# Patient Record
Sex: Female | Born: 1995 | Race: Black or African American | Hispanic: No | Marital: Single | Smoking: Never smoker
Health system: Southern US, Community
[De-identification: ages and names within clinical notes are randomized; demographics above are authoritative.]

## PROBLEM LIST (undated history)

## (undated) ENCOUNTER — Inpatient Hospital Stay (HOSPITAL_COMMUNITY): Payer: Self-pay

## (undated) DIAGNOSIS — L83 Acanthosis nigricans: Secondary | ICD-10-CM

## (undated) DIAGNOSIS — K219 Gastro-esophageal reflux disease without esophagitis: Secondary | ICD-10-CM

## (undated) DIAGNOSIS — O139 Gestational [pregnancy-induced] hypertension without significant proteinuria, unspecified trimester: Secondary | ICD-10-CM

## (undated) DIAGNOSIS — O24419 Gestational diabetes mellitus in pregnancy, unspecified control: Secondary | ICD-10-CM

## (undated) DIAGNOSIS — E669 Obesity, unspecified: Secondary | ICD-10-CM

## (undated) DIAGNOSIS — G43909 Migraine, unspecified, not intractable, without status migrainosus: Secondary | ICD-10-CM

## (undated) DIAGNOSIS — M419 Scoliosis, unspecified: Secondary | ICD-10-CM

## (undated) DIAGNOSIS — R7303 Prediabetes: Secondary | ICD-10-CM

## (undated) DIAGNOSIS — G971 Other reaction to spinal and lumbar puncture: Secondary | ICD-10-CM

## (undated) DIAGNOSIS — Z9109 Other allergy status, other than to drugs and biological substances: Secondary | ICD-10-CM

## (undated) DIAGNOSIS — E119 Type 2 diabetes mellitus without complications: Secondary | ICD-10-CM

## (undated) DIAGNOSIS — I10 Essential (primary) hypertension: Secondary | ICD-10-CM

## (undated) DIAGNOSIS — D1620 Benign neoplasm of long bones of unspecified lower limb: Secondary | ICD-10-CM

## (undated) DIAGNOSIS — E049 Nontoxic goiter, unspecified: Secondary | ICD-10-CM

## (undated) HISTORY — DX: Gastro-esophageal reflux disease without esophagitis: K21.9

## (undated) HISTORY — DX: Gestational (pregnancy-induced) hypertension without significant proteinuria, unspecified trimester: O13.9

## (undated) HISTORY — DX: Prediabetes: R73.03

## (undated) HISTORY — DX: Gestational diabetes mellitus in pregnancy, unspecified control: O24.419

## (undated) HISTORY — DX: Acanthosis nigricans: L83

## (undated) HISTORY — DX: Nontoxic goiter, unspecified: E04.9

## (undated) HISTORY — DX: Type 2 diabetes mellitus without complications: E11.9

## (undated) HISTORY — DX: Essential (primary) hypertension: I10

## (undated) HISTORY — DX: Migraine, unspecified, not intractable, without status migrainosus: G43.909

## (undated) HISTORY — DX: Obesity, unspecified: E66.9

## (undated) HISTORY — DX: Scoliosis, unspecified: M41.9

## (undated) HISTORY — PX: OSTEOCHONDROMA EXCISION: SHX2137

## (undated) HISTORY — DX: Other reaction to spinal and lumbar puncture: G97.1

---
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
## 2013-01-06 ENCOUNTER — Other Ambulatory Visit: Payer: Self-pay | Admitting: Pediatric Endocrinology

## 2013-01-06 ENCOUNTER — Ambulatory Visit: Payer: BC Managed Care – PPO | Admitting: Pediatric Endocrinology

## 2013-01-15 ENCOUNTER — Ambulatory Visit (INDEPENDENT_AMBULATORY_CARE_PROVIDER_SITE_OTHER): Payer: Medicaid Other | Admitting: Family Medicine

## 2013-01-15 ENCOUNTER — Encounter: Payer: Self-pay | Admitting: Family Medicine

## 2013-01-15 VITALS — BP 120/78 | HR 92 | Temp 98.7°F | Wt 209.0 lb

## 2013-01-15 DIAGNOSIS — J029 Acute pharyngitis, unspecified: Secondary | ICD-10-CM

## 2013-01-15 DIAGNOSIS — N898 Other specified noninflammatory disorders of vagina: Secondary | ICD-10-CM

## 2013-01-15 LAB — POCT WET PREP (WET MOUNT): Clue Cells Wet Prep Whiff POC: NEGATIVE

## 2013-01-15 LAB — POCT RAPID STREP A (OFFICE): Rapid Strep A Screen: NEGATIVE

## 2013-01-15 MED ORDER — LORATADINE 10 MG PO TABS: 10.0000 mg | ORAL_TABLET | Freq: Every day | ORAL | Status: DC

## 2013-01-15 MED ORDER — MICONAZOLE NITRATE 2 % VA CREA: 1.0000 | TOPICAL_CREAM | Freq: Every day | VAGINAL | Status: DC

## 2013-01-15 MED ORDER — MICONAZOLE NITRATE 2 % VA CREA: TOPICAL_CREAM | VAGINAL | Status: DC

## 2013-01-15 NOTE — Patient Instructions (Addendum)
It has been a pleasure to see you today.  Please take the medications as prescribed. I will call you with the labs results if they come back abnormal otherwise you will receive a letter. Make a follow appointment as needed.

## 2013-01-15 NOTE — Progress Notes (Signed)
Family Medicine Office Visit Note   Subjective:   Patient ID: Leslie Skinner, female  DOB: Nov 28, 1995, 17 y.o.. MRN: [id number]   Pt that comes today for skin the appointment complaining of vaginal discharge or sore throat.  She's not sexually active per patient's statement but has been on Depo Provera.   Strep throat: Started a couple of days ago, she reports rhinorrhea and congestion, denies fever, chills, malaise, nausea or vomiting. Also denies cough shortness of breath or other lower respiratory symptoms.  Vaginal discharge: Patient has been noticing brown vaginal discharge present on her underwear, ulcer reports burning sensation itchiness and bad odor. Denies dysuria, abdominal/pelvic pain, frequency or flank pain.  Review of Systems:  Per HPI  Objective:   Physical Exam: Gen:  NAD HEENT: Moist mucous membranes Oropharynx: no exudates, mildly erythematous. Neck: supple no adenopathies. Ears with normal TM and ear canal.  CV: Regular rate and rhythm, no murmurs  PULM: Clear to auscultation bilaterally.  ABD: Soft, non tender, non distended, normal bowel sounds EXT: No edema Skin: no rashes.  Vulva and perianal area: Normal, vaginal discharge seen.  Speculum and bimanual exam differed.   Assessment & Plan:

## 2013-01-20 DIAGNOSIS — J029 Acute pharyngitis, unspecified: Secondary | ICD-10-CM | POA: Insufficient documentation

## 2013-01-20 NOTE — Assessment & Plan Note (Signed)
Wet prep obtained form outside vaginal introitus. Negative results No further treatment. Recommended good hygiene.  F/u as needed.

## 2013-01-20 NOTE — Assessment & Plan Note (Signed)
Most likely from viral illness. No exudates, adenopathies or fever. Rapid Strep negative. Symptomatic treatment.  Discussed signs of worsening condition that should prompt re-evaluation. F/u as needed.

## 2013-01-26 ENCOUNTER — Encounter (HOSPITAL_COMMUNITY): Payer: Self-pay | Admitting: Emergency Medicine

## 2013-01-26 ENCOUNTER — Emergency Department (HOSPITAL_COMMUNITY): Payer: Medicaid Other

## 2013-01-26 ENCOUNTER — Emergency Department (HOSPITAL_COMMUNITY)
Admission: EM | Admit: 2013-01-26 | Discharge: 2013-01-27 | Disposition: A | Discharge status: Home / Self Care | Payer: Medicaid Other | Attending: Emergency Medicine | Admitting: Emergency Medicine

## 2013-01-26 DIAGNOSIS — M4120 Other idiopathic scoliosis, site unspecified: Secondary | ICD-10-CM | POA: Insufficient documentation

## 2013-01-26 DIAGNOSIS — W540XXA Bitten by dog, initial encounter: Secondary | ICD-10-CM | POA: Insufficient documentation

## 2013-01-26 DIAGNOSIS — E669 Obesity, unspecified: Secondary | ICD-10-CM | POA: Insufficient documentation

## 2013-01-26 DIAGNOSIS — Z8739 Personal history of other diseases of the musculoskeletal system and connective tissue: Secondary | ICD-10-CM | POA: Insufficient documentation

## 2013-01-26 DIAGNOSIS — L83 Acanthosis nigricans: Secondary | ICD-10-CM | POA: Insufficient documentation

## 2013-01-26 DIAGNOSIS — Z8719 Personal history of other diseases of the digestive system: Secondary | ICD-10-CM | POA: Insufficient documentation

## 2013-01-26 DIAGNOSIS — E049 Nontoxic goiter, unspecified: Secondary | ICD-10-CM | POA: Insufficient documentation

## 2013-01-26 DIAGNOSIS — S61451A Open bite of right hand, initial encounter: Secondary | ICD-10-CM

## 2013-01-26 DIAGNOSIS — Z8669 Personal history of other diseases of the nervous system and sense organs: Secondary | ICD-10-CM | POA: Insufficient documentation

## 2013-01-26 DIAGNOSIS — E1169 Type 2 diabetes mellitus with other specified complication: Secondary | ICD-10-CM | POA: Insufficient documentation

## 2013-01-26 DIAGNOSIS — Z885 Allergy status to narcotic agent status: Secondary | ICD-10-CM | POA: Insufficient documentation

## 2013-01-26 DIAGNOSIS — Y929 Unspecified place or not applicable: Secondary | ICD-10-CM | POA: Insufficient documentation

## 2013-01-26 DIAGNOSIS — Z79899 Other long term (current) drug therapy: Secondary | ICD-10-CM | POA: Insufficient documentation

## 2013-01-26 DIAGNOSIS — K219 Gastro-esophageal reflux disease without esophagitis: Secondary | ICD-10-CM | POA: Insufficient documentation

## 2013-01-26 DIAGNOSIS — I10 Essential (primary) hypertension: Secondary | ICD-10-CM | POA: Insufficient documentation

## 2013-01-26 DIAGNOSIS — IMO0002 Reserved for concepts with insufficient information to code with codable children: Secondary | ICD-10-CM | POA: Insufficient documentation

## 2013-01-26 DIAGNOSIS — Y939 Activity, unspecified: Secondary | ICD-10-CM | POA: Insufficient documentation

## 2013-01-26 MED ORDER — IBUPROFEN 400 MG PO TABS
400.0000 mg | ORAL_TABLET | Freq: Once | ORAL | Status: DC
  Filled 2013-01-26: qty 1

## 2013-01-26 MED ORDER — ACETAMINOPHEN 325 MG PO TABS
650.0000 mg | ORAL_TABLET | Freq: Once | ORAL | Status: AC
  Administered 2013-01-26: 650 mg via ORAL
  Filled 2013-01-26: qty 2

## 2013-01-26 NOTE — ED Notes (Signed)
Pt. reported that she was bitten by a stray dog this morning , presents with swelling/bite mark at right hand .
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
## 2013-01-27 NOTE — ED Provider Notes (Signed)
Medical screening examination/treatment/procedure(s) were performed by non-physician practitioner and as supervising physician I was immediately available for consultation/collaboration.  EKG Interpretation   None        Martha K Linker, MD 01/27/13 1703 

## 2013-01-29 ENCOUNTER — Encounter: Payer: Self-pay | Admitting: Emergency Medicine

## 2013-02-20 ENCOUNTER — Emergency Department (HOSPITAL_COMMUNITY)
Admission: EM | Admit: 2013-02-20 | Discharge: 2013-02-21 | Disposition: A | Discharge status: Home / Self Care | Payer: Medicaid Other | Attending: Emergency Medicine | Admitting: Emergency Medicine

## 2013-02-20 ENCOUNTER — Encounter (HOSPITAL_COMMUNITY): Payer: Self-pay | Admitting: Emergency Medicine

## 2013-02-20 DIAGNOSIS — Z8739 Personal history of other diseases of the musculoskeletal system and connective tissue: Secondary | ICD-10-CM | POA: Insufficient documentation

## 2013-02-20 DIAGNOSIS — Z862 Personal history of diseases of the blood and blood-forming organs and certain disorders involving the immune mechanism: Secondary | ICD-10-CM | POA: Insufficient documentation

## 2013-02-20 DIAGNOSIS — Z3202 Encounter for pregnancy test, result negative: Secondary | ICD-10-CM | POA: Insufficient documentation

## 2013-02-20 DIAGNOSIS — I10 Essential (primary) hypertension: Secondary | ICD-10-CM | POA: Insufficient documentation

## 2013-02-20 DIAGNOSIS — G43909 Migraine, unspecified, not intractable, without status migrainosus: Secondary | ICD-10-CM | POA: Insufficient documentation

## 2013-02-20 DIAGNOSIS — Z87828 Personal history of other (healed) physical injury and trauma: Secondary | ICD-10-CM | POA: Insufficient documentation

## 2013-02-20 DIAGNOSIS — Z872 Personal history of diseases of the skin and subcutaneous tissue: Secondary | ICD-10-CM | POA: Insufficient documentation

## 2013-02-20 DIAGNOSIS — E119 Type 2 diabetes mellitus without complications: Secondary | ICD-10-CM | POA: Insufficient documentation

## 2013-02-20 DIAGNOSIS — E860 Dehydration: Secondary | ICD-10-CM | POA: Insufficient documentation

## 2013-02-20 DIAGNOSIS — E669 Obesity, unspecified: Secondary | ICD-10-CM | POA: Insufficient documentation

## 2013-02-20 DIAGNOSIS — Z79899 Other long term (current) drug therapy: Secondary | ICD-10-CM | POA: Insufficient documentation

## 2013-02-20 DIAGNOSIS — R42 Dizziness and giddiness: Secondary | ICD-10-CM

## 2013-02-20 DIAGNOSIS — K219 Gastro-esophageal reflux disease without esophagitis: Secondary | ICD-10-CM | POA: Insufficient documentation

## 2013-02-20 DIAGNOSIS — Y849 Medical procedure, unspecified as the cause of abnormal reaction of the patient, or of later complication, without mention of misadventure at the time of the procedure: Secondary | ICD-10-CM | POA: Insufficient documentation

## 2013-02-20 DIAGNOSIS — Z8639 Personal history of other endocrine, nutritional and metabolic disease: Secondary | ICD-10-CM | POA: Insufficient documentation

## 2013-02-20 MED ORDER — ACETAMINOPHEN 325 MG PO TABS
650.0000 mg | ORAL_TABLET | Freq: Once | ORAL | Status: AC
  Administered 2013-02-20: 650 mg via ORAL
  Filled 2013-02-20: qty 2

## 2013-02-20 MED ORDER — SODIUM CHLORIDE 0.9 % IV BOLUS (SEPSIS)
1000.0000 mL | Freq: Once | INTRAVENOUS | Status: AC
  Administered 2013-02-20: 1000 mL via INTRAVENOUS

## 2013-02-20 NOTE — ED Notes (Signed)
Pt informed that we need urine, pt sts she can't go right now

## 2013-02-20 NOTE — ED Notes (Signed)
Bed: WA01 Expected date:  Expected time:  Means of arrival:  Comments: EMS/17 yo with weakness-donated plasma earlier today
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
## 2013-02-20 NOTE — ED Notes (Signed)
Donated plasma at 1300 today, felt weak after the donation, went home didn't tell anybody, didn't really eat or drink much. Now feeling dizzy, weakness, and nauseous. Hx of DMT1 and HTN. EMS BP was 120/66 and EMS CBG was 95. Pt is stable

## 2013-02-21 LAB — URINALYSIS, ROUTINE W REFLEX MICROSCOPIC
Bilirubin Urine: NEGATIVE
Hgb urine dipstick: NEGATIVE
Specific Gravity, Urine: 1.011 (ref 1.005–1.030)
pH: 6 (ref 5.0–8.0)

## 2013-02-21 NOTE — ED Provider Notes (Signed)
Medical screening examination/treatment/procedure(s) were performed by non-physician practitioner and as supervising physician I was immediately available for consultation/collaboration.  EKG Interpretation   None         Christopher J. Pollina, MD 02/21/13 1513 

## 2013-02-28 ENCOUNTER — Other Ambulatory Visit: Payer: Self-pay | Admitting: "Endocrinology

## 2013-03-09 ENCOUNTER — Other Ambulatory Visit: Payer: Self-pay | Admitting: *Deleted

## 2013-03-09 DIAGNOSIS — I10 Essential (primary) hypertension: Secondary | ICD-10-CM

## 2013-03-09 MED ORDER — LISINOPRIL 10 MG PO TABS: 10.0000 mg | ORAL_TABLET | Freq: Every day | ORAL | Status: DC

## 2013-03-19 ENCOUNTER — Ambulatory Visit (INDEPENDENT_AMBULATORY_CARE_PROVIDER_SITE_OTHER): Payer: Medicaid Other | Admitting: Family Medicine

## 2013-03-19 ENCOUNTER — Ambulatory Visit (INDEPENDENT_AMBULATORY_CARE_PROVIDER_SITE_OTHER): Payer: Medicaid Other | Admitting: *Deleted

## 2013-03-19 VITALS — BP 126/73 | HR 86 | Temp 99.2°F | Ht 62.0 in | Wt 209.0 lb

## 2013-03-19 DIAGNOSIS — IMO0001 Reserved for inherently not codable concepts without codable children: Secondary | ICD-10-CM

## 2013-03-19 DIAGNOSIS — J029 Acute pharyngitis, unspecified: Secondary | ICD-10-CM

## 2013-03-19 DIAGNOSIS — Z309 Encounter for contraceptive management, unspecified: Secondary | ICD-10-CM

## 2013-03-19 DIAGNOSIS — M542 Cervicalgia: Secondary | ICD-10-CM

## 2013-03-19 DIAGNOSIS — E669 Obesity, unspecified: Secondary | ICD-10-CM

## 2013-03-19 LAB — POCT URINE PREGNANCY: Preg Test, Ur: NEGATIVE

## 2013-03-19 MED ORDER — MEDROXYPROGESTERONE ACETATE 150 MG/ML IM SUSP
150.0000 mg | Freq: Once | INTRAMUSCULAR | Status: AC
  Administered 2013-03-19: 150 mg via INTRAMUSCULAR

## 2013-03-19 MED ORDER — IPRATROPIUM BROMIDE 0.06 % NA SOLN: 2.0000 | Freq: Four times a day (QID) | NASAL | Status: DC

## 2013-03-19 NOTE — Assessment & Plan Note (Signed)
Subacute (2 weeks), left sided; Pt's vitals are stable, she is afebrile, and no stridor or upper respiratory obstruction was noted on exam. She had full neck ROM with no stiffness. Symptoms most severe at left trapezius with tenderness and difficulty shrugging shoulder, with no trauma but reported increased lifting load recently at work. Likely MSK strain, throat pain likely URI. Mild occasional numbness limited to shoulder. - Recommended ibuprofen 4-600mg  3-4 times daily with food. - Rest, ice/heat therapy, provided note to minimize lifting at work x1 week. - Return precautions reviewed. - F/u in 2 week if no better

## 2013-03-19 NOTE — Assessment & Plan Note (Signed)
Likely contributing to difficulty breathing at night that seems mild, not waking pt from sleep. - F/u with PCP. - Suggested sleep study but deferring to PCP. - Recommended weight loss for DM and HTN. - Sleep with 1 extra pillow to minimize pressure from redundant o/p tissue.

## 2013-03-19 NOTE — Progress Notes (Signed)
Patient in today for depo, patient a few weeks late so urine pregnancy test obtained with negative results. Injection given in the right ventrogluteal, patient without complaints, site unremarkable. Next depo due March 26 - April 9, patient aware.

## 2013-03-19 NOTE — Assessment & Plan Note (Signed)
With mild symptoms of cough and sneeze, increased rhinorrhea, and throat pain, likely URI vs allergic symptoms. No hot potato voice, no LAD, no drooling. - Chrloraseptic or atrovent nasal spray for postnasal drip PRN. - Return precautions reviewed especially difficulty breathing. - F/u 1 week if no better.

## 2013-03-19 NOTE — Patient Instructions (Addendum)
It was good to meet you today.  Your symptoms may be due to both straining at work and having a cold.  - You can try atrovent nasal spray for postnasal drip symtoms. - You can try chloraseptic throat spray as well for sore throat. - If you start having difficulty breathing, high fevers, or worsened symptoms, seek immediate care. - Follow up if you are not feeling better in 1 week. - Try 1 extra pillow to help with breathing symptoms at night. - Weight loss will also help as there may be more tissue around the neck. - I recommend a sleep study to evaluate for sleep apnea. Discuss this with your PCP. - F/u with Dr Bridgett Larsson to follow up on your high BP and diabetes. - Try to take it easy at work. - Take ibuprofen 4-600mg  3-4 times daily with food for your shoulder pain and rest. Follow up in 2-3 weeks.  Muscle Strain Muscle strain occurs when a muscle is stretched beyond its normal length. A small number of muscle fibers generally are torn. This is especially common in athletes. This happens when a sudden, violent force placed on a muscle stretches it too far. Usually, recovery from muscle strain takes 1 to 2 weeks. Complete healing will take 5 to 6 weeks.  HOME CARE INSTRUCTIONS   While awake, apply ice to the sore muscle for the first 2 days after the injury.  Put ice in a plastic bag.  Place a towel between your skin and the bag.  Leave the ice on for 15-20 minutes each hour.  Do not use the strained muscle for several days, until you no longer have pain.  You may wrap the injured area with an elastic bandage for comfort. Be careful not to wrap it too tightly. This may interfere with blood circulation or increase swelling.  Only take over-the-counter or prescription medicines for pain, discomfort, or fever as directed by your caregiver. SEEK MEDICAL CARE IF:  You have increasing pain or swelling in the injured area. MAKE SURE YOU:   Understand these instructions.  Will watch your  condition.  Will get help right away if you are not doing well or get worse. Document Released: 02/26/2005 Document Revised: 05/21/2011 Document Reviewed: 03/10/2011 [facility] Patient Information 2014 [place], [place].

## 2013-03-19 NOTE — Progress Notes (Signed)
Patient ID: Leslie Skinner, female   DOB: 1995/03/27, 18 y.o.   MRN: [id number] Subjective:   CC: Swollen glands  HPI:   Patient presents today to same-day clinic with complaint of swollen glands, throat pain, and trouble swallowing for 2 weeks. She is able to tolerate PO and is not drooling. She also reports that her right eyebrow occasionally swells and this has been going on a few weeks too. 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 She has also had increased rhinorrhea lately and complains of allergies acting up. She has some coughing/sneezing and at night has a hard time breathing. She has not seen anyone for this complaint.  She denies fevers, chills, stiff neck, purlence/pus, change over the past 2 weeks in symptoms, shortness of breath, waking up unable to breathe, rash, nausea, vomiting, diarrhea, chest pain, or sick contacts. She has also not recently tried new foods or had new medications.  Review of Systems - Per HPI.   PMH: - HTN - DM - Acid reflux - Allergies  SH: - works at [employer] doing heavy lifting  Meds: - Loratadine - Lisinopril - Metformin - "Acid reflux medications"    Objective:  Physical Exam BP 126/73  Pulse 86  Temp(Src) 99.2 F (37.3 C) (Oral)  Ht 5\' 2"  (1.575 m)  Wt 209 lb (94.802 kg)  BMI 38.22 kg/m2 GEN: NAD HEENT: Atraumatic, normocephalic, neck supple, EOMI, sclera clear, propharynx clear, no swelling or tonsillar hypertrophy, no exudate, no stridor, no drooling, voice mildly husky-sounding Lymph: No anterior/posterior cervical, submandibular or submental LAD, mild tenderness anterior throat, no fullness PULM: CTAB, normal effort ABD: Obese SKIN: No rash or cyanosis; warm and well-perfused EXTR: No lower extremity edema or calf tenderness; Left upper trapezius tender to palpation and attempted shrugging of  shoulders; no erythema, swelling, deformity. Strength upper extremities 5/5 with full shoulder abduction. PSYCH: Mood and affect euthymic, normal rate and volume of speech NEURO: Awake, alert, no focal deficits grossly, normal speech, CN 2-12 intact grossly  Assessment:     Leslie Skinner is a 18 y.o. female here for "swollen glands."    Plan:     # See problem list and after visit summary for problem-specific plans.   # Health Maintenance: Urged patient to return for follow-up of DM and HTN regularly with Leslie Skinner.   Follow-up: Follow up in 1 week if symptoms are not improving.   Leslie Sinclair, MD [place]

## 2013-03-30 ENCOUNTER — Other Ambulatory Visit: Payer: Self-pay | Admitting: Pediatric Endocrinology

## 2013-04-04 ENCOUNTER — Other Ambulatory Visit: Payer: Self-pay | Admitting: Emergency Medicine

## 2013-05-03 ENCOUNTER — Other Ambulatory Visit: Payer: Self-pay | Admitting: Pediatric Endocrinology

## 2013-05-17 ENCOUNTER — Encounter (HOSPITAL_COMMUNITY): Payer: Self-pay | Admitting: Emergency Medicine

## 2013-05-17 ENCOUNTER — Emergency Department (INDEPENDENT_AMBULATORY_CARE_PROVIDER_SITE_OTHER): Payer: Medicaid Other

## 2013-05-17 ENCOUNTER — Emergency Department (INDEPENDENT_AMBULATORY_CARE_PROVIDER_SITE_OTHER)
Admission: EM | Admit: 2013-05-17 | Discharge: 2013-05-17 | Disposition: A | Discharge status: Home / Self Care | Payer: Medicaid Other | Source: Home / Self Care | Attending: Emergency Medicine | Admitting: Emergency Medicine

## 2013-05-17 DIAGNOSIS — S6390XA Sprain of unspecified part of unspecified wrist and hand, initial encounter: Secondary | ICD-10-CM

## 2013-05-17 DIAGNOSIS — S63601A Unspecified sprain of right thumb, initial encounter: Secondary | ICD-10-CM

## 2013-05-17 DIAGNOSIS — T23019A Burn of unspecified degree of unspecified thumb (nail), initial encounter: Secondary | ICD-10-CM

## 2013-05-17 DIAGNOSIS — T23011A Burn of unspecified degree of right thumb (nail), initial encounter: Secondary | ICD-10-CM

## 2013-05-17 MED ORDER — NAPROXEN 500 MG PO TABS: 500.0000 mg | ORAL_TABLET | Freq: Two times a day (BID) | ORAL | Status: DC

## 2013-05-17 MED ORDER — CAPSAICIN 0.025 % EX CREA: TOPICAL_CREAM | Freq: Two times a day (BID) | CUTANEOUS | Status: DC

## 2013-05-17 NOTE — ED Provider Notes (Signed)
Chief Complaint    Chief Complaint  Patient presents with  . Hand Problem    History of Present Illness     Leslie Skinner is a 18 year old female who 2 weeks ago while at work. The tip of her right thumb on a hot glue gun. She jerked the thumb away from the glue gun and noted a popping sensation at the MCP joint. She's been treating the burn herself at home when it is completely healed, but the tip of the finger still swollen, painful, tender, numb, and tingly, and burns. She also has aching over the inter phalangeal and MCP joints. There is no swelling of these joints. She has a limited range of motion with pain.  Review of Systems     Other than as noted above, the patient denies any of the following symptoms: Systemic:  No fevers, chills, sweats, or muscle aches.  No weight loss.  Musculoskeletal:  No joint pain, arthritis, bursitis, swelling, back pain, or neck pain. Neurological:  No muscular weakness, paresthesias, headache, or trouble with speech or coordination.  No dizziness.  [place]    Past medical history, family history, social history, meds, and allergies were reviewed.  She is allergic to morphine. She has diabetes, hypertension, GERD, headaches, and lower back pain. She takes metformin, Topamax, lisinopril, naproxen, Urecholine, and Prevacid.  Physical Exam    Vital signs:  BP 140/86  Pulse 95  Temp(Src) 98.7 F (37.1 C) (Oral)  Resp 18  SpO2 100% Gen:  Alert and oriented times 3.  In no distress. Musculoskeletal: Exam of the hand reveals swelling of the tip of the thumb. The burn is completely healed up. It's still tender to touch. The [facility] PIP joint have a decreased range of motion with pain. There is pain to palpation of both of these joints. She doesn't have much muscle strength in that hand.  Otherwise, all joints had a full a ROM with no swelling, bruising or deformity.  No edema, pulses full. Extremities were warm and pink.  Capillary refill was brisk.  Skin:   Clear, warm and dry.  No rash. Neuro:  Alert and oriented times 3.  Muscle strength was normal.  Sensation was intact to light touch.    Radiology     Dg Finger Thumb Right  05/17/2013   CLINICAL DATA:  Right thumb burn.  EXAM: RIGHT THUMB 2+V  COMPARISON:  None.  FINDINGS: There is no evidence of fracture or dislocation. There is no evidence of arthropathy or other focal bone abnormality. Soft tissues are unremarkable  IMPRESSION: Negative.   Electronically Signed   By: Rolm Baptise M.D.   On: 05/17/2013 12:53   I reviewed the images independently and personally and concur with the radiologist's findings.  Course in [facility]gent [place] in a thumb spica splint.  Assessment    The primary encounter diagnosis was Burn of right thumb. A diagnosis of Sprain of right thumb was also pertinent to this visit.  The burn is completely healed up, but there seems to be some residual inflammation. I have recommended that she use capsaicin cream, since some of this may be neuropathic pain. I've advised that she followup with a hand specialist. There's a possibility that she has a radial or ulnar collateral ligament sprain or tear. Examination was difficult today because she was in so much pain.  Plan   1.  Meds:  The following meds were prescribed:   Discharge Medication List as of  05/17/2013  1:17 PM    START taking these medications   Details  capsaicin (ZOSTRIX) 0.025 % cream Apply topically 2 (two) times daily., Starting 05/17/2013, Until Discontinued, Normal    naproxen (NAPROSYN) 500 MG tablet Take 1 tablet (500 mg total) by mouth 2 (two) times daily., Starting 05/17/2013, Until Discontinued, Normal        2.  Patient Education/Counseling:  The patient was given appropriate handouts, self care instructions, and instructed in symptomatic relief, including rest and activity, elevation, application of ice and compression.    3.  Follow up:  The patient was told to follow up here if no  better in 3 to 4 days, or sooner if becoming worse in any way, and given some red flag symptoms such as worsening pain or new neurological symptoms which would prompt immediate return.  Follow up here as needed. Followup with Dr. Lenon Curt in 2 weeks.     Harden Mo, MD 05/17/13 [phone]

## 2013-05-17 NOTE — Discharge Instructions (Signed)
Thumb Sprain Your exam shows you have a sprained thumb. This means the ligaments around the joint have been torn. Thumb sprains usually take 3-6 weeks to heal. However, severe, unstable sprains may need to be fixed surgically. Sometimes a small piece of bone is pulled off by the ligament. If this is not treated properly, a sprained thumb can lead to a painful, weak joint. Treatment helps reduce pain and shortens the period of disability. The thumb, and often the wrist, must remain splinted for the first 2-4 weeks to protect the joint. Keep your hand elevated and apply ice packs frequently to the injured area (20-30 minutes every 2-3 hours) for the next 2-4 days. This helps reduce swelling and control pain. Pain medicine may also be used for several days. Motion and strengthening exercises may later be prescribed for the joint to return to normal function. Be sure to see your doctor for follow-up because your thumb joint may require further support with splints, bandages or tape. Please see your doctor or go to the emergency room right away if you have increased pain despite proper treatment, or a numb, cold, or pale thumb. Document Released: 04/05/2004 Document Revised: 05/21/2011 Document Reviewed: 02/28/2008 [facility] Patient Information 2014 [place].
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
## 2013-06-13 ENCOUNTER — Other Ambulatory Visit: Payer: Self-pay | Admitting: Family

## 2013-06-19 ENCOUNTER — Emergency Department (INDEPENDENT_AMBULATORY_CARE_PROVIDER_SITE_OTHER): Payer: Medicaid Other

## 2013-06-19 ENCOUNTER — Emergency Department (INDEPENDENT_AMBULATORY_CARE_PROVIDER_SITE_OTHER)
Admission: EM | Admit: 2013-06-19 | Discharge: 2013-06-19 | Disposition: A | Discharge status: Home / Self Care | Payer: Medicaid Other | Source: Home / Self Care | Attending: Emergency Medicine | Admitting: Emergency Medicine

## 2013-06-19 ENCOUNTER — Encounter (HOSPITAL_COMMUNITY): Payer: Self-pay | Admitting: Emergency Medicine

## 2013-06-19 DIAGNOSIS — J209 Acute bronchitis, unspecified: Secondary | ICD-10-CM

## 2013-06-19 DIAGNOSIS — J069 Acute upper respiratory infection, unspecified: Secondary | ICD-10-CM

## 2013-06-19 DIAGNOSIS — J019 Acute sinusitis, unspecified: Secondary | ICD-10-CM

## 2013-06-19 DIAGNOSIS — J309 Allergic rhinitis, unspecified: Secondary | ICD-10-CM

## 2013-06-19 LAB — POCT I-STAT, CHEM 8
BUN: 13 mg/dL (ref 6–23)
CHLORIDE: 106 meq/L (ref 96–112)
CREATININE: 1 mg/dL (ref 0.50–1.10)
Calcium, Ion: 1.23 mmol/L (ref 1.12–1.23)
Glucose, Bld: 89 mg/dL (ref 70–99)
HEMATOCRIT: 44 % (ref 36.0–46.0)
Hemoglobin: 15 g/dL (ref 12.0–15.0)
POTASSIUM: 3.5 meq/L — AB (ref 3.7–5.3)
Sodium: 144 mEq/L (ref 137–147)
TCO2: 22 mmol/L (ref 0–100)

## 2013-06-19 MED ORDER — AMOXICILLIN-POT CLAVULANATE 875-125 MG PO TABS: 1.0000 | ORAL_TABLET | Freq: Two times a day (BID) | ORAL | Status: DC

## 2013-06-19 MED ORDER — IPRATROPIUM-ALBUTEROL 0.5-2.5 (3) MG/3ML IN SOLN
RESPIRATORY_TRACT | Status: AC
  Filled 2013-06-19: qty 3

## 2013-06-19 MED ORDER — ALBUTEROL SULFATE HFA 108 (90 BASE) MCG/ACT IN AERS: 2.0000 | INHALATION_SPRAY | Freq: Four times a day (QID) | RESPIRATORY_TRACT | Status: DC

## 2013-06-19 MED ORDER — TRAMADOL HCL 50 MG PO TABS: 100.0000 mg | ORAL_TABLET | Freq: Three times a day (TID) | ORAL | Status: DC | PRN

## 2013-06-19 MED ORDER — ALBUTEROL SULFATE (2.5 MG/3ML) 0.083% IN NEBU
INHALATION_SOLUTION | RESPIRATORY_TRACT | Status: AC
  Filled 2013-06-19: qty 3

## 2013-06-19 MED ORDER — IPRATROPIUM-ALBUTEROL 0.5-2.5 (3) MG/3ML IN SOLN
3.0000 mL | Freq: Once | RESPIRATORY_TRACT | Status: AC
  Administered 2013-06-19: 3 mL via RESPIRATORY_TRACT

## 2013-06-19 MED ORDER — IPRATROPIUM BROMIDE 0.06 % NA SOLN: 2.0000 | Freq: Four times a day (QID) | NASAL | Status: DC

## 2013-06-19 MED ORDER — ALBUTEROL SULFATE (2.5 MG/3ML) 0.083% IN NEBU
5.0000 mg | INHALATION_SOLUTION | Freq: Once | RESPIRATORY_TRACT | Status: AC
  Administered 2013-06-19: 5 mg via RESPIRATORY_TRACT

## 2013-06-19 NOTE — ED Provider Notes (Signed)
Chief Complaint   Chief Complaint  Patient presents with  . URI    History of Present Illness   Leslie Skinner is a 18 year old female with diabetes and hypertension who has had a one-week history of nasal congestion with clear rhinorrhea, sinus pressure, sinus pain, headache, and ear congestion. She's had dry cough, her chest feels tight, and she said aching in her chest. She's also had subjective fever, sore throat, and diarrhea. She has a history of year-round allergies. She has allergy symptoms including sneezing, itchy nose, itchy, watery eyes.  Review of Systems   Other than as noted above, the patient denies any of the following symptoms: Systemic:  No fevers, chills, sweats, or myalgias. Eye:  No redness or discharge. ENT:  No ear pain, headache, nasal congestion, drainage, sinus pressure, or sore throat. Neck:  No neck pain, stiffness, or swollen glands. Lungs:  No cough, sputum production, hemoptysis, wheezing, chest tightness, shortness of breath or chest pain. GI:  No abdominal pain, nausea, vomiting or diarrhea.  [place]   Past medical history, family history, social history, meds, and allergies were reviewed. She is allergic to morphine. She has diabetes and hypertension. She takes loratadine and a nasal spray for her sinus symptoms and metformin and lisinopril for her diabetes and blood pressure respectively.  Physical exam   Vital signs:  BP 136/84  Pulse 113  Temp(Src) 98.6 F (37 C) (Oral)  Resp 16  SpO2 100% General:  Alert and oriented.  In no distress.  Skin warm and dry. Her nose is very congested and she is sniffing constantly. Eye:  No conjunctival injection or drainage. Lids were normal. ENT:  TMs and canals were normal, without erythema or inflammation.  Nasal mucosa was clear and uncongested, without drainage.  Mucous membranes were moist.  Pharynx was clear with no exudate or drainage.  There were no oral ulcerations or lesions. Neck:  Supple, no  adenopathy, tenderness or mass. Lungs:  No respiratory distress.  Lungs were clear to auscultation, without wheezes, rales or rhonchi.  Breath sounds were clear and equal bilaterally.  Heart:  Regular rhythm, without gallops, murmers or rubs. Skin:  Clear, warm, and dry, without rash or lesions.  Labs   Results for orders placed during the hospital encounter of 06/19/13  POCT I-STAT, CHEM 8      Result Value Ref Range   Sodium 144  137 - 147 mEq/L   Potassium 3.5 (*) 3.7 - 5.3 mEq/L   Chloride 106  96 - 112 mEq/L   BUN 13  6 - 23 mg/dL   Creatinine, Ser 1.00  0.50 - 1.10 mg/dL   Glucose, Bld 89  70 - 99 mg/dL   Calcium, Ion 1.23  1.12 - 1.23 mmol/L   TCO2 22  0 - 100 mmol/L   Hemoglobin 15.0  12.0 - 15.0 g/dL   HCT 44.0  36.0 - 46.0 %     Radiology   Dg Chest 2 View  06/19/2013   CLINICAL DATA:  Cough and chest tightness  EXAM: CHEST  2 VIEW  COMPARISON:  DG CHEST 2 VIEW dated 08/20/2012  FINDINGS: Cardiomediastinal silhouette is unremarkable. The lungs are clear without pleural effusions or focal consolidations. Trachea projects midline and there is no pneumothorax. Soft tissue planes and included osseous structures are non-suspicious.  IMPRESSION: No acute cardiopulmonary process ; normal chest radiograph.   Electronically Signed   By: Elon Alas   On: 06/19/2013 20:00   [facility]   Given a DuoNeb breathing treatment and felt better after that. Her lung sounds are clear both before and after the treatment.  Assessment     The primary encounter diagnosis was Allergic rhinitis. Diagnoses of Viral upper respiratory infection, Acute bronchitis, and Acute sinusitis were also pertinent to this visit.  Plan    1.  Meds:  The following meds were prescribed:   Discharge Medication List as of 06/19/2013  8:28 PM    START taking these medications   Details  albuterol (PROVENTIL HFA;VENTOLIN HFA) 108 (90 BASE) MCG/ACT inhaler Inhale 2 puffs into the lungs 4  (four) times daily., Starting 06/19/2013, Until Discontinued, Normal    amoxicillin-clavulanate (AUGMENTIN) 875-125 MG per tablet Take 1 tablet by mouth 2 (two) times daily., Starting 06/19/2013, Until Discontinued, Normal    !! ipratropium (ATROVENT) 0.06 % nasal spray Place 2 sprays into both nostrils 4 (four) times daily., Starting 06/19/2013, Until Discontinued, Normal    traMADol (ULTRAM) 50 MG tablet Take 2 tablets (100 mg total) by mouth every 8 (eight) hours as needed., Starting 06/19/2013, Until Discontinued, Normal     !! - Potential duplicate medications found. Please discuss with provider.      2.  Patient Education/Counseling:  The patient was given appropriate handouts, self care instructions, and instructed in symptomatic relief.  Instructed to get extra fluids, rest, and use a cool mist vaporizer.    3.  Follow up:  The patient was told to follow up here if no better in 3 to 4 days, or sooner if becoming worse in any way, and given some red flag symptoms such as increasing fever, difficulty breathing, chest pain, or persistent vomiting which would prompt immediate return.  Follow up here as needed.      Harden Mo, MD 06/19/13 2135

## 2013-06-19 NOTE — ED Notes (Signed)
Pt c/o cold sxs onset 1 week sxs include: productive cough, HA, facial pressure, runny nose Denies f/v/n/d Taking tyle, loratadine and using her nasal spray w/no relief Alert w/no signs of acute distress.

## 2013-06-19 NOTE — Discharge Instructions (Signed)
Most upper respiratory infections are caused by viruses and do not require antibiotics.  We try to save the antibiotics for when we really need them to prevent bacteria from developing resistance to them.  Here are a few hints about things that can be done at home to help get over an upper respiratory infection quicker: ° °Get extra sleep and extra fluids.  Get 7 to 9 hours of sleep per night and 6 to 8 glasses of water a day.  Getting extra sleep keeps the immune system from getting run down.  Most people with an upper respiratory infection are a little dehydrated.  The extra fluids also keep the secretions liquified and easier to deal with.  Also, get extra vitamin C.  4000 mg per day is the recommended dose. °For the aches, headache, and fever, acetaminophen or ibuprofen are helpful.  These can be alternated every 4 hours.  People with liver disease should avoid large amounts of acetaminophen, and people with ulcer disease, gastroesophageal reflux, gastritis, congestive heart failure, chronic kidney disease, coronary artery disease and the elderly should avoid ibuprofen. °For nasal congestion try Mucinex-D, or if you're having lots of sneezing or clear nasal drainage use Zyrtec-D. People with high blood pressure can take these if their blood pressure is controlled, if not, it's best to avoid the forms with a "D" (decongestants).  You can use the plain Mucinex, Allegra, Claritin, or Zyrtec even if your blood pressure is not controlled.   °A Saline nasal spray such as Ocean Spray can also help.  You can add a decongestant sprays such as Afrin, but you should not use the decongestant sprays for more than 3 or 4 days since they can be habituating.  Breathe Rite nasal strips can also offer a non-drug alternative treatment to nasal congestion, especially at night. °For people with symptoms of sinusitis, sleeping with your head elevated can be helpful.  For sinus pain, moist, hot compresses to the face may provide some  relief.  Many people find that inhaling steam as in a shower or from a pot of steaming water can help. °For any viral infection, zinc containing lozenges such as Cold-Eze or Zicam are helpful.  Zinc helps to fight viral infection.  Hot salt water gargles (8 oz of hot water, 1/2 tsp of table salt, and a pinch of baking soda) can give relief as well as hot beverages such as hot tea.  Sucrets extra strength lozenges will help the sore throat.  °For the cough, take Delsym 2 tsp every 12 hours.  It has also been found recently that Aleve can help control a cough.  The dose is 1 to 2 tablets twice daily with food.  This can be combined with Delsym. (Note, if you are taking ibuprofen, you should not take Aleve as well--take one or the other.) °A cool mist vaporizer will help keep your mucous membranes from drying out.  ° °It's important when you have an upper respiratory infection not to pass the infection to others.  This involves being very careful about the following: ° °Frequent hand washing or use of hand sanitizer, especially after coughing, sneezing, blowing your nose or touching your face, nose or eyes. °Do not shake hands or touch anyone and try to avoid touching surfaces that other people use such as doorknobs, shopping carts, telephones and computer keyboards. °Use tissues and dispose of them properly in a garbage can or ziplock bag. °Cough into your sleeve. °Do not let others eat or   drink after you. ° °It's also important to recognize the signs of serious illness and get evaluated if they occur: °Any respiratory infection that lasts more than 7 to 10 days.  Yellow nasal drainage and sputum are not reliable indicators of a bacterial infection, but if they last for more than 1 week, see your doctor. °Fever and sore throat can indicate strep. °Fever and cough can indicate influenza or pneumonia. °Any kind of severe symptom such as difficulty breathing, intractable vomiting, or severe pain should prompt you to see  a doctor as soon as possible. ° ° °Your body's immune system is really the thing that will get rid of this infection.  Your immune system is comprised of 2 types of specialized cells called T cells and B cells.  T cells coordinate the array of cells in your body that engulf invading bacteria or viruses while B cells orchestrate the production of antibodies that neutralize infection.  Anything we do or any medications we give you, will just strengthen your immune system or help it clear up the infection quicker.  Here are a few helpful hints to improve your immune system to help overcome this illness or to prevent future infections: °· A few vitamins can improve the health of your immune system.  That's why your diet should include plenty of fruits, vegetables, fish, nuts, and whole grains. °· Vitamin A and bet-carotene can increase the cells that fight infections (T cells and B cells).  Vitamin A is abundant in dark greens and orange vegetables such as spinach, greens, sweet potatoes, and carrots. °· Vitamin B6 contributes to the maturation of white blood cells, the cells that fight disease.  Foods with vitamin B6 include cold cereal and bananas. °· Vitamin C is credited with preventing colds because it increases white blood cells and also prevents cellular damage.  Citrus fruits, peaches and green and red bell peppers are all hight in vitamin C. °· Vitamin E is an anti-oxidant that encourages the production of natural killer cells which reject foreign invaders and B cells that produce antibodies.  Foods high in vitamin E include wheat germ, nuts and seeds. °· Foods high in omega-3 fatty acids found in foods like salmon, tuna and mackerel boost your immune system and help cells to engulf and absorb germs. °· Probiotics are good bacteria that increase your T cells.  These can be found in yogurt and are available in supplements such as Culturelle or Align. °· Moderate exercise increases the strength of your immune  system and your ability to recover from illness.  I suggest 3 to 5 moderate intensity 30 minute workouts per week.   °· Sleep is another component of maintaining a strong immune system.  It enables your body to recuperate from the day's activities, stress and work.  My recommendation is to get between 7 and 9 hours of sleep per night. °· If you smoke, try to quit completely or at least cut down.  Drink alcohol only in moderation if at all.  No more than 2 drinks daily for men or 1 for women. °· Get a flu vaccine early in the fall or if you have not gotten one yet, once this illness has run its course.  If you are over 65, a smoker, or an asthmatic, get a pneumococcal vaccine. °· My final recommendation is to maintain a healthy weight.  Excess weight can impair the immune system by interfering with the way the immune system deals with invading viruses or   bacteria.   How to Use an Inhaler Proper inhaler technique is very important. Good technique ensures that the medicine reaches the lungs. Poor technique results in depositing the medicine on the tongue and back of the throat rather than in the airways. If you do not use the inhaler with good technique, the medicine will not help you. STEPS TO FOLLOW IF USING AN INHALER WITHOUT AN EXTENSION TUBE 1. Remove the cap from the inhaler. 2. If you are using the inhaler for the first time, you will need to prime it. Shake the inhaler for 5 seconds and release four puffs into the air, away from your face. Ask your health care provider or pharmacist if you have questions about priming your inhaler. 3. Shake the inhaler for 5 seconds before each breath in (inhalation). 4. Position the inhaler so that the top of the canister faces up. 5. Put your index finger on the top of the medicine canister. Your thumb supports the bottom of the inhaler. 6. Open your mouth. 7. Either place the inhaler between your teeth and place your lips tightly around the mouthpiece, or hold  the inhaler 1 2 inches away from your open mouth. If you are unsure of which technique to use, ask your health care provider. 8. Breathe out (exhale) normally and as completely as possible. 9. Press the canister down with your index finger to release the medicine. 10. At the same time as the canister is pressed, inhale deeply and slowly until your lungs are completely filled. This should take 4 6 seconds. Keep your tongue down. 11. Hold the medicine in your lungs for 5 10 seconds (10 seconds is best). This helps the medicine get into the small airways of your lungs. 12. Breathe out slowly, through pursed lips. Whistling is an example of pursed lips. 13. Wait at least 15 30 seconds between puffs. Continue with the above steps until you have taken the number of puffs your health care provider has ordered. Do not use the inhaler more than your health care provider tells you. 14. Replace the cap on the inhaler. 15. Follow the directions from your health care provider or the inhaler insert for cleaning the inhaler. STEPS TO FOLLOW IF USING AN INHALER WITH AN EXTENSION (SPACER) 1. Remove the cap from the inhaler. 2. If you are using the inhaler for the first time, you will need to prime it. Shake the inhaler for 5 seconds and release four puffs into the air, away from your face. Ask your health care provider or pharmacist if you have questions about priming your inhaler. 3. Shake the inhaler for 5 seconds before each breath in (inhalation). 4. Place the open end of the spacer onto the mouthpiece of the inhaler. 5. Position the inhaler so that the top of the canister faces up and the spacer mouthpiece faces you. 6. Put your index finger on the top of the medicine canister. Your thumb supports the bottom of the inhaler and the spacer. 7. Breathe out (exhale) normally and as completely as possible. 8. Immediately after exhaling, place the spacer between your teeth and into your mouth. Close your lips tightly  around the spacer. 9. Press the canister down with your index finger to release the medicine. 10. At the same time as the canister is pressed, inhale deeply and slowly until your lungs are completely filled. This should take 4 6 seconds. Keep your tongue down and out of the way. 11. Hold the medicine in your lungs for  5 10 seconds (10 seconds is best). This helps the medicine get into the small airways of your lungs. Exhale. 12. Repeat inhaling deeply through the spacer mouthpiece. Again hold that breath for up to 10 seconds (10 seconds is best). Exhale slowly. If it is difficult to take this second deep breath through the spacer, breathe normally several times through the spacer. Remove the spacer from your mouth. 13. Wait at least 15 30 seconds between puffs. Continue with the above steps until you have taken the number of puffs your health care provider has ordered. Do not use the inhaler more than your health care provider tells you. 14. Remove the spacer from the inhaler, and place the cap on the inhaler. 15. Follow the directions from your health care provider or the inhaler insert for cleaning the inhaler and spacer. If you are using different kinds of inhalers, use your quick relief medicine to open the airways 10 15 minutes before using a steroid if instructed to do so by your health care provider. If you are unsure which inhalers to use and the order of using them, ask your health care provider, nurse, or respiratory therapist. If you are using a steroid inhaler, always rinse your mouth with water after your last puff, then gargle and spit out the water. Do not swallow the water. AVOID:  Inhaling before or after starting the spray of medicine. It takes practice to coordinate your breathing with triggering the spray.  Inhaling through the nose (rather than the mouth) when triggering the spray. HOW TO DETERMINE IF YOUR INHALER IS FULL OR NEARLY EMPTY You cannot know when an inhaler is empty  by shaking it. A few inhalers are now being made with dose counters. Ask your health care provider for a prescription that has a dose counter if you feel you need that extra help. If your inhaler does not have a counter, ask your health care provider to help you determine the date you need to refill your inhaler. Write the refill date on a calendar or your inhaler canister. Refill your inhaler 7 10 days before it runs out. Be sure to keep an adequate supply of medicine. This includes making sure it is not expired, and that you have a spare inhaler.  SEEK MEDICAL CARE IF:   Your symptoms are only partially relieved with your inhaler.  You are having trouble using your inhaler.  You have some increase in phlegm. SEEK IMMEDIATE MEDICAL CARE IF:   You feel little or no relief with your inhalers. You are still wheezing and are feeling shortness of breath or tightness in your chest or both.  You have dizziness, headaches, or a fast heart rate.  You have chills, fever, or night sweats.  You have a noticeable increase in phlegm production, or there is blood in the phlegm. MAKE SURE YOU:   Understand these instructions.  Will watch your condition.  Will get help right away if you are not doing well or get worse. Document Released: 02/24/2000 Document Revised: 12/17/2012 Document Reviewed: 09/25/2012 [facility] Patient Information 2014 [place], [place].

## 2013-06-26 ENCOUNTER — Emergency Department (INDEPENDENT_AMBULATORY_CARE_PROVIDER_SITE_OTHER)
Admission: EM | Admit: 2013-06-26 | Discharge: 2013-06-26 | Disposition: A | Discharge status: Home / Self Care | Payer: Medicaid Other | Source: Home / Self Care

## 2013-06-26 ENCOUNTER — Encounter (HOSPITAL_COMMUNITY): Payer: Self-pay | Admitting: Emergency Medicine

## 2013-06-26 DIAGNOSIS — S90129A Contusion of unspecified lesser toe(s) without damage to nail, initial encounter: Secondary | ICD-10-CM

## 2013-06-26 DIAGNOSIS — S90212A Contusion of left great toe with damage to nail, initial encounter: Secondary | ICD-10-CM

## 2013-06-26 HISTORY — DX: Other allergy status, other than to drugs and biological substances: Z91.09

## 2013-06-26 MED ORDER — IBUPROFEN 600 MG PO TABS: 600.0000 mg | ORAL_TABLET | Freq: Four times a day (QID) | ORAL | Status: DC | PRN

## 2013-06-26 NOTE — ED Notes (Signed)
Pt. refused to let Dr. Marily Memos put a hole in her nail for release the pressure.
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
## 2013-06-26 NOTE — ED Notes (Signed)
R toenail fell off yesterday.  States its the 6 th time  6 yrs.  L toenail is loose. It also has fallen off 6 times in 6 years.  Denies any injury to either nail.

## 2013-06-26 NOTE — Discharge Instructions (Signed)
You have a blood clot under your toenail. This happened because of trauma to the toenail from your tight shoes. Please wear shoes that are open toed, sandels, or have a wide toe box.  Please follow up with your regular doctor.  The pain may be relieved by draining the blood by a doctor.

## 2013-06-27 NOTE — ED Provider Notes (Signed)
Medical screening examination/treatment/procedure(s) were performed by a resident physician or non-physician practitioner and as the supervising physician I was immediately available for consultation/collaboration.  Lynne Leader, MD   Gregor Hams, MD 06/27/13 [phone]

## 2013-07-02 ENCOUNTER — Other Ambulatory Visit: Payer: Self-pay | Admitting: Family

## 2013-07-03 ENCOUNTER — Ambulatory Visit: Payer: Medicaid Other

## 2013-07-09 ENCOUNTER — Ambulatory Visit (INDEPENDENT_AMBULATORY_CARE_PROVIDER_SITE_OTHER): Payer: Medicaid Other | Admitting: *Deleted

## 2013-07-09 DIAGNOSIS — IMO0001 Reserved for inherently not codable concepts without codable children: Secondary | ICD-10-CM

## 2013-07-09 DIAGNOSIS — Z309 Encounter for contraceptive management, unspecified: Secondary | ICD-10-CM

## 2013-07-09 LAB — POCT URINE PREGNANCY: Preg Test, Ur: NEGATIVE

## 2013-07-09 MED ORDER — MEDROXYPROGESTERONE ACETATE 150 MG/ML IM SUSP
150.0000 mg | Freq: Once | INTRAMUSCULAR | Status: AC
  Administered 2013-07-09: 150 mg via INTRAMUSCULAR

## 2013-07-09 NOTE — Progress Notes (Signed)
   Pt late for Depo Provera injection.  Pregnancy test order; results negative.  Pt stated last sex was 1 week ago.  Pt advised to used another reliabe form of birthcontrol for the next 7 day and to take another pregnancy in 1 week.  Pt tolerated Depo injection. Depo given Right upper outer quadrant.  Next injection due July 16 - October 08, 2013.  Reminder card given. Derl Barrow, RN

## 2013-07-30 ENCOUNTER — Ambulatory Visit: Payer: Self-pay | Admitting: Family

## 2013-08-04 ENCOUNTER — Encounter: Payer: Self-pay | Admitting: Emergency Medicine

## 2013-08-04 ENCOUNTER — Ambulatory Visit (INDEPENDENT_AMBULATORY_CARE_PROVIDER_SITE_OTHER): Payer: Medicaid Other | Admitting: Emergency Medicine

## 2013-08-04 VITALS — BP 125/78 | HR 112 | Temp 98.3°F | Ht 62.0 in | Wt 204.0 lb

## 2013-08-04 DIAGNOSIS — H9209 Otalgia, unspecified ear: Secondary | ICD-10-CM

## 2013-08-04 DIAGNOSIS — H9203 Otalgia, bilateral: Secondary | ICD-10-CM | POA: Insufficient documentation

## 2013-08-04 MED ORDER — ALBUTEROL SULFATE HFA 108 (90 BASE) MCG/ACT IN AERS: 2.0000 | INHALATION_SPRAY | Freq: Four times a day (QID) | RESPIRATORY_TRACT | Status: DC

## 2013-08-04 MED ORDER — FLUTICASONE PROPIONATE 50 MCG/ACT NA SUSP: 2.0000 | Freq: Every day | NASAL | Status: DC

## 2013-08-04 MED ORDER — CETIRIZINE HCL 10 MG PO TABS: 10.0000 mg | ORAL_TABLET | Freq: Every day | ORAL | Status: DC

## 2013-08-04 MED ORDER — ANTIPYRINE-BENZOCAINE 5.4-1.4 % OT SOLN: 3.0000 [drp] | OTIC | Status: DC | PRN

## 2013-08-04 NOTE — Patient Instructions (Signed)
It was nice to see you!  I think your problems are coming from your allergies. We changed the medicines you are taking. STOP loratadine and atrovent nasal spray (the 4 times a day one) START Zyrtec 1 pill daily. START flonase 2 sprays each nostril once a day  You can try the Auralgen ear drops, but I'm not sure they will help.  Follow up in 2 weeks.

## 2013-08-04 NOTE — Progress Notes (Signed)
   Subjective:    Patient ID: Leslie Skinner, female    DOB: Aug 02, 1995, 18 y.o.   MRN: [id number]  HPI Leslie Skinner is here for ear pain.  She states for the last 2 weeks she has had ear aches off and on. These ear aches are associated with muffled hearing, headache, sinus pressure, sneezing. Denies any fevers or ear drainage. She was started on Atrovent nasal spray by urgent care which she states does help some. She has a hard time taking it 4 times a day. She states the loratadine does not seem to be working for her allergies anymore.  Current Outpatient Prescriptions on File Prior to Visit  Medication Sig Dispense Refill  . ACCU-CHEK FASTCLIX LANCETS MISC CHECK SUGAR 6 X DAILY  204 each  3  . ACCU-CHEK SMARTVIEW test strip CHECK SUGAR 6 X DAILY  200 each  3  . bethanechol (URECHOLINE) 5 MG tablet Take 1 tablet (5 mg total) by mouth 3 (three) times daily.  100 tablet  11  . capsaicin (ZOSTRIX) 0.025 % cream Apply topically 2 (two) times daily.  60 g  2  . ibuprofen (ADVIL,MOTRIN) 600 MG tablet Take 1 tablet (600 mg total) by mouth every 6 (six) hours as needed.  30 tablet  0  . lansoprazole (PREVACID) 30 MG capsule Take 1 capsule (30 mg total) by mouth daily.  30 capsule  11  . lisinopril (PRINIVIL,ZESTRIL) 10 MG tablet Take 1 tablet (10 mg total) by mouth daily.  30 tablet  6  . medroxyPROGESTERone (DEPO-PROVERA) 150 MG/ML injection Inject 150 mg into the muscle every 3 (three) months.      . metFORMIN (GLUCOPHAGE) 500 MG tablet Take 1 tablet (500 mg total) by mouth 2 (two) times daily with a meal.  60 tablet  7  . naproxen (NAPROSYN) 500 MG tablet Take 1 tablet (500 mg total) by mouth 2 (two) times daily.  30 tablet  0  . topiramate (TOPAMAX) 25 MG tablet TAKE 2 TABLETS EVERY MORNING  60 tablet  0  . traMADol (ULTRAM) 50 MG tablet Take 2 tablets (100 mg total) by mouth every 8 (eight) hours as needed.  30 tablet  0  . [DISCONTINUED] bethanechol (URECHOLINE) 5 MG tablet Take 5 mg  by mouth 3 (three) times daily.        . [DISCONTINUED] diphenhydrAMINE (BENADRYL) 25 MG tablet Take 12.5 mg by mouth every 6 (six) hours as needed. For itching       No current facility-administered medications on file prior to visit.    I have reviewed and updated the following as appropriate: allergies and current medications SHx: non smoker  Review of Systems See HPI    Objective:   Physical Exam BP 125/78  Pulse 112  Temp(Src) 98.3 F (36.8 C) (Oral)  Ht 5\' 2"  (1.575 m)  Wt 204 lb (92.534 kg)  BMI 37.30 kg/m2 Gen: alert, cooperative, NAD HEENT: AT/[place], sclera white, MMM, no pharyngeal erythema or exudate, nasal mucosa mildly erythematous; bilateral TMs with middle ear effusion and distortion of light reflex      Assessment & Plan:

## 2013-08-04 NOTE — Assessment & Plan Note (Signed)
Effusion seen bilaterally. This is likely related to her allergy symptoms which are poorly controlled. Change loratadine to Zyrtec. Change Atrovent nasal spray to Flonase. I provided a prescription for Auralgan to use as needed for ear pain. Followup in 2 weeks for recheck and annual exam.

## 2013-08-15 ENCOUNTER — Other Ambulatory Visit: Payer: Self-pay | Admitting: Pediatrics

## 2013-08-15 DIAGNOSIS — R1310 Dysphagia, unspecified: Secondary | ICD-10-CM

## 2013-08-15 DIAGNOSIS — K219 Gastro-esophageal reflux disease without esophagitis: Secondary | ICD-10-CM

## 2013-08-17 ENCOUNTER — Other Ambulatory Visit: Payer: Self-pay | Admitting: Pediatrics

## 2013-08-17 DIAGNOSIS — K219 Gastro-esophageal reflux disease without esophagitis: Secondary | ICD-10-CM

## 2013-08-17 MED ORDER — OMEPRAZOLE 20 MG PO CPDR: 20.0000 mg | DELAYED_RELEASE_CAPSULE | Freq: Every day | ORAL | Status: DC

## 2013-08-17 NOTE — Telephone Encounter (Signed)
Here's one... kinda old?

## 2013-08-18 ENCOUNTER — Encounter: Payer: Self-pay | Admitting: Family

## 2013-08-18 ENCOUNTER — Ambulatory Visit (INDEPENDENT_AMBULATORY_CARE_PROVIDER_SITE_OTHER): Payer: Medicaid Other | Admitting: Family

## 2013-08-18 VITALS — BP 134/80 | HR 94 | Ht 62.0 in | Wt 207.6 lb

## 2013-08-18 DIAGNOSIS — R209 Unspecified disturbances of skin sensation: Secondary | ICD-10-CM

## 2013-08-18 DIAGNOSIS — G43009 Migraine without aura, not intractable, without status migrainosus: Secondary | ICD-10-CM

## 2013-08-18 DIAGNOSIS — G44229 Chronic tension-type headache, not intractable: Secondary | ICD-10-CM

## 2013-08-18 DIAGNOSIS — R200 Anesthesia of skin: Secondary | ICD-10-CM
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
## 2013-08-18 NOTE — Patient Instructions (Signed)
Continue the Topiramate 25mg  - 2 tablets in the morning for headaches. We have tried to increase this dose in the past and you had side effects, so we will need to leave it at the same dose for now. When you have a headache, you may continue to treat it with Ibuprofen and rest.   Things that you can do to help with headaches - drink at least 40 oz of water per day, try not to sleep during the day, and exercise every day.   For the numbness on the soles of your feet - do not go barefoot to avoid accidentally injuring your feet. Work on keeping your blood sugars in normal range. Follow up with your [facility] doctor.   I will see you back in follow up in 1 year or sooner if needed.

## 2013-08-19 ENCOUNTER — Encounter: Payer: Self-pay | Admitting: Family

## 2013-08-19 DIAGNOSIS — G44229 Chronic tension-type headache, not intractable: Secondary | ICD-10-CM | POA: Insufficient documentation

## 2013-09-07 ENCOUNTER — Other Ambulatory Visit: Payer: Self-pay | Admitting: Pediatric Endocrinology

## 2013-10-29 ENCOUNTER — Ambulatory Visit: Payer: Medicaid Other

## 2013-10-30 ENCOUNTER — Ambulatory Visit: Payer: Medicaid Other

## 2013-11-06 ENCOUNTER — Ambulatory Visit: Payer: Medicaid Other | Admitting: Family Medicine

## 2013-11-18 ENCOUNTER — Ambulatory Visit: Payer: Medicaid Other | Admitting: Family Medicine

## 2013-11-30 ENCOUNTER — Encounter (HOSPITAL_COMMUNITY): Payer: Self-pay | Admitting: Emergency Medicine

## 2013-11-30 ENCOUNTER — Emergency Department (HOSPITAL_COMMUNITY)
Admission: EM | Admit: 2013-11-30 | Discharge: 2013-11-30 | Disposition: A | Discharge status: Home / Self Care | Payer: Medicaid Other | Attending: Emergency Medicine | Admitting: Emergency Medicine

## 2013-11-30 DIAGNOSIS — N949 Unspecified condition associated with female genital organs and menstrual cycle: Secondary | ICD-10-CM | POA: Insufficient documentation

## 2013-11-30 DIAGNOSIS — E669 Obesity, unspecified: Secondary | ICD-10-CM | POA: Diagnosis not present

## 2013-11-30 DIAGNOSIS — N390 Urinary tract infection, site not specified: Secondary | ICD-10-CM

## 2013-11-30 DIAGNOSIS — I10 Essential (primary) hypertension: Secondary | ICD-10-CM | POA: Diagnosis not present

## 2013-11-30 DIAGNOSIS — K219 Gastro-esophageal reflux disease without esophagitis: Secondary | ICD-10-CM | POA: Insufficient documentation

## 2013-11-30 DIAGNOSIS — Z8739 Personal history of other diseases of the musculoskeletal system and connective tissue: Secondary | ICD-10-CM | POA: Diagnosis not present

## 2013-11-30 DIAGNOSIS — G43909 Migraine, unspecified, not intractable, without status migrainosus: Secondary | ICD-10-CM | POA: Insufficient documentation

## 2013-11-30 DIAGNOSIS — Z3202 Encounter for pregnancy test, result negative: Secondary | ICD-10-CM | POA: Diagnosis not present

## 2013-11-30 DIAGNOSIS — Z791 Long term (current) use of non-steroidal anti-inflammatories (NSAID): Secondary | ICD-10-CM | POA: Diagnosis not present

## 2013-11-30 DIAGNOSIS — Z872 Personal history of diseases of the skin and subcutaneous tissue: Secondary | ICD-10-CM | POA: Insufficient documentation

## 2013-11-30 DIAGNOSIS — Z79899 Other long term (current) drug therapy: Secondary | ICD-10-CM | POA: Insufficient documentation

## 2013-11-30 DIAGNOSIS — E119 Type 2 diabetes mellitus without complications: Secondary | ICD-10-CM | POA: Diagnosis not present

## 2013-11-30 DIAGNOSIS — IMO0002 Reserved for concepts with insufficient information to code with codable children: Secondary | ICD-10-CM | POA: Insufficient documentation

## 2013-11-30 LAB — URINALYSIS, ROUTINE W REFLEX MICROSCOPIC
Bilirubin Urine: NEGATIVE
Glucose, UA: NEGATIVE mg/dL
Hgb urine dipstick: NEGATIVE
Ketones, ur: NEGATIVE mg/dL
Nitrite: POSITIVE — AB
Protein, ur: NEGATIVE mg/dL
SPECIFIC GRAVITY, URINE: 1.027 (ref 1.005–1.030)
UROBILINOGEN UA: 0.2 mg/dL (ref 0.0–1.0)
pH: 6 (ref 5.0–8.0)

## 2013-11-30 LAB — URINE MICROSCOPIC-ADD ON

## 2013-11-30 LAB — PREGNANCY, URINE: PREG TEST UR: NEGATIVE

## 2013-11-30 MED ORDER — PHENAZOPYRIDINE HCL 200 MG PO TABS: 200.0000 mg | ORAL_TABLET | Freq: Three times a day (TID) | ORAL | Status: DC

## 2013-11-30 MED ORDER — CEPHALEXIN 250 MG PO CAPS
500.0000 mg | ORAL_CAPSULE | Freq: Once | ORAL | Status: AC
  Administered 2013-11-30: 500 mg via ORAL
  Filled 2013-11-30: qty 2

## 2013-11-30 MED ORDER — CEPHALEXIN 500 MG PO CAPS: 500.0000 mg | ORAL_CAPSULE | Freq: Four times a day (QID) | ORAL | Status: DC

## 2013-11-30 MED ORDER — HYDROCODONE-ACETAMINOPHEN 5-325 MG PO TABS: 2.0000 | ORAL_TABLET | ORAL | Status: DC | PRN

## 2013-11-30 MED ORDER — HYDROCODONE-ACETAMINOPHEN 5-325 MG PO TABS
2.0000 | ORAL_TABLET | Freq: Once | ORAL | Status: AC
  Administered 2013-11-30: 2 via ORAL
  Filled 2013-11-30: qty 2
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
## 2013-11-30 NOTE — ED Notes (Signed)
Pt presents with pelvic pain for the past 3 days, admits to pain with urination- admits to foul odor to urine.  Denies vaginal discharge or bleeding.  Denies N/V.

## 2013-11-30 NOTE — Discharge Instructions (Signed)
Urinary Tract Infection °Urinary tract infections (UTIs) can develop anywhere along your urinary tract. Your urinary tract is your body's drainage system for removing wastes and extra water. Your urinary tract includes two kidneys, two ureters, a bladder, and a urethra. Your kidneys are a pair of bean-shaped organs. Each kidney is about the size of your fist. They are located below your ribs, one on each side of your spine. °CAUSES °Infections are caused by microbes, which are microscopic organisms, including fungi, viruses, and bacteria. These organisms are so small that they can only be seen through a microscope. Bacteria are the microbes that most commonly cause UTIs. °SYMPTOMS  °Symptoms of UTIs may vary by age and gender of the patient and by the location of the infection. Symptoms in young women typically include a frequent and intense urge to urinate and a painful, burning feeling in the bladder or urethra during urination. Older women and men are more likely to be tired, shaky, and weak and have muscle aches and abdominal pain. A fever may mean the infection is in your kidneys. Other symptoms of a kidney infection include pain in your back or sides below the ribs, nausea, and vomiting. °DIAGNOSIS °To diagnose a UTI, your caregiver will ask you about your symptoms. Your caregiver also will ask to provide a urine sample. The urine sample will be tested for bacteria and white blood cells. White blood cells are made by your body to help fight infection. °TREATMENT  °Typically, UTIs can be treated with medication. Because most UTIs are caused by a bacterial infection, they usually can be treated with the use of antibiotics. The choice of antibiotic and length of treatment depend on your symptoms and the type of bacteria causing your infection. °HOME CARE INSTRUCTIONS °· If you were prescribed antibiotics, take them exactly as your caregiver instructs you. Finish the medication even if you feel better after you  have only taken some of the medication. °· Drink enough water and fluids to keep your urine clear or pale yellow. °· Avoid caffeine, tea, and carbonated beverages. They tend to irritate your bladder. °· Empty your bladder often. Avoid holding urine for long periods of time. °· Empty your bladder before and after sexual intercourse. °· After a bowel movement, women should cleanse from front to back. Use each tissue only once. °SEEK MEDICAL CARE IF:  °· You have back pain. °· You develop a fever. °· Your symptoms do not begin to resolve within 3 days. °SEEK IMMEDIATE MEDICAL CARE IF:  °· You have severe back pain or lower abdominal pain. °· You develop chills. °· You have nausea or vomiting. °· You have continued burning or discomfort with urination. °MAKE SURE YOU:  °· Understand these instructions. °· Will watch your condition. °· Will get help right away if you are not doing well or get worse. °Document Released: 12/06/2004 Document Revised: 08/28/2011 Document Reviewed: 04/06/2011 °ExitCare® Patient Information ©2015 [employer]. This information is not intended to replace advice given to you by your health care provider. Make sure you discuss any questions you have with your health care provider. °Urinary Tract Infection °Urinary tract infections (UTIs) can develop anywhere along your urinary tract. Your urinary tract is your body's drainage system for removing wastes and extra water. Your urinary tract includes two kidneys, two ureters, a bladder, and a urethra. Your kidneys are a pair of bean-shaped organs. Each kidney is about the size of your fist. They are located below your ribs, one on each   side of your spine. °CAUSES °Infections are caused by microbes, which are microscopic organisms, including fungi, viruses, and bacteria. These organisms are so small that they can only be seen through a microscope. Bacteria are the microbes that most commonly cause UTIs. °SYMPTOMS  °Symptoms of UTIs may vary by age  and gender of the patient and by the location of the infection. Symptoms in young women typically include a frequent and intense urge to urinate and a painful, burning feeling in the bladder or urethra during urination. Older women and men are more likely to be tired, shaky, and weak and have muscle aches and abdominal pain. A fever may mean the infection is in your kidneys. Other symptoms of a kidney infection include pain in your back or sides below the ribs, nausea, and vomiting. °DIAGNOSIS °To diagnose a UTI, your caregiver will ask you about your symptoms. Your caregiver also will ask to provide a urine sample. The urine sample will be tested for bacteria and white blood cells. White blood cells are made by your body to help fight infection. °TREATMENT  °Typically, UTIs can be treated with medication. Because most UTIs are caused by a bacterial infection, they usually can be treated with the use of antibiotics. The choice of antibiotic and length of treatment depend on your symptoms and the type of bacteria causing your infection. °HOME CARE INSTRUCTIONS °· If you were prescribed antibiotics, take them exactly as your caregiver instructs you. Finish the medication even if you feel better after you have only taken some of the medication. °· Drink enough water and fluids to keep your urine clear or pale yellow. °· Avoid caffeine, tea, and carbonated beverages. They tend to irritate your bladder. °· Empty your bladder often. Avoid holding urine for long periods of time. °· Empty your bladder before and after sexual intercourse. °· After a bowel movement, women should cleanse from front to back. Use each tissue only once. °SEEK MEDICAL CARE IF:  °· You have back pain. °· You develop a fever. °· Your symptoms do not begin to resolve within 3 days. °SEEK IMMEDIATE MEDICAL CARE IF:  °· You have severe back pain or lower abdominal pain. °· You develop chills. °· You have nausea or vomiting. °· You have continued  burning or discomfort with urination. °MAKE SURE YOU:  °· Understand these instructions. °· Will watch your condition. °· Will get help right away if you are not doing well or get worse. °Document Released: 12/06/2004 Document Revised: 08/28/2011 Document Reviewed: 04/06/2011 °ExitCare® Patient Information ©2015 [employer]. This information is not intended to replace advice given to you by your health care provider. Make sure you discuss any questions you have with your health care provider. ° °

## 2013-11-30 NOTE — ED Notes (Signed)
Paged pt at triage, no reply at this time.

## 2013-12-01 ENCOUNTER — Ambulatory Visit: Payer: Medicaid Other

## 2013-12-08 ENCOUNTER — Ambulatory Visit: Payer: Medicaid Other | Admitting: Family Medicine

## 2013-12-18 ENCOUNTER — Ambulatory Visit: Payer: Medicaid Other | Admitting: Family Medicine

## 2014-02-01 ENCOUNTER — Other Ambulatory Visit: Payer: Self-pay | Admitting: Pediatric Endocrinology

## 2014-02-02 ENCOUNTER — Other Ambulatory Visit: Payer: Self-pay | Admitting: Pediatric Endocrinology

## 2014-02-10 ENCOUNTER — Ambulatory Visit (INDEPENDENT_AMBULATORY_CARE_PROVIDER_SITE_OTHER): Payer: Medicaid Other | Admitting: Family Medicine

## 2014-02-10 ENCOUNTER — Encounter: Payer: Self-pay | Admitting: Family Medicine

## 2014-02-10 VITALS — BP 118/81 | HR 78 | Temp 98.2°F | Ht 62.0 in | Wt 195.2 lb

## 2014-02-10 DIAGNOSIS — I10 Essential (primary) hypertension: Secondary | ICD-10-CM

## 2014-02-10 DIAGNOSIS — E1142 Type 2 diabetes mellitus with diabetic polyneuropathy: Secondary | ICD-10-CM

## 2014-02-10 DIAGNOSIS — L700 Acne vulgaris: Secondary | ICD-10-CM

## 2014-02-10 DIAGNOSIS — R002 Palpitations: Secondary | ICD-10-CM

## 2014-02-10 MED ORDER — ACCU-CHEK FASTCLIX LANCETS MISC: Status: DC

## 2014-02-10 MED ORDER — FLUTICASONE PROPIONATE 50 MCG/ACT NA SUSP: 2.0000 | Freq: Every day | NASAL | Status: DC

## 2014-02-10 MED ORDER — GLUCOSE BLOOD VI STRP: ORAL_STRIP | Status: DC

## 2014-02-10 MED ORDER — METFORMIN HCL 500 MG PO TABS: 500.0000 mg | ORAL_TABLET | Freq: Two times a day (BID) | ORAL | Status: DC

## 2014-02-10 MED ORDER — CLINDAMYCIN PHOS-BENZOYL PEROX 1-5 % EX GEL: Freq: Two times a day (BID) | CUTANEOUS | Status: DC

## 2014-02-10 MED ORDER — ACCU-CHEK NANO SMARTVIEW W/DEVICE KIT: 1.0000 | PACK | Status: DC | PRN

## 2014-02-10 NOTE — Assessment & Plan Note (Signed)
Reports palpitations described as racing heart with sweating , dizziness, and lightheadedness.  refer to cardiology for workup  Checking labs , TSH

## 2014-02-10 NOTE — Assessment & Plan Note (Addendum)
Not hypertensive today Refill lisinopril  After she has a year negative urine pregnancy test and depo is placed  Check labs

## 2014-02-10 NOTE — Patient Instructions (Signed)
Great to meet you today!  Please  Make a lab and nursing visit appointment up front before you leave , try to get a morning appointment so that you can have fasting labs   You should hear from the cardiologist office  For an appointment to evaluate your palpitations.  Diet Recommendations for Diabetes   Starchy (carb) foods include: Bread, rice, pasta, potatoes, corn, crackers, bagels, muffins, all baked goods.   Protein foods include: Meat, fish, poultry, eggs, dairy foods, and beans such as pinto and kidney beans (beans also provide carbohydrate).   1. Eat at least 3 meals and 1-2 snacks per day. Never go more than 4-5 hours while awake without eating.  2. Limit starchy foods to TWO per meal and ONE per snack. ONE portion of a starchy  food is equal to the following:   - ONE slice of bread (or its equivalent, such as half of a hamburger bun).   - 1/2 cup of a "scoopable" starchy food such as potatoes or rice.   - 1 OUNCE (28 grams) of starchy snack foods such as crackers or pretzels (look on label).   - 15 grams of carbohydrate as shown on food label.  3. Both lunch and dinner should include a protein food, a carb food, and vegetables.   - Obtain twice as many veg's as protein or carbohydrate foods for both lunch and dinner.   - Try to keep frozen veg's on hand for a quick vegetable serving.     - Fresh or frozen veg's are best.  4. Breakfast should always include protein.     Palpitations A palpitation is the feeling that your heartbeat is irregular or is faster than normal. It may feel like your heart is fluttering or skipping a beat. Palpitations are usually not a serious problem. However, in some cases, you may need further medical evaluation. CAUSES  Palpitations can be caused by:  Smoking.  Caffeine or other stimulants, such as diet pills or energy drinks.  Alcohol.  Stress and anxiety.  Strenuous physical activity.  Fatigue.  Certain medicines.  Heart disease,  especially if you have a history of irregular heart rhythms (arrhythmias), such as atrial fibrillation, atrial flutter, or supraventricular tachycardia.  An improperly working pacemaker or defibrillator. DIAGNOSIS  To find the cause of your palpitations, your health care provider will take your medical history and perform a physical exam. Your health care provider may also have you take a test called an ambulatory electrocardiogram (ECG). An ECG records your heartbeat patterns over a 24-hour period. You may also have other tests, such as:  Transthoracic echocardiogram (TTE). During echocardiography, sound waves are used to evaluate how blood flows through your heart.  Transesophageal echocardiogram (TEE).  Cardiac monitoring. This allows your health care provider to monitor your heart rate and rhythm in real time.  Holter monitor. This is a portable device that records your heartbeat and can help diagnose heart arrhythmias. It allows your health care provider to track your heart activity for several days, if needed.  Stress tests by exercise or by giving medicine that makes the heart beat faster. TREATMENT  Treatment of palpitations depends on the cause of your symptoms and can vary greatly. Most cases of palpitations do not require any treatment other than time, relaxation, and monitoring your symptoms. Other causes, such as atrial fibrillation, atrial flutter, or supraventricular tachycardia, usually require further treatment. HOME CARE INSTRUCTIONS   Avoid:  Caffeinated coffee, tea, soft drinks, diet pills, and  energy drinks.  Chocolate.  Alcohol.  Stop smoking if you smoke.  Reduce your stress and anxiety. Things that can help you relax include:  A method of controlling things in your body, such as your heartbeats, with your mind (biofeedback).  Yoga.  Meditation.  Physical activity such as swimming, jogging, or walking.  Get plenty of rest and sleep. SEEK MEDICAL CARE IF:    You continue to have a fast or irregular heartbeat beyond 24 hours.  Your palpitations occur more often. SEEK IMMEDIATE MEDICAL CARE IF:  You have chest pain or shortness of breath.  You have a severe headache.  You feel dizzy or you faint. MAKE SURE YOU:  Understand these instructions.  Will watch your condition.  Will get help right away if you are not doing well or get worse. Document Released: 02/24/2000 Document Revised: 03/03/2013 Document Reviewed: 04/27/2011 [facility] Patient Information 2015 [place], [place]. This information is not intended to replace advice given to you by your health care provider. Make sure you discuss any questions you have with your health care provider.

## 2014-02-10 NOTE — Assessment & Plan Note (Signed)
It appears that she's gotten diabetic care at another clinic some not sure of how severe her diabetes is Continue metformin and lisinopril  Check labs

## 2014-02-10 NOTE — Assessment & Plan Note (Signed)
Given Rx for BenzaClin Papular  acne

## 2014-02-10 NOTE — Progress Notes (Signed)
Patient ID: Leslie Skinner, female   DOB: [birth year], 18 y.o.   MRN: [id number]   HPI  Patient presents today for  Diabetes, hypertension, palpitations  Palpitations States that she  Has palpitations perceived as a racing heart associated with sweating, dizziness , and lightheadedness.  They're not occurring  currently but do happen several times a week  Hypertension  denies chest pain, dyspnea, leg edema Palpitations per above  Did not take lisinopril today, but states that she does take it regularly  not watching her diet  Diabetes previously getting care Endocrinologist , however states that she has not been there for " a very long time". States that she's out of strips doesn't have a meter anymore  would like a refill on her metformin  does not watch her diet  She cannot tell me her last A1c states that she's improved a lot since she was originally diagnosed due to weight loss.  reports numbness of her bilateral plantar surfaces of her feet that she believes is due to diabetes. Smoking status noted ROS: Per HPI   I recommended very close follow-up to her,  It's unclear where she's been getting care for diabetes and how severe diseases.  Objective: BP 118/81 mmHg  Pulse 78  Temp(Src) 98.2 F (36.8 C) (Oral)  Ht '5\' 2"'  (1.575 m)  Wt 195 lb 3.2 oz (88.542 kg)  BMI 35.69 kg/m2 Gen: NAD, alert, cooperative with exam , obese HEENT: NCAT CV: RRR, good S1/S2, no murmur Resp: CTABL, no wheezes, non-labored Ext: No edema, warm Neuro: Alert and oriented, No gross deficits  Assessment and plan:  Palpitations  Reports palpitations described as racing heart with sweating , dizziness, and lightheadedness.  refer to cardiology for workup  Checking labs , TSH  Hypertension  Not hypertensive today Refill lisinopril  After she has a year negative urine pregnancy test and depo is placed  Check labs  T2DM (type 2 diabetes mellitus)  It appears that she's gotten diabetic care  at another clinic some not sure of how severe her diabetes is Continue metformin and lisinopril  Check labs  Acne Given Rx for BenzaClin Papular  acne   Orders Placed This Encounter  Procedures  . Comprehensive metabolic panel    Standing Status: Future     Number of Occurrences:      Standing Expiration Date: 02/11/2015  . CBC    Standing Status: Future     Number of Occurrences:      Standing Expiration Date: 02/11/2015  . Lipid panel    Standing Status: Future     Number of Occurrences:      Standing Expiration Date: 02/11/2015  . TSH    Standing Status: Future     Number of Occurrences:      Standing Expiration Date: 02/11/2015  . Ambulatory referral to Cardiology    Referral Priority:  Routine    Referral Type:  Consultation    Referral Reason:  Specialty Services Required    Requested Specialty:  Cardiology    Number of Visits Requested:  1  . POCT glycosylated hemoglobin (Hb A1C)    Standing Status: Future     Number of Occurrences:      Standing Expiration Date: 02/11/2015    Meds ordered this encounter  Medications  . ACCU-CHEK FASTCLIX LANCETS MISC    Sig: Check blood sugar once daily    Dispense:  204 each    Refill:  3  . glucose blood (ACCU-CHEK SMARTVIEW) test  strip    Sig: Check sugar once daily    Dispense:  200 each    Refill:  3  . fluticasone (FLONASE) 50 MCG/ACT nasal spray    Sig: Place 2 sprays into both nostrils daily.    Dispense:  16 g    Refill:  6  . metFORMIN (GLUCOPHAGE) 500 MG tablet    Sig: Take 1 tablet (500 mg total) by mouth 2 (two) times daily with a meal.    Dispense:  60 tablet    Refill:  7  . Blood Glucose Monitoring Suppl (ACCU-CHEK NANO SMARTVIEW) W/DEVICE KIT    Sig: 1 each by Does not apply route as needed.    Dispense:  1 kit    Refill:  0  . clindamycin-benzoyl peroxide (BENZACLIN) gel    Sig: Apply topically 2 (two) times daily. Apply pea sized amount to face daily    Dispense:  25 g    Refill:  1

## 2014-02-11 ENCOUNTER — Encounter (HOSPITAL_COMMUNITY): Payer: Self-pay | Admitting: Emergency Medicine

## 2014-02-11 ENCOUNTER — Telehealth: Payer: Self-pay | Admitting: *Deleted

## 2014-02-11 ENCOUNTER — Emergency Department (HOSPITAL_COMMUNITY)
Admission: EM | Admit: 2014-02-11 | Discharge: 2014-02-11 | Disposition: A | Discharge status: Home / Self Care | Payer: Medicaid Other | Attending: Emergency Medicine | Admitting: Emergency Medicine

## 2014-02-11 DIAGNOSIS — Z862 Personal history of diseases of the blood and blood-forming organs and certain disorders involving the immune mechanism: Secondary | ICD-10-CM | POA: Diagnosis not present

## 2014-02-11 DIAGNOSIS — M419 Scoliosis, unspecified: Secondary | ICD-10-CM | POA: Diagnosis not present

## 2014-02-11 DIAGNOSIS — I10 Essential (primary) hypertension: Secondary | ICD-10-CM | POA: Insufficient documentation

## 2014-02-11 DIAGNOSIS — Z79899 Other long term (current) drug therapy: Secondary | ICD-10-CM | POA: Insufficient documentation

## 2014-02-11 DIAGNOSIS — Z7951 Long term (current) use of inhaled steroids: Secondary | ICD-10-CM | POA: Insufficient documentation

## 2014-02-11 DIAGNOSIS — Z720 Tobacco use: Secondary | ICD-10-CM | POA: Diagnosis not present

## 2014-02-11 DIAGNOSIS — Z872 Personal history of diseases of the skin and subcutaneous tissue: Secondary | ICD-10-CM | POA: Diagnosis not present

## 2014-02-11 DIAGNOSIS — E669 Obesity, unspecified: Secondary | ICD-10-CM | POA: Insufficient documentation

## 2014-02-11 DIAGNOSIS — Z87828 Personal history of other (healed) physical injury and trauma: Secondary | ICD-10-CM | POA: Diagnosis not present

## 2014-02-11 DIAGNOSIS — K219 Gastro-esophageal reflux disease without esophagitis: Secondary | ICD-10-CM | POA: Diagnosis not present

## 2014-02-11 DIAGNOSIS — E119 Type 2 diabetes mellitus without complications: Secondary | ICD-10-CM | POA: Insufficient documentation

## 2014-02-11 DIAGNOSIS — J029 Acute pharyngitis, unspecified: Secondary | ICD-10-CM | POA: Diagnosis present

## 2014-02-11 DIAGNOSIS — M542 Cervicalgia: Secondary | ICD-10-CM | POA: Diagnosis not present

## 2014-02-11 DIAGNOSIS — E010 Iodine-deficiency related diffuse (endemic) goiter: Secondary | ICD-10-CM | POA: Insufficient documentation

## 2014-02-11 DIAGNOSIS — R200 Anesthesia of skin: Secondary | ICD-10-CM | POA: Insufficient documentation

## 2014-02-11 DIAGNOSIS — Z7952 Long term (current) use of systemic steroids: Secondary | ICD-10-CM | POA: Insufficient documentation

## 2014-02-11 LAB — CBC WITH DIFFERENTIAL/PLATELET
BASOS ABS: 0 10*3/uL (ref 0.0–0.1)
Basophils Relative: 0 % (ref 0–1)
EOS ABS: 0.1 10*3/uL (ref 0.0–0.7)
Eosinophils Relative: 1 % (ref 0–5)
HEMATOCRIT: 40.4 % (ref 36.0–46.0)
Hemoglobin: 13.6 g/dL (ref 12.0–15.0)
Lymphocytes Relative: 43 % (ref 12–46)
Lymphs Abs: 4.1 10*3/uL — ABNORMAL HIGH (ref 0.7–4.0)
MCH: 27 pg (ref 26.0–34.0)
MCHC: 33.7 g/dL (ref 30.0–36.0)
MCV: 80.3 fL (ref 78.0–100.0)
MONOS PCT: 6 % (ref 3–12)
Monocytes Absolute: 0.6 10*3/uL (ref 0.1–1.0)
Neutro Abs: 4.8 10*3/uL (ref 1.7–7.7)
Neutrophils Relative %: 50 % (ref 43–77)
Platelets: 246 10*3/uL (ref 150–400)
RBC: 5.03 MIL/uL (ref 3.87–5.11)
RDW: 14.2 % (ref 11.5–15.5)
WBC: 9.5 10*3/uL (ref 4.0–10.5)

## 2014-02-11 LAB — I-STAT CHEM 8, ED
BUN: 15 mg/dL (ref 6–23)
CALCIUM ION: 1.19 mmol/L (ref 1.12–1.23)
Chloride: 104 mEq/L (ref 96–112)
Creatinine, Ser: 0.9 mg/dL (ref 0.50–1.10)
GLUCOSE: 87 mg/dL (ref 70–99)
HCT: 44 % (ref 36.0–46.0)
HEMOGLOBIN: 15 g/dL (ref 12.0–15.0)
Potassium: 3.3 mEq/L — ABNORMAL LOW (ref 3.7–5.3)
Sodium: 141 mEq/L (ref 137–147)
TCO2: 21 mmol/L (ref 0–100)

## 2014-02-11 LAB — TSH: TSH: 2.68 u[IU]/mL (ref 0.350–4.500)

## 2014-02-11 LAB — T4, FREE: Free T4: 1.31 ng/dL (ref 0.80–1.80)

## 2014-02-11 MED ORDER — NAPROXEN 500 MG PO TABS: 500.0000 mg | ORAL_TABLET | Freq: Two times a day (BID) | ORAL | Status: DC

## 2014-02-11 NOTE — Telephone Encounter (Signed)
error 
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
## 2014-02-11 NOTE — Discharge Instructions (Signed)
Blood work today looks normal. Avoid hard foods. Salt water gargles. Take naproxen as prescribed. If pain is not improving follow-up with primary care doctor for further evaluation. Return if any shortness of breath, fever, worsening symptoms

## 2014-02-11 NOTE — ED Notes (Signed)
Pt c/o sore throat x2 days 

## 2014-02-28 ENCOUNTER — Other Ambulatory Visit: Payer: Self-pay | Admitting: Family

## 2014-03-10 ENCOUNTER — Telehealth: Payer: Self-pay | Admitting: *Deleted

## 2014-03-10 ENCOUNTER — Ambulatory Visit: Payer: Medicaid Other | Admitting: Cardiovascular Disease

## 2014-03-10 NOTE — Telephone Encounter (Signed)
LVM for pt to return call to see about setting up a nurse visit for flu shot. Leslie Skinner, Leslie Skinner

## 2014-03-17 ENCOUNTER — Encounter: Payer: Self-pay | Admitting: Cardiovascular Disease

## 2014-03-17 ENCOUNTER — Ambulatory Visit: Payer: Medicaid Other

## 2014-03-18 ENCOUNTER — Telehealth: Payer: Self-pay | Admitting: Family Medicine

## 2014-03-18 NOTE — Telephone Encounter (Signed)
Pt called and is having issues with her allergies and itchy eyes. She was hoping that the doctor could call in some medications for her. jw

## 2014-03-18 NOTE — Telephone Encounter (Signed)
Pt informed that she would need to come in for an appt to get evaluated for some eye drops. She stated that her eyes are burning now. Clarise Cruz got on the phone and talked to her and said she can try OTC eyes drop or urgent care because we didn't have any openings today. Deseree Kennon Holter, CMA

## 2014-04-28 ENCOUNTER — Telehealth: Payer: Self-pay | Admitting: Family Medicine

## 2014-04-28 DIAGNOSIS — E1142 Type 2 diabetes mellitus with diabetic polyneuropathy: Secondary | ICD-10-CM

## 2014-04-28 NOTE — Telephone Encounter (Signed)
Mother called and needs her daughter to be seen by a podiatrist at [facility] for diabetic treatment / please call and let her know when the appt is. [phone] / SR

## 2014-04-29 NOTE — Telephone Encounter (Signed)
Called, they would like referral for foot numbness they feel is related to DM2. She could benefit from diabetic shoes.   I dont see much evidence of DM2 in the chart.  I asked that she be seen ASAP.   Laroy Apple, MD [place] Resident, PGY-3 04/29/2014, 12:42 PM

## 2014-04-29 NOTE — Telephone Encounter (Signed)
Referral placed in [facility] workqueue, they will contact patient with appointment.

## 2014-05-10 ENCOUNTER — Ambulatory Visit (INDEPENDENT_AMBULATORY_CARE_PROVIDER_SITE_OTHER): Payer: Medicaid Other | Admitting: Family Medicine

## 2014-05-10 ENCOUNTER — Other Ambulatory Visit (HOSPITAL_COMMUNITY)
Admission: RE | Admit: 2014-05-10 | Discharge: 2014-05-10 | Disposition: A | Discharge status: Home / Self Care | Payer: Medicaid Other | Source: Ambulatory Visit | Attending: Family Medicine | Admitting: Family Medicine

## 2014-05-10 ENCOUNTER — Ambulatory Visit (INDEPENDENT_AMBULATORY_CARE_PROVIDER_SITE_OTHER): Payer: Medicaid Other | Admitting: *Deleted

## 2014-05-10 ENCOUNTER — Encounter: Payer: Self-pay | Admitting: Family Medicine

## 2014-05-10 VITALS — BP 142/96 | HR 88 | Temp 98.7°F | Resp 20 | Wt 205.0 lb

## 2014-05-10 DIAGNOSIS — R7309 Other abnormal glucose: Secondary | ICD-10-CM

## 2014-05-10 DIAGNOSIS — I10 Essential (primary) hypertension: Secondary | ICD-10-CM

## 2014-05-10 DIAGNOSIS — N898 Other specified noninflammatory disorders of vagina: Secondary | ICD-10-CM | POA: Insufficient documentation

## 2014-05-10 DIAGNOSIS — Z111 Encounter for screening for respiratory tuberculosis: Secondary | ICD-10-CM

## 2014-05-10 DIAGNOSIS — Z30013 Encounter for initial prescription of injectable contraceptive: Secondary | ICD-10-CM

## 2014-05-10 DIAGNOSIS — R7303 Prediabetes: Secondary | ICD-10-CM

## 2014-05-10 DIAGNOSIS — E1142 Type 2 diabetes mellitus with diabetic polyneuropathy: Secondary | ICD-10-CM

## 2014-05-10 DIAGNOSIS — Z113 Encounter for screening for infections with a predominantly sexual mode of transmission: Secondary | ICD-10-CM | POA: Diagnosis present

## 2014-05-10 LAB — POCT URINE PREGNANCY: PREG TEST UR: NEGATIVE

## 2014-05-10 LAB — POCT WET PREP (WET MOUNT): Clue Cells Wet Prep Whiff POC: POSITIVE

## 2014-05-10 LAB — POCT GLYCOSYLATED HEMOGLOBIN (HGB A1C): Hemoglobin A1C: 5.2

## 2014-05-10 MED ORDER — HYDROCHLOROTHIAZIDE 12.5 MG PO CAPS: 12.5000 mg | ORAL_CAPSULE | Freq: Every day | ORAL | Status: DC

## 2014-05-10 MED ORDER — MEDROXYPROGESTERONE ACETATE 150 MG/ML IM SUSP
150.0000 mg | Freq: Once | INTRAMUSCULAR | Status: AC
  Administered 2014-05-10: 150 mg via INTRAMUSCULAR

## 2014-05-10 NOTE — Assessment & Plan Note (Addendum)
Unclear diabetes history Has history of "prediabetes" that started on metformin and lisinopril Has not followed up well and has not been on metformin for 4-6 months A1C today 5.1, will dc metformin and consider this prediabetes Follow up 3 months

## 2014-05-10 NOTE — Assessment & Plan Note (Signed)
Elevated today, noncompliant with medications Inconsistent with birth control usage, will discontinue lisinopril Start HCTZ at 12.5 mg Follow-up one month

## 2014-05-10 NOTE — Assessment & Plan Note (Signed)
Depot today after negative upreg.

## 2014-05-10 NOTE — Assessment & Plan Note (Signed)
Mild intermittent back pain controlled with naproxen

## 2014-05-10 NOTE — Assessment & Plan Note (Signed)
No headaches recently, very poor compliance now sexually active Discontinue Topamax, has not taken for 4- 6 months

## 2014-05-10 NOTE — Assessment & Plan Note (Addendum)
Recent sexual Debut Lots of questions, now concern for STI Check GC C

## 2014-05-10 NOTE — Progress Notes (Signed)
Patient ID: Leslie Skinner, female   DOB: 10/15/1995, 18 y.o.   MRN: [id number]   HPI  Patient presents today for concern for STI and paperwork  STI Describes that she recently had her first sexual encounter, she is concerned about STI and would like to be checked out. She's having a normal amount of vaginal discharge that is not foul-smelling. She does endorse vaginal itching and irritation as well as burning.  Hypertension, diabetes Not watching her diet Has not been taking medications for 4-6 months at least  Migraine headaches Denies any headaches recently, not taking her Topamax again for at least 4-6 months.  Not using condoms, has not had a Depo shot for quite a while, she can't remember when Also cannot remember last period  Smoking status noted - intermittent smoker ROS: Per HPI  Objective: BP 142/96 mmHg  Pulse 88  Temp(Src) 98.7 F (37.1 C) (Oral)  Resp 20  Wt 205 lb (92.987 kg)  SpO2 97% Gen: NAD, alert, cooperative with exam HEENT: NCAT CV: RRR, good S1/S2, no murmur Resp: CTABL, no wheezes, non-labored Ext: No edema, warm Neuro: Alert and oriented, No gross deficits  GU: Normal-appearing perineum, vaginal walls well rugated and pink, cervix with appropriate/normal amount of thick white discharge, no CMT or adnexal fullness on bimanual exam  Assessment and plan:  Hypertension Elevated today, noncompliant with medications Inconsistent with birth control usage, will discontinue lisinopril Start HCTZ at 12.5 mg Follow-up one month   Migraine without aura No headaches recently, very poor compliance now sexually active Discontinue Topamax, has not taken for 4- 6 months   Scoliosis Mild intermittent back pain controlled with naproxen   Pre-diabetes Unclear diabetes history Has history of "prediabetes" that started on metformin and lisinopril Has not followed up well and has not been on metformin for 4-6 months A1C today 5.1, will dc metformin and  consider this prediabetes Follow up 3 months     Vaginal discharge Recent sexual Debut Lots of questions, now concern for STI Check GC C    Contraception Depot today after negative upreg.      Orders Placed This Encounter  Procedures  . POCT glycosylated hemoglobin (Hb A1C)  . POCT Wet Prep [employer])  . POCT urine pregnancy    Meds ordered this encounter  Medications  . hydrochlorothiazide (MICROZIDE) 12.5 MG capsule    Sig: Take 1 capsule (12.5 mg total) by mouth daily.    Dispense:  30 capsule    Refill:  3  . medroxyPROGESTERone (DEPO-PROVERA) injection 150 mg    Sig:

## 2014-05-10 NOTE — Progress Notes (Signed)
   PPD placed Left Forearm.  Pt to return 05/12/2014 for reading.  Pt tolerated intradermal injection. Derl Barrow, RN

## 2014-05-10 NOTE — Patient Instructions (Signed)
Great to see you!  I will work on your paperwork over t he next few days  We will call you about your results  PLease come back in 3-4 weeks.   Start hydrochlorothiazide every morning for your blood pressure.

## 2014-05-11 ENCOUNTER — Telehealth: Payer: Self-pay | Admitting: Family Medicine

## 2014-05-11 LAB — CERVICOVAGINAL ANCILLARY ONLY
CHLAMYDIA, DNA PROBE: NEGATIVE
Neisseria Gonorrhea: NEGATIVE

## 2014-05-11 MED ORDER — METRONIDAZOLE 500 MG PO TABS: 2000.0000 mg | ORAL_TABLET | Freq: Once | ORAL | Status: DC

## 2014-05-11 NOTE — Telephone Encounter (Signed)
Called to treat BV and discuss A1C, no answer, will try again.   She did not want results mailed.   Laroy Apple, MD [place] Resident, PGY-3 05/11/2014, 11:58 AM

## 2014-05-11 NOTE — Telephone Encounter (Signed)
Treating for BV with 2 G flagyl.  She understands not an STI.   Laroy Apple, MD [place] Resident, PGY-3 05/11/2014, 2:02 PM

## 2014-05-12 ENCOUNTER — Telehealth: Payer: Self-pay | Admitting: Family Medicine

## 2014-05-12 ENCOUNTER — Encounter: Payer: Self-pay | Admitting: *Deleted

## 2014-05-12 ENCOUNTER — Ambulatory Visit (INDEPENDENT_AMBULATORY_CARE_PROVIDER_SITE_OTHER): Payer: Medicaid Other | Admitting: *Deleted

## 2014-05-12 DIAGNOSIS — Z111 Encounter for screening for respiratory tuberculosis: Secondary | ICD-10-CM

## 2014-05-12 LAB — TB SKIN TEST
INDURATION: 0 mm
TB Skin Test: NEGATIVE

## 2014-05-12 NOTE — Telephone Encounter (Signed)
DMV form filled out and placed to be faxed. Will ask team to notify that she can pick it up tomorrow after it has been faxed.   Laroy Apple, MD [place] Resident, PGY-3 05/12/2014, 9:14 AM

## 2014-05-12 NOTE — Progress Notes (Signed)
PPD Reading Note PPD read and results entered in [place]. Result: 0 mm induration. Interpretation: negative Allergic reaction: no  Burna Forts, BSN, RN-BC

## 2014-05-13 NOTE — Telephone Encounter (Signed)
Pt called to check status, talked to [facility], it is ready and has been faxed, informed pt it is ready for p/u.

## 2014-09-20 ENCOUNTER — Ambulatory Visit: Payer: Medicaid Other | Admitting: Family

## 2014-09-30 ENCOUNTER — Ambulatory Visit (INDEPENDENT_AMBULATORY_CARE_PROVIDER_SITE_OTHER): Payer: Medicaid Other | Admitting: Family Medicine

## 2014-09-30 ENCOUNTER — Other Ambulatory Visit (HOSPITAL_COMMUNITY)
Admission: RE | Admit: 2014-09-30 | Discharge: 2014-09-30 | Disposition: A | Discharge status: Home / Self Care | Payer: Medicaid Other | Source: Ambulatory Visit | Attending: Family Medicine | Admitting: Family Medicine

## 2014-09-30 ENCOUNTER — Encounter: Payer: Self-pay | Admitting: Family Medicine

## 2014-09-30 VITALS — BP 144/95 | HR 93 | Temp 98.4°F | Ht 62.0 in | Wt 202.4 lb

## 2014-09-30 DIAGNOSIS — E1142 Type 2 diabetes mellitus with diabetic polyneuropathy: Secondary | ICD-10-CM | POA: Diagnosis not present

## 2014-09-30 DIAGNOSIS — L700 Acne vulgaris: Secondary | ICD-10-CM | POA: Diagnosis not present

## 2014-09-30 DIAGNOSIS — Z309 Encounter for contraceptive management, unspecified: Secondary | ICD-10-CM | POA: Insufficient documentation

## 2014-09-30 DIAGNOSIS — B3731 Acute candidiasis of vulva and vagina: Secondary | ICD-10-CM

## 2014-09-30 DIAGNOSIS — Z113 Encounter for screening for infections with a predominantly sexual mode of transmission: Secondary | ICD-10-CM | POA: Insufficient documentation

## 2014-09-30 DIAGNOSIS — R002 Palpitations: Secondary | ICD-10-CM | POA: Diagnosis not present

## 2014-09-30 DIAGNOSIS — B373 Candidiasis of vulva and vagina: Secondary | ICD-10-CM

## 2014-09-30 DIAGNOSIS — Z3200 Encounter for pregnancy test, result unknown: Secondary | ICD-10-CM

## 2014-09-30 DIAGNOSIS — I10 Essential (primary) hypertension: Secondary | ICD-10-CM | POA: Diagnosis not present

## 2014-09-30 DIAGNOSIS — Z3009 Encounter for other general counseling and advice on contraception: Secondary | ICD-10-CM | POA: Insufficient documentation

## 2014-09-30 DIAGNOSIS — Z7251 High risk heterosexual behavior: Secondary | ICD-10-CM

## 2014-09-30 LAB — POCT URINE PREGNANCY: Preg Test, Ur: NEGATIVE

## 2014-09-30 LAB — POCT WET PREP (WET MOUNT): Clue Cells Wet Prep Whiff POC: NEGATIVE

## 2014-09-30 MED ORDER — FLUCONAZOLE 150 MG PO TABS: 150.0000 mg | ORAL_TABLET | Freq: Once | ORAL | Status: DC

## 2014-09-30 MED ORDER — NORGESTIMATE-ETH ESTRADIOL 0.25-35 MG-MCG PO TABS: 1.0000 | ORAL_TABLET | Freq: Every day | ORAL | Status: DC

## 2014-09-30 NOTE — Patient Instructions (Addendum)
It was a pleasure seeing you today, Leslie Skinner.  Information regarding what we discussed is included in this packet.  Please make an appointment to see your PCP if symptoms do not improve in the next week or so.  A prescription for birth control has been sent into your pharmacy.  Plan to follow up in 3 months for contraception management/ pap smear.  Remember that birth control pills DO NOT protect against   Please feel free to call our office at [phone] if any questions or concerns arise.  Warm Regards, Maryfer Tauzin M. Lajuana Ripple, DO  Safe Sex Safe sex is about reducing the risk of giving or getting a sexually transmitted disease (STD). STDs are spread through sexual contact involving the genitals, mouth, or rectum. Some STDs can be cured and others cannot. Safe sex can also prevent unintended pregnancies.  WHAT ARE SOME SAFE SEX PRACTICES?  Limit your sexual activity to only one partner who is having sex with only you.  Talk to your partner about his or her past partners, past STDs, and drug use.  Use a condom every time you have sexual intercourse. This includes vaginal, oral, and anal sexual activity. Both females and males should wear condoms during oral sex. Only use latex or polyurethane condoms and water-based lubricants. Using petroleum-based lubricants or oils to lubricate a condom will weaken the condom and increase the chance that it will break. The condom should be in place from the beginning to the end of sexual activity. Wearing a condom reduces, but does not completely eliminate, your risk of getting or giving an STD. STDs can be spread by contact with infected body fluids and skin.  Get vaccinated for hepatitis B and HPV.  Avoid alcohol and recreational drugs, which can affect your judgment. You may forget to use a condom or participate in high-risk sex.  For females, avoid douching after sexual intercourse. Douching can spread an infection farther into the reproductive  tract.  Check your body for signs of sores, blisters, rashes, or unusual discharge. See your health care provider if you notice any of these signs.  Avoid sexual contact if you have symptoms of an infection or are being treated for an STD. If you or your partner has herpes, avoid sexual contact when blisters are present. Use condoms at all other times.  If you are at risk of being infected with HIV, it is recommended that you take a prescription medicine daily to prevent HIV infection. This is called pre-exposure prophylaxis (PrEP). You are considered at risk if:  You are a man who has sex with other men (MSM).  You are a heterosexual man or woman who is sexually active with more than one partner.  You take drugs by injection.  You are sexually active with a partner who has HIV.  Talk with your health care provider about whether you are at high risk of being infected with HIV. If you choose to begin PrEP, you should first be tested for HIV. You should then be tested every 3 months for as long as you are taking PrEP.  See your health care provider for regular screenings, exams, and tests for other STDs. Before having sex with a new partner, each of you should be screened for STDs and should talk about the results with each other. WHAT ARE THE BENEFITS OF SAFE SEX?   There is less chance of getting or giving an STD.  You can prevent unwanted or unintended pregnancies.  By discussing  safe sex concerns with your partner, you may increase feelings of intimacy, comfort, trust, and honesty between the two of you. Document Released: 04/05/2004 Document Revised: 07/13/2013 Document Reviewed: 08/20/2011 [facility] Patient Information 2015 [place], [place]. This information is not intended to replace advice given to you by your health care provider. Make sure you discuss any questions you have with your health care provider.   Monilial Vaginitis Vaginitis in a soreness, swelling and redness  (inflammation) of the vagina and vulva. Monilial vaginitis is not a sexually transmitted infection. CAUSES  Yeast vaginitis is caused by yeast (candida) that is normally found in your vagina. With a yeast infection, the candida has overgrown in number to a point that upsets the chemical balance. SYMPTOMS  White, thick vaginal discharge. Swelling, itching, redness and irritation of the vagina and possibly the lips of the vagina (vulva). Burning or painful urination. Painful intercourse. DIAGNOSIS  Things that may contribute to monilial vaginitis are: Postmenopausal and virginal states. Pregnancy. Infections. Being tired, sick or stressed, especially if you had monilial vaginitis in the past. Diabetes. Good control will help lower the chance. Birth control pills. Tight fitting garments. Using bubble bath, feminine sprays, douches or deodorant tampons. Taking certain medications that kill germs (antibiotics). Sporadic recurrence can occur if you become ill. TREATMENT  Your caregiver will give you medication. There are several kinds of anti monilial vaginal creams and suppositories specific for monilial vaginitis. For recurrent yeast infections, use a suppository or cream in the vagina 2 times a week, or as directed. Anti-monilial or steroid cream for the itching or irritation of the vulva may also be used. Get your caregiver's permission. Painting the vagina with methylene blue solution may help if the monilial cream does not work. Eating yogurt may help prevent monilial vaginitis. HOME CARE INSTRUCTIONS  Finish all medication as prescribed. Do not have sex until treatment is completed or after your caregiver tells you it is okay. Take warm sitz baths. Do not douche. Do not use tampons, especially scented ones. Wear cotton underwear. Avoid tight pants and panty hose. Tell your sexual partner that you have a yeast infection. They should go to their caregiver if they have symptoms such  as mild rash or itching. Your sexual partner should be treated as well if your infection is difficult to eliminate. Practice safer sex. Use condoms. Some vaginal medications cause latex condoms to fail. Vaginal medications that harm condoms are: Cleocin cream. Butoconazole (Femstat). Terconazole (Terazol) vaginal suppository. Miconazole (Monistat) (may be purchased over the counter). SEEK MEDICAL CARE IF:  You have a temperature by mouth above 102 F (38.9 C). The infection is getting worse after 2 days of treatment. The infection is not getting better after 3 days of treatment. You develop blisters in or around your vagina. You develop vaginal bleeding, and it is not your menstrual period. You have pain when you urinate. You develop intestinal problems. You have pain with sexual intercourse. Document Released: 12/06/2004 Document Revised: 05/21/2011 Document Reviewed: 08/20/2008 [facility] Patient Information 2015 [place], [place]. This information is not intended to replace advice given to you by your health care provider. Make sure you discuss any questions you have with your health care provider.

## 2014-09-30 NOTE — Progress Notes (Signed)
Patient ID: Leslie Skinner, female   DOB: [birth year], 19 y.o.   MRN: [id number]    Subjective: CC: Vaginal pain HPI: Patient is a 19 y.o. female presenting to clinic today for same day appt. Concerns today include:  1. Vaginal pain Burning sensation in vaginal opening x2-3 days.  Endorses white chunky discharge.  No odor.  Has not resumed periods.  Sexually active.  Not using condoms.  Is not on Depo anymore.  No concerns for pregnancy but reports having not had a period since being on Depo.  She reports pain with intercourse this past week.  She had to stop having sex.  No plans for children right now.  Is a non smoker.  No h/o or family h/o of clotting disorders.  No new soaps, lotions, laundry detergents.  Social History Reviewed: non smoker. FamHx and MedHx updated.  Please see EMR. Health Maintenance: pap smear due  ROS: All other systems reviewed and are negative.  Objective: Office vital signs reviewed. BP 144/95 mmHg  Pulse 93  Temp(Src) 98.4 F (36.9 C) (Oral)  Ht 5\' 2"  (1.575 m)  Wt 202 lb 6.4 oz (91.808 kg)  BMI 37.01 kg/m2  Physical Examination:  General: Awake, alert, well nourished, NAD HEENT: Normal, EOMI GU: folliculitis appreciated on vaginal mound, external vaginal tissue normal, moderate white discharge from cervical os, no cervical motion TTP, no adnexal masses Extremities: WWP, No edema, cyanosis or clubbing; +2 pulses bilaterally MSK: Normal gait and station  Results for orders placed or performed in visit on 09/30/14 (from the past 24 hour(s))  POCT urine pregnancy     Status: None   Collection Time: 09/30/14  3:50 PM  Result Value Ref Range   Preg Test, Ur Negative Negative  POCT Wet Prep [facility] [place])     Status: Abnormal   Collection Time: 09/30/14  4:00 PM  Result Value Ref Range   Source Wet Prep POC VAG    WBC, Wet Prep HPF POC 5-10    Bacteria Wet Prep HPF POC Many (A) None, Few   Clue Cells Wet Prep HPF POC None None   Clue Cells Wet Prep  Whiff POC Negative Whiff    Yeast Wet Prep HPF POC Moderate    Trichomonas Wet Prep HPF POC NONE     Assessment: 19 y.o. female with yeast infection/ contraception management.  upreg negative.  Wet prep with yeast.  Plan: -Diflucan 150mg  po x1. -GC/CT obtained.  Patient declines HIV testing as she reports this was done 6 months ago. -Sprintec Rx'd x3 months -Safe sex discussed -Patient to follow up in 3 months for pap smear and contraception management with her PCP  Janora Norlander, DO PGY-2, [place]

## 2014-10-01 ENCOUNTER — Other Ambulatory Visit: Payer: Self-pay | Admitting: *Deleted

## 2014-10-01 ENCOUNTER — Other Ambulatory Visit: Payer: Self-pay | Admitting: Pediatric Endocrinology

## 2014-10-01 DIAGNOSIS — I10 Essential (primary) hypertension: Secondary | ICD-10-CM

## 2014-10-01 DIAGNOSIS — L709 Acne, unspecified: Secondary | ICD-10-CM

## 2014-10-01 LAB — CERVICOVAGINAL ANCILLARY ONLY
Chlamydia: NEGATIVE
NEISSERIA GONORRHEA: NEGATIVE

## 2014-10-01 MED ORDER — HYDROCHLOROTHIAZIDE 12.5 MG PO CAPS
12.5000 mg | ORAL_CAPSULE | Freq: Every day | ORAL | Status: DC
Start: 2014-10-01 — End: 2015-07-11

## 2014-10-01 MED ORDER — BENZAMYCIN 5-3 % EX GEL: CUTANEOUS | Status: DC

## 2014-10-07 ENCOUNTER — Telehealth: Payer: Self-pay | Admitting: *Deleted

## 2014-10-07 NOTE — Telephone Encounter (Signed)
Prior Authorization received from [facility] for Benzamycin gel. Formulary and PA form placed in provider box for completion. Derl Barrow, RN

## 2014-10-08 ENCOUNTER — Other Ambulatory Visit: Payer: Self-pay | Admitting: Internal Medicine

## 2014-10-08 DIAGNOSIS — L709 Acne, unspecified: Secondary | ICD-10-CM

## 2014-10-08 MED ORDER — CLINDAMYCIN PHOS-BENZOYL PEROX 1-5 % EX GEL: CUTANEOUS | Status: DC

## 2014-10-11 NOTE — Telephone Encounter (Signed)
Completed and left in [place] office.

## 2014-11-15 ENCOUNTER — Other Ambulatory Visit: Payer: Self-pay | Admitting: Pediatric Endocrinology

## 2014-12-06 ENCOUNTER — Ambulatory Visit: Payer: Medicaid Other | Admitting: Obstetrics and Gynecology

## 2014-12-13 ENCOUNTER — Ambulatory Visit (INDEPENDENT_AMBULATORY_CARE_PROVIDER_SITE_OTHER): Payer: Medicaid Other | Admitting: Family Medicine

## 2014-12-13 ENCOUNTER — Other Ambulatory Visit (HOSPITAL_COMMUNITY)
Admission: RE | Admit: 2014-12-13 | Discharge: 2014-12-13 | Disposition: A | Discharge status: Home / Self Care | Payer: Medicaid Other | Source: Ambulatory Visit | Attending: Family Medicine | Admitting: Family Medicine

## 2014-12-13 ENCOUNTER — Encounter: Payer: Self-pay | Admitting: Family Medicine

## 2014-12-13 VITALS — BP 139/74 | HR 86 | Temp 98.5°F | Ht 62.0 in | Wt 208.0 lb

## 2014-12-13 DIAGNOSIS — Z113 Encounter for screening for infections with a predominantly sexual mode of transmission: Secondary | ICD-10-CM

## 2014-12-13 DIAGNOSIS — L700 Acne vulgaris: Secondary | ICD-10-CM | POA: Diagnosis not present

## 2014-12-13 DIAGNOSIS — I10 Essential (primary) hypertension: Secondary | ICD-10-CM | POA: Diagnosis not present

## 2014-12-13 DIAGNOSIS — R002 Palpitations: Secondary | ICD-10-CM | POA: Diagnosis present

## 2014-12-13 DIAGNOSIS — R102 Pelvic and perineal pain: Secondary | ICD-10-CM

## 2014-12-13 DIAGNOSIS — E1142 Type 2 diabetes mellitus with diabetic polyneuropathy: Secondary | ICD-10-CM | POA: Diagnosis not present

## 2014-12-13 DIAGNOSIS — R631 Polydipsia: Secondary | ICD-10-CM

## 2014-12-13 DIAGNOSIS — Z3009 Encounter for other general counseling and advice on contraception: Secondary | ICD-10-CM

## 2014-12-13 DIAGNOSIS — Z23 Encounter for immunization: Secondary | ICD-10-CM

## 2014-12-13 DIAGNOSIS — N3000 Acute cystitis without hematuria: Secondary | ICD-10-CM

## 2014-12-13 DIAGNOSIS — N898 Other specified noninflammatory disorders of vagina: Secondary | ICD-10-CM

## 2014-12-13 LAB — POCT UA - MICROSCOPIC ONLY

## 2014-12-13 LAB — POCT URINALYSIS DIPSTICK
Bilirubin, UA: NEGATIVE
GLUCOSE UA: NEGATIVE
Ketones, UA: NEGATIVE
LEUKOCYTES UA: NEGATIVE
NITRITE UA: POSITIVE
Protein, UA: NEGATIVE
RBC UA: NEGATIVE
Spec Grav, UA: 1.025
Urobilinogen, UA: 0.2
pH, UA: 7

## 2014-12-13 LAB — POCT WET PREP (WET MOUNT): Clue Cells Wet Prep Whiff POC: POSITIVE

## 2014-12-13 LAB — COMPREHENSIVE METABOLIC PANEL
ALBUMIN: 4.2 g/dL (ref 3.6–5.1)
ALK PHOS: 108 U/L (ref 33–115)
ALT: 172 U/L — AB (ref 6–29)
AST: 91 U/L — AB (ref 10–30)
BILIRUBIN TOTAL: 0.9 mg/dL (ref 0.2–1.2)
BUN: 12 mg/dL (ref 7–25)
CO2: 27 mmol/L (ref 20–31)
CREATININE: 0.71 mg/dL (ref 0.50–1.10)
Calcium: 9.5 mg/dL (ref 8.6–10.2)
Chloride: 101 mmol/L (ref 98–110)
Glucose, Bld: 80 mg/dL (ref 65–99)
Potassium: 3.9 mmol/L (ref 3.5–5.3)
SODIUM: 139 mmol/L (ref 135–146)
TOTAL PROTEIN: 7.2 g/dL (ref 6.1–8.1)

## 2014-12-13 LAB — POCT URINE PREGNANCY: Preg Test, Ur: NEGATIVE

## 2014-12-13 MED ORDER — CEPHALEXIN 500 MG PO CAPS: 500.0000 mg | ORAL_CAPSULE | Freq: Three times a day (TID) | ORAL | Status: DC

## 2014-12-13 MED ORDER — METRONIDAZOLE 500 MG PO TABS: 500.0000 mg | ORAL_TABLET | Freq: Two times a day (BID) | ORAL | Status: DC

## 2014-12-13 NOTE — Patient Instructions (Signed)
It was a pleasure seeing you today, Leslie Skinner.  Information regarding what we discussed is included in this packet.  You may start the birth control pill this Sunday.  Please make an appointment to see your PCP after your 89 birthday for pap smear.  I will contact you will the results of your labs.  If anything is abnormal, I will call you.  Otherwise, expect a copy to be mailed to you.  Please feel free to call our office at [phone] if any questions or concerns arise.  Warm Regards, Dain Laseter M. Dareen Gutzwiller, DO  Urinary Tract Infection Urinary tract infections (UTIs) can develop anywhere along your urinary tract. Your urinary tract is your body's drainage system for removing wastes and extra water. Your urinary tract includes two kidneys, two ureters, a bladder, and a urethra. Your kidneys are a pair of bean-shaped organs. Each kidney is about the size of your fist. They are located below your ribs, one on each side of your spine. CAUSES Infections are caused by microbes, which are microscopic organisms, including fungi, viruses, and bacteria. These organisms are so small that they can only be seen through a microscope. Bacteria are the microbes that most commonly cause UTIs. SYMPTOMS  Symptoms of UTIs may vary by age and gender of the patient and by the location of the infection. Symptoms in young women typically include a frequent and intense urge to urinate and a painful, burning feeling in the bladder or urethra during urination. Older women and men are more likely to be tired, shaky, and weak and have muscle aches and abdominal pain. A fever may mean the infection is in your kidneys. Other symptoms of a kidney infection include pain in your back or sides below the ribs, nausea, and vomiting. DIAGNOSIS To diagnose a UTI, your caregiver will ask you about your symptoms. Your caregiver also will ask to provide a urine sample. The urine sample will be tested for bacteria and white blood cells.  White blood cells are made by your body to help fight infection. TREATMENT  Typically, UTIs can be treated with medication. Because most UTIs are caused by a bacterial infection, they usually can be treated with the use of antibiotics. The choice of antibiotic and length of treatment depend on your symptoms and the type of bacteria causing your infection. HOME CARE INSTRUCTIONS  If you were prescribed antibiotics, take them exactly as your caregiver instructs you. Finish the medication even if you feel better after you have only taken some of the medication.  Drink enough water and fluids to keep your urine clear or pale yellow.  Avoid caffeine, tea, and carbonated beverages. They tend to irritate your bladder.  Empty your bladder often. Avoid holding urine for long periods of time.  Empty your bladder before and after sexual intercourse.  After a bowel movement, women should cleanse from front to back. Use each tissue only once. SEEK MEDICAL CARE IF:   You have back pain.  You develop a fever.  Your symptoms do not begin to resolve within 3 days. SEEK IMMEDIATE MEDICAL CARE IF:   You have severe back pain or lower abdominal pain.  You develop chills.  You have nausea or vomiting.  You have continued burning or discomfort with urination. MAKE SURE YOU:   Understand these instructions.  Will watch your condition.  Will get help right away if you are not doing well or get worse. Document Released: 12/06/2004 Document Revised: 08/28/2011 Document Reviewed: 04/06/2011 ExitCare  Patient Information 2015 [place]. This information is not intended to replace advice given to you by your health care provider. Make sure you discuss any questions you have with your health care provider.

## 2014-12-13 NOTE — Progress Notes (Signed)
Subjective: CC: pelvic pain HPI: Patient is a 19 y.o. female presenting to clinic today for same day appointment. Concerns today include:  1. Pelvic pain/ Vaginal discharge Patient reports last time she douched was about 1 month ago.  She continues to have intermittent episodes of fishy smell, especially after intercourse.  She also notes a strong odor to her urine on a daily basis.  Urine is dark.  Occasional dysuria, polyuria.  Patient notes that she is thirsty all the time.  No hematuria, fevers, chills, nausea or vomiting, no back pain.  Patient uses condoms every time.  No concern for STI but would like GC/CT checked.  2. Contraception management Patient did not pick up OCP in July.  Has been using condoms for protection.  Still has not gotten period since discontinuing depo shot.  Denies nausea, vomiting.  Social History Reviewed: non smoker. FamHx and MedHx updated.  Please see EMR. Health Maintenance: flu shot due  ROS: All other systems reviewed and are negative.  Objective: Office vital signs reviewed. BP 139/74 mmHg  Pulse 86  Temp(Src) 98.5 F (36.9 C) (Oral)  Ht 5\' 2"  (1.575 m)  Wt 208 lb (94.348 kg)  BMI 38.03 kg/m2  LMP   Physical Examination:  General: Awake, alert, obese, NAD Cardio: rate normal Pulm: normal WOB GU: external vaginal tissue normal in appearance, cervix visualized: no punctate lesions appreciated, moderate leukorrhea from cervical os, no bleeding, no odor, no cervical motion TTP, no adnexal masses Extremities: WWP, No edema, cyanosis or clubbing; +2 pulses bilaterally  Results for orders placed or performed in visit on 12/13/14 (from the past 24 hour(s))  POCT urinalysis dipstick     Status: Abnormal   Collection Time: 12/13/14  9:45 AM  Result Value Ref Range   Color, UA YELLOW    Clarity, UA CLEAR    Glucose, UA NEG    Bilirubin, UA NEG    Ketones, UA NEG    Spec Grav, UA 1.025    Blood, UA NEG    pH, UA 7.0    Protein, UA NEG      Urobilinogen, UA 0.2    Nitrite, UA POSITIVE    Leukocytes, UA Negative Negative   Narrative   Reflex to microscopic   POCT urine pregnancy     Status: None   Collection Time: 12/13/14  9:45 AM  Result Value Ref Range   Preg Test, Ur Negative Negative  POCT UA - Microscopic Only     Status: Abnormal   Collection Time: 12/13/14  9:45 AM  Result Value Ref Range   WBC, Ur, HPF, POC 0-3    RBC, urine, microscopic NONE    Bacteria, U Microscopic 3+ RODS    Epithelial cells, urine per micros OCCASIONAL   POCT Wet Prep [facility] [place])     Status: Abnormal   Collection Time: 12/13/14 10:15 AM  Result Value Ref Range   Source Wet Prep POC VAG    WBC, Wet Prep HPF POC 5-10    Bacteria Wet Prep HPF POC Many (A) None, Few   Clue Cells Wet Prep HPF POC Moderate (A) None   Clue Cells Wet Prep Whiff POC Positive Whiff    Yeast Wet Prep HPF POC None    Trichomonas Wet Prep HPF POC NONE    Assessment/ Plan: 19 y.o. female with  1. Vaginal discharge.  Evidence of BV on wet prep - POCT Wet Prep ([place]) - GC/Chlamydia probe amp ([place])not at  [facility] - Metronidazole 500mg  BID x7 days - Return if no improvement  2. Pelvic pain in female.  Suspect discomfort from UTI/ BV.   - POCT urinalysis dipstick - POCT urine pregnancy - POCT UA - Microscopic Only - Urine culture - POCT Wet Prep ([place]) - GC/Chlamydia probe amp ([place])not at [facility]  3. Acute cystitis without hematuria.  Nitrite + on UA.  No blood.   - cephALEXin (KEFLEX) 500 MG capsule; Take 1 capsule (500 mg total) by mouth 3 (three) times daily. x10 days  Dispense: 30 capsule; Refill: 0 - urine culture sent - Return precautions reviewed.  4. Screen for STD (sexually transmitted disease) - GC/Chlamydia probe amp ([place])not at [facility]  5. Encounter for other general counseling or advice on contraception - Patient to start OCP on Sunday - F/u with PCP in 1 month for pap and OCP check up.  6. Polydipsia.  Would  like to r/o DM2. - Comprehensive metabolic panel - Follow up with PCP if continued symptoms.   Janora Norlander, DO PGY-2, [place]

## 2014-12-14 ENCOUNTER — Encounter: Payer: Self-pay | Admitting: Family

## 2014-12-14 ENCOUNTER — Other Ambulatory Visit: Payer: Self-pay | Admitting: Family Medicine

## 2014-12-14 ENCOUNTER — Telehealth: Payer: Self-pay | Admitting: Family Medicine

## 2014-12-14 DIAGNOSIS — R945 Abnormal results of liver function studies: Principal | ICD-10-CM

## 2014-12-14 DIAGNOSIS — R7989 Other specified abnormal findings of blood chemistry: Secondary | ICD-10-CM | POA: Insufficient documentation

## 2014-12-14 LAB — GC/CHLAMYDIA PROBE AMP (~~LOC~~) NOT AT ARMC
CHLAMYDIA, DNA PROBE: NEGATIVE
Neisseria Gonorrhea: NEGATIVE

## 2014-12-14 NOTE — Telephone Encounter (Signed)
Discussed LFTs with patient.  Have placed future order for repeat LFTs and hepatitis panel.  Patient scheduled with lab 10/10.  Message routed to PCP with information.  Ashly M. Lajuana Ripple, DO PGY-2, [place]

## 2014-12-15 LAB — URINE CULTURE: Colony Count: >=100000

## 2014-12-20 ENCOUNTER — Other Ambulatory Visit: Payer: Medicaid Other

## 2014-12-27 ENCOUNTER — Other Ambulatory Visit: Payer: Self-pay | Admitting: Internal Medicine

## 2014-12-27 DIAGNOSIS — E1142 Type 2 diabetes mellitus with diabetic polyneuropathy: Secondary | ICD-10-CM

## 2014-12-27 NOTE — Telephone Encounter (Signed)
Pt needs a refill on (ACCU-CHEK SMARTVIEW) test strip. Thank you, Fonda Kinder, ASA

## 2014-12-28 MED ORDER — GLUCOSE BLOOD VI STRP: ORAL_STRIP | Status: DC

## 2015-01-14 ENCOUNTER — Other Ambulatory Visit: Payer: Self-pay | Admitting: Pediatric Endocrinology

## 2015-02-08 ENCOUNTER — Other Ambulatory Visit: Payer: Self-pay | Admitting: Internal Medicine

## 2015-02-08 DIAGNOSIS — E1142 Type 2 diabetes mellitus with diabetic polyneuropathy: Secondary | ICD-10-CM

## 2015-02-08 NOTE — Telephone Encounter (Signed)
Pt called and needs a refill on her Test strips called in. jw

## 2015-02-09 MED ORDER — GLUCOSE BLOOD VI STRP: ORAL_STRIP | Status: DC

## 2015-04-19 ENCOUNTER — Ambulatory Visit: Payer: Medicaid Other | Admitting: Family Medicine

## 2015-04-28 ENCOUNTER — Ambulatory Visit: Payer: Medicaid Other | Admitting: Family Medicine

## 2015-05-02 ENCOUNTER — Ambulatory Visit: Payer: Medicaid Other

## 2015-05-06 ENCOUNTER — Ambulatory Visit: Payer: Medicaid Other

## 2015-05-06 ENCOUNTER — Ambulatory Visit: Payer: Medicaid Other | Admitting: Internal Medicine

## 2015-06-07 ENCOUNTER — Encounter: Payer: Self-pay | Admitting: Family Medicine

## 2015-06-07 ENCOUNTER — Ambulatory Visit (INDEPENDENT_AMBULATORY_CARE_PROVIDER_SITE_OTHER): Payer: Self-pay | Admitting: Family Medicine

## 2015-06-07 VITALS — BP 126/62 | HR 78 | Temp 98.1°F | Ht 62.0 in | Wt 217.2 lb

## 2015-06-07 DIAGNOSIS — R300 Dysuria: Secondary | ICD-10-CM

## 2015-06-07 LAB — POCT URINALYSIS DIPSTICK
BILIRUBIN UA: NEGATIVE
Glucose, UA: NEGATIVE
Leukocytes, UA: NEGATIVE
Nitrite, UA: NEGATIVE
PH UA: 6
Protein, UA: 30
SPEC GRAV UA: 1.025
Urobilinogen, UA: 0.2

## 2015-06-07 LAB — POCT UA - MICROSCOPIC ONLY

## 2015-06-07 MED ORDER — CEFPODOXIME PROXETIL 100 MG PO TABS: 100.0000 mg | ORAL_TABLET | Freq: Two times a day (BID) | ORAL | Status: DC

## 2015-06-07 NOTE — Patient Instructions (Signed)
Thank you for coming to see me today. It was a pleasure. Today we talked about:   Painful urination: I will treat you with an antibiotic for 7 days. I am getting a urine culture to make sure there are no bacteria growing in your urine.   If you have any questions or concerns, please do not hesitate to call the office at [phone].  Sincerely,  Cordelia Poche, MD

## 2015-06-07 NOTE — Progress Notes (Signed)
    Subjective   Leslie Skinner is a 20 y.o. female that presents for a same day visit  1. Dysuria: Symptoms started a few months ago. She reports dysuria, hesitancy, itching, frequency. She has been trying cranberry juice and cranberry chewable tablets. No associated fevers, nausea or vomiting. She reports no recent antibiotic usage.   ROS Per HPI  Social History  Substance Use Topics  . Smoking status: Passive Smoke Exposure - Never Smoker  . Smokeless tobacco: Never Used  . Alcohol Use: No    Allergies  Allergen Reactions  . Morphine And Related Other (See Comments)    Hot flashes, can't breathe    Objective   BP 126/62 mmHg  Pulse 78  Temp(Src) 98.1 F (36.7 C) (Oral)  Ht 5\' 2"  (1.575 m)  Wt 217 lb 3.2 oz (98.521 kg)  BMI 39.72 kg/m2  LMP 06/05/2015  General: Well appearing, no distress Gastrointestinal: No suprapubic tenderness  Assessment and Plan   1. Dysuria Will treat as UTI. Will follow-up urine culture. Hemoglobin on urine likely secondary to patient on menstrual cycle. - Urinalysis Dipstick - POCT UA - Microscopic Only - cefpodoxime (VANTIN) 100 MG tablet; Take 1 tablet (100 mg total) by mouth 2 (two) times daily.  Dispense: 14 tablet; Refill: 0 - Urine culture

## 2015-06-10 LAB — URINE CULTURE

## 2015-06-13 ENCOUNTER — Telehealth: Payer: Self-pay

## 2015-06-13 DIAGNOSIS — N3000 Acute cystitis without hematuria: Secondary | ICD-10-CM

## 2015-06-13 MED ORDER — CEPHALEXIN 500 MG PO CAPS: 500.0000 mg | ORAL_CAPSULE | Freq: Three times a day (TID) | ORAL | Status: DC

## 2015-06-13 NOTE — Telephone Encounter (Signed)
LVM for pt to call the office. Need to notify of lab results show UTI. Dr. Lonny Prude already gave antibiotics. Ottis Stain, CMA

## 2015-06-13 NOTE — Addendum Note (Signed)
Addended by: Cordelia Poche A on: 06/13/2015 01:23 PM   Modules accepted: Orders, Medications

## 2015-06-13 NOTE — Telephone Encounter (Signed)
Patient informed. Sharon T Saunders, CMA  

## 2015-06-13 NOTE — Telephone Encounter (Signed)
Sent new prescription for Keflex 500mg  three times daily for a total duration of 7 days of treatment.

## 2015-06-13 NOTE — Telephone Encounter (Signed)
Pt is returning Leslie Skinner's call. She is aware that she has a UTI. The problem is she has no insurance and was hoping that we can send a new prescription to the [place] [facility] for an antibiotic that is on the 4.00 list. [facility]. Please call patient and let her know what to do. jw

## 2015-07-04 ENCOUNTER — Telehealth: Payer: Self-pay | Admitting: Internal Medicine

## 2015-07-04 ENCOUNTER — Ambulatory Visit: Payer: Medicaid Other | Admitting: Family Medicine

## 2015-07-11 ENCOUNTER — Telehealth: Payer: Self-pay | Admitting: Family Medicine

## 2015-07-11 ENCOUNTER — Encounter: Payer: Self-pay | Admitting: Family Medicine

## 2015-07-11 ENCOUNTER — Ambulatory Visit (INDEPENDENT_AMBULATORY_CARE_PROVIDER_SITE_OTHER): Payer: Self-pay | Admitting: Family Medicine

## 2015-07-11 ENCOUNTER — Other Ambulatory Visit (HOSPITAL_COMMUNITY)
Admission: RE | Admit: 2015-07-11 | Discharge: 2015-07-11 | Disposition: A | Discharge status: Home / Self Care | Payer: Medicaid Other | Source: Ambulatory Visit | Attending: Family Medicine | Admitting: Family Medicine

## 2015-07-11 VITALS — BP 140/88 | HR 85 | Temp 97.9°F | Wt 219.0 lb

## 2015-07-11 DIAGNOSIS — N760 Acute vaginitis: Secondary | ICD-10-CM | POA: Insufficient documentation

## 2015-07-11 DIAGNOSIS — R945 Abnormal results of liver function studies: Secondary | ICD-10-CM

## 2015-07-11 DIAGNOSIS — Z124 Encounter for screening for malignant neoplasm of cervix: Secondary | ICD-10-CM

## 2015-07-11 DIAGNOSIS — N898 Other specified noninflammatory disorders of vagina: Secondary | ICD-10-CM

## 2015-07-11 DIAGNOSIS — R740 Nonspecific elevation of levels of transaminase and lactic acid dehydrogenase [LDH]: Secondary | ICD-10-CM

## 2015-07-11 DIAGNOSIS — I10 Essential (primary) hypertension: Secondary | ICD-10-CM

## 2015-07-11 DIAGNOSIS — Z113 Encounter for screening for infections with a predominantly sexual mode of transmission: Secondary | ICD-10-CM

## 2015-07-11 DIAGNOSIS — R7989 Other specified abnormal findings of blood chemistry: Secondary | ICD-10-CM

## 2015-07-11 DIAGNOSIS — Z319 Encounter for procreative management, unspecified: Secondary | ICD-10-CM

## 2015-07-11 DIAGNOSIS — Z01419 Encounter for gynecological examination (general) (routine) without abnormal findings: Secondary | ICD-10-CM | POA: Insufficient documentation

## 2015-07-11 DIAGNOSIS — Z111 Encounter for screening for respiratory tuberculosis: Secondary | ICD-10-CM

## 2015-07-11 DIAGNOSIS — R7401 Elevation of levels of liver transaminase levels: Secondary | ICD-10-CM

## 2015-07-11 LAB — POCT WET PREP (WET MOUNT): Clue Cells Wet Prep Whiff POC: POSITIVE

## 2015-07-11 LAB — HEPATITIS PANEL, ACUTE
HCV AB: NEGATIVE
HEP B S AG: NEGATIVE
Hep A IgM: NONREACTIVE
Hep B C IgM: NONREACTIVE

## 2015-07-11 LAB — POCT URINE PREGNANCY: Preg Test, Ur: NEGATIVE

## 2015-07-11 MED ORDER — FLUCONAZOLE 150 MG PO TABS: ORAL_TABLET | ORAL | Status: DC

## 2015-07-11 NOTE — Telephone Encounter (Signed)
**  After Hours/ Emergency Line Call*  Received a call to report that [facility] had not received her Diflucan prescription yet.  She was seen today in clinic.  Per her AVS, it appears she was to be prescribed diflucan.  Take 1 tablet now.  May repeat in 3 days.  Rx sent into [facility].   Will forward to PCP.  1. Vaginal discharge - fluconazole (DIFLUCAN) 150 MG tablet; Take 1 tablet now.  If symptoms do not resolve, you may repeat dose in 3 days.  Dispense: 2 tablet; Refill: 0  Tekoa Hamor M. Lajuana Ripple, DO PGY-2, [place]

## 2015-07-11 NOTE — Progress Notes (Signed)
Date of Visit: 07/11/2015   HPI:  Patient presents today for a well woman exam.   Concerns today: see below Periods: monthly, regular, no intermenstrual bleeding Contraception: trying to conceive actively. Stopped depo last July Pelvic symptoms: pelvic pain only during period Sexual activity: one female partner, no protection STD Screening: no concerns for STDs but is ok with testing today Pap smear status: has never had, will get today Exercise: exercises at work, walks Diet: working on healthy eating Smoking: black and milds about once every 1-2 weeks Alcohol: occasional Drugs: none Mood: good overall  Having vaginal itching & burning. Also some pain with intercourse. Took antibiotics in March for a UTI.  Actively trying to conceive. Stopped depo last July but hasn't been able to get pregnant yet. LMP ended yesterday, started Tuesday of last week.   ROS: See HPI  [place]:  Cancers in family: breast cancer in aunt, died in her 38s-40s. Mom with cervical cancer. No other family history of cancers.   Past medical history: elevated LFTs, hypertension, migraine without aura, obesity, prediabetes, acne, GERD  PHYSICAL EXAM: BP 140/88 mmHg  Pulse 85  Temp(Src) 97.9 F (36.6 C) (Oral)  Wt 219 lb (99.338 kg)  LMP 07/04/2015 Gen: NAD, pleasant, cooperative HEENT: NCAT, PERRL, no palpable thyromegaly or anterior cervical lymphadenopathy Heart: RRR, no murmurs Lungs: CTAB, NWOB Abdomen: soft, nontender to palpation Neuro: grossly nonfocal, speech normal GU: normal appearing external genitalia without lesions. Vagina is moist with white discharge. Cervix normal in appearance. No cervical motion tenderness or tenderness on bimanual exam. No adnexal masses.   ASSESSMENT/PLAN:  # Health maintenance:  -STD screening: check gc/chl/trich today with pap, also HIV, RPR, acute hepatitis panel -pap smear: done todya -lipid screening: check lipids & CMET today -handout given on health  maintenance topics  Desire for pregnancy Advised to wait an entire year after stopping depo & trying for pregnancy. If still not pregnant, patient is to call us & we can refer her to a fertility specialist.   Hypertension Not on medications. Blood pressure 140/88 today. Hold on treating for now since it is so close to goal. Check CMET today.  Vaginal discharge Symptoms consistent with yeast. Recent antibiotic exposure. Will treat with diflucan 150mg  x1, repeat in 3 days if not better. Check wet prep & STDs.   Elevated liver function tests Check CMET today.    FOLLOW UP: Follow up as needed.  [place]. Ardelia Mems, [place]

## 2015-07-11 NOTE — Patient Instructions (Addendum)
Sent in medicine for yeast infection Take one pill now, repeat in 3 days if not better  If you are still having trouble getting pregnant in a few months please call us and we can refer you to a fertility specialist.  Checking labs today - STDs, pap, kidneys, liver, cholesterol  Be well, Dr. Ardelia Mems    Health Maintenance, Female Adopting a healthy lifestyle and getting preventive care can go a long way to promote health and wellness. Talk with your health care provider about what schedule of regular examinations is right for you. This is a good chance for you to check in with your provider about disease prevention and staying healthy. In between checkups, there are plenty of things you can do on your own. Experts have done a lot of research about which lifestyle changes and preventive measures are most likely to keep you healthy. Ask your health care provider for more information. WEIGHT AND DIET  Eat a healthy diet  Be sure to include plenty of vegetables, fruits, low-fat dairy products, and lean protein.  Do not eat a lot of foods high in solid fats, added sugars, or salt.  Get regular exercise. This is one of the most important things you can do for your health.  Most adults should exercise for at least 150 minutes each week. The exercise should increase your heart rate and make you sweat (moderate-intensity exercise).  Most adults should also do strengthening exercises at least twice a week. This is in addition to the moderate-intensity exercise.  Maintain a healthy weight  Body mass index (BMI) is a measurement that can be used to identify possible weight problems. It estimates body fat based on height and weight. Your health care provider can help determine your BMI and help you achieve or maintain a healthy weight.  For females 47 years of age and older:   A BMI below 18.5 is considered underweight.  A BMI of 18.5 to 24.9 is normal.  A BMI of 25 to 29.9 is considered  overweight.  A BMI of 30 and above is considered obese.  Watch levels of cholesterol and blood lipids  You should start having your blood tested for lipids and cholesterol at 22 years of age, then have this test every 5 years.  You may need to have your cholesterol levels checked more often if:  Your lipid or cholesterol levels are high.  You are older than 22 years of age.  You are at high risk for heart disease.  CANCER SCREENING   Lung Cancer  Lung cancer screening is recommended for adults 60-77 years old who are at high risk for lung cancer because of a history of smoking.  A yearly low-dose CT scan of the lungs is recommended for people who:  Currently smoke.  Have quit within the past 15 years.  Have at least a 30-pack-year history of smoking. A pack year is smoking an average of one pack of cigarettes a day for 1 year.  Yearly screening should continue until it has been 15 years since you quit.  Yearly screening should stop if you develop a health problem that would prevent you from having lung cancer treatment.  Breast Cancer  Practice breast self-awareness. This means understanding how your breasts normally appear and feel.  It also means doing regular breast self-exams. Let your health care provider know about any changes, no matter how small.  If you are in your 20s or 30s, you should have a clinical breast  exam (CBE) by a health care provider every 1-3 years as part of a regular health exam.  If you are 69 or older, have a CBE every year. Also consider having a breast X-ray (mammogram) every year.  If you have a family history of breast cancer, talk to your health care provider about genetic screening.  If you are at high risk for breast cancer, talk to your health care provider about having an MRI and a mammogram every year.  Breast cancer gene (BRCA) assessment is recommended for women who have family members with BRCA-related cancers. BRCA-related  cancers include:  Breast.  Ovarian.  Tubal.  Peritoneal cancers.  Results of the assessment will determine the need for genetic counseling and BRCA1 and BRCA2 testing. Cervical Cancer Your health care provider may recommend that you be screened regularly for cancer of the pelvic organs (ovaries, uterus, and vagina). This screening involves a pelvic examination, including checking for microscopic changes to the surface of your cervix (Pap test). You may be encouraged to have this screening done every 3 years, beginning at age 58.  For women ages 1-65, health care providers may recommend pelvic exams and Pap testing every 3 years, or they may recommend the Pap and pelvic exam, combined with testing for human papilloma virus (HPV), every 5 years. Some types of HPV increase your risk of cervical cancer. Testing for HPV may also be done on women of any age with unclear Pap test results.  Other health care providers may not recommend any screening for nonpregnant women who are considered low risk for pelvic cancer and who do not have symptoms. Ask your health care provider if a screening pelvic exam is right for you.  If you have had past treatment for cervical cancer or a condition that could lead to cancer, you need Pap tests and screening for cancer for at least 20 years after your treatment. If Pap tests have been discontinued, your risk factors (such as having a new sexual partner) need to be reassessed to determine if screening should resume. Some women have medical problems that increase the chance of getting cervical cancer. In these cases, your health care provider may recommend more frequent screening and Pap tests. Colorectal Cancer  This type of cancer can be detected and often prevented.  Routine colorectal cancer screening usually begins at 22 years of age and continues through 22 years of age.  Your health care provider may recommend screening at an earlier age if you have risk  factors for colon cancer.  Your health care provider may also recommend using home test kits to check for hidden blood in the stool.  A small camera at the end of a tube can be used to examine your colon directly (sigmoidoscopy or colonoscopy). This is done to check for the earliest forms of colorectal cancer.  Routine screening usually begins at age 26.  Direct examination of the colon should be repeated every 5-10 years through 22 years of age. However, you may need to be screened more often if early forms of precancerous polyps or small growths are found. Skin Cancer  Check your skin from head to toe regularly.  Tell your health care provider about any new moles or changes in moles, especially if there is a change in a mole's shape or color.  Also tell your health care provider if you have a mole that is larger than the size of a pencil eraser.  Always use sunscreen. Apply sunscreen liberally and repeatedly  throughout the day.  Protect yourself by wearing long sleeves, pants, a wide-brimmed hat, and sunglasses whenever you are outside. HEART DISEASE, DIABETES, AND HIGH BLOOD PRESSURE   High blood pressure causes heart disease and increases the risk of stroke. High blood pressure is more likely to develop in:  People who have blood pressure in the high end of the normal range (130-139/85-89 mm Hg).  People who are overweight or obese.  People who are African American.  If you are 13-51 years of age, have your blood pressure checked every 3-5 years. If you are 46 years of age or older, have your blood pressure checked every year. You should have your blood pressure measured twice--once when you are at a hospital or clinic, and once when you are not at a hospital or clinic. Record the average of the two measurements. To check your blood pressure when you are not at a hospital or clinic, you can use:  An automated blood pressure machine at a pharmacy.  A home blood pressure  monitor.  If you are between 19 years and 24 years old, ask your health care provider if you should take aspirin to prevent strokes.  Have regular diabetes screenings. This involves taking a blood sample to check your fasting blood sugar level.  If you are at a normal weight and have a low risk for diabetes, have this test once every three years after 22 years of age.  If you are overweight and have a high risk for diabetes, consider being tested at a younger age or more often. PREVENTING INFECTION  Hepatitis B  If you have a higher risk for hepatitis B, you should be screened for this virus. You are considered at high risk for hepatitis B if:  You were born in a country where hepatitis B is common. Ask your health care provider which countries are considered high risk.  Your parents were born in a high-risk country, and you have not been immunized against hepatitis B (hepatitis B vaccine).  You have HIV or AIDS.  You use needles to inject street drugs.  You live with someone who has hepatitis B.  You have had sex with someone who has hepatitis B.  You get hemodialysis treatment.  You take certain medicines for conditions, including cancer, organ transplantation, and autoimmune conditions. Hepatitis C  Blood testing is recommended for:  Everyone born from [birth year] through 1967.  Anyone with known risk factors for hepatitis C. Sexually transmitted infections (STIs)  You should be screened for sexually transmitted infections (STIs) including gonorrhea and chlamydia if:  You are sexually active and are younger than 22 years of age.  You are older than 22 years of age and your health care provider tells you that you are at risk for this type of infection.  Your sexual activity has changed since you were last screened and you are at an increased risk for chlamydia or gonorrhea. Ask your health care provider if you are at risk.  If you do not have HIV, but are at risk, it may be  recommended that you take a prescription medicine daily to prevent HIV infection. This is called pre-exposure prophylaxis (PrEP). You are considered at risk if:  You are sexually active and do not regularly use condoms or know the HIV status of your partner(s).  You take drugs by injection.  You are sexually active with a partner who has HIV. Talk with your health care provider about whether you are at high  risk of being infected with HIV. If you choose to begin PrEP, you should first be tested for HIV. You should then be tested every 3 months for as long as you are taking PrEP.  PREGNANCY   If you are premenopausal and you may become pregnant, ask your health care provider about preconception counseling.  If you may become pregnant, take 400 to 800 micrograms (mcg) of folic acid every day.  If you want to prevent pregnancy, talk to your health care provider about birth control (contraception). OSTEOPOROSIS AND MENOPAUSE   Osteoporosis is a disease in which the bones lose minerals and strength with aging. This can result in serious bone fractures. Your risk for osteoporosis can be identified using a bone density scan.  If you are 37 years of age or older, or if you are at risk for osteoporosis and fractures, ask your health care provider if you should be screened.  Ask your health care provider whether you should take a calcium or vitamin D supplement to lower your risk for osteoporosis.  Menopause may have certain physical symptoms and risks.  Hormone replacement therapy may reduce some of these symptoms and risks. Talk to your health care provider about whether hormone replacement therapy is right for you.  HOME CARE INSTRUCTIONS   Schedule regular health, dental, and eye exams.  Stay current with your immunizations.   Do not use any tobacco products including cigarettes, chewing tobacco, or electronic cigarettes.  If you are pregnant, do not drink alcohol.  If you are  breastfeeding, limit how much and how often you drink alcohol.  Limit alcohol intake to no more than 1 drink per day for nonpregnant women. One drink equals 12 ounces of beer, 5 ounces of wine, or 1 ounces of hard liquor.  Do not use street drugs.  Do not share needles.  Ask your health care provider for help if you need support or information about quitting drugs.  Tell your health care provider if you often feel depressed.  Tell your health care provider if you have ever been abused or do not feel safe at home.   This information is not intended to replace advice given to you by your health care provider. Make sure you discuss any questions you have with your health care provider.   Document Released: 09/11/2010 Document Revised: 03/19/2014 Document Reviewed: 01/28/2013 Elsevier Interactive Patient Education [employer].

## 2015-07-12 DIAGNOSIS — Z319 Encounter for procreative management, unspecified: Secondary | ICD-10-CM | POA: Insufficient documentation

## 2015-07-12 LAB — COMPLETE METABOLIC PANEL WITH GFR
ALBUMIN: 3.8 g/dL (ref 3.6–5.1)
ALK PHOS: 99 U/L (ref 33–115)
ALT: 14 U/L (ref 6–29)
AST: 18 U/L (ref 10–30)
BILIRUBIN TOTAL: 0.6 mg/dL (ref 0.2–1.2)
BUN: 11 mg/dL (ref 7–25)
CALCIUM: 9.1 mg/dL (ref 8.6–10.2)
CO2: 29 mmol/L (ref 20–31)
Chloride: 103 mmol/L (ref 98–110)
Creat: 0.7 mg/dL (ref 0.50–1.10)
GFR, Est African American: >89 mL/min (ref >=60)
GFR, Est Non African American: >89 mL/min (ref >=60)
GLUCOSE: 85 mg/dL (ref 65–99)
POTASSIUM: 3.7 mmol/L (ref 3.5–5.3)
SODIUM: 142 mmol/L (ref 135–146)
TOTAL PROTEIN: 6.8 g/dL (ref 6.1–8.1)

## 2015-07-12 LAB — HIV ANTIBODY (ROUTINE TESTING W REFLEX): HIV 1&2 Ab, 4th Generation: NONREACTIVE

## 2015-07-12 LAB — CYTOLOGY - PAP

## 2015-07-12 LAB — RPR

## 2015-07-12 NOTE — Assessment & Plan Note (Signed)
Advised to wait an entire year after stopping depo & trying for pregnancy. If still not pregnant, patient is to call us & we can refer her to a fertility specialist.

## 2015-07-12 NOTE — Assessment & Plan Note (Signed)
Symptoms consistent with yeast. Recent antibiotic exposure. Will treat with diflucan 150mg  x1, repeat in 3 days if not better. Check wet prep & STDs.

## 2015-07-12 NOTE — Assessment & Plan Note (Signed)
Check CMET today. 

## 2015-07-12 NOTE — Assessment & Plan Note (Signed)
Not on medications. Blood pressure 140/88 today. Hold on treating for now since it is so close to goal. Check CMET today.

## 2015-07-14 ENCOUNTER — Telehealth: Payer: Self-pay | Admitting: Family Medicine

## 2015-07-14 NOTE — Telephone Encounter (Signed)
Attempted to reach patient to discuss results. No answer, left voicemail asking her to call us back.  When patient returns call please let her know: -All STD tests were negative. -Pap smear was normal -Wet prep did show evidence of bacterial vaginosis. This is not an STD but may cause vaginal discharge. If she would like this treated she can let us know and we can send in medicine. It's not mandatory for it to be treated. This is different than the yeast I treated her for in the visit. -lipid panel was not drawn for some reason. Please apologize, I'm not sure why this wasn't drawn. Recommend patient return for this for a fasting lab visit if she would like cholesterol checked.  I am happy to speak with her if she has questions. I will be out of town starting tomorrow through Tuesday, returning Wednesday. Dr. Erin Hearing is covering for me during that time.  Leeanne Rio, MD

## 2015-07-14 NOTE — Telephone Encounter (Signed)
Pt called back and was given her results and said that she would like the medication for the BV. She also said that she would prefer this to be on the 4.00 list at her [facility] in [facility]. jw

## 2015-07-19 MED ORDER — METRONIDAZOLE 500 MG PO TABS: 500.0000 mg | ORAL_TABLET | Freq: Two times a day (BID) | ORAL | Status: DC

## 2015-07-19 NOTE — Telephone Encounter (Signed)
Sent in Metronidazole for BV

## 2015-07-24 ENCOUNTER — Telehealth: Payer: Self-pay | Admitting: Family Medicine

## 2015-07-24 NOTE — Telephone Encounter (Signed)
AFTER HOURS LINE   Patient still having burning, itching, and vaginal discharge and asking for medication. She took the 1 tablet that was prescribed to her. She had a Rx sent to her pharmacy for Flagyl, however when I discuss this with her, she seems confused. She never picked up that prescription. Discussed that she pick that Rx up from the [place] at [facility]. Discussed that this should improve her symptoms, however if not, she should follow up with [place].  Discussed disulfiram reaction.   Archie Patten, MD [facility] Family Medicine Resident  07/24/2015, 10:53 AM

## 2015-08-15 ENCOUNTER — Telehealth: Payer: Self-pay | Admitting: *Deleted

## 2015-08-15 NOTE — Telephone Encounter (Signed)
I do not see accu test strips on her medication list. Furthermore, patient has no hx of diabetes. I am not sure why she would need accu test strips sent in.

## 2015-08-15 NOTE — Telephone Encounter (Signed)
Needs accu test strips sent in. Deseree Kennon Holter, CMA

## 2015-11-21 ENCOUNTER — Ambulatory Visit (INDEPENDENT_AMBULATORY_CARE_PROVIDER_SITE_OTHER): Payer: BLUE CROSS/BLUE SHIELD | Admitting: Student

## 2015-11-21 ENCOUNTER — Other Ambulatory Visit (HOSPITAL_COMMUNITY)
Admission: RE | Admit: 2015-11-21 | Discharge: 2015-11-21 | Disposition: A | Discharge status: Home / Self Care | Payer: BLUE CROSS/BLUE SHIELD | Source: Ambulatory Visit | Attending: Family Medicine | Admitting: Family Medicine

## 2015-11-21 VITALS — BP 117/73 | HR 83 | Temp 98.6°F | Ht 62.0 in | Wt 227.0 lb

## 2015-11-21 DIAGNOSIS — Z113 Encounter for screening for infections with a predominantly sexual mode of transmission: Secondary | ICD-10-CM

## 2015-11-21 DIAGNOSIS — N898 Other specified noninflammatory disorders of vagina: Secondary | ICD-10-CM | POA: Diagnosis not present

## 2015-11-21 DIAGNOSIS — B373 Candidiasis of vulva and vagina: Secondary | ICD-10-CM

## 2015-11-21 DIAGNOSIS — A499 Bacterial infection, unspecified: Secondary | ICD-10-CM

## 2015-11-21 DIAGNOSIS — Z7251 High risk heterosexual behavior: Secondary | ICD-10-CM | POA: Diagnosis not present

## 2015-11-21 DIAGNOSIS — N760 Acute vaginitis: Secondary | ICD-10-CM

## 2015-11-21 DIAGNOSIS — B9689 Other specified bacterial agents as the cause of diseases classified elsewhere: Secondary | ICD-10-CM | POA: Insufficient documentation

## 2015-11-21 DIAGNOSIS — B3731 Acute candidiasis of vulva and vagina: Secondary | ICD-10-CM

## 2015-11-21 DIAGNOSIS — R300 Dysuria: Secondary | ICD-10-CM

## 2015-11-21 DIAGNOSIS — Z319 Encounter for procreative management, unspecified: Secondary | ICD-10-CM

## 2015-11-21 LAB — POCT URINE PREGNANCY: PREG TEST UR: NEGATIVE

## 2015-11-21 LAB — POCT URINALYSIS DIPSTICK
Bilirubin, UA: NEGATIVE
GLUCOSE UA: NEGATIVE
Leukocytes, UA: NEGATIVE
Nitrite, UA: NEGATIVE
PROTEIN UA: NEGATIVE
RBC UA: NEGATIVE
SPEC GRAV UA: 1.025
UROBILINOGEN UA: 1
pH, UA: 6

## 2015-11-21 LAB — POCT WET PREP (WET MOUNT)
CLUE CELLS WET PREP WHIFF POC: POSITIVE
TRICHOMONAS WET PREP HPF POC: ABSENT

## 2015-11-21 MED ORDER — FLUCONAZOLE 150 MG PO TABS: ORAL_TABLET | ORAL | 0 refills | Status: DC

## 2015-11-21 MED ORDER — METRONIDAZOLE 500 MG PO TABS: 500.0000 mg | ORAL_TABLET | Freq: Two times a day (BID) | ORAL | 0 refills | Status: DC

## 2015-11-21 MED ORDER — FOLIC ACID 400 MCG PO TABS
400.0000 ug | ORAL_TABLET | Freq: Every day | ORAL | 3 refills | Status: DC
Start: 2015-11-21 — End: 2017-03-10

## 2015-11-21 NOTE — Assessment & Plan Note (Signed)
Wet prep remarkable for clue cells and many bacteria. Metronidazole 500 mg twice a day for 7 days

## 2015-11-21 NOTE — Progress Notes (Addendum)
   Subjective:    Patient ID: Leslie Skinner is a 20 y.o. old female.  HPI #Concern for STI: has three sexual partners over the last 12 months. Occasionally uses condom. Reports dysuria and increased frequency of urination since this morning. Denies hematuria or urge. Reports nausea for one day. Denies emesis. She has lower back pain on and off at baseline and denies acute change. Denies vaginal discharge or bleeding. Reports itching (not new). Denies fever or chills. Denies swelling or skin lesion in the groin areas.   #Concern for preg: not on birth control for about a year. She was on Depo in the past. She didn't have her period while on Depo and was started on OCP. She assumed OCP was to prevent pregnancy so she didn't fill it. She says her last menstrual period was about a month ago but don't remember the date. She has been trying to get pregnant for about a year. Last unprotected sexual intercourse was about a week ago. She did not use condom. She is not taking multivitamin and folic acid.   PMH: reviewed SH: Reports smoking 1-2 cigarettes here and there. Denies drinking  Review of Systems Per HPI Objective:   Vitals:   11/21/15 1611  BP: 117/73  Pulse: 83  Temp: 98.6 F (37 C)  TempSrc: Oral  SpO2: 100%  Weight: 227 lb (103 kg)  Height: 5\' 2"  (1.575 m)    GEN: Obese, no apparent distress. GU: No suprapubic or CVA tenderness. External genitalia within normal limit. Speculum exam is remarkable for thick whitish discharge in vaginal fornix. No vaginal or cervical lesions. No active cervical discharge or bleeding. Wet prep and GC/CT swabs obtained SKIN: No vulvar or perineal skin lesion HEM: Negative for inguinal lymphadenopathy NEURO: alert and oriented appropriately, no gross defecits  PSYCH: appropriate mood and affect     Assessment & Plan:  Bacterial vaginosis Wet prep remarkable for clue cells and many bacteria. Metronidazole 500 mg twice a day for 7 days  Vaginal  candidiasis Wet prep remarkable for yeast. Surprised  that she has vaginal candidiasis and bacterial vaginosis at the same time because the two won't flourish in the same pH. Regardless I decided to treat her with Diflucan 1 tablet.   Unprotected sex She has significant risk for sexually transmitted infection. She is also planning to get pregnant.  -Obtained GC/CT/trichomoniasis, HIV and RPR today  Desire for pregnancy Urine pregnancy negative today. This doesn't exclude most recent pregnancy within the last 10 days. I have discussed this with the patient. I recommended taking folic acid and gave a prescription. I also recommended losing weight.   Clinical UTI: UA is negative.  -I have ordered urine for urine culture

## 2015-11-21 NOTE — Assessment & Plan Note (Signed)
She has significant risk for sexually transmitted infection. She is also planning to get pregnant.  -Obtained GC/CT/trichomoniasis, HIV and RPR today

## 2015-11-21 NOTE — Assessment & Plan Note (Signed)
Wet prep remarkable for yeast. Surprised  that she has vaginal candidiasis and bacterial vaginosis at the same time because the two won't flourish in the same pH. Regardless I decided to treat her with Diflucan 1 tablet.

## 2015-11-21 NOTE — Assessment & Plan Note (Signed)
Urine pregnancy negative today. This doesn't exclude most recent pregnancy within the last 10 days. I have discussed this with the patient. I recommended taking folic acid and gave a prescription. I also recommended losing weight.

## 2015-11-21 NOTE — Patient Instructions (Addendum)
It was great seeing you today! We have addressed the following issues today  1. About your concerns for sexually transmitted infection: We are running some tests. 2. Concern about pregnancy: Urine pregnancy test is negative. This doesn't exclude recent pregnancy less than 10 days. I recommend taking folic acid daily if you planning to get pregnant.  3. Burning with urination: His urinalysis is negative.  Urine culture might take about 2 days to come back 4.   Your wetprep showed bacterial vaginosis and yeast infection. I have sent a prescription for Diflucan and metronidazole to your pharmacy. Please pick these prescriptions and start taking    If we did any lab work today, and the results require attention, either me or my nurse will get in touch with you. If everything is normal, you will get a letter in mail. If you don't hear from [place] in two weeks, please give [place] a call. Otherwise, I look forward to talking with you again at our next visit. If you have any questions or concerns before then, please call the clinic at [phone].  Please bring all your medications to every doctors visit   Sign up for My Chart to have easy access to your labs results, and communication with your Primary care physician.    Please check-out at the front desk before leaving the clinic.   Take Care,

## 2015-11-23 ENCOUNTER — Encounter: Payer: Self-pay | Admitting: Student

## 2015-11-23 LAB — CERVICOVAGINAL ANCILLARY ONLY
CHLAMYDIA, DNA PROBE: NEGATIVE
NEISSERIA GONORRHEA: NEGATIVE

## 2015-11-23 LAB — URINE CULTURE

## 2015-11-24 ENCOUNTER — Encounter: Payer: Self-pay | Admitting: Student

## 2015-12-05 IMAGING — CR DG FINGER THUMB 2+V*R*
1 series · 1 of 1 positions shown · non-contrast
Comparison: None.

CLINICAL DATA: Right thumb burn.

EXAM:
RIGHT THUMB 2+V

[view not recorded]
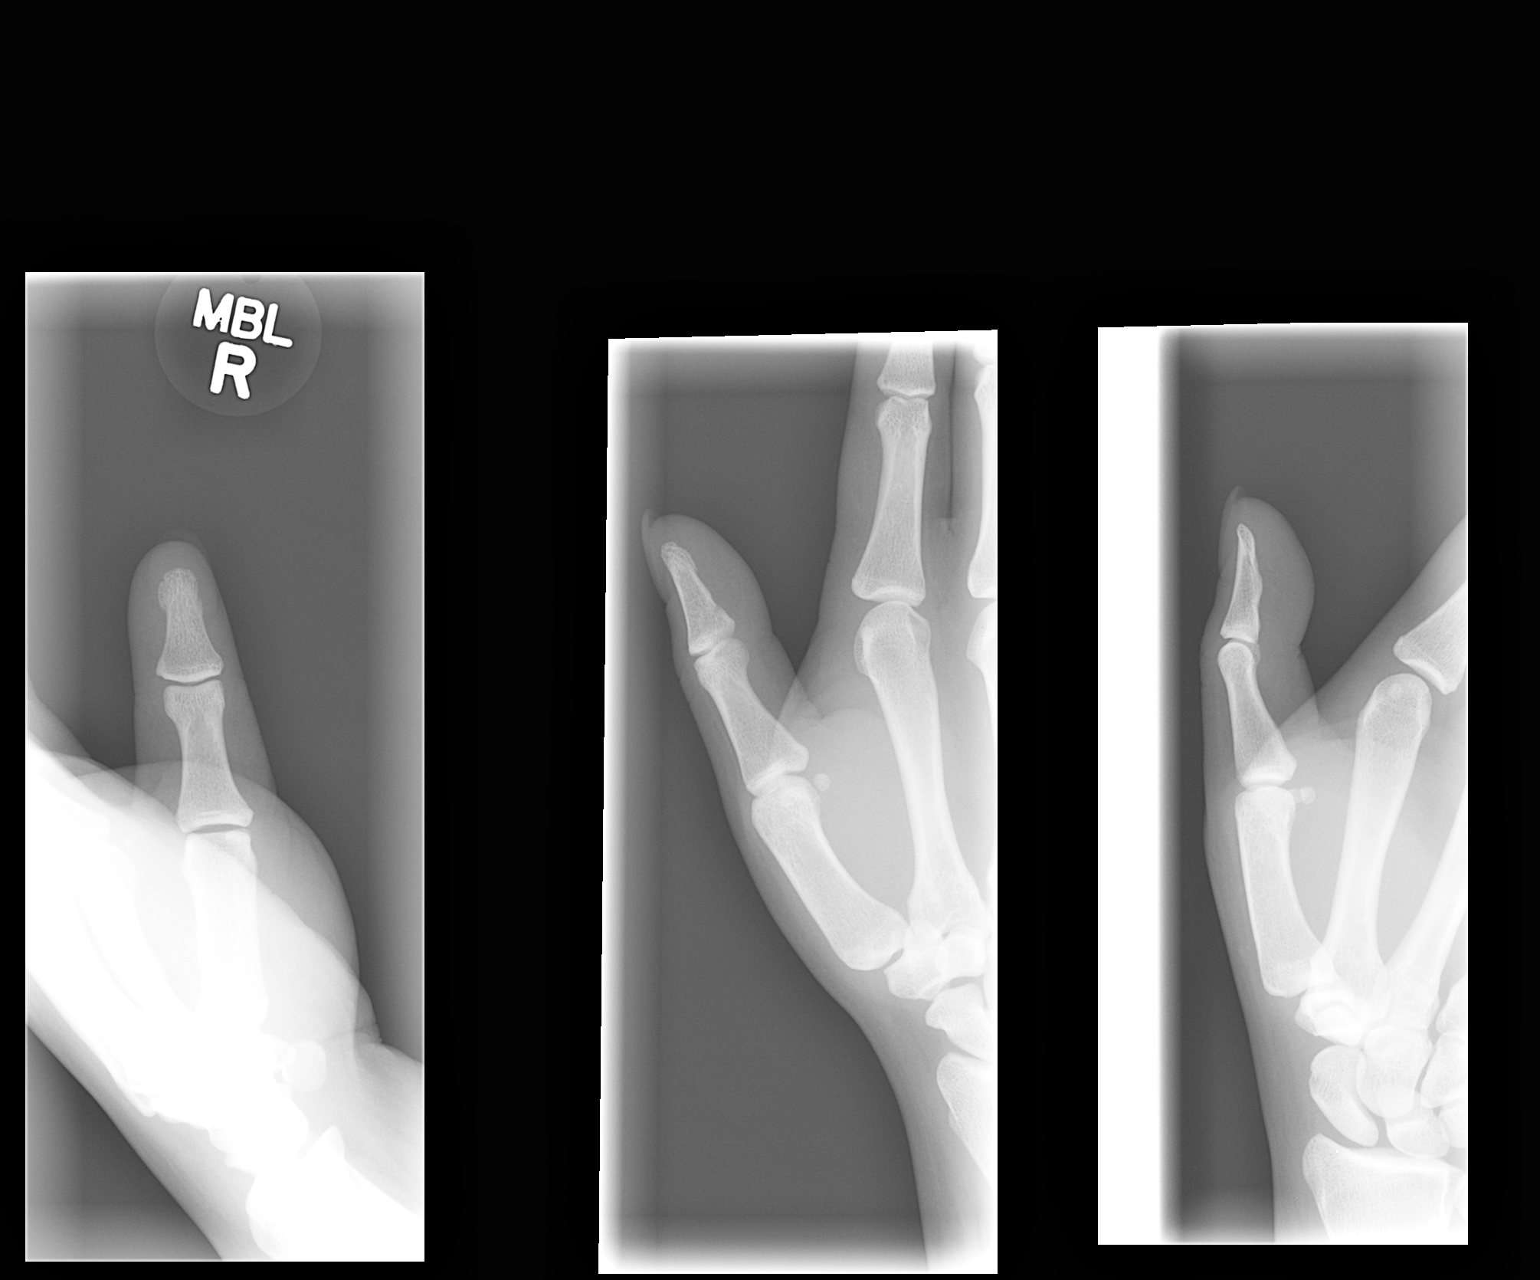

[1 of 1 positions shown; findings below may reference images not displayed]

FINDINGS: There is no evidence of fracture or dislocation. There is no
evidence of arthropathy or other focal bone abnormality. Soft
tissues are unremarkable
IMPRESSION: Negative.

## 2016-04-02 ENCOUNTER — Ambulatory Visit (INDEPENDENT_AMBULATORY_CARE_PROVIDER_SITE_OTHER): Payer: BLUE CROSS/BLUE SHIELD | Admitting: Student in an Organized Health Care Education/Training Program

## 2016-04-02 ENCOUNTER — Encounter: Payer: Self-pay | Admitting: Student in an Organized Health Care Education/Training Program

## 2016-04-02 ENCOUNTER — Other Ambulatory Visit (HOSPITAL_COMMUNITY)
Admission: RE | Admit: 2016-04-02 | Discharge: 2016-04-02 | Disposition: A | Discharge status: Home / Self Care | Payer: BLUE CROSS/BLUE SHIELD | Source: Ambulatory Visit | Attending: Family Medicine | Admitting: Family Medicine

## 2016-04-02 VITALS — BP 128/88 | HR 86 | Temp 98.2°F | Ht 62.0 in | Wt 220.0 lb

## 2016-04-02 DIAGNOSIS — N760 Acute vaginitis: Secondary | ICD-10-CM

## 2016-04-02 DIAGNOSIS — R7303 Prediabetes: Secondary | ICD-10-CM

## 2016-04-02 DIAGNOSIS — Z113 Encounter for screening for infections with a predominantly sexual mode of transmission: Secondary | ICD-10-CM | POA: Insufficient documentation

## 2016-04-02 DIAGNOSIS — B9689 Other specified bacterial agents as the cause of diseases classified elsewhere: Secondary | ICD-10-CM

## 2016-04-02 DIAGNOSIS — N898 Other specified noninflammatory disorders of vagina: Secondary | ICD-10-CM

## 2016-04-02 DIAGNOSIS — Z87898 Personal history of other specified conditions: Secondary | ICD-10-CM | POA: Diagnosis not present

## 2016-04-02 LAB — POCT URINALYSIS DIPSTICK
BILIRUBIN UA: NEGATIVE
GLUCOSE UA: NEGATIVE
KETONES UA: NEGATIVE
Nitrite, UA: NEGATIVE
Protein, UA: 30
SPEC GRAV UA: 1.02
Urobilinogen, UA: 0.2
pH, UA: 6.5

## 2016-04-02 LAB — POCT UA - MICROSCOPIC ONLY: TRICHOMONAS UA: POSITIVE

## 2016-04-02 LAB — POCT WET PREP (WET MOUNT): CLUE CELLS WET PREP WHIFF POC: NEGATIVE

## 2016-04-02 LAB — HIV ANTIBODY (ROUTINE TESTING W REFLEX): HIV 1&2 Ab, 4th Generation: NONREACTIVE

## 2016-04-02 MED ORDER — METRONIDAZOLE 500 MG PO TABS: 500.0000 mg | ORAL_TABLET | Freq: Two times a day (BID) | ORAL | 0 refills | Status: DC

## 2016-04-02 NOTE — Assessment & Plan Note (Signed)
-   500 mg metronidazole BID for 7d - partner should also be treated - advised abstaining from sexual activity until she and her partner have completed treatment

## 2016-04-02 NOTE — Patient Instructions (Addendum)
It was a pleasure seeing you today in our clinic. Today we discussed your vaginal symptoms. Here is the treatment plan we have discussed and agreed upon together:  - One of your tests was positive for a bacteria called trichomonas. - Please take your antibiotics as prescribed for 7 days.  - Please have your partner see his PCP; he should also be treated for Trichomoniasis. You should abstain from sexual activity until both of your treatments are completed. - If your symptoms worsen or fail to improve over the next week, please call or come back in to be re-evaluated. - We will inform you once the results of STI screening tests are available, and when your HbA1c test result is available.  Our clinic's number is [phone]. Please call with questions or concerns about what we discussed today.  Be well, Dr. Burr Medico

## 2016-04-02 NOTE — Assessment & Plan Note (Addendum)
-   Wet prep today c/w BV - Patient desires STI screening: ordered GC/Chlamydia, RPR, HIV and will follow up with these results - Burning with urination seems to be more consistent with itching and abrasions in the vaginal area, and UA negative for UTI today
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
## 2016-04-02 NOTE — Assessment & Plan Note (Addendum)
History of prediabetes, however most recent HbA1c screen in 2016 was controlled 5.2.  Given recent weight gain, A1c was ordered today, however was mistakenly not collected prior to pt leaving the office. - Will call and advise patient to make a PCP visit for numerous outstanding health maintenance items, and PCP can order A1c at that time if its appropriate

## 2016-04-03 LAB — CERVICOVAGINAL ANCILLARY ONLY
CHLAMYDIA, DNA PROBE: NEGATIVE
Neisseria Gonorrhea: NEGATIVE

## 2016-04-03 LAB — RPR

## 2016-04-04 ENCOUNTER — Telehealth: Payer: Self-pay | Admitting: Internal Medicine

## 2016-04-04 NOTE — Telephone Encounter (Signed)
Pt would like lab results. ep

## 2016-04-05 ENCOUNTER — Encounter: Payer: Self-pay | Admitting: Student in an Organized Health Care Education/Training Program

## 2016-04-05 NOTE — Telephone Encounter (Signed)
Called patient to inform her that her STI screening results including HIV, syphilis, gonorrhea and chlamydia were normal.  Attempted calls x3 with busy signal each time.  Will send a letter with this information.

## 2016-05-08 ENCOUNTER — Ambulatory Visit: Payer: BLUE CROSS/BLUE SHIELD | Admitting: Family Medicine

## 2016-06-07 ENCOUNTER — Emergency Department (HOSPITAL_COMMUNITY)
Admission: EM | Admit: 2016-06-07 | Discharge: 2016-06-07 | Disposition: A | Discharge status: Home / Self Care | Payer: BLUE CROSS/BLUE SHIELD | Attending: Emergency Medicine | Admitting: Emergency Medicine

## 2016-06-07 ENCOUNTER — Encounter (HOSPITAL_COMMUNITY): Payer: Self-pay

## 2016-06-07 DIAGNOSIS — R112 Nausea with vomiting, unspecified: Secondary | ICD-10-CM | POA: Insufficient documentation

## 2016-06-07 DIAGNOSIS — Z7722 Contact with and (suspected) exposure to environmental tobacco smoke (acute) (chronic): Secondary | ICD-10-CM | POA: Insufficient documentation

## 2016-06-07 DIAGNOSIS — E119 Type 2 diabetes mellitus without complications: Secondary | ICD-10-CM | POA: Insufficient documentation

## 2016-06-07 DIAGNOSIS — I10 Essential (primary) hypertension: Secondary | ICD-10-CM | POA: Insufficient documentation

## 2016-06-07 DIAGNOSIS — Z7984 Long term (current) use of oral hypoglycemic drugs: Secondary | ICD-10-CM | POA: Insufficient documentation

## 2016-06-07 LAB — URINALYSIS, ROUTINE W REFLEX MICROSCOPIC
Bilirubin Urine: NEGATIVE
Glucose, UA: NEGATIVE mg/dL
Hgb urine dipstick: NEGATIVE
Ketones, ur: NEGATIVE mg/dL
Nitrite: NEGATIVE
Protein, ur: NEGATIVE mg/dL
Specific Gravity, Urine: 1.027 (ref 1.005–1.030)
pH: 6 (ref 5.0–8.0)

## 2016-06-07 LAB — CBG MONITORING, ED: GLUCOSE-CAPILLARY: 117 mg/dL — AB (ref 65–99)

## 2016-06-07 LAB — POC URINE PREG, ED: PREG TEST UR: NEGATIVE

## 2016-06-07 MED ORDER — ONDANSETRON 4 MG PO TBDP
4.0000 mg | ORAL_TABLET | Freq: Once | ORAL | Status: AC
  Administered 2016-06-07: 4 mg via ORAL
  Filled 2016-06-07: qty 1

## 2016-06-07 MED ORDER — ONDANSETRON HCL 4 MG PO TABS: 4.0000 mg | ORAL_TABLET | Freq: Four times a day (QID) | ORAL | 0 refills | Status: DC

## 2016-06-07 NOTE — ED Triage Notes (Signed)
Patient complains of hematuria x 3 days with nausea. Complains of some pressure to lower abdomen. NAD. No fever. Denies dysuria

## 2016-06-07 NOTE — Discharge Instructions (Signed)
Stay on clear liquids tonight and then advance to the diet to help relieve diarrhea. Take the medication for nausea as needed. Follow up with your doctor or return here if symptoms worsen.
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
## 2016-07-18 ENCOUNTER — Encounter (HOSPITAL_COMMUNITY): Payer: Self-pay | Admitting: *Deleted

## 2016-07-18 ENCOUNTER — Emergency Department (HOSPITAL_COMMUNITY)
Admission: EM | Admit: 2016-07-18 | Discharge: 2016-07-19 | Disposition: A | Discharge status: Home / Self Care | Payer: BLUE CROSS/BLUE SHIELD | Attending: Emergency Medicine | Admitting: Emergency Medicine

## 2016-07-18 DIAGNOSIS — R112 Nausea with vomiting, unspecified: Secondary | ICD-10-CM

## 2016-07-18 DIAGNOSIS — I10 Essential (primary) hypertension: Secondary | ICD-10-CM | POA: Insufficient documentation

## 2016-07-18 DIAGNOSIS — Z7722 Contact with and (suspected) exposure to environmental tobacco smoke (acute) (chronic): Secondary | ICD-10-CM | POA: Insufficient documentation

## 2016-07-18 DIAGNOSIS — E119 Type 2 diabetes mellitus without complications: Secondary | ICD-10-CM | POA: Insufficient documentation

## 2016-07-18 DIAGNOSIS — Z7984 Long term (current) use of oral hypoglycemic drugs: Secondary | ICD-10-CM | POA: Insufficient documentation

## 2016-07-18 DIAGNOSIS — Z79899 Other long term (current) drug therapy: Secondary | ICD-10-CM | POA: Insufficient documentation

## 2016-07-18 LAB — COMPREHENSIVE METABOLIC PANEL
ALT: 16 U/L (ref 14–54)
AST: 23 U/L (ref 15–41)
Albumin: 3.8 g/dL (ref 3.5–5.0)
Alkaline Phosphatase: 94 U/L (ref 38–126)
Anion gap: 8 (ref 5–15)
BUN: 12 mg/dL (ref 6–20)
CHLORIDE: 103 mmol/L (ref 101–111)
CO2: 25 mmol/L (ref 22–32)
CREATININE: 0.92 mg/dL (ref 0.44–1.00)
Calcium: 8.9 mg/dL (ref 8.9–10.3)
Glucose, Bld: 106 mg/dL — ABNORMAL HIGH (ref 65–99)
Potassium: 3.6 mmol/L (ref 3.5–5.1)
SODIUM: 136 mmol/L (ref 135–145)
Total Bilirubin: 0.5 mg/dL (ref 0.3–1.2)
Total Protein: 6.9 g/dL (ref 6.5–8.1)

## 2016-07-18 LAB — CBC
HEMATOCRIT: 39.1 % (ref 36.0–46.0)
Hemoglobin: 13.1 g/dL (ref 12.0–15.0)
MCH: 27.3 pg (ref 26.0–34.0)
MCHC: 33.5 g/dL (ref 30.0–36.0)
MCV: 81.6 fL (ref 78.0–100.0)
Platelets: 236 10*3/uL (ref 150–400)
RBC: 4.79 MIL/uL (ref 3.87–5.11)
RDW: 14.2 % (ref 11.5–15.5)
WBC: 11.1 10*3/uL — AB (ref 4.0–10.5)

## 2016-07-18 LAB — URINALYSIS, ROUTINE W REFLEX MICROSCOPIC
BACTERIA UA: NONE SEEN
BILIRUBIN URINE: NEGATIVE
Glucose, UA: NEGATIVE mg/dL
Hgb urine dipstick: NEGATIVE
KETONES UR: NEGATIVE mg/dL
Nitrite: NEGATIVE
Protein, ur: NEGATIVE mg/dL
Specific Gravity, Urine: 1.027 (ref 1.005–1.030)
pH: 5 (ref 5.0–8.0)

## 2016-07-18 LAB — POC URINE PREG, ED: PREG TEST UR: NEGATIVE

## 2016-07-18 MED ORDER — ONDANSETRON 4 MG PO TBDP: 4.0000 mg | ORAL_TABLET | Freq: Three times a day (TID) | ORAL | 0 refills | Status: DC | PRN

## 2016-07-18 MED ORDER — ONDANSETRON HCL 4 MG/2ML IJ SOLN
4.0000 mg | Freq: Once | INTRAMUSCULAR | Status: AC
  Administered 2016-07-18: 4 mg via INTRAVENOUS
  Filled 2016-07-18: qty 2

## 2016-07-18 MED ORDER — SODIUM CHLORIDE 0.9 % IV BOLUS (SEPSIS)
1000.0000 mL | Freq: Once | INTRAVENOUS | Status: AC
  Administered 2016-07-18: 1000 mL via INTRAVENOUS
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
## 2016-07-18 NOTE — ED Triage Notes (Signed)
Pt c/o fever, headache, and NV since Sunday. Pt reports little PO intake due to vomiting. Has not taken any OTC medications

## 2016-07-23 ENCOUNTER — Ambulatory Visit: Payer: BLUE CROSS/BLUE SHIELD | Admitting: Family Medicine

## 2016-07-30 NOTE — Progress Notes (Signed)
    Subjective:  Leslie Skinner is a 21 y.o. female who presents to the [facility] today with multiple complaints including vaginal discharge, leg swelling/numbness, lightheadedness.  HPI:  Vaginal discharge Grayish discharge, with foul odor and itching. Similar to in January when she had BV. No new sexual partners but would like to be tested for STDs today.   Multiple somatic complaints Patient states she has been under a lot of stress since her mother died unexpectedly in 2023-03-04 of heart failure. Over the last month has noticed she get swelling in her legs when she stands for long periods of time, numbness in her feet that is occasionally painful. Also for the past few weeks has occasionally gotten lightheaded at work and felt "woozy" but no syncope/LOC/falls/CP/SOB with those episodes. She does endorse some chest discomfort and palpitations at night when she is worrying about her health and thinking about her mother's death. She would like to be checked for multiple medical problems today citing that she was once told she had liver problems.   ROS: Per HPI  Objective:  Physical Exam: BP 110/70   Pulse 92   Temp 98.3 F (36.8 C) (Oral)   Wt 215 lb (97.5 kg)   LMP 07/28/2016   SpO2 98%   BMI 39.32 kg/m   Gen: NAD, resting comfortably CV: RRR with no murmurs appreciated Pulm: NWOB, CTAB with no crackles, wheezes, or rhonchi GI: Normal bowel sounds present. Soft, Nontender, Nondistended. MSK: no edema, cyanosis, or clubbing noted. Decreased sensation in legs in stocking distribution but 5/5 strength and 2+ reflexes. Skin: warm, dry Neuro: grossly normal except for decreased sensation in LE as per above, moves all extremities Psych: Normal affect and thought content  Results for orders placed or performed in visit on 07/31/16 (from the past 72 hour(s))  HgB A1c     Status: None   Collection Time: 07/31/16 10:09 AM  Result Value Ref Range   Hemoglobin A1C 5.5   POCT Wet Prep [facility])     Status: Abnormal   Collection Time: 07/31/16 10:37 AM  Result Value Ref Range   Source Wet Prep POC VAG    WBC, Wet Prep HPF POC 1-5    Bacteria Wet Prep HPF POC Moderate (A) Few   Clue Cells Wet Prep HPF POC Moderate (A) None   Clue Cells Wet Prep Whiff POC Positive Whiff    Yeast Wet Prep HPF POC None    Trichomonas Wet Prep HPF POC Absent Absent     Assessment/Plan:  Bacterial vaginosis Wet prep showing BV.  - Treat with flagyl 500mg  BID for 7 days. - GC/Chlamydia, HIV, RPR collected today.   Somatic complaints, multiple Suspect patient multiple symptoms are stemming from a combination of grief from her mother's death and anxiety about her health as a result. Reviewed CBC and CMP from earlier this month which are unremarkable and results from CT abd in 2014 that showed no abnormalities. - Warm hand off to [facility] today, please see their note for full recommendations. Per [facility], patient seen at [facility] in the past and patient would benefit from seeing a psychiatrist. - Since already checking bloodwork for RPR and HIV, will also check vitamin b12 for completeness.  Follow up with PCP in 4 weeks for her mood.  Bufford Lope, DO PGY-1, [facility] 07/31/2016 10:16 AM

## 2016-07-31 ENCOUNTER — Other Ambulatory Visit (HOSPITAL_COMMUNITY)
Admission: RE | Admit: 2016-07-31 | Discharge: 2016-07-31 | Disposition: A | Discharge status: Home / Self Care | Payer: Self-pay | Source: Ambulatory Visit | Attending: Family Medicine | Admitting: Family Medicine

## 2016-07-31 ENCOUNTER — Ambulatory Visit (INDEPENDENT_AMBULATORY_CARE_PROVIDER_SITE_OTHER): Payer: Self-pay | Admitting: Family Medicine

## 2016-07-31 ENCOUNTER — Encounter: Payer: Self-pay | Admitting: Family Medicine

## 2016-07-31 VITALS — BP 110/70 | HR 92 | Temp 98.3°F | Wt 215.0 lb

## 2016-07-31 DIAGNOSIS — R6889 Other general symptoms and signs: Secondary | ICD-10-CM

## 2016-07-31 DIAGNOSIS — N898 Other specified noninflammatory disorders of vagina: Secondary | ICD-10-CM

## 2016-07-31 DIAGNOSIS — Z113 Encounter for screening for infections with a predominantly sexual mode of transmission: Secondary | ICD-10-CM

## 2016-07-31 DIAGNOSIS — N760 Acute vaginitis: Secondary | ICD-10-CM

## 2016-07-31 DIAGNOSIS — R7309 Other abnormal glucose: Secondary | ICD-10-CM

## 2016-07-31 DIAGNOSIS — G629 Polyneuropathy, unspecified: Secondary | ICD-10-CM

## 2016-07-31 DIAGNOSIS — B9689 Other specified bacterial agents as the cause of diseases classified elsewhere: Secondary | ICD-10-CM

## 2016-07-31 LAB — POCT WET PREP (WET MOUNT)
CLUE CELLS WET PREP WHIFF POC: POSITIVE
Trichomonas Wet Prep HPF POC: ABSENT

## 2016-07-31 LAB — POCT GLYCOSYLATED HEMOGLOBIN (HGB A1C): Hemoglobin A1C: 5.5

## 2016-07-31 MED ORDER — METRONIDAZOLE 500 MG PO TABS: 500.0000 mg | ORAL_TABLET | Freq: Two times a day (BID) | ORAL | 0 refills | Status: AC

## 2016-07-31 NOTE — Assessment & Plan Note (Addendum)
Wet prep showing BV.  - Treat with flagyl 500mg  BID for 7 days. - GC/Chlamydia, HIV, RPR collected today.

## 2016-07-31 NOTE — Assessment & Plan Note (Signed)
Suspect patient multiple symptoms are stemming from a combination of grief from her mother's death and anxiety about her health as a result. Reviewed CBC and CMP from earlier this month which are unremarkable and results from CT abd in 2014 that showed no abnormalities. - Warm hand off to [facility] today, please see their note for full recommendations. Per [facility], patient seen at [facility] in the past and patient would benefit from seeing a psychiatrist. - Since already checking bloodwork for RPR and HIV, will also check vitamin b12 for completeness.

## 2016-07-31 NOTE — Patient Instructions (Addendum)
I'm sorry for your loss, and thank you for meeting with out [facility] today. - Please follow up with your PCP regarding the other things we talked about today in about 2-4 weeks.  For your bacterial vaginosis, - Take metronizadole twice a day for 7 days   We are checking some labs today, and someone will call you or send you a letter with the results when they are available.   Take care and seek immediate care sooner if you develop any concerns.   Dr. Bufford Lope, [place]

## 2016-07-31 NOTE — Progress Notes (Signed)
Dr. Shawna Orleans requested a [place].   Presenting Issue:  Grief, anxiety, low mood, anger/irritability, nightmares  Report of symptoms:  Patient reported experiencing several types of problematic symptoms. She reports feeling generally overwhelmed and stressed and is interested in medication to help.  Grief: Related to her grief after losing her mother in November, patient reports that she still has frequent nightmares in which she relives her mother passing out at the house before later passing away. These nightmares are very distressing and interfere with her sleep. Patient also cries frequently when thinking about her mother, who was a huge support for her and the most important person in her life. She also sometimes thinks about what she could have done differently to help her mom and feels guilty. Patient is able to go to work and function okay, but does struggle with memories of her mom coming to see her during her breaks at work. Patient has even considered changing jobs to avoid thinking about her mom. When she returns home from work she finds it extremely difficult to not think about her mom. Since losing her mom, patient also feels on edge and often feels nervous being in the house alone (sister/niece also stay with her). Patient sometimes feels she sees someone standing just outside her line of vision but when she turns to look, nothing is there. When this happens she becomes so afraid that she goes to sit in her car outside the house until her sister gets home.  Mood: Patient reports anger that has been present since she was a teenager. This anger is confusing to her because sometimes she doesn't understand why she gets so mad. It has been interfering with her job and her supervisor has talked with her several times about needing to get this under control. Patient also reports that she has times when she is very excited and can't figure out why or what she would be excited about. She  gave example of waking up very excited with that excitement turning to intense irritability later in the day. Patient reports that most of the time, she feels sad and down because she is thinking about her mom. She feels like she can't do anything anymore because her mom isn't there to support her or do things with her.   Duration of CURRENT symptoms:  Patient states anger issues have been present since teenage years but nightmares, low mood and feeling on-edge started after her mother passed.   Impact on function:  Patient has felt that symptoms negatively impact her relationships and work performance in addition to her own wellbeing. She recently started going to the gym and is sometimes able to get out of the house, but often stays in or prefers to be alone because of her low mood.   Psychiatric History - Diagnoses: diagnosed with bipolar disorder at [facility] when 14 or 15, was prescribed med but did not take due to her and her mother feeling nervous about medication. Did not get any other treatment - Hospitalizations: not assessed - Pharmacotherapy: none - Outpatient therapy: none  Family history of psychiatric issues:  Patient reports that anxiety and depression "run in the family," named mother, aunt, cousin specifically.   Current and history of substance use:  Not assessed  PHQ-9:  21 (item 9=0) GAD-7:  14 MDQ (if indicated):  10   Assessment / Plan / Recommendations: Patient's diagnostic picture is not yet clear. She has a past diagnosis of bipolar disorder from one visit to  [facility] when she was 75 or 73, but has not had any other experiences with mental health providers that she can recall. She reported having times when she is especially angry/irritable or excited without an obvious reason. Her anger/irritability is sometimes impairing for her. Patient is struggling with grief after loss of her mother, but also some symptoms indicating that this loss was traumatic, with bothersome  nightmares about her mother's passing and generally feeling on-edge and unsafe when alone. She experiences somatic symptoms as well that she feels are triggered by anxiety/stress, and which are most problematic when she is thinking about her mom. This patient appears to have had some underlying mental health issue (potentially bipolar disorder per [facility], though this should be reassessed as that is patient's retrospective report on an appointment when she was a teenager), with her mother's death as an added stressor that was particularly difficult given how close she and her mom were. Discussed this patient with Dr. Shawna Orleans and together we decided that having her go to [facility] is the best course of action right now so that she can receive additional assessment as well as medication management through [facility]. Patient agreed with this plan and states she can to go [facility] next Monday or Tuesday. We discussed options for counseling but she feels that right now she would like to see how things go at [facility]. She agreed to learn deep breathing relaxation in clinic today and feels it will be beneficial. She agreed to a followup phone call in a few weeks from [facility].   Warmhandoff:    Warm Hand Off Completed.

## 2016-08-01 ENCOUNTER — Encounter: Payer: Self-pay | Admitting: Family Medicine

## 2016-08-01 LAB — HIV ANTIBODY (ROUTINE TESTING W REFLEX): HIV Screen 4th Generation wRfx: NONREACTIVE

## 2016-08-01 LAB — CERVICOVAGINAL ANCILLARY ONLY
CHLAMYDIA, DNA PROBE: NEGATIVE
Neisseria Gonorrhea: NEGATIVE

## 2016-08-01 LAB — VITAMIN B12: Vitamin B-12: 534 pg/mL (ref 232–1245)

## 2016-08-01 LAB — RPR: RPR: NONREACTIVE

## 2016-08-10 ENCOUNTER — Telehealth: Payer: Self-pay

## 2016-08-10 NOTE — Telephone Encounter (Signed)
Pt would like a call with test results, please call pt on [phone]. Ottis Stain, CMA

## 2016-08-13 MED ORDER — METRONIDAZOLE 500 MG PO TABS: 500.0000 mg | ORAL_TABLET | Freq: Two times a day (BID) | ORAL | 0 refills | Status: DC

## 2016-08-13 NOTE — Telephone Encounter (Signed)
Please call patient and let her know her results are consistent with bacterial vaginosis which can usually be treated with 7 days of antibiotics. This antibiotic is available for her to pick up at the pharmacy is she needs it.

## 2016-08-13 NOTE — Addendum Note (Signed)
Addended by: Kerrin Mo Z on: 08/13/2016 01:31 PM   Modules accepted: Orders

## 2016-08-14 NOTE — Telephone Encounter (Signed)
Spoke with patient and provided her with results, positive for BV and antibiotic ready for pick up from pharmacy, to call with any concerns. She states she understands all.

## 2016-08-21 ENCOUNTER — Telehealth: Payer: Self-pay

## 2016-08-21 NOTE — Telephone Encounter (Signed)
[facility] called to check in with patient. Patient states that she went to [facility]'s walk in clinic and was told she could be seen that day but waited all day until 2pm and had to leave. This was very discouraging for her and she has not been back since. I offered to give information for [facility] of the [facility] walk-in clinic (patient reports she does not currently have insurance coverage) or to give patient information about [facility]. She opted to call to make an appointment with the [facility]. Patient agreed to receive another followup call from me in 2 weeks to check in.

## 2016-08-27 ENCOUNTER — Ambulatory Visit: Payer: Self-pay | Admitting: Internal Medicine

## 2016-09-25 ENCOUNTER — Emergency Department (HOSPITAL_COMMUNITY): Payer: Self-pay

## 2016-09-25 ENCOUNTER — Encounter (HOSPITAL_COMMUNITY): Payer: Self-pay

## 2016-09-25 ENCOUNTER — Emergency Department (HOSPITAL_COMMUNITY)
Admission: EM | Admit: 2016-09-25 | Discharge: 2016-09-25 | Disposition: A | Discharge status: Home / Self Care | Payer: Self-pay | Attending: Emergency Medicine | Admitting: Emergency Medicine

## 2016-09-25 DIAGNOSIS — R109 Unspecified abdominal pain: Secondary | ICD-10-CM

## 2016-09-25 DIAGNOSIS — R102 Pelvic and perineal pain: Secondary | ICD-10-CM | POA: Insufficient documentation

## 2016-09-25 DIAGNOSIS — E119 Type 2 diabetes mellitus without complications: Secondary | ICD-10-CM | POA: Insufficient documentation

## 2016-09-25 DIAGNOSIS — N939 Abnormal uterine and vaginal bleeding, unspecified: Secondary | ICD-10-CM | POA: Insufficient documentation

## 2016-09-25 DIAGNOSIS — Z79899 Other long term (current) drug therapy: Secondary | ICD-10-CM | POA: Insufficient documentation

## 2016-09-25 DIAGNOSIS — Z7722 Contact with and (suspected) exposure to environmental tobacco smoke (acute) (chronic): Secondary | ICD-10-CM | POA: Insufficient documentation

## 2016-09-25 DIAGNOSIS — I10 Essential (primary) hypertension: Secondary | ICD-10-CM | POA: Insufficient documentation

## 2016-09-25 LAB — COMPREHENSIVE METABOLIC PANEL
ALK PHOS: 96 U/L (ref 38–126)
ALT: 15 U/L (ref 14–54)
ANION GAP: 7 (ref 5–15)
AST: 21 U/L (ref 15–41)
Albumin: 4.1 g/dL (ref 3.5–5.0)
BILIRUBIN TOTAL: 0.8 mg/dL (ref 0.3–1.2)
BUN: 9 mg/dL (ref 6–20)
CALCIUM: 9.4 mg/dL (ref 8.9–10.3)
CO2: 26 mmol/L (ref 22–32)
Chloride: 106 mmol/L (ref 101–111)
Creatinine, Ser: 0.9 mg/dL (ref 0.44–1.00)
GFR calc non Af Amer: >60 mL/min (ref >60)
Glucose, Bld: 96 mg/dL (ref 65–99)
Potassium: 3.7 mmol/L (ref 3.5–5.1)
SODIUM: 139 mmol/L (ref 135–145)
TOTAL PROTEIN: 7.8 g/dL (ref 6.5–8.1)

## 2016-09-25 LAB — CBC
HCT: 40.6 % (ref 36.0–46.0)
HEMOGLOBIN: 13.8 g/dL (ref 12.0–15.0)
MCH: 27.7 pg (ref 26.0–34.0)
MCHC: 34 g/dL (ref 30.0–36.0)
MCV: 81.5 fL (ref 78.0–100.0)
PLATELETS: 256 10*3/uL (ref 150–400)
RBC: 4.98 MIL/uL (ref 3.87–5.11)
RDW: 14.5 % (ref 11.5–15.5)
WBC: 7.8 10*3/uL (ref 4.0–10.5)

## 2016-09-25 LAB — WET PREP, GENITAL
Clue Cells Wet Prep HPF POC: NONE SEEN
Sperm: NONE SEEN
Trich, Wet Prep: NONE SEEN
Yeast Wet Prep HPF POC: NONE SEEN

## 2016-09-25 LAB — LIPASE, BLOOD: Lipase: 28 U/L (ref 11–51)

## 2016-09-25 LAB — I-STAT BETA HCG BLOOD, ED (MC, WL, AP ONLY): I-stat hCG, quantitative: <5 m[IU]/mL (ref <5)

## 2016-09-25 LAB — URINALYSIS, ROUTINE W REFLEX MICROSCOPIC
BILIRUBIN URINE: NEGATIVE
GLUCOSE, UA: NEGATIVE mg/dL
KETONES UR: NEGATIVE mg/dL
Leukocytes, UA: NEGATIVE
Nitrite: NEGATIVE
PH: 6 (ref 5.0–8.0)
Protein, ur: NEGATIVE mg/dL
Specific Gravity, Urine: 1.025 (ref 1.005–1.030)

## 2016-09-25 LAB — URINALYSIS, MICROSCOPIC (REFLEX)

## 2016-09-25 MED ORDER — ONDANSETRON HCL 4 MG PO TABS: 4.0000 mg | ORAL_TABLET | Freq: Four times a day (QID) | ORAL | 0 refills | Status: DC

## 2016-09-25 MED ORDER — FAMOTIDINE 20 MG PO TABS: 20.0000 mg | ORAL_TABLET | Freq: Two times a day (BID) | ORAL | 0 refills | Status: DC

## 2016-09-25 MED ORDER — KETOROLAC TROMETHAMINE 30 MG/ML IJ SOLN
30.0000 mg | Freq: Once | INTRAMUSCULAR | Status: AC
  Administered 2016-09-25: 30 mg via INTRAVENOUS
  Filled 2016-09-25: qty 1

## 2016-09-25 MED ORDER — IBUPROFEN 800 MG PO TABS: 800.0000 mg | ORAL_TABLET | Freq: Three times a day (TID) | ORAL | 0 refills | Status: DC

## 2016-09-25 MED ORDER — FAMOTIDINE IN NACL 20-0.9 MG/50ML-% IV SOLN
20.0000 mg | Freq: Once | INTRAVENOUS | Status: AC
  Administered 2016-09-25: 20 mg via INTRAVENOUS
  Filled 2016-09-25: qty 50

## 2016-09-25 MED ORDER — ONDANSETRON HCL 4 MG/2ML IJ SOLN
4.0000 mg | Freq: Once | INTRAMUSCULAR | Status: AC
  Administered 2016-09-25: 4 mg via INTRAVENOUS
  Filled 2016-09-25: qty 2

## 2016-09-25 MED ORDER — ACETAMINOPHEN 500 MG PO TABS: 500.0000 mg | ORAL_TABLET | Freq: Four times a day (QID) | ORAL | 0 refills | Status: DC | PRN

## 2016-09-25 NOTE — ED Notes (Signed)
edp at bedside to do pelvic with hannie emt

## 2016-09-25 NOTE — Discharge Instructions (Signed)
Medications: Pepcid, Zofran, ibuprofen, Tylenol  Treatment: Take Pepcid twice daily for probable acid reflux. Take Zofran every 6 hours as needed for nausea or vomiting. Take ibuprofen 3 times daily as needed for ear pain. You can alternate with Tylenol every 6 hours.   Follow-up: Please follow-up with your OB/GYN as soon as possible for further evaluation and treatment of your abnormal vaginal bleeding. Please return to the emergency department if you develop any new or worsening symptoms.
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
## 2016-09-25 NOTE — ED Notes (Signed)
Patient transported to Ultrasound 

## 2016-09-25 NOTE — ED Triage Notes (Signed)
Per Pt, Pt reports dull abdominal pain that started yesterday with some dark red vaginal bleeding that has started to subside. Pt reports nausea, but denies vomiting. Reports period one week ago that completed.

## 2016-09-26 LAB — HIV ANTIBODY (ROUTINE TESTING W REFLEX): HIV Screen 4th Generation wRfx: NONREACTIVE

## 2016-09-26 LAB — RPR: RPR Ser Ql: NONREACTIVE

## 2016-09-26 LAB — GC/CHLAMYDIA PROBE AMP (~~LOC~~) NOT AT ARMC
Chlamydia: NEGATIVE
Neisseria Gonorrhea: NEGATIVE

## 2016-11-30 ENCOUNTER — Encounter (HOSPITAL_COMMUNITY): Payer: Self-pay | Admitting: Emergency Medicine

## 2016-11-30 ENCOUNTER — Emergency Department (HOSPITAL_COMMUNITY): Payer: Self-pay

## 2016-11-30 ENCOUNTER — Emergency Department (HOSPITAL_COMMUNITY)
Admission: EM | Admit: 2016-11-30 | Discharge: 2016-11-30 | Disposition: A | Discharge status: Home / Self Care | Payer: Self-pay | Attending: Emergency Medicine | Admitting: Emergency Medicine

## 2016-11-30 DIAGNOSIS — Z79899 Other long term (current) drug therapy: Secondary | ICD-10-CM | POA: Insufficient documentation

## 2016-11-30 DIAGNOSIS — E119 Type 2 diabetes mellitus without complications: Secondary | ICD-10-CM | POA: Insufficient documentation

## 2016-11-30 DIAGNOSIS — Z7722 Contact with and (suspected) exposure to environmental tobacco smoke (acute) (chronic): Secondary | ICD-10-CM | POA: Insufficient documentation

## 2016-11-30 DIAGNOSIS — W101XXA Fall (on)(from) sidewalk curb, initial encounter: Secondary | ICD-10-CM | POA: Insufficient documentation

## 2016-11-30 DIAGNOSIS — Y999 Unspecified external cause status: Secondary | ICD-10-CM | POA: Insufficient documentation

## 2016-11-30 DIAGNOSIS — Y9389 Activity, other specified: Secondary | ICD-10-CM | POA: Insufficient documentation

## 2016-11-30 DIAGNOSIS — I10 Essential (primary) hypertension: Secondary | ICD-10-CM | POA: Insufficient documentation

## 2016-11-30 DIAGNOSIS — Z7982 Long term (current) use of aspirin: Secondary | ICD-10-CM | POA: Insufficient documentation

## 2016-11-30 DIAGNOSIS — Z791 Long term (current) use of non-steroidal anti-inflammatories (NSAID): Secondary | ICD-10-CM | POA: Insufficient documentation

## 2016-11-30 DIAGNOSIS — Y929 Unspecified place or not applicable: Secondary | ICD-10-CM | POA: Insufficient documentation

## 2016-11-30 DIAGNOSIS — S93402A Sprain of unspecified ligament of left ankle, initial encounter: Secondary | ICD-10-CM | POA: Insufficient documentation

## 2016-11-30 MED ORDER — IBUPROFEN 800 MG PO TABS: 800.0000 mg | ORAL_TABLET | Freq: Three times a day (TID) | ORAL | 0 refills | Status: DC

## 2016-11-30 MED ORDER — TRAMADOL HCL 50 MG PO TABS: 50.0000 mg | ORAL_TABLET | Freq: Three times a day (TID) | ORAL | 0 refills | Status: DC | PRN
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
## 2016-11-30 NOTE — ED Triage Notes (Signed)
C/o L ankle pain and swelling since stepping off a curb 2 days ago and hearing a pop.  Taking ibuprofen for pain.

## 2016-12-10 ENCOUNTER — Encounter: Payer: Self-pay | Admitting: Internal Medicine

## 2017-01-09 ENCOUNTER — Other Ambulatory Visit (HOSPITAL_COMMUNITY)
Admission: RE | Admit: 2017-01-09 | Discharge: 2017-01-09 | Disposition: A | Discharge status: Home / Self Care | Payer: Medicaid Other | Source: Ambulatory Visit | Attending: Family Medicine | Admitting: Family Medicine

## 2017-01-09 ENCOUNTER — Encounter: Payer: Self-pay | Admitting: Internal Medicine

## 2017-01-09 ENCOUNTER — Ambulatory Visit (INDEPENDENT_AMBULATORY_CARE_PROVIDER_SITE_OTHER): Payer: Self-pay | Admitting: Internal Medicine

## 2017-01-09 VITALS — BP 112/70 | HR 89 | Temp 98.7°F | Ht 62.0 in | Wt 217.0 lb

## 2017-01-09 DIAGNOSIS — Z7251 High risk heterosexual behavior: Secondary | ICD-10-CM | POA: Insufficient documentation

## 2017-01-09 DIAGNOSIS — R358 Other polyuria: Secondary | ICD-10-CM

## 2017-01-09 DIAGNOSIS — N898 Other specified noninflammatory disorders of vagina: Secondary | ICD-10-CM

## 2017-01-09 DIAGNOSIS — R3589 Other polyuria: Secondary | ICD-10-CM

## 2017-01-09 LAB — POCT WET PREP (WET MOUNT)
Clue Cells Wet Prep Whiff POC: POSITIVE
TRICHOMONAS WET PREP HPF POC: ABSENT

## 2017-01-09 LAB — POCT URINALYSIS DIP (MANUAL ENTRY)
BILIRUBIN UA: NEGATIVE mg/dL
Bilirubin, UA: NEGATIVE
Glucose, UA: NEGATIVE mg/dL
LEUKOCYTES UA: NEGATIVE
NITRITE UA: NEGATIVE
PH UA: 6 (ref 5.0–8.0)
RBC UA: NEGATIVE
Spec Grav, UA: >=1.03 — AB (ref 1.010–1.025)
Urobilinogen, UA: 0.2 E.U./dL

## 2017-01-09 LAB — POCT URINE PREGNANCY: PREG TEST UR: NEGATIVE

## 2017-01-09 LAB — GLUCOSE, POCT (MANUAL RESULT ENTRY): POC Glucose: 84 mg/dl (ref 70–99)

## 2017-01-09 MED ORDER — METRONIDAZOLE 500 MG PO TABS: 500.0000 mg | ORAL_TABLET | Freq: Two times a day (BID) | ORAL | 0 refills | Status: DC

## 2017-01-09 NOTE — Patient Instructions (Signed)
Your initial workup for your urine looks fine.  I am going to treat you for bacterial vaginosis.  I want you to follow-up to further discuss contraception.

## 2017-01-09 NOTE — Assessment & Plan Note (Addendum)
Vaginal discharge and smell consistent with bacterial vaginosis. Patient with a new sexual partner, she would like to be tested for all STDs including herpes; thought patient denies any hx of lesions.  - Cervicovaginal ancillary only - POCT Wet Prep (Wet Mount) - metroNIDAZOLE (FLAGYL) 500 MG tablet; Take 1 tablet (500 mg total) by mouth 2 (two) times daily.  Dispense: 14 tablet; Refill: 0 - HIV antibody - RPR - HSV(herpes simplex vrs) 1+2 ab-IgM - HSV(herpes simplex vrs) 1+2 ab-IgG

## 2017-01-09 NOTE — Progress Notes (Signed)
   [facility] Kerrin Mo, MD Phone: [phone]  Reason For Visit: Vaginal Discharge and increased urinary frequency   #Has had vaginal discharge past month.  She states that it is grayish in color.  She feels like it is not her normal discharge.  Does indicate some pain with sexual activity.  She does indicate increased frequency and cloudy urine.  She states that this is been going on for about 2 weeks as well.  She denies any pelvic pain.  She denies any back pain.  She indicates that her sister has had herpes and is worried that she may have herpes.  She denies any genital sores or ulcers.  She is not currently on any contraception.  She was on Depo-Provera 2 years ago.  She has since stopped all of this.  She does not want to become pregnant.  She has been having regular menstrual periods.  No fevers, no nausea, no vomiting, no abdominal or pelvic pain, no back pain  Past Medical History Reviewed problem list.  Medications- reviewed and updated No additions to family history Social history- patient is a non -smoker  Objective: BP 112/70   Pulse 89   Temp 98.7 F (37.1 C) (Oral)   Ht 5\' 2"  (1.575 m)   Wt 217 lb (98.4 kg)   LMP 12/25/2016   SpO2 98%   BMI 39.69 kg/m  Gen: NAD, alert, cooperative with exam GI: soft, non-tender, non-distended, bowel sounds present, no hepatomegaly, no splenomegaly GU: external vaginal tissue wnl, cervix wnl, no punctate lesions on cervix appreciated, white discharge from cervical os, no bleeding, no cervical  motion tenderness, no abdominal/ adnexal masses  Assessment/Plan: See problem based a/p  Vaginal discharge Vaginal discharge and smell consistent with bacterial vaginosis. Patient with a new sexual partner, she would like to be tested for all STDs including herpes; thought patient denies any hx of lesions.  - Cervicovaginal ancillary only - POCT Wet Prep (Wet Mount) - metroNIDAZOLE (FLAGYL) 500 MG tablet; Take 1  tablet (500 mg total) by mouth 2 (two) times daily.  Dispense: 14 tablet; Refill: 0 - HIV antibody - RPR - HSV(herpes simplex vrs) 1+2 ab-IgM - HSV(herpes simplex vrs) 1+2 ab-IgG    Polyuria Patient also noted to have some increased frequency of urination - UA not concerning for infection. Will check urine culture. Possible increased frequency due to fluid intake. Will check metabolic derangements with POCT glucose.  Will obtain urine culture to rule out infection  - POCT urinalysis dipstick - POCT glucose (manual entry) - Urine Culture - POCT Urine Pregnancy (CPT 81025)

## 2017-01-10 DIAGNOSIS — R3589 Other polyuria: Secondary | ICD-10-CM | POA: Insufficient documentation

## 2017-01-10 DIAGNOSIS — R358 Other polyuria: Secondary | ICD-10-CM | POA: Insufficient documentation

## 2017-01-10 LAB — CERVICOVAGINAL ANCILLARY ONLY
Chlamydia: NEGATIVE
NEISSERIA GONORRHEA: NEGATIVE

## 2017-01-10 NOTE — Assessment & Plan Note (Signed)
Patient also noted to have some increased frequency of urination - UA not concerning for infection. Will check urine culture. Possible increased frequency due to fluid intake. Will check metabolic derangements with POCT glucose.  Will obtain urine culture to rule out infection  - POCT urinalysis dipstick - POCT glucose (manual entry) - Urine Culture - POCT Urine Pregnancy (CPT 81025)

## 2017-01-11 LAB — HSV(HERPES SIMPLEX VRS) I + II AB-IGG

## 2017-01-11 LAB — URINE CULTURE

## 2017-01-11 LAB — HIV ANTIBODY (ROUTINE TESTING W REFLEX): HIV SCREEN 4TH GENERATION: NONREACTIVE

## 2017-01-11 LAB — HSV(HERPES SIMPLEX VRS) I + II AB-IGM: HSVI/II Comb IgM: <0.91 Ratio (ref 0.00–0.90)

## 2017-01-11 LAB — RPR: RPR Ser Ql: NONREACTIVE

## 2017-01-15 ENCOUNTER — Telehealth: Payer: Self-pay | Admitting: Internal Medicine

## 2017-01-15 ENCOUNTER — Encounter: Payer: Self-pay | Admitting: Internal Medicine

## 2017-01-15 DIAGNOSIS — R3589 Other polyuria: Secondary | ICD-10-CM

## 2017-01-15 DIAGNOSIS — R358 Other polyuria: Secondary | ICD-10-CM

## 2017-01-15 MED ORDER — CEPHALEXIN 500 MG PO CAPS: 500.0000 mg | ORAL_CAPSULE | Freq: Two times a day (BID) | ORAL | 0 refills | Status: DC

## 2017-01-15 NOTE — Telephone Encounter (Signed)
Tried to call patient to let her know that she has a UTI. However phone was not in service, though discussed calling her about her results on Thursday and she said that would be fine. Will send a letter letting her know and send a prescription for Keflex to her pharmacy

## 2017-03-10 ENCOUNTER — Emergency Department (HOSPITAL_COMMUNITY)
Admission: EM | Admit: 2017-03-10 | Discharge: 2017-03-10 | Disposition: A | Discharge status: Home / Self Care | Payer: Medicaid Other

## 2017-03-10 ENCOUNTER — Encounter (HOSPITAL_COMMUNITY): Payer: Self-pay | Admitting: *Deleted

## 2017-03-10 ENCOUNTER — Other Ambulatory Visit: Payer: Self-pay

## 2017-03-10 ENCOUNTER — Inpatient Hospital Stay (HOSPITAL_COMMUNITY)
Admission: AD | Admit: 2017-03-10 | Discharge: 2017-03-10 | Disposition: A | Discharge status: Home / Self Care | Payer: Medicaid Other | Source: Ambulatory Visit | Attending: Obstetrics & Gynecology | Admitting: Obstetrics & Gynecology

## 2017-03-10 DIAGNOSIS — Z3A01 Less than 8 weeks gestation of pregnancy: Secondary | ICD-10-CM | POA: Insufficient documentation

## 2017-03-10 DIAGNOSIS — O210 Mild hyperemesis gravidarum: Secondary | ICD-10-CM | POA: Insufficient documentation

## 2017-03-10 DIAGNOSIS — Z3201 Encounter for pregnancy test, result positive: Secondary | ICD-10-CM

## 2017-03-10 DIAGNOSIS — N912 Amenorrhea, unspecified: Secondary | ICD-10-CM | POA: Diagnosis present

## 2017-03-10 LAB — URINALYSIS, ROUTINE W REFLEX MICROSCOPIC
BILIRUBIN URINE: NEGATIVE
GLUCOSE, UA: NEGATIVE mg/dL
Hgb urine dipstick: NEGATIVE
Ketones, ur: NEGATIVE mg/dL
Leukocytes, UA: NEGATIVE
NITRITE: NEGATIVE
PH: 6 (ref 5.0–8.0)
Protein, ur: NEGATIVE mg/dL
SPECIFIC GRAVITY, URINE: 1.02 (ref 1.005–1.030)

## 2017-03-10 LAB — POCT PREGNANCY, URINE: Preg Test, Ur: POSITIVE — AB

## 2017-03-10 MED ORDER — PRENATAL ADULT GUMMY/DHA/FA 0.4-25 MG PO CHEW: 1.0000 | CHEWABLE_TABLET | Freq: Every day | ORAL | 12 refills | Status: DC

## 2017-03-10 MED ORDER — METOCLOPRAMIDE HCL 10 MG PO TABS: 10.0000 mg | ORAL_TABLET | Freq: Four times a day (QID) | ORAL | 0 refills | Status: DC

## 2017-03-10 MED ORDER — PROMETHAZINE HCL 12.5 MG PO TABS: 12.5000 mg | ORAL_TABLET | Freq: Four times a day (QID) | ORAL | 0 refills | Status: DC | PRN

## 2017-03-10 NOTE — ED Notes (Signed)
No answer in [facility]

## 2017-03-10 NOTE — Discharge Instructions (Signed)

## 2017-03-10 NOTE — MAU Provider Note (Signed)
[name] is a 21 y.o. G1P0 at [redacted]w[redacted]d who presents to MAU today for pregnancy verification. The patient denies abdominal pain or vaginal bleeding today.   BP (!) 144/81 (BP Location: Right Arm)   Temp 98.1 F (36.7 C) (Oral)   Resp 18   LMP 01/23/2017   CONSTITUTIONAL: Well-developed, well-nourished female in no acute distress.  CARDIOVASCULAR: Regular heart rate RESPIRATORY: Normal effort NEUROLOGICAL: Alert and oriented to person, place, and time.  SKIN: Skin is warm and dry. No rash noted. Not diaphoretic. No erythema. No pallor. PSYCH: Normal mood and affect. Normal behavior. Normal judgment and thought content.  MDM [facility] UPT Medical Screen Exam Complete  Results for orders placed or performed during the hospital encounter of 03/10/17 (from the past 24 hour(s))  Urinalysis, Routine w reflex microscopic     Status: Abnormal   Collection Time: 03/10/17 10:03 PM  Result Value Ref Range   Color, Urine YELLOW YELLOW   APPearance HAZY (A) CLEAR   Specific Gravity, Urine 1.020 1.005 - 1.030   pH 6.0 5.0 - 8.0   Glucose, UA NEGATIVE NEGATIVE mg/dL   Hgb urine dipstick NEGATIVE NEGATIVE   Bilirubin Urine NEGATIVE NEGATIVE   Ketones, ur NEGATIVE NEGATIVE mg/dL   Protein, ur NEGATIVE NEGATIVE mg/dL   Nitrite NEGATIVE NEGATIVE   Leukocytes, UA NEGATIVE NEGATIVE  Pregnancy, urine POC     Status: Abnormal   Collection Time: 03/10/17 11:04 PM  Result Value Ref Range   Preg Test, Ur POSITIVE (A) NEGATIVE    A: Amenorrhea  Morning Sickness  P: Discharge from MAU Patient advised to follow-up with [facility] for prenatal care Rx for PNV Gummies, Phenergan and Reglan Patient may return to MAU as needed or if her condition were to change or worsen  Patient verbalized an understanding of the plan of care and agrees.   Laury Deep, CNM  03/10/2017 11:22 PM

## 2017-03-10 NOTE — ED Notes (Signed)
No answer in waiting area.

## 2017-03-10 NOTE — ED Notes (Signed)
Tried to discharge pt because she didn't answer and it says she is at [facility] and will not allow me to discharge her

## 2017-03-10 NOTE — MAU Note (Signed)
Pt reports pregnancy test was positive tonight at home. Pt feeling nauseated for 2 weeks. Pt reports LMP 11/14. Pt denies pain, but having some urinary frequency.

## 2017-03-12 ENCOUNTER — Inpatient Hospital Stay (HOSPITAL_COMMUNITY)
Admission: AD | Admit: 2017-03-12 | Discharge: 2017-03-13 | Disposition: A | Discharge status: Home / Self Care | Payer: Medicaid Other | Source: Ambulatory Visit | Attending: Family Medicine | Admitting: Family Medicine

## 2017-03-12 ENCOUNTER — Encounter (HOSPITAL_COMMUNITY): Payer: Self-pay

## 2017-03-12 ENCOUNTER — Inpatient Hospital Stay (HOSPITAL_COMMUNITY): Payer: Medicaid Other

## 2017-03-12 DIAGNOSIS — Z885 Allergy status to narcotic agent status: Secondary | ICD-10-CM | POA: Insufficient documentation

## 2017-03-12 DIAGNOSIS — N760 Acute vaginitis: Secondary | ICD-10-CM

## 2017-03-12 DIAGNOSIS — Z3A01 Less than 8 weeks gestation of pregnancy: Secondary | ICD-10-CM | POA: Diagnosis not present

## 2017-03-12 DIAGNOSIS — O23591 Infection of other part of genital tract in pregnancy, first trimester: Secondary | ICD-10-CM | POA: Insufficient documentation

## 2017-03-12 DIAGNOSIS — O26899 Other specified pregnancy related conditions, unspecified trimester: Secondary | ICD-10-CM

## 2017-03-12 DIAGNOSIS — Z8349 Family history of other endocrine, nutritional and metabolic diseases: Secondary | ICD-10-CM | POA: Insufficient documentation

## 2017-03-12 DIAGNOSIS — Z3491 Encounter for supervision of normal pregnancy, unspecified, first trimester: Secondary | ICD-10-CM

## 2017-03-12 DIAGNOSIS — Z8379 Family history of other diseases of the digestive system: Secondary | ICD-10-CM | POA: Insufficient documentation

## 2017-03-12 DIAGNOSIS — E669 Obesity, unspecified: Secondary | ICD-10-CM | POA: Insufficient documentation

## 2017-03-12 DIAGNOSIS — Z833 Family history of diabetes mellitus: Secondary | ICD-10-CM | POA: Diagnosis not present

## 2017-03-12 DIAGNOSIS — O99211 Obesity complicating pregnancy, first trimester: Secondary | ICD-10-CM | POA: Diagnosis not present

## 2017-03-12 DIAGNOSIS — T148XXA Other injury of unspecified body region, initial encounter: Secondary | ICD-10-CM

## 2017-03-12 DIAGNOSIS — R102 Pelvic and perineal pain: Secondary | ICD-10-CM | POA: Insufficient documentation

## 2017-03-12 DIAGNOSIS — B9689 Other specified bacterial agents as the cause of diseases classified elsewhere: Secondary | ICD-10-CM | POA: Diagnosis not present

## 2017-03-12 DIAGNOSIS — O26891 Other specified pregnancy related conditions, first trimester: Secondary | ICD-10-CM | POA: Insufficient documentation

## 2017-03-12 LAB — WET PREP, GENITAL
Sperm: NONE SEEN
TRICH WET PREP: NONE SEEN
YEAST WET PREP: NONE SEEN

## 2017-03-12 LAB — CBC
HCT: 36.2 % (ref 36.0–46.0)
Hemoglobin: 12.3 g/dL (ref 12.0–15.0)
MCH: 27.3 pg (ref 26.0–34.0)
MCHC: 34 g/dL (ref 30.0–36.0)
MCV: 80.3 fL (ref 78.0–100.0)
Platelets: 230 10*3/uL (ref 150–400)
RBC: 4.51 MIL/uL (ref 3.87–5.11)
RDW: 14.5 % (ref 11.5–15.5)
WBC: 10.2 10*3/uL (ref 4.0–10.5)

## 2017-03-12 LAB — HCG, QUANTITATIVE, PREGNANCY: hCG, Beta Chain, Quant, S: 33494 m[IU]/mL — ABNORMAL HIGH (ref <5)
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
## 2017-03-12 NOTE — MAU Note (Signed)
Pt c/o pelvic pain suddenly after picking up a large bag of dog food at work today. Pain continues and rates it 6/10. Denies any bleeding.

## 2017-03-12 NOTE — L&D Delivery Note (Signed)
Obstetrical Delivery Note   Date of Delivery:   10/25/2017 Primary OB:    [facility] Gestational Age/EDD: [redacted]w[redacted]d Reason for Admission: IOL for cHTN Antepartum complications: cHTN (on labetalol bid), GDMa1, BMI 40  Delivered By:   Durene Romans MD  Delivery Type:   spontaneous vaginal delivery  Delivery Details:   Patient had uncomplicated SVD. Anesthesia:    epidural Intrapartum complications: None GBS:    Positive (she received at least two doses of PCN) Laceration:    Left peri-uretheral (repaired with 3-0 vicryl rapide) Episiotomy:    none Rectal exam:   deferred Placenta:    Delivered and expressed via active management. Intact: yes. To pathology: no.  Delayed Cord Clamping: yes Estimated Blood Loss:  293mL  Baby:    Liveborn female, APGARs 6/8, weight 3385gm  Durene Romans. MD Attending [facility])

## 2017-03-13 DIAGNOSIS — B9689 Other specified bacterial agents as the cause of diseases classified elsewhere: Secondary | ICD-10-CM

## 2017-03-13 DIAGNOSIS — R102 Pelvic and perineal pain: Secondary | ICD-10-CM

## 2017-03-13 DIAGNOSIS — O26891 Other specified pregnancy related conditions, first trimester: Secondary | ICD-10-CM

## 2017-03-13 DIAGNOSIS — Z3A01 Less than 8 weeks gestation of pregnancy: Secondary | ICD-10-CM

## 2017-03-13 DIAGNOSIS — N760 Acute vaginitis: Secondary | ICD-10-CM

## 2017-03-13 LAB — GC/CHLAMYDIA PROBE AMP (~~LOC~~) NOT AT ARMC
Chlamydia: NEGATIVE
Neisseria Gonorrhea: NEGATIVE

## 2017-03-13 MED ORDER — METRONIDAZOLE 500 MG PO TABS: 500.0000 mg | ORAL_TABLET | Freq: Two times a day (BID) | ORAL | 0 refills | Status: DC

## 2017-03-13 NOTE — Discharge Instructions (Signed)
Muscle Strain A muscle strain (pulled muscle) happens when a muscle is stretched beyond normal length. It happens when a sudden, violent force stretches your muscle too far. Usually, a few of the fibers in your muscle are torn. Muscle strain is common in athletes. Recovery usually takes 1-2 weeks. Complete healing takes 5-6 weeks. Follow these instructions at home:  Follow the PRICE method of treatment to help your injury get better. Do this the first 2-3 days after the injury: ? Protect. Protect the muscle to keep it from getting injured again. ? Rest. Limit your activity and rest the injured body part. ? Ice. Put ice in a plastic bag. Place a towel between your skin and the bag. Then, apply the ice and leave it on from 15-20 minutes each hour. After the third day, switch to moist heat packs. ? Compression. Use a splint or elastic bandage on the injured area for comfort. Do not put it on too tightly. ? Elevate. Keep the injured body part above the level of your heart.  Only take medicine as told by your doctor.  Warm up before doing exercise to prevent future muscle strains. Contact a doctor if:  You have more pain or puffiness (swelling) in the injured area.  You feel numbness, tingling, or notice a loss of strength in the injured area. This information is not intended to replace advice given to you by your health care provider. Make sure you discuss any questions you have with your health care provider. Document Released: 12/06/2007 Document Revised: 08/04/2015 Document Reviewed: 09/25/2012 Elsevier Interactive Patient Education  2017 [employer] Bacterial Vaginosis Bacterial vaginosis is an infection of the vagina. It happens when too many germs (bacteria) grow in the vagina. This infection puts you at risk for infections from sex (STIs). Treating this infection can lower your risk for some STIs. You should also treat this if you are pregnant. It can cause your baby to be born  early. Follow these instructions at home: Medicines  Take over-the-counter and prescription medicines only as told by your doctor.  Take or use your antibiotic medicine as told by your doctor. Do not stop taking or using it even if you start to feel better. General instructions  If you your sexual partner is a woman, tell her that you have this infection. She needs to get treatment if she has symptoms. If you have a female partner, he does not need to be treated.  During treatment: ? Avoid sex. ? Do not douche. ? Avoid alcohol as told. ? Avoid breastfeeding as told.  Drink enough fluid to keep your pee (urine) clear or pale yellow.  Keep your vagina and butt (rectum) clean. ? Wash the area with warm water every day. ? Wipe from front to back after you use the toilet.  Keep all follow-up visits as told by your doctor. This is important. Preventing this condition  Do not douche.  Use only warm water to wash around your vagina.  Use protection when you have sex. This includes: ? Latex condoms. ? Dental dams.  Limit how many people you have sex with. It is best to only have sex with the same person (be monogamous).  Get tested for STIs. Have your partner get tested.  Wear underwear that is cotton or lined with cotton.  Avoid tight pants and pantyhose. This is most important in summer.  Do not use any products that have nicotine or tobacco in them. These include cigarettes and e-cigarettes. If you  need help quitting, ask your doctor.  Do not use illegal drugs.  Limit how much alcohol you drink. Contact a doctor if:  Your symptoms do not get better, even after you are treated.  You have more discharge or pain when you pee (urinate).  You have a fever.  You have pain in your belly (abdomen).  You have pain with sex.  Your bleed from your vagina between periods. Summary  This infection happens when too many germs (bacteria) grow in the vagina.  Treating this  condition can lower your risk for some infections from sex (STIs).  You should also treat this if you are pregnant. It can cause early (premature) birth.  Do not stop taking or using your antibiotic medicine even if you start to feel better. This information is not intended to replace advice given to you by your health care provider. Make sure you discuss any questions you have with your health care provider. Document Released: 12/06/2007 Document Revised: 11/12/2015 Document Reviewed: 11/12/2015 Elsevier Interactive Patient Education  2017 [employer].

## 2017-03-28 ENCOUNTER — Encounter: Payer: Medicaid Other | Admitting: Family Medicine

## 2017-03-28 NOTE — Progress Notes (Deleted)
Leslie Skinner is a 23 y.o. yo G1P0 at [redacted]w[redacted]d who presents for her initial prenatal visit. Pregnancy {is/is not:9024} planned She reports {pregnancy symptoms:18128}. She  {is/is not:9024} taking PNV. See flow sheet for details.  PMH, POBH, FH, meds, allergies and Social Hx reviewed.  Prenatal Exam: Gen: Well nourished, well developed.  No distress.  Vitals noted. HEENT: Normocephalic, atraumatic.  Neck supple without cervical lymphadenopathy, thyromegaly or thyroid nodules.  Fair dentition. CV: RRR no murmur, gallops or rubs Lungs: CTAB.  Normal respiratory effort without wheezes or rales. Abd: soft, NTND. +BS.  Uterus not appreciated above pelvis. GU: Normal external female genitalia without lesions.  Normal vaginal, well rugated without lesions. No vaginal discharge.  Bimanual exam: No adnexal mass or TTP. No CMT.  Uterus size *** Ext: No clubbing, cyanosis or edema. Psych: Normal grooming and dress.  Not depressed or anxious appearing.  Normal thought content and process without flight of ideas or looseness of associations.  Assessment & Plan: 1) 22 y.o. yo G1P0 at [redacted]w[redacted]d via {Ob dating:14516} doing well.  Current pregnancy issues include ***. Dating {is/is not:9024} reliable. Prenatal labs reviewed, notable for ***. Genetic screening offered: ***. Early glucola {is/is [id number] indicated.  PHQ-9 and Pregnancy Medical Home forms completed and reviewed.  Bleeding and pain precautions reviewed. Importance of prenatal vitamins reviewed.  Follow up in 4 weeks.

## 2017-04-02 ENCOUNTER — Other Ambulatory Visit (HOSPITAL_COMMUNITY)
Admission: RE | Admit: 2017-04-02 | Discharge: 2017-04-02 | Disposition: A | Discharge status: Home / Self Care | Payer: Medicaid Other | Source: Ambulatory Visit | Attending: Family Medicine | Admitting: Family Medicine

## 2017-04-02 ENCOUNTER — Encounter: Payer: Self-pay | Admitting: Internal Medicine

## 2017-04-02 ENCOUNTER — Ambulatory Visit (INDEPENDENT_AMBULATORY_CARE_PROVIDER_SITE_OTHER): Payer: Medicaid Other | Admitting: Internal Medicine

## 2017-04-02 ENCOUNTER — Other Ambulatory Visit: Payer: Self-pay

## 2017-04-02 DIAGNOSIS — Z3401 Encounter for supervision of normal first pregnancy, first trimester: Secondary | ICD-10-CM | POA: Diagnosis present

## 2017-04-02 DIAGNOSIS — O0990 Supervision of high risk pregnancy, unspecified, unspecified trimester: Secondary | ICD-10-CM | POA: Insufficient documentation

## 2017-04-02 DIAGNOSIS — Z23 Encounter for immunization: Secondary | ICD-10-CM

## 2017-04-02 LAB — POCT 1 HR PRENATAL GLUCOSE: Glucose 1 Hr Prenatal, POC: 73 mg/dL

## 2017-04-02 NOTE — Patient Instructions (Signed)
It was so nice to meet you! Congratulations on your pregnancy!  I have ordered all of your prenatal labs today. I will call you with these results.  Your next OB appointment will be in 4 weeks. Please schedule this with Dr. Emmaline Life.  -Dr. Brett Albino

## 2017-04-02 NOTE — Progress Notes (Signed)
Leslie Skinner is a 22 y.o. yo G2P0 at [redacted]w[redacted]d who presents for her initial prenatal visit. Pregnancy is planned She reports breast tenderness, morning sickness and nausea. She  is taking PNV. See flow sheet for details.  PMH, POBH, FH, meds, allergies and Social Hx reviewed.  Prenatal Exam: Gen: Well nourished, well developed.  No distress.  Vitals noted. HEENT: Normocephalic, atraumatic.  Neck supple without cervical lymphadenopathy, thyromegaly or thyroid nodules.  Fair dentition. CV: RRR no murmur, gallops or rubs Lungs: CTAB.  Normal respiratory effort without wheezes or rales. Abd: soft, NTND. +BS.  Uterus not appreciated above pelvis. GU: Normal external female genitalia without lesions.  Normal vaginal, well rugated without lesions. No vaginal discharge.  Bimanual exam: No adnexal mass or TTP. No CMT.  Ext: No clubbing, cyanosis or edema. Psych: Normal grooming and dress.  Not depressed or anxious appearing.  Normal thought content and process without flight of ideas or looseness of associations.  Assessment & Plan: 1) 22 y.o. yo G2P0 at [redacted]w[redacted]d via LMP doing well.  Current pregnancy issues include: 1. Maternal obesity- BMI 39. Discussed goal weight gain of 11-20lbs this pregnancy. 2. Prediabetes- diet-controlled. Last A1c 5.5 on 07/31/16. Early glucola ordered today. 3. Hx hypertension- diet controlled. Discussed low salt diet. BP normal today. Will monitor closely. 4. Nausea/vomiting in pregnancy- has been prescribed Reglan and Phenergan by MAU 5. Vaginal discharge- diagnosed with BV in the MAU and currently taking Flagyl. Dating is reliable. Prenatal labs ordered today. Flu shot given today. Genetic screening offered: yes- patient declined. Early glucola is indicated.  PHQ-9 and Pregnancy Medical Home forms completed and reviewed.  Bleeding and pain precautions reviewed. Importance of prenatal vitamins reviewed.  Follow up in 4 weeks.

## 2017-04-03 LAB — CERVICOVAGINAL ANCILLARY ONLY
CHLAMYDIA, DNA PROBE: NEGATIVE
NEISSERIA GONORRHEA: NEGATIVE

## 2017-04-04 ENCOUNTER — Telehealth: Payer: Self-pay | Admitting: Internal Medicine

## 2017-04-04 LAB — OBSTETRIC PANEL, INCLUDING HIV
ANTIBODY SCREEN: NEGATIVE
BASOS: 0 %
Basophils Absolute: 0 10*3/uL (ref 0.0–0.2)
EOS (ABSOLUTE): 0.1 10*3/uL (ref 0.0–0.4)
EOS: 1 %
HEMATOCRIT: 36.7 % (ref 34.0–46.6)
HEMOGLOBIN: 12.7 g/dL (ref 11.1–15.9)
HEP B S AG: NEGATIVE
HIV Screen 4th Generation wRfx: NONREACTIVE
IMMATURE GRANS (ABS): 0 10*3/uL (ref 0.0–0.1)
Immature Granulocytes: 0 %
LYMPHS: 30 %
Lymphocytes Absolute: 2.5 10*3/uL (ref 0.7–3.1)
MCH: 27.6 pg (ref 26.6–33.0)
MCHC: 34.6 g/dL (ref 31.5–35.7)
MCV: 80 fL (ref 79–97)
MONOCYTES: 8 %
Monocytes Absolute: 0.7 10*3/uL (ref 0.1–0.9)
Neutrophils Absolute: 5.2 10*3/uL (ref 1.4–7.0)
Neutrophils: 61 %
Platelets: 235 10*3/uL (ref 150–379)
RBC: 4.6 x10E6/uL (ref 3.77–5.28)
RDW: 15.4 % (ref 12.3–15.4)
RPR: NONREACTIVE
RUBELLA: 6.72 {index} (ref >0.99)
Rh Factor: POSITIVE
WBC: 8.5 10*3/uL (ref 3.4–10.8)

## 2017-04-04 LAB — SICKLE CELL SCREEN: SICKLE CELL SCREEN: NEGATIVE

## 2017-04-04 NOTE — Telephone Encounter (Signed)
Called patient to let her know that her prenatal labs were all normal. Patient did not answer and did not have a voicemail box set up.  Hyman Bible, MD PGY-3

## 2017-04-09 ENCOUNTER — Telehealth: Payer: Self-pay | Admitting: Internal Medicine

## 2017-04-09 LAB — URINE CULTURE, OB REFLEX

## 2017-04-09 LAB — CULTURE, OB URINE

## 2017-04-09 NOTE — Telephone Encounter (Signed)
Called patient to discuss results of screening OB urine culture at initial prenatal visit. Patient did not answer phone and unable to leave voicemail. Urine culture grew 25,000-50,000 CFU of E. Coli. Per up-to-date, diagnostic criteria for asymptomatic bacteruria in pregnancy is >100,000 CFU of a single organism. Patient does not meet criteria for treatment with antibiotics at this time. Could consider repeating another urine culture at next prenatal visit.  Hyman Bible, MD PGY-3

## 2017-04-15 ENCOUNTER — Encounter: Payer: Self-pay | Admitting: General Practice

## 2017-04-15 ENCOUNTER — Encounter: Payer: Medicaid Other | Admitting: Advanced Practice Midwife

## 2017-05-02 ENCOUNTER — Encounter: Payer: Self-pay | Admitting: Obstetrics & Gynecology

## 2017-05-02 ENCOUNTER — Encounter: Payer: Self-pay | Admitting: General Practice

## 2017-05-02 ENCOUNTER — Ambulatory Visit (INDEPENDENT_AMBULATORY_CARE_PROVIDER_SITE_OTHER): Payer: Medicaid Other | Admitting: Obstetrics & Gynecology

## 2017-05-02 ENCOUNTER — Other Ambulatory Visit: Payer: Self-pay

## 2017-05-02 VITALS — BP 126/69 | HR 92 | Wt 232.7 lb

## 2017-05-02 DIAGNOSIS — M4100 Infantile idiopathic scoliosis, site unspecified: Secondary | ICD-10-CM

## 2017-05-02 DIAGNOSIS — G43819 Other migraine, intractable, without status migrainosus: Secondary | ICD-10-CM

## 2017-05-02 DIAGNOSIS — O0992 Supervision of high risk pregnancy, unspecified, second trimester: Secondary | ICD-10-CM

## 2017-05-02 DIAGNOSIS — O99212 Obesity complicating pregnancy, second trimester: Secondary | ICD-10-CM

## 2017-05-02 DIAGNOSIS — E669 Obesity, unspecified: Secondary | ICD-10-CM

## 2017-05-02 DIAGNOSIS — O0990 Supervision of high risk pregnancy, unspecified, unspecified trimester: Secondary | ICD-10-CM

## 2017-05-02 DIAGNOSIS — I10 Essential (primary) hypertension: Secondary | ICD-10-CM

## 2017-05-02 DIAGNOSIS — Z3400 Encounter for supervision of normal first pregnancy, unspecified trimester: Secondary | ICD-10-CM

## 2017-05-03 ENCOUNTER — Encounter: Payer: Self-pay | Admitting: Obstetrics & Gynecology

## 2017-05-03 NOTE — Progress Notes (Signed)
   PRENATAL VISIT NOTE  Subjective:  Leslie Skinner is a 22 y.o. G1P0 at [redacted]w[redacted]d being seen today for ongoing prenatal care.  She is currently monitored for the following issues for this high-risk pregnancy and has Hypertension; GERD (gastroesophageal reflux disease); Migraine; Scoliosis; Obesity (BMI 30-39.9); Anxiety state, unspecified; Pre-diabetes; and Supervision of normal first pregnancy on their problem list.  Patient reports occasional dizziness, frequesnt urinatioin.  Contractions: Not present. Vag. Bleeding: None.   . Denies leaking of fluid.   The following portions of the patient's history were reviewed and updated as appropriate: allergies, current medications, past family history, past medical history, past social history, past surgical history and problem list. Problem list updated.  Objective:   Vitals:   05/02/17 1545  BP: 126/69  Pulse: 92  Weight: 232 lb 11.2 oz (105.6 kg)    Fetal Status: Fetal Heart Rate (bpm): 158         General:  Alert, oriented and cooperative. Patient is in no acute distress.  Skin: Skin is warm and dry. No rash noted.   Cardiovascular: Normal heart rate noted  Respiratory: Normal respiratory effort, no problems with respiration noted  Abdomen: Soft, gravid, appropriate for gestational age.  Pain/Pressure: Absent     Pelvic: Cervical exam deferred        Extremities: Normal range of motion.  Edema: None  Mental Status:  Normal mood and affect. Normal behavior. Normal judgment and thought content.   Assessment and Plan:  Pregnancy: G1P0 at [redacted]w[redacted]d  1. Supervision of normal first pregnancy, antepartum Was first seen at [facility] this pregnancy.  2. Supervision of high risk pregnancy, antepartum - [facility]B BABYSCRIPTS OPT IN - Genetic Screening--Pt wants Panorama. - Protein / creatinine ratio, urine - Comprehensive metabolic panel - Cystic fibrosis gene test - SMN1 COPY NUMBER ANALYSIS (SMA Carrier Screen) - [place] MFM OB COMP + 14  WK; Future  3. Essential hypertension Reviewed multiple visits when she was not pregnant.  There were 4 visits in the past 2 years of elevated BP (mild 140s/90s).  Pt should be treated like a hypertensive.    4. Infantile idiopathic scoliosis, unspecified spinal region Pt received the diagnosis in [facility], but no follow up  5. Obesity (BMI 30-39.9) 12-20 pould weight gain; encouraged more towards 12 pounds; early GDM screen is negative  Preterm labor symptoms and general obstetric precautions including but not limited to vaginal bleeding, contractions, leaking of fluid and fetal movement were reviewed in detail with the patient. Please refer to After Visit Summary for other counseling recommendations.   RTC 4 weeks  Silas Sacramento, MD

## 2017-05-06 ENCOUNTER — Encounter: Payer: Self-pay | Admitting: *Deleted

## 2017-05-10 ENCOUNTER — Encounter: Payer: Self-pay | Admitting: *Deleted

## 2017-05-10 LAB — COMPREHENSIVE METABOLIC PANEL
ALK PHOS: 76 IU/L (ref 39–117)
ALT: 14 IU/L (ref 0–32)
AST: 15 IU/L (ref 0–40)
Albumin/Globulin Ratio: 1.5 (ref 1.2–2.2)
Albumin: 3.9 g/dL (ref 3.5–5.5)
BUN/Creatinine Ratio: 11 (ref 9–23)
BUN: 8 mg/dL (ref 6–20)
Bilirubin Total: <0.2 mg/dL (ref 0.0–1.2)
CALCIUM: 9.4 mg/dL (ref 8.7–10.2)
CO2: 23 mmol/L (ref 20–29)
CREATININE: 0.71 mg/dL (ref 0.57–1.00)
Chloride: 100 mmol/L (ref 96–106)
GFR calc Af Amer: 139 mL/min/{1.73_m2} (ref >59)
GFR, EST NON AFRICAN AMERICAN: 120 mL/min/{1.73_m2} (ref >59)
GLUCOSE: 95 mg/dL (ref 65–99)
Globulin, Total: 2.6 g/dL (ref 1.5–4.5)
Potassium: 4 mmol/L (ref 3.5–5.2)
Sodium: 138 mmol/L (ref 134–144)
TOTAL PROTEIN: 6.5 g/dL (ref 6.0–8.5)

## 2017-05-10 LAB — SMN1 COPY NUMBER ANALYSIS (SMA CARRIER SCREENING)

## 2017-05-10 LAB — CYSTIC FIBROSIS GENE TEST

## 2017-05-10 LAB — PROTEIN / CREATININE RATIO, URINE
CREATININE, UR: 129.2 mg/dL
Protein, Ur: 25.6 mg/dL
Protein/Creat Ratio: 198 mg/g creat (ref 0–200)

## 2017-05-13 ENCOUNTER — Telehealth: Payer: Self-pay | Admitting: Internal Medicine

## 2017-05-13 ENCOUNTER — Other Ambulatory Visit: Payer: Self-pay | Admitting: Obstetrics & Gynecology

## 2017-05-13 MED ORDER — ASPIRIN 81 MG PO TABS: 81.0000 mg | ORAL_TABLET | Freq: Every day | ORAL | 6 refills | Status: DC

## 2017-05-13 NOTE — Telephone Encounter (Signed)
Pt is calling because she has some questions about her pregnancy and some of the symptoms she is having, jw

## 2017-05-13 NOTE — Progress Notes (Signed)
Pt started on baby aspirin for history of HTN.

## 2017-05-14 NOTE — Telephone Encounter (Signed)
Please let patient know she should call her OBGYN, Dr. Gala Romney who is caring for her pregnancy at [place] to discuss questions regarding her pregnancy.  Thanks Leslie Skinner

## 2017-05-14 NOTE — Telephone Encounter (Signed)
Called patient and left message to cal Dr. Gala Romney with questions regarding pregnancy per Dr. Emmaline Life.[name], CMA

## 2017-05-15 ENCOUNTER — Telehealth: Payer: Self-pay | Admitting: *Deleted

## 2017-05-15 NOTE — Telephone Encounter (Signed)
Leslie Skinner called 05/14/17 pm and left a voicemessage she is calling about  Swelling in her face. States this is her first pregnancy and wants to know what she can take for the swellling. I called [facility] and left a message I am returning her phone call and if she is having severe symptoms such as  A severe headache, etc she should go to Maternity assessment unit here at [facility] for evaluation- if not severe-call us back.

## 2017-05-15 NOTE — Telephone Encounter (Signed)
Also received message from provider: Guss Bunde, MD  [facility]        Pt needs to be start on baby aspirin. RN to call. Pt does not have My Chart    I called patient again and left a message I am calling about a new prescription your provider has ordered- please call our office.

## 2017-05-16 ENCOUNTER — Telehealth: Payer: Self-pay | Admitting: Family Medicine

## 2017-05-16 NOTE — Telephone Encounter (Signed)
Patient called wanting to speak to a RN about her swelling, and a Rx she was suppose to get. Please give patient a call back.

## 2017-05-23 NOTE — Telephone Encounter (Signed)
Pt's concerns and questions are being addressed in a different encounter.

## 2017-05-23 NOTE — Telephone Encounter (Signed)
Called pt and left message stating that I am returning her call to answer her questions and to provide information about a newly recommended medication. Please call back and leave a message stating whether a detailed message can be left on her voice mail and the current status of her concern (facial swelling). She could also sign up for MyChart so that we can send information to her in a secure manner.

## 2017-05-29 ENCOUNTER — Ambulatory Visit (INDEPENDENT_AMBULATORY_CARE_PROVIDER_SITE_OTHER): Payer: Medicaid Other | Admitting: Family Medicine

## 2017-05-29 VITALS — BP 124/76 | HR 102 | Wt 231.7 lb

## 2017-05-29 DIAGNOSIS — O10919 Unspecified pre-existing hypertension complicating pregnancy, unspecified trimester: Secondary | ICD-10-CM

## 2017-05-29 DIAGNOSIS — Z3400 Encounter for supervision of normal first pregnancy, unspecified trimester: Secondary | ICD-10-CM

## 2017-05-29 MED ORDER — PRENATAL COMPLETE 14-0.4 MG PO TABS: 1.0000 | ORAL_TABLET | Freq: Every day | ORAL | 3 refills | Status: DC

## 2017-05-29 NOTE — Patient Instructions (Signed)
Exercise During Pregnancy For people of all ages, exercise is an important part of being healthy. Exercise improves heart and lung function and helps to maintain strength, flexibility, and a healthy body weight. Exercise also boosts energy levels and elevates mood. For most women, maintaining an exercise routine throughout pregnancy is recommended. It is only on rare occasions and with certain medical conditions or pregnancy complications that women may be asked to limit or avoid exercise during pregnancy. What are some other benefits to exercising during pregnancy? Along with maintaining strength and flexibility, exercising throughout pregnancy can help to:  Keep strength in muscles that are very important during labor and childbirth.  Decrease low back pain during pregnancy.  Decrease the risk of developing gestational diabetes mellitus (GDM).  Improve blood sugar (glucose) control for women who have GDM.  Decrease the risk of developing preeclampsia. This is a serious condition that causes high blood pressure along with other symptoms, such as swelling and headaches.  Decrease the risk of cesarean delivery.  Speed up the recovery after giving birth.  How often should I exercise? Unless your health care provider gives you different instructions, you should try to exercise on most days or all days of the week. In general, try to exercise with moderate intensity for about 150 minutes per week. This can be spread out across several days, such as exercising for 30 minutes per day on 5 days of each week. You can tell that you are exercising at a moderate intensity if you have a higher heart rate and faster breathing, but you are still able to hold a conversation. What types of moderate-intensity exercise are recommended during pregnancy? There are many types of exercise that are safe for you to do during pregnancy. Unless your health care provider gives you different instructions, do a variety of  exercises that safely increase your heart and breathing (cardiopulmonary) rates and help you to build and maintain muscle strength (strength training). You should always be able to talk in full sentences while exercising during pregnancy. Some examples of exercising that is safe to do during pregnancy include:  Brisk walking or hiking.  Swimming.  Water aerobics.  Riding a stationary bike.  Strength training.  Modified yoga or Pilates. Tell your instructor that you are pregnant. Avoid overstretching and avoid lying on your back for long periods of time.  Running or jogging. Only choose this type of exercise if: ? You ran or jogged regularly before your pregnancy. ? You can run or jog and still talk in complete sentences.  What types of exercise should I not do during pregnancy? Depending on your level of fitness and whether you exercised regularly before your pregnancy, you may be advised to limit vigorous-intensity exercise during your pregnancy. You can tell that you are exercising at a vigorous intensity if you are breathing much harder and faster and cannot hold a conversation while exercising. Some examples of exercising that you should avoid during pregnancy include:  Contact sports.  Activities that place you at risk for falling on or being hit in the belly, such as downhill skiing, water skiing, surfing, rock climbing, cycling, gymnastics, and horseback riding.  Scuba diving.  Sky diving.  Yoga or Pilates in a room that is heated to extreme temperatures ("hot yoga" or "hot Pilates").  Jogging or running, unless you ran or jogged regularly before your pregnancy. While jogging or running, you should always be able to talk in full sentences. Do not run or jog so vigorously  that you are unable to have a conversation.  If you are not used to exercising at elevation (more than 6,000 feet above sea level), do not do so during your pregnancy.  When should I avoid exercising  during pregnancy? Certain medical conditions can make it unsafe to exercise during pregnancy, or they may increase your risk of miscarriage or early labor and birth. Some of these conditions include:  Some types of heart disease.  Some types of lung disease.  Placenta previa. This is when the placenta partially or completely covers the opening of the uterus (cervix).  Frequent bleeding from the vagina during your pregnancy.  Incompetent cervix. This is when your cervix does not remain as tightly closed during pregnancy as it should.  Premature labor.  Ruptured membranes. This is when the protective sac (amniotic sac) opens up and amniotic fluid leaks from your vagina.  Severely low blood count (anemia).  Preeclampsia or pregnancy-caused high blood pressure.  Carrying more than one baby (multiple gestation) and having an additional risk of early labor.  Poorly controlled diabetes.  Being severely underweight or severely overweight.  Intrauterine growth restriction. This is when your baby's growth and development during pregnancy are slower than expected.  Other medical conditions. Ask your health care provider if any apply to you.  What else should I know about exercising during pregnancy? You should take these precautions while exercising during pregnancy:  Avoid overheating. ? Wear loose-fitting, breathable clothes. ? Do not exercise in very high temperatures.  Avoid dehydration. Drink enough water before, during, and after exercise to keep your urine clear or pale yellow.  Avoid overstretching. Because of hormone changes during pregnancy, it is easy to overstretch muscles, tendons, and ligaments during pregnancy.  Start slowly and ask your health care provider to recommend types of exercise that are safe for you, if exercising regularly is new for you.  Pregnancy is not a time for exercising to lose weight. When should I seek medical care? You should stop exercising  and call your health care provider if you have any unusual symptoms, such as:  Mild uterine contractions or abdominal cramping.  Dizziness that does not improve with rest.  When should I seek immediate medical care? You should stop exercising and call your local emergency services (911 in the [place] if you have any unusual symptoms, such as:  Sudden, severe pain in your low back or your belly.  Uterine contractions or abdominal cramping that do not improve with rest.  Chest pain.  Bleeding or fluid leaking from your vagina.  Shortness of breath.  This information is not intended to replace advice given to you by your health care provider. Make sure you discuss any questions you have with your health care provider. Document Released: 02/26/2005 Document Revised: 07/27/2015 Document Reviewed: 05/06/2014 Elsevier Interactive Patient Education  2018 [employer]  

## 2017-05-29 NOTE — Progress Notes (Signed)
   PRENATAL VISIT NOTE  Subjective:  Leslie Skinner is a 22 y.o. G1P0 at [redacted]w[redacted]d being seen today for ongoing prenatal care.  She is currently monitored for the following issues for this high-risk pregnancy and has Chronic hypertension during pregnancy, antepartum; GERD (gastroesophageal reflux disease); Migraine; Scoliosis; Obesity (BMI 30-39.9); Anxiety state, unspecified; Pre-diabetes; and Supervision of normal first pregnancy on their problem list.  Patient reports no complaints.  Contractions: Not present. Vag. Bleeding: None.  Movement: Absent. Denies leaking of fluid.   The following portions of the patient's history were reviewed and updated as appropriate: allergies, current medications, past family history, past medical history, past social history, past surgical history and problem list. Problem list updated.  Objective:   Vitals:   05/29/17 1353  BP: 124/76  Pulse: (!) 102  Weight: 231 lb 11.2 oz (105.1 kg)    Fetal Status: Fetal Heart Rate (bpm): 143   Movement: Absent     General:  Alert, oriented and cooperative. Patient is in no acute distress.  Skin: Skin is warm and dry. No rash noted.   Cardiovascular: Normal heart rate noted  Respiratory: Normal respiratory effort, no problems with respiration noted  Abdomen: Soft, gravid, appropriate for gestational age.  Pain/Pressure: Present     Pelvic: Cervical exam deferred        Extremities: Normal range of motion.  Edema: Trace  Mental Status:  Normal mood and affect. Normal behavior. Normal judgment and thought content.   Assessment and Plan:  Pregnancy: G1P0 at [redacted]w[redacted]d  1. Supervision of normal first pregnancy, antepartum  2. Chronic hypertension during pregnancy, antepartum Has not been taking daily aspirin. Advised to start ASAP.  Preterm labor symptoms and general obstetric precautions including but not limited to vaginal bleeding, contractions, leaking of fluid and fetal movement were reviewed in detail with the  patient. Please refer to After Visit Summary for other counseling recommendations.  Return in about 4 weeks (around 06/26/2017).   Dannielle Huh, DO

## 2017-05-30 ENCOUNTER — Encounter (HOSPITAL_COMMUNITY): Payer: Self-pay | Admitting: Obstetrics & Gynecology

## 2017-06-04 NOTE — Telephone Encounter (Signed)
Called patient to make sure she received message about starting baby aspirin. The phone number listed is not in service. Chart flag to start baby aspirin.

## 2017-06-05 ENCOUNTER — Other Ambulatory Visit: Payer: Self-pay | Admitting: Obstetrics & Gynecology

## 2017-06-05 ENCOUNTER — Ambulatory Visit (HOSPITAL_COMMUNITY)
Admission: RE | Admit: 2017-06-05 | Discharge: 2017-06-05 | Disposition: A | Discharge status: Home / Self Care | Payer: Medicaid Other | Source: Ambulatory Visit | Attending: Obstetrics & Gynecology | Admitting: Obstetrics & Gynecology

## 2017-06-05 DIAGNOSIS — O99212 Obesity complicating pregnancy, second trimester: Secondary | ICD-10-CM | POA: Insufficient documentation

## 2017-06-05 DIAGNOSIS — O0992 Supervision of high risk pregnancy, unspecified, second trimester: Secondary | ICD-10-CM | POA: Insufficient documentation

## 2017-06-05 DIAGNOSIS — Z3689 Encounter for other specified antenatal screening: Secondary | ICD-10-CM | POA: Insufficient documentation

## 2017-06-05 DIAGNOSIS — O0990 Supervision of high risk pregnancy, unspecified, unspecified trimester: Secondary | ICD-10-CM

## 2017-06-05 DIAGNOSIS — Z3A19 19 weeks gestation of pregnancy: Secondary | ICD-10-CM | POA: Insufficient documentation

## 2017-06-06 NOTE — Telephone Encounter (Signed)
Patient was seen for an ob fu 05/29/17 and issues addressed.

## 2017-06-26 ENCOUNTER — Ambulatory Visit (INDEPENDENT_AMBULATORY_CARE_PROVIDER_SITE_OTHER): Payer: Medicaid Other | Admitting: Obstetrics & Gynecology

## 2017-06-26 VITALS — BP 143/84 | HR 107 | Wt 235.6 lb

## 2017-06-26 DIAGNOSIS — O10919 Unspecified pre-existing hypertension complicating pregnancy, unspecified trimester: Secondary | ICD-10-CM

## 2017-06-26 DIAGNOSIS — O10912 Unspecified pre-existing hypertension complicating pregnancy, second trimester: Secondary | ICD-10-CM

## 2017-06-26 DIAGNOSIS — O99212 Obesity complicating pregnancy, second trimester: Secondary | ICD-10-CM

## 2017-06-26 DIAGNOSIS — Z3402 Encounter for supervision of normal first pregnancy, second trimester: Secondary | ICD-10-CM

## 2017-06-26 LAB — POCT URINALYSIS DIP (DEVICE)
BILIRUBIN URINE: NEGATIVE
GLUCOSE, UA: NEGATIVE mg/dL
Hgb urine dipstick: NEGATIVE
KETONES UR: NEGATIVE mg/dL
Nitrite: NEGATIVE
Protein, ur: NEGATIVE mg/dL
Specific Gravity, Urine: 1.015 (ref 1.005–1.030)
Urobilinogen, UA: 0.2 mg/dL (ref 0.0–1.0)
pH: 7 (ref 5.0–8.0)

## 2017-06-26 MED ORDER — LABETALOL HCL 100 MG PO TABS: 100.0000 mg | ORAL_TABLET | Freq: Two times a day (BID) | ORAL | 1 refills | Status: DC

## 2017-06-26 NOTE — Progress Notes (Signed)
   PRENATAL VISIT NOTE  Subjective:  Leslie Skinner is a 22 y.o. G1P0 at [redacted]w[redacted]d being seen today for ongoing prenatal care.  She is currently monitored for the following issues for this high-risk pregnancy and has Chronic hypertension during pregnancy, antepartum; GERD (gastroesophageal reflux disease); Migraine; Scoliosis; Obesity, Class III, BMI 40-49.9 (morbid obesity) ([place]); Anxiety state, unspecified; Pre-diabetes; and Supervision of normal first pregnancy on their problem list.  Patient reports no complaints.  Contractions: Not present. Vag. Bleeding: None.  Movement: Present. Denies leaking of fluid.   The following portions of the patient's history were reviewed and updated as appropriate: allergies, current medications, past family history, past medical history, past social history, past surgical history and problem list. Problem list updated.  Objective:   Vitals:   06/26/17 1545 06/26/17 1546  BP: (!) 166/72 (!) 143/84  Pulse: (!) 101 (!) 107  Weight: 235 lb 9.6 oz (106.9 kg)     Fetal Status: Fetal Heart Rate (bpm): 161   Movement: Present     General:  Alert, oriented and cooperative. Patient is in no acute distress.  Skin: Skin is warm and dry. No rash noted.   Cardiovascular: Normal heart rate noted  Respiratory: Normal respiratory effort, no problems with respiration noted  Abdomen: Soft, gravid, appropriate for gestational age.  Pain/Pressure: Present     Pelvic: Cervical exam deferred        Extremities: Normal range of motion.  Edema: Trace  Mental Status: Normal mood and affect. Normal behavior. Normal judgment and thought content.   Assessment and Plan:  Pregnancy: G1P0 at [redacted]w[redacted]d  1. Chronic hypertension during pregnancy, antepartum HTN, previously on lisinopril, HCTZ - labetalol (NORMODYNE) 100 MG tablet; Take 1 tablet (100 mg total) by mouth 2 (two) times daily.  Dispense: 60 tablet; Refill: 1 - [place] MFM OB FOLLOW UP; Future  2. Encounter for supervision of  normal first pregnancy in second trimester   3. Obesity, Class III, BMI 40-49.9 (morbid obesity) ([place])   Preterm labor symptoms and general obstetric precautions including but not limited to vaginal bleeding, contractions, leaking of fluid and fetal movement were reviewed in detail with the patient. Please refer to After Visit Summary for other counseling recommendations.  Return in about 2 weeks (around 07/10/2017).  Future Appointments  Date Time Provider [place]  07/12/2017  2:55 PM Starr Lake, CNM [facility] WOC    Emeterio Reeve, MD

## 2017-06-26 NOTE — Patient Instructions (Signed)

## 2017-07-12 ENCOUNTER — Ambulatory Visit (INDEPENDENT_AMBULATORY_CARE_PROVIDER_SITE_OTHER): Payer: Medicaid Other | Admitting: Student

## 2017-07-12 ENCOUNTER — Encounter: Payer: Self-pay | Admitting: Family Medicine

## 2017-07-12 VITALS — BP 119/68 | HR 94 | Wt 238.8 lb

## 2017-07-12 DIAGNOSIS — O10919 Unspecified pre-existing hypertension complicating pregnancy, unspecified trimester: Secondary | ICD-10-CM

## 2017-07-12 DIAGNOSIS — Z3402 Encounter for supervision of normal first pregnancy, second trimester: Secondary | ICD-10-CM

## 2017-07-12 DIAGNOSIS — R7303 Prediabetes: Secondary | ICD-10-CM

## 2017-07-12 NOTE — Patient Instructions (Addendum)
CIRCUMCISION  Circumcision is considered an elective/non-medically necessary procedure. There are many reasons parents decide to have their sons circumsized. During the first year of life circumcised males have a reduced risk of urinary tract infections but after this year the rates between circumcised males and uncircumcised males are the same.  It is safe to have your son circumcised outside of the hospital and the places above perform them regularly.    Places to have your son circumcised:    [facility] [phone] $[phone] by 4 wks  [facility] [phone] $244 by 4 wks  [facility] [phone] $175 by 2 wks  [facility] [phone] $250 by 7 days [phone] [phone] $150 by 4 wks  These prices sometimes change but are roughly what you can expect to pay. Please call and confirm pricing.    Glucose Tolerance Test During Pregnancy The glucose tolerance test (GTT) is a blood test used to determine if you have developed a type of diabetes during pregnancy (gestational diabetes). This is when your body does not properly process sugar (glucose) in the food you eat, resulting in high blood glucose levels. Typically, a GTT is done after you have had a 1-hour glucose test with results that indicate you possibly have gestational diabetes. It may also be done if:  You have a history of giving birth to very large babies or have experienced repeated fetal loss (stillbirth).  You have signs and symptoms of diabetes, such as: ? Changes in your vision. ? Tingling or numbness in your hands or feet. ? Changes in hunger, thirst, and urination not otherwise explained by your pregnancy.  The GTT lasts about 3 hours. You will be given a sugar-water solution to drink at the beginning of the test. You will have blood drawn before you drink the  solution and then again 1, 2, and 3 hours after you drink it. You will not be allowed to eat or drink anything else during the test. You must remain at the testing location to make sure that your blood is drawn on time. You should also avoid exercising during the test, because exercise can alter test results. How do I prepare for this test? Eat normally for 3 days prior to the GTT test, including having plenty of carbohydrate-rich foods. Do not eat or drink anything except water during the final 12 hours before the test. In addition, your health care provider may ask you to stop taking certain medicines before the test. What do the results mean? It is your responsibility to obtain your test results. Ask the lab or department performing the test when and how you will get your results. Contact your health care provider to discuss any questions you have about your results. Range of Normal Values Ranges for normal values may vary among different labs and hospitals. You should always check with your health care provider after having lab work or other tests done to discuss whether your values are considered within normal limits. Normal levels of blood glucose are as follows:  Fasting: less than 105 mg/dL.  1 hour after drinking the solution: less than 190 mg/dL.  2 hours after drinking the solution: less than 165 mg/dL.  3 hours after drinking the solution: less than 145 mg/dL.  Some substances can interfere with GTT results. These may include:  Blood pressure and heart failure medicines, including beta blockers, furosemide, and thiazides.  Anti-inflammatory medicines, including aspirin.  Nicotine.  Some psychiatric medicines.  Meaning of Results Outside Normal Value Ranges GTT test results  that are above normal values may indicate a number of health problems, such as:  Gestational diabetes.  Acute stress response.  Cushing syndrome.  Tumors such as pheochromocytoma or  glucagonoma.  Long-term kidney problems.  Pancreatitis.  Hyperthyroidism.  Current infection.  Discuss your test results with your health care provider. He or she will use the results to make a diagnosis and determine a treatment plan that is right for you. This information is not intended to replace advice given to you by your health care provider. Make sure you discuss any questions you have with your health care provider. Document Released: 08/28/2011 Document Revised: 08/04/2015 Document Reviewed: 07/03/2013 Elsevier Interactive Patient Education  [employer].

## 2017-07-12 NOTE — Progress Notes (Signed)
-  not on medication; diagnosed with diabetes when she was "[age]" -was on metformin and she controlled with weight and diet and then went off; she can't rememebr when.  -keep on bp med

## 2017-07-13 LAB — HEMOGLOBIN A1C
ESTIMATED AVERAGE GLUCOSE: 105 mg/dL
Hgb A1c MFr Bld: 5.3 % (ref 4.8–5.6)

## 2017-07-13 NOTE — Progress Notes (Signed)
   PRENATAL VISIT NOTE  Subjective:  Leslie Skinner is a 22 y.o. G1P0 at [redacted]w[redacted]d being seen today for ongoing prenatal care.  She is currently monitored for the following issues for this high-risk pregnancy and has Chronic hypertension during pregnancy, antepartum; GERD (gastroesophageal reflux disease); Migraine; Scoliosis; Obesity, Class III, BMI 40-49.9 (morbid obesity) ([place]); Anxiety state, unspecified; Pre-diabetes; and Supervision of normal first pregnancy on their problem list.  Patient reports some pelvic pressure. .  Contractions: Not present. Vag. Bleeding: None.  Movement: Present. Denies leaking of fluid.   Patient is taking baby ASA and BP meds. Clarified with patient that she was diagnosed with diabetes when she was much younger (we have HgA1c going back to 2013), but that she was on metformin and then was able to control it with diet and weight. Has never been on insulin and is not on any insulin in this pregnancy.  The following portions of the patient's history were reviewed and updated as appropriate: allergies, current medications, past family history, past medical history, past social history, past surgical history and problem list. Problem list updated.  Objective:   Vitals:   07/12/17 1531  BP: 119/68  Pulse: 94  Weight: 238 lb 12.8 oz (108.3 kg)    Fetal Status: Fetal Heart Rate (bpm): 154 Fundal Height: 26 cm Movement: Present     General:  Alert, oriented and cooperative. Patient is in no acute distress.  Skin: Skin is warm and dry. No rash noted.   Cardiovascular: Normal heart rate noted  Respiratory: Normal respiratory effort, no problems with respiration noted  Abdomen: Soft, gravid, appropriate for gestational age.  Pain/Pressure: Present     Pelvic: Cervical exam deferred        Extremities: Normal range of motion.  Edema: Trace  Mental Status: Normal mood and affect. Normal behavior. Normal judgment and thought content.   Assessment and Plan:    Pregnancy: G1P0 at [redacted]w[redacted]d  1. Chronic hypertension during pregnancy, antepartum -Growth [place] for 28 weeks ordered, plan for GTT and antenatal testing at 28 weeks.  -Continue BP meds and ASA  - HgB A1c - [place] MFM OB FOLLOW UP; Future  2. Encounter for supervision of normal first pregnancy in second trimester   3. Pre-diabetes -Do not have current HgB A1c so will draw today   Preterm labor symptoms and general obstetric precautions including but not limited to vaginal bleeding, contractions, leaking of fluid and fetal movement were reviewed in detail with the patient. Please refer to After Visit Summary for other counseling recommendations.  Return in about 1 month (around 08/09/2017), or HROB with an MD and  2 hour gtt , also needs a  BPP weekly between 28 and 32 weeks. .  Future Appointments  Date Time Provider [place]  08/09/2017  8:15 AM WOC-WOCA NST WOC-WOCA WOC  08/09/2017  9:15 AM Truett Mainland, DO WOC-WOCA WOC  08/09/2017 10:00 AM WOC-WOCA LAB WOC-WOCA WOC  08/12/2017 11:00 AM WH-MFC [place] 3 WH-MFCUS MFC-US    Kathryn Lorraine Kooistra, CNM

## 2017-08-08 ENCOUNTER — Other Ambulatory Visit: Payer: Self-pay | Admitting: *Deleted

## 2017-08-08 DIAGNOSIS — Z3400 Encounter for supervision of normal first pregnancy, unspecified trimester: Secondary | ICD-10-CM

## 2017-08-09 ENCOUNTER — Ambulatory Visit (INDEPENDENT_AMBULATORY_CARE_PROVIDER_SITE_OTHER): Payer: Medicaid Other | Admitting: Family Medicine

## 2017-08-09 ENCOUNTER — Other Ambulatory Visit: Payer: Medicaid Other

## 2017-08-09 ENCOUNTER — Encounter: Payer: Self-pay | Admitting: Family Medicine

## 2017-08-09 VITALS — BP 124/62 | HR 95 | Wt 239.0 lb

## 2017-08-09 DIAGNOSIS — O0990 Supervision of high risk pregnancy, unspecified, unspecified trimester: Secondary | ICD-10-CM

## 2017-08-09 DIAGNOSIS — O2340 Unspecified infection of urinary tract in pregnancy, unspecified trimester: Secondary | ICD-10-CM

## 2017-08-09 DIAGNOSIS — Z3400 Encounter for supervision of normal first pregnancy, unspecified trimester: Secondary | ICD-10-CM

## 2017-08-09 DIAGNOSIS — O24419 Gestational diabetes mellitus in pregnancy, unspecified control: Secondary | ICD-10-CM

## 2017-08-09 DIAGNOSIS — O10919 Unspecified pre-existing hypertension complicating pregnancy, unspecified trimester: Secondary | ICD-10-CM

## 2017-08-09 LAB — POCT URINALYSIS DIP (DEVICE)
BILIRUBIN URINE: NEGATIVE
Glucose, UA: NEGATIVE mg/dL
Hgb urine dipstick: NEGATIVE
Ketones, ur: NEGATIVE mg/dL
NITRITE: POSITIVE — AB
PH: 6 (ref 5.0–8.0)
PROTEIN: NEGATIVE mg/dL
Specific Gravity, Urine: 1.02 (ref 1.005–1.030)
Urobilinogen, UA: 0.2 mg/dL (ref 0.0–1.0)

## 2017-08-09 MED ORDER — SULFAMETHOXAZOLE-TRIMETHOPRIM 800-160 MG PO TABS
1.0000 | ORAL_TABLET | Freq: Two times a day (BID) | ORAL | 1 refills | Status: DC
Start: 2017-08-09 — End: 2017-09-04

## 2017-08-09 NOTE — Progress Notes (Signed)
   PRENATAL VISIT NOTE  Subjective:  Leslie Skinner is a 22 y.o. G1P0 at [redacted]w[redacted]d being seen today for ongoing prenatal care.  She is currently monitored for the following issues for this high-risk pregnancy and has Chronic hypertension during pregnancy, antepartum; GERD (gastroesophageal reflux disease); Migraine; Scoliosis; Obesity, Class III, BMI 40-49.9 (morbid obesity) ([place]); Anxiety state, unspecified; Pre-diabetes; and Supervision of high risk pregnancy, antepartum on their problem list.  Patient reports no complaints.  Contractions: Not present. Vag. Bleeding: None.  Movement: Present. Denies leaking of fluid.   The following portions of the patient's history were reviewed and updated as appropriate: allergies, current medications, past family history, past medical history, past social history, past surgical history and problem list. Problem list updated.  Objective:   Vitals:   08/09/17 1013  BP: 124/62  Pulse: 95  Weight: 239 lb (108.4 kg)    Fetal Status: Fetal Heart Rate (bpm): 148   Movement: Present     General:  Alert, oriented and cooperative. Patient is in no acute distress.  Skin: Skin is warm and dry. No rash noted.   Cardiovascular: Normal heart rate noted  Respiratory: Normal respiratory effort, no problems with respiration noted  Abdomen: Soft, gravid, appropriate for gestational age.  Pain/Pressure: Present     Pelvic: Cervical exam deferred        Extremities: Normal range of motion.  Edema: Trace  Mental Status: Normal mood and affect. Normal behavior. Normal judgment and thought content.   Assessment and Plan:  Pregnancy: G1P0 at [redacted]w[redacted]d  1. Chronic hypertension during pregnancy, antepartum BP controlled. Has [place] scheduled for 6/3 - Culture, OB Urine  2. Obesity, Class III, BMI 40-49.9 (morbid obesity) ([facility])  - Culture, OB Urine  3. Supervision of high risk pregnancy, antepartum  4. Urinary tract infection in mother during pregnancy,  antepartum Bactrim prescribed. - Culture, OB Urine  Preterm labor symptoms and general obstetric precautions including but not limited to vaginal bleeding, contractions, leaking of fluid and fetal movement were reviewed in detail with the patient. Please refer to After Visit Summary for other counseling recommendations.  Return in about 2 weeks (around 08/23/2017) for HR OB f/u.  Future Appointments  Date Time Provider [place]  08/12/2017 11:00 AM WH-MFC [place] 3 WH-MFCUS MFC-US    Jacob J Stinson, DO

## 2017-08-12 ENCOUNTER — Ambulatory Visit (HOSPITAL_COMMUNITY): Admission: RE | Admit: 2017-08-12 | Payer: Medicaid Other | Source: Ambulatory Visit

## 2017-08-13 ENCOUNTER — Other Ambulatory Visit (HOSPITAL_COMMUNITY): Payer: Self-pay | Admitting: *Deleted

## 2017-08-13 ENCOUNTER — Encounter (HOSPITAL_COMMUNITY): Payer: Self-pay

## 2017-08-13 ENCOUNTER — Ambulatory Visit (HOSPITAL_COMMUNITY)
Admission: RE | Admit: 2017-08-13 | Discharge: 2017-08-13 | Disposition: A | Discharge status: Home / Self Care | Payer: Medicaid Other | Source: Ambulatory Visit | Attending: Student | Admitting: Student

## 2017-08-13 DIAGNOSIS — O10919 Unspecified pre-existing hypertension complicating pregnancy, unspecified trimester: Secondary | ICD-10-CM

## 2017-08-13 DIAGNOSIS — Z3A28 28 weeks gestation of pregnancy: Secondary | ICD-10-CM | POA: Insufficient documentation

## 2017-08-13 DIAGNOSIS — O10913 Unspecified pre-existing hypertension complicating pregnancy, third trimester: Secondary | ICD-10-CM

## 2017-08-13 LAB — URINE CULTURE, OB REFLEX

## 2017-08-13 LAB — CBC
Hematocrit: 34.1 % (ref 34.0–46.6)
Hemoglobin: 11.6 g/dL (ref 11.1–15.9)
MCH: 27.4 pg (ref 26.6–33.0)
MCHC: 34 g/dL (ref 31.5–35.7)
MCV: 81 fL (ref 79–97)
PLATELETS: 240 10*3/uL (ref 150–450)
RBC: 4.23 x10E6/uL (ref 3.77–5.28)
RDW: 15.3 % (ref 12.3–15.4)
WBC: 10.3 10*3/uL (ref 3.4–10.8)

## 2017-08-13 LAB — GLUCOSE TOLERANCE, 2 HOURS W/ 1HR
GLUCOSE, 1 HOUR: 100 mg/dL (ref 65–179)
GLUCOSE, 2 HOUR: 124 mg/dL (ref 65–152)
GLUCOSE, FASTING: 94 mg/dL — AB (ref 65–91)

## 2017-08-13 LAB — RPR: RPR Ser Ql: NONREACTIVE

## 2017-08-13 LAB — CULTURE, OB URINE

## 2017-08-13 LAB — HIV ANTIBODY (ROUTINE TESTING W REFLEX): HIV SCREEN 4TH GENERATION: NONREACTIVE

## 2017-08-13 MED ORDER — ACCU-CHEK FASTCLIX LANCETS MISC: 1.0000 [IU] | Freq: Four times a day (QID) | 12 refills | Status: DC

## 2017-08-13 MED ORDER — ACCU-CHEK NANO SMARTVIEW W/DEVICE KIT: 1.0000 | PACK | 0 refills | Status: DC

## 2017-08-13 MED ORDER — GLUCOSE BLOOD VI STRP: ORAL_STRIP | 12 refills | Status: DC

## 2017-08-13 NOTE — Addendum Note (Signed)
Addended by: Truett Mainland on: 08/13/2017 02:35 PM   Modules accepted: Orders

## 2017-08-14 ENCOUNTER — Telehealth: Payer: Self-pay | Admitting: General Practice

## 2017-08-14 DIAGNOSIS — O24410 Gestational diabetes mellitus in pregnancy, diet controlled: Secondary | ICD-10-CM

## 2017-08-14 MED ORDER — GLUCOSE BLOOD VI STRP: ORAL_STRIP | 12 refills | Status: DC

## 2017-08-14 MED ORDER — ACCU-CHEK GUIDE W/DEVICE KIT: 1.0000 | PACK | Freq: Once | 0 refills | Status: AC

## 2017-08-14 NOTE — Telephone Encounter (Signed)
-----   Message from Truett Mainland, DO sent at 08/13/2017  2:36 PM EDT ----- Patient's 2hr GTT positive - has gestational diabetes. Patient referred for DM teaching. Machine, test strips, and lancets prescribed. Please notify patient.

## 2017-08-14 NOTE — Telephone Encounter (Signed)
Patient called back into office and I informed her of results, testing supplies sent to pharmacy & discussed appt to meet with diabetes educator. Patient verbalized understanding to all and states she can come the 11th at 10am. Patient asked if this would effect her baby or if she needs to take a medication for this. Told patient at her next doctor's appt they can go into further detail about diabetes in pregnancy but generally baby's tolerate it well unless blood sugars are severely out of range. Told patient we will review her blood sugars at her doctor's appt to see if she needs medication or if diet alone is sufficient treatment. Patient verbalized understanding & had no questions.

## 2017-08-14 NOTE — Telephone Encounter (Signed)
Called patient, no answer- left message on voicemail stating we are trying to reach you with results, please call us back. 

## 2017-08-17 ENCOUNTER — Encounter (HOSPITAL_COMMUNITY): Payer: Self-pay | Admitting: *Deleted

## 2017-08-17 ENCOUNTER — Inpatient Hospital Stay (HOSPITAL_COMMUNITY)
Admission: AD | Admit: 2017-08-17 | Discharge: 2017-08-18 | Disposition: A | Discharge status: Home / Self Care | Payer: Medicaid Other | Source: Ambulatory Visit | Attending: Obstetrics & Gynecology | Admitting: Obstetrics & Gynecology

## 2017-08-17 DIAGNOSIS — O10013 Pre-existing essential hypertension complicating pregnancy, third trimester: Secondary | ICD-10-CM | POA: Diagnosis not present

## 2017-08-17 DIAGNOSIS — O24419 Gestational diabetes mellitus in pregnancy, unspecified control: Secondary | ICD-10-CM | POA: Insufficient documentation

## 2017-08-17 DIAGNOSIS — Z7722 Contact with and (suspected) exposure to environmental tobacco smoke (acute) (chronic): Secondary | ICD-10-CM | POA: Diagnosis not present

## 2017-08-17 DIAGNOSIS — O26893 Other specified pregnancy related conditions, third trimester: Secondary | ICD-10-CM | POA: Insufficient documentation

## 2017-08-17 DIAGNOSIS — R102 Pelvic and perineal pain: Secondary | ICD-10-CM | POA: Insufficient documentation

## 2017-08-17 DIAGNOSIS — Z3A29 29 weeks gestation of pregnancy: Secondary | ICD-10-CM | POA: Diagnosis not present

## 2017-08-17 DIAGNOSIS — O26899 Other specified pregnancy related conditions, unspecified trimester: Secondary | ICD-10-CM | POA: Diagnosis not present

## 2017-08-17 NOTE — MAU Note (Signed)
Having pain in pelvic area and noticed panties wet earlier today. Pt thinks she has chronic HTN but has not taken meds in few days due to making her sick. Also thinks she is GDM

## 2017-08-18 DIAGNOSIS — Z3A29 29 weeks gestation of pregnancy: Secondary | ICD-10-CM

## 2017-08-18 DIAGNOSIS — O26899 Other specified pregnancy related conditions, unspecified trimester: Secondary | ICD-10-CM | POA: Diagnosis not present

## 2017-08-18 DIAGNOSIS — R102 Pelvic and perineal pain: Secondary | ICD-10-CM

## 2017-08-18 LAB — URINALYSIS, ROUTINE W REFLEX MICROSCOPIC
Bilirubin Urine: NEGATIVE
GLUCOSE, UA: NEGATIVE mg/dL
Hgb urine dipstick: NEGATIVE
KETONES UR: NEGATIVE mg/dL
Nitrite: NEGATIVE
PH: 6 (ref 5.0–8.0)
Protein, ur: NEGATIVE mg/dL
Specific Gravity, Urine: 1.025 (ref 1.005–1.030)

## 2017-08-18 MED ORDER — ACETAMINOPHEN 500 MG PO TABS
1000.0000 mg | ORAL_TABLET | Freq: Once | ORAL | Status: AC
  Administered 2017-08-18: 1000 mg via ORAL
  Filled 2017-08-18: qty 2

## 2017-08-18 NOTE — Discharge Instructions (Signed)
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
## 2017-08-19 LAB — CULTURE, OB URINE

## 2017-08-20 ENCOUNTER — Ambulatory Visit: Payer: Medicaid Other | Admitting: *Deleted

## 2017-08-20 ENCOUNTER — Encounter: Payer: Medicaid Other | Attending: Family Medicine | Admitting: *Deleted

## 2017-08-20 DIAGNOSIS — Z3A00 Weeks of gestation of pregnancy not specified: Secondary | ICD-10-CM | POA: Diagnosis not present

## 2017-08-20 DIAGNOSIS — O24410 Gestational diabetes mellitus in pregnancy, diet controlled: Secondary | ICD-10-CM | POA: Diagnosis not present

## 2017-08-20 DIAGNOSIS — Z713 Dietary counseling and surveillance: Secondary | ICD-10-CM | POA: Diagnosis not present

## 2017-08-20 NOTE — Progress Notes (Signed)
  Patient was seen on 08/20/2017 for Gestational Diabetes self-management. EDD 10/30/2017. Patient states she was diagnosed with Type 2 diabetes as a 22 year old child, and her blood sugars came into normal ranges when she improved her eating habits and managed her weight better. Her most recent A1c's have been in the low 5%, below the Pre-Diabetes numbers, but she failed her fasting BG when taking the GTT.  Patient states no history of GDM. Diet history obtained. Patient eats poor variety of all food groups with at least 2 meals a day from fast food and beverages include mostly sweetened beverages including Kool-aid, sweet tea, regular soda and fruit punches.  The following learning objectives were met by the patient :   States the definition of Gestational Diabetes  States why dietary management is important in controlling blood glucose  Describes the effects of carbohydrates on blood glucose levels  Demonstrates ability to create a balanced meal plan  Demonstrates carbohydrate counting   States when to check blood glucose levels  Demonstrates proper blood glucose monitoring techniques  States the effect of stress and exercise on blood glucose levels  States the importance of limiting caffeine and abstaining from alcohol and smoking  States understanding of very high carbohydrate content of sweetened beverages and their potential effect on BG control  Plan:  Aim for 3 Carb Choices per meal (45 grams) +/- 1 either way  Aim for 1-2 Carbs per snack Begin reading food labels for Total Carbohydrate of foods Consider  increasing your activity level by walking or other activity daily as tolerated Begin checking BG before breakfast and 2 hours after first bite of breakfast, lunch and dinner as directed by MD  Bring Log Book/Sheet to every medical appointment   Patient was introduced to [employer] and plans to use as record of BG electronically  Take medication if directed by MD  Blood  glucose monitor Rx called into pharmacy:  Accu Check Guide with Fast Clix drums but she has not picked it up yet.  Patient instructed to test pre breakfast and 2 hours each meal as directed by MD  Patient instructed to monitor glucose levels: FBS: 60 - 95 mg/dl 2 hour: <120 mg/dl  Patient received the following handouts:  Nutrition Diabetes and Pregnancy  Carbohydrate Counting List  BG Log Sheet as back up to Baby [employer]s  Patient will be seen for follow-up in 1 month and  as needed.

## 2017-08-26 ENCOUNTER — Ambulatory Visit (INDEPENDENT_AMBULATORY_CARE_PROVIDER_SITE_OTHER): Payer: Medicaid Other | Admitting: Obstetrics & Gynecology

## 2017-08-26 DIAGNOSIS — O0993 Supervision of high risk pregnancy, unspecified, third trimester: Secondary | ICD-10-CM | POA: Diagnosis present

## 2017-08-26 DIAGNOSIS — Z23 Encounter for immunization: Secondary | ICD-10-CM | POA: Diagnosis not present

## 2017-08-26 DIAGNOSIS — O0990 Supervision of high risk pregnancy, unspecified, unspecified trimester: Secondary | ICD-10-CM

## 2017-08-26 LAB — POCT URINALYSIS DIP (DEVICE)
BILIRUBIN URINE: NEGATIVE
GLUCOSE, UA: NEGATIVE mg/dL
Hgb urine dipstick: NEGATIVE
Ketones, ur: NEGATIVE mg/dL
Leukocytes, UA: NEGATIVE
Nitrite: NEGATIVE
Protein, ur: NEGATIVE mg/dL
SPECIFIC GRAVITY, URINE: 1.025 (ref 1.005–1.030)
Urobilinogen, UA: 0.2 mg/dL (ref 0.0–1.0)
pH: 7 (ref 5.0–8.0)

## 2017-08-26 MED ORDER — COMFORT FIT MATERNITY SUPP LG MISC: 1.0000 [IU] | Freq: Every day | 0 refills | Status: DC

## 2017-08-26 NOTE — Patient Instructions (Signed)

## 2017-08-26 NOTE — Progress Notes (Signed)
   PRENATAL VISIT NOTE  Subjective:  Leslie Skinner is a 22 y.o. G1P0 at [redacted]w[redacted]d being seen today for ongoing prenatal care.  She is currently monitored for the following issues for this high-risk pregnancy and has Chronic hypertension during pregnancy, antepartum; GERD (gastroesophageal reflux disease); Migraine; Scoliosis; Obesity, Class III, BMI 40-49.9 (morbid obesity) ([place]); Anxiety state, unspecified; Pre-diabetes; and Supervision of high risk pregnancy, antepartum on their problem list.  Patient reports no complaints.  Contractions: Not present. Vag. Bleeding: None.  Movement: Present. Denies leaking of fluid.   The following portions of the patient's history were reviewed and updated as appropriate: allergies, current medications, past family history, past medical history, past social history, past surgical history and problem list. Problem list updated.  Objective:   Vitals:   08/26/17 1401  BP: 126/68  Pulse: 91  Weight: 239 lb 12.8 oz (108.8 kg)    Fetal Status: Fetal Heart Rate (bpm): 154   Movement: Present     General:  Alert, oriented and cooperative. Patient is in no acute distress.  Skin: Skin is warm and dry. No rash noted.   Cardiovascular: Normal heart rate noted  Respiratory: Normal respiratory effort, no problems with respiration noted  Abdomen: Soft, gravid, appropriate for gestational age.  Pain/Pressure: Present     Pelvic: Cervical exam deferred        Extremities: Normal range of motion.  Edema: Trace  Mental Status: Normal mood and affect. Normal behavior. Normal judgment and thought content.   Assessment and Plan:  Pregnancy: G1P0 at [redacted]w[redacted]d  1. Supervision of high risk pregnancy, antepartum Pelvic pressure - Tdap vaccine greater than or equal to 7yo IM - Elastic Bandages & Supports (COMFORT FIT MATERNITY SUPP LG) MISC; 1 Units by Does not apply route daily.  Dispense: 1 each; Refill: 0  Preterm labor symptoms and general obstetric precautions  including but not limited to vaginal bleeding, contractions, leaking of fluid and fetal movement were reviewed in detail with the patient. Please refer to After Visit Summary for other counseling recommendations.  Return in about 9 days (around 09/04/2017) for needs to start NST 32 weeks.  Future Appointments  Date Time Provider [place]  09/10/2017  8:30 AM WH-MFC [place] 1 WH-MFCUS MFC-US  09/19/2017  4:00 PM [place]  10/08/2017  8:30 AM WH-MFC [place] 1 WH-MFCUS MFC-US  Start glucose testing, take abx for UTI  Emeterio Reeve, MD

## 2017-09-04 ENCOUNTER — Ambulatory Visit (INDEPENDENT_AMBULATORY_CARE_PROVIDER_SITE_OTHER): Payer: Medicaid Other | Admitting: Obstetrics & Gynecology

## 2017-09-04 ENCOUNTER — Ambulatory Visit (INDEPENDENT_AMBULATORY_CARE_PROVIDER_SITE_OTHER): Payer: Medicaid Other | Admitting: General Practice

## 2017-09-04 ENCOUNTER — Ambulatory Visit: Payer: Self-pay

## 2017-09-04 VITALS — BP 121/65 | HR 102 | Wt 240.0 lb

## 2017-09-04 DIAGNOSIS — O0990 Supervision of high risk pregnancy, unspecified, unspecified trimester: Secondary | ICD-10-CM

## 2017-09-04 DIAGNOSIS — O10919 Unspecified pre-existing hypertension complicating pregnancy, unspecified trimester: Secondary | ICD-10-CM

## 2017-09-04 DIAGNOSIS — O10913 Unspecified pre-existing hypertension complicating pregnancy, third trimester: Secondary | ICD-10-CM

## 2017-09-04 LAB — POCT URINALYSIS DIP (DEVICE)
Bilirubin Urine: NEGATIVE
Glucose, UA: NEGATIVE mg/dL
HGB URINE DIPSTICK: NEGATIVE
Ketones, ur: NEGATIVE mg/dL
Nitrite: POSITIVE — AB
Protein, ur: NEGATIVE mg/dL
SPECIFIC GRAVITY, URINE: 1.025 (ref 1.005–1.030)
UROBILINOGEN UA: 1 mg/dL (ref 0.0–1.0)
pH: 7 (ref 5.0–8.0)

## 2017-09-04 NOTE — Progress Notes (Signed)
Pt informed that the ultrasound is considered a limited OB ultrasound and is not intended to be a complete ultrasound exam.  Patient also informed that the ultrasound is not being completed with the intent of assessing for fetal or placental anomalies or any pelvic abnormalities.  Explained that the purpose of today's ultrasound is to assess for  BPP, presentation and AFI.  Patient acknowledges the purpose of the exam and the limitations of the study.    

## 2017-09-04 NOTE — Addendum Note (Signed)
Addended by: Shelly Coss on: 09/04/2017 04:42 PM   Modules accepted: Orders

## 2017-09-04 NOTE — Progress Notes (Signed)
   PRENATAL VISIT NOTE  Subjective:  Leslie Skinner is a 22 y.o. single G1P0 ( son- Mathis Bud) at [redacted]w[redacted]d being seen today for ongoing prenatal care.  She is currently monitored for the following issues for this high-risk pregnancy and has Chronic hypertension during pregnancy, antepartum; GERD (gastroesophageal reflux disease); Migraine; Scoliosis; Obesity, Class III, BMI 40-49.9 (morbid obesity) ([place]); Anxiety state, unspecified; Pre-diabetes; and Supervision of high risk pregnancy, antepartum on their problem list.  Patient reports no complaints.  Contractions: Not present. Vag. Bleeding: None.  Movement: Present. Denies leaking of fluid.   The following portions of the patient's history were reviewed and updated as appropriate: allergies, current medications, past family history, past medical history, past social history, past surgical history and problem list. Problem list updated.  Objective:   Vitals:   09/04/17 1332  BP: 121/65  Pulse: (!) 102  Weight: 240 lb (108.9 kg)    Fetal Status: Fetal Heart Rate (bpm): NST   Movement: Present     General:  Alert, oriented and cooperative. Patient is in no acute distress.  Skin: Skin is warm and dry. No rash noted.   Cardiovascular: Normal heart rate noted  Respiratory: Normal respiratory effort, no problems with respiration noted  Abdomen: Soft, gravid, appropriate for gestational age.  Pain/Pressure: Present     Pelvic: Cervical exam deferred        Extremities: Normal range of motion.  Edema: Trace  Mental Status: Normal mood and affect. Normal behavior. Normal judgment and thought content.   Assessment and Plan:  Pregnancy: G1P0 at [redacted]w[redacted]d  1. Supervision of high risk pregnancy, antepartum  - Culture, OB Urine  2. Obesity, Class III, BMI 40-49.9 (morbid obesity) ([place])   3. Chronic hypertension during pregnancy, antepartum - great BPs on labetolol 100mg  BID - Culture, OB Urine  Preterm labor symptoms and general obstetric  precautions including but not limited to vaginal bleeding, contractions, leaking of fluid and fetal movement were reviewed in detail with the patient. Please refer to After Visit Summary for other counseling recommendations.  Return in about 2 weeks (around 09/18/2017) for weekly BPP.  Future Appointments  Date Time Provider [place]  09/10/2017  8:30 AM WH-MFC [place] 1 WH-MFCUS MFC-US  09/19/2017  4:00 PM [place]  10/08/2017  8:30 AM WH-MFC [place] 1 WH-MFCUS MFC-US    Emily Filbert, MD

## 2017-09-04 NOTE — Progress Notes (Signed)
I have reviewed this chart and agree with the RN/CMA assessment and management.    Adalynd Donahoe C Angellica Maddison, MD, FACOG Attending Physician, [facility]  

## 2017-09-06 LAB — CULTURE, OB URINE

## 2017-09-06 LAB — URINE CULTURE, OB REFLEX

## 2017-09-10 ENCOUNTER — Ambulatory Visit (HOSPITAL_COMMUNITY)
Admission: RE | Admit: 2017-09-10 | Discharge: 2017-09-10 | Disposition: A | Discharge status: Home / Self Care | Payer: Medicaid Other | Source: Ambulatory Visit | Attending: Student | Admitting: Student

## 2017-09-11 ENCOUNTER — Ambulatory Visit: Payer: Self-pay

## 2017-09-11 ENCOUNTER — Ambulatory Visit (INDEPENDENT_AMBULATORY_CARE_PROVIDER_SITE_OTHER): Payer: Medicaid Other | Admitting: *Deleted

## 2017-09-11 VITALS — BP 117/64 | HR 100 | Wt 243.7 lb

## 2017-09-11 DIAGNOSIS — O10913 Unspecified pre-existing hypertension complicating pregnancy, third trimester: Secondary | ICD-10-CM

## 2017-09-11 DIAGNOSIS — O10919 Unspecified pre-existing hypertension complicating pregnancy, unspecified trimester: Secondary | ICD-10-CM

## 2017-09-11 NOTE — Progress Notes (Signed)
Pt did not go to MFM for [place] yesterday (growth and BPP) - says she did not know about appt and did not receive a reminder call.  Pt has active MyChart - we discussed that all appts are in the [place]. BPP performed today - will attempt to reschedule missed [place] in MFM.

## 2017-09-18 ENCOUNTER — Encounter: Payer: Self-pay | Admitting: Obstetrics and Gynecology

## 2017-09-18 ENCOUNTER — Ambulatory Visit (INDEPENDENT_AMBULATORY_CARE_PROVIDER_SITE_OTHER): Payer: Medicaid Other | Admitting: Obstetrics and Gynecology

## 2017-09-18 ENCOUNTER — Encounter: Payer: Self-pay | Admitting: *Deleted

## 2017-09-18 ENCOUNTER — Ambulatory Visit (INDEPENDENT_AMBULATORY_CARE_PROVIDER_SITE_OTHER): Payer: Medicaid Other | Admitting: *Deleted

## 2017-09-18 VITALS — BP 124/61 | HR 88 | Wt 243.2 lb

## 2017-09-18 DIAGNOSIS — O10913 Unspecified pre-existing hypertension complicating pregnancy, third trimester: Secondary | ICD-10-CM

## 2017-09-18 DIAGNOSIS — O99213 Obesity complicating pregnancy, third trimester: Secondary | ICD-10-CM

## 2017-09-18 DIAGNOSIS — R7303 Prediabetes: Secondary | ICD-10-CM

## 2017-09-18 DIAGNOSIS — O10919 Unspecified pre-existing hypertension complicating pregnancy, unspecified trimester: Secondary | ICD-10-CM

## 2017-09-18 DIAGNOSIS — O0993 Supervision of high risk pregnancy, unspecified, third trimester: Secondary | ICD-10-CM

## 2017-09-18 DIAGNOSIS — O0990 Supervision of high risk pregnancy, unspecified, unspecified trimester: Secondary | ICD-10-CM

## 2017-09-18 NOTE — Progress Notes (Signed)
Pt reports having SOB during physical activity. She states she is checking blood sugars twice daily. 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  [place] for growth and BPP scheduled tomorrow

## 2017-09-18 NOTE — Progress Notes (Signed)
   PRENATAL VISIT NOTE  Subjective:  Leslie Skinner is a 22 y.o. G1P0 at [redacted]w[redacted]d being seen today for ongoing prenatal care.  She is currently monitored for the following issues for this high-risk pregnancy and has Chronic hypertension during pregnancy, antepartum; GERD (gastroesophageal reflux disease); Migraine; Scoliosis; Obesity, Class III, BMI 40-49.9 (morbid obesity) ([place]); Anxiety state, unspecified; Pre-diabetes; and Supervision of high risk pregnancy, antepartum on their problem list.  Patient reports mild cramping.  Contractions: Not present. Vag. Bleeding: None.  Movement: Present. Denies leaking of fluid.   The following portions of the patient's history were reviewed and updated as appropriate: allergies, current medications, past family history, past medical history, past social history, past surgical history and problem list. Problem list updated.  Objective:   Vitals:   09/18/17 0903  BP: 124/61  Pulse: 88  Weight: 243 lb 3.2 oz (110.3 kg)    Fetal Status: Fetal Heart Rate (bpm): NST   Movement: Present     General:  Alert, oriented and cooperative. Patient is in no acute distress.  Skin: Skin is warm and dry. No rash noted.   Cardiovascular: Normal heart rate noted  Respiratory: Normal respiratory effort, no problems with respiration noted  Abdomen: Soft, gravid, appropriate for gestational age.  Pain/Pressure: Present     Pelvic: Cervical exam deferred        Extremities: Normal range of motion.  Edema: Trace  Mental Status: Normal mood and affect. Normal behavior. Normal judgment and thought content.   Assessment and Plan:  Pregnancy: G1P0 at [redacted]w[redacted]d  1. Supervision of high risk pregnancy, antepartum  2. Chronic hypertension during pregnancy, antepartum Cont labetalol 100 mg BID  Cont baby ASA NST reactive BPP/growth scheduled for tomorrow Last growth 6/4/ 40th%tile  3. Obesity, Class III, BMI 40-49.9 (morbid obesity) ([place])  4. Pre-diabetes -has been  checking BG Reports FG 70-80, QHS 70-80s  Preterm labor symptoms and general obstetric precautions including but not limited to vaginal bleeding, contractions, leaking of fluid and fetal movement were reviewed in detail with the patient. Please refer to After Visit Summary for other counseling recommendations.  Return in about 1 week (around 09/25/2017) for weekly as scheduled; Please add appts on 7/31 and 8/7.  Future Appointments  Date Time Provider [place]  09/19/2017  8:15 AM [place] 4 WH-MFCUS MFC-US  09/19/2017  2:00 PM [place]  09/25/2017  3:15 PM WOC-WOCA NST WOC-WOCA WOC  10/02/2017  2:15 PM WOC-WOCA NST WOC-WOCA WOC  10/02/2017  3:15 PM Chancy Milroy, MD WOC-WOCA WOC  10/16/2017  1:00 PM WH-MFC [place] 3 WH-MFCUS MFC-US    Sloan Leiter, MD

## 2017-09-19 ENCOUNTER — Encounter (HOSPITAL_COMMUNITY): Payer: Self-pay

## 2017-09-19 ENCOUNTER — Other Ambulatory Visit (HOSPITAL_COMMUNITY): Payer: Self-pay | Admitting: *Deleted

## 2017-09-19 ENCOUNTER — Ambulatory Visit (HOSPITAL_COMMUNITY)
Admission: RE | Admit: 2017-09-19 | Discharge: 2017-09-19 | Disposition: A | Discharge status: Home / Self Care | Payer: Medicaid Other | Source: Ambulatory Visit | Attending: Maternal and Fetal Medicine | Admitting: Maternal and Fetal Medicine

## 2017-09-19 ENCOUNTER — Other Ambulatory Visit (HOSPITAL_COMMUNITY): Payer: Self-pay | Admitting: Maternal and Fetal Medicine

## 2017-09-19 ENCOUNTER — Other Ambulatory Visit: Payer: Medicaid Other

## 2017-09-19 ENCOUNTER — Other Ambulatory Visit: Payer: Self-pay | Admitting: Obstetrics & Gynecology

## 2017-09-19 DIAGNOSIS — O10913 Unspecified pre-existing hypertension complicating pregnancy, third trimester: Secondary | ICD-10-CM

## 2017-09-19 DIAGNOSIS — Z3A34 34 weeks gestation of pregnancy: Secondary | ICD-10-CM | POA: Diagnosis not present

## 2017-09-19 DIAGNOSIS — O99213 Obesity complicating pregnancy, third trimester: Secondary | ICD-10-CM

## 2017-09-19 DIAGNOSIS — O10919 Unspecified pre-existing hypertension complicating pregnancy, unspecified trimester: Secondary | ICD-10-CM

## 2017-09-19 DIAGNOSIS — O24113 Pre-existing diabetes mellitus, type 2, in pregnancy, third trimester: Secondary | ICD-10-CM | POA: Insufficient documentation

## 2017-09-19 DIAGNOSIS — O10013 Pre-existing essential hypertension complicating pregnancy, third trimester: Secondary | ICD-10-CM | POA: Diagnosis not present

## 2017-09-25 ENCOUNTER — Ambulatory Visit (INDEPENDENT_AMBULATORY_CARE_PROVIDER_SITE_OTHER): Payer: Medicaid Other | Admitting: *Deleted

## 2017-09-25 ENCOUNTER — Ambulatory Visit: Payer: Self-pay

## 2017-09-25 VITALS — BP 121/74 | HR 88 | Wt 243.8 lb

## 2017-09-25 DIAGNOSIS — O10919 Unspecified pre-existing hypertension complicating pregnancy, unspecified trimester: Secondary | ICD-10-CM

## 2017-09-25 DIAGNOSIS — O10913 Unspecified pre-existing hypertension complicating pregnancy, third trimester: Secondary | ICD-10-CM

## 2017-09-25 NOTE — Progress Notes (Signed)

## 2017-10-02 ENCOUNTER — Other Ambulatory Visit (HOSPITAL_COMMUNITY)
Admission: RE | Admit: 2017-10-02 | Discharge: 2017-10-02 | Disposition: A | Discharge status: Home / Self Care | Payer: Medicaid Other | Source: Ambulatory Visit | Attending: Obstetrics and Gynecology | Admitting: Obstetrics and Gynecology

## 2017-10-02 ENCOUNTER — Ambulatory Visit (INDEPENDENT_AMBULATORY_CARE_PROVIDER_SITE_OTHER): Payer: Medicaid Other | Admitting: *Deleted

## 2017-10-02 ENCOUNTER — Ambulatory Visit (INDEPENDENT_AMBULATORY_CARE_PROVIDER_SITE_OTHER): Payer: Medicaid Other | Admitting: Obstetrics and Gynecology

## 2017-10-02 ENCOUNTER — Encounter: Payer: Self-pay | Admitting: Obstetrics and Gynecology

## 2017-10-02 ENCOUNTER — Ambulatory Visit: Payer: Self-pay

## 2017-10-02 ENCOUNTER — Ambulatory Visit (INDEPENDENT_AMBULATORY_CARE_PROVIDER_SITE_OTHER): Payer: Medicaid Other | Admitting: Clinical

## 2017-10-02 VITALS — BP 138/57 | HR 98 | Wt 248.7 lb

## 2017-10-02 DIAGNOSIS — F4323 Adjustment disorder with mixed anxiety and depressed mood: Secondary | ICD-10-CM | POA: Diagnosis not present

## 2017-10-02 DIAGNOSIS — O10913 Unspecified pre-existing hypertension complicating pregnancy, third trimester: Secondary | ICD-10-CM | POA: Diagnosis present

## 2017-10-02 DIAGNOSIS — O10919 Unspecified pre-existing hypertension complicating pregnancy, unspecified trimester: Secondary | ICD-10-CM

## 2017-10-02 DIAGNOSIS — O0993 Supervision of high risk pregnancy, unspecified, third trimester: Secondary | ICD-10-CM | POA: Insufficient documentation

## 2017-10-02 DIAGNOSIS — Z3A36 36 weeks gestation of pregnancy: Secondary | ICD-10-CM | POA: Insufficient documentation

## 2017-10-02 DIAGNOSIS — O0990 Supervision of high risk pregnancy, unspecified, unspecified trimester: Secondary | ICD-10-CM

## 2017-10-02 DIAGNOSIS — O24410 Gestational diabetes mellitus in pregnancy, diet controlled: Secondary | ICD-10-CM

## 2017-10-02 LAB — POCT URINALYSIS DIP (DEVICE)
BILIRUBIN URINE: NEGATIVE
Glucose, UA: NEGATIVE mg/dL
HGB URINE DIPSTICK: NEGATIVE
Ketones, ur: NEGATIVE mg/dL
NITRITE: NEGATIVE
Protein, ur: NEGATIVE mg/dL
SPECIFIC GRAVITY, URINE: 1.02 (ref 1.005–1.030)
Urobilinogen, UA: 1 mg/dL (ref 0.0–1.0)
pH: 6.5 (ref 5.0–8.0)

## 2017-10-02 NOTE — Progress Notes (Signed)
Pt reports constant lower pelvic cramping x2 days.  [place] for growth scheduled on 8/8.  Patient and/or legal guardian verbally consented to meet with [place] about presenting concerns.

## 2017-10-02 NOTE — Progress Notes (Signed)
Subjective:  Leslie Skinner is a 22 y.o. G1P0 at [redacted]w[redacted]d being seen today for ongoing prenatal care.  She is currently monitored for the following issues for this high-risk pregnancy and has Chronic hypertension during pregnancy, antepartum; GERD (gastroesophageal reflux disease); Migraine; Scoliosis; Obesity, Class III, BMI 40-49.9 (morbid obesity) ([place]); Anxiety state, unspecified; Pre-diabetes; and Supervision of high risk pregnancy, antepartum on their problem list.  Patient reports general discomforts of pregnancy.  Contractions: Not present. Vag. Bleeding: None.  Movement: Present. Denies leaking of fluid.   The following portions of the patient's history were reviewed and updated as appropriate: allergies, current medications, past family history, past medical history, past social history, past surgical history and problem list. Problem list updated.  Objective:   Vitals:   10/02/17 1457  BP: (!) 138/57  Pulse: 98  Weight: 248 lb 11.2 oz (112.8 kg)    Fetal Status: Fetal Heart Rate (bpm): NST   Movement: Present     General:  Alert, oriented and cooperative. Patient is in no acute distress.  Skin: Skin is warm and dry. No rash noted.   Cardiovascular: Normal heart rate noted  Respiratory: Normal respiratory effort, no problems with respiration noted  Abdomen: Soft, gravid, appropriate for gestational age. Pain/Pressure: Present     Pelvic:  Cervical exam performed        Extremities: Normal range of motion.     Mental Status: Normal mood and affect. Normal behavior. Normal judgment and thought content.   Urinalysis: Urine Protein: Negative Urine Glucose: Negative  Assessment and Plan:  Pregnancy: G1P0 at [redacted]w[redacted]d  1. Supervision of high risk pregnancy, antepartum Labor precautions - GC/Chlamydia probe amp ([place])not at [facility] - Strep Gp B NAA  2. Chronic hypertension during pregnancy, antepartum BP stable Continue with Labetalol and BASA BPP/10/10 today Growth scan  on 10/17/17  3. Diet controlled gestational diabetes mellitus (GDM) in third trimester Did not bring CBG's today Reports in goal range Growth scan 10/17/17  Preterm labor symptoms and general obstetric precautions including but not limited to vaginal bleeding, contractions, leaking of fluid and fetal movement were reviewed in detail with the patient. Please refer to After Visit Summary for other counseling recommendations.  Return in about 1 week (around 10/09/2017) for OB visit.   Chancy Milroy, MD

## 2017-10-02 NOTE — Progress Notes (Signed)

## 2017-10-02 NOTE — Patient Instructions (Signed)
Third Trimester of Pregnancy The third trimester is from week 28 through week 40 (months 7 through 9). The third trimester is a time when the unborn baby (fetus) is growing rapidly. At the end of the ninth month, the fetus is about 20 inches in length and weighs 6-10 pounds. Body changes during your third trimester Your body will continue to go through many changes during pregnancy. The changes vary from woman to woman. During the third trimester:  Your weight will continue to increase. You can expect to gain 25-35 pounds (11-16 kg) by the end of the pregnancy.  You may begin to get stretch marks on your hips, abdomen, and breasts.  You may urinate more often because the fetus is moving lower into your pelvis and pressing on your bladder.  You may develop or continue to have heartburn. This is caused by increased hormones that slow down muscles in the digestive tract.  You may develop or continue to have constipation because increased hormones slow digestion and cause the muscles that push waste through your intestines to relax.  You may develop hemorrhoids. These are swollen veins (varicose veins) in the rectum that can itch or be painful.  You may develop swollen, bulging veins (varicose veins) in your legs.  You may have increased body aches in the pelvis, back, or thighs. This is due to weight gain and increased hormones that are relaxing your joints.  You may have changes in your hair. These can include thickening of your hair, rapid growth, and changes in texture. Some women also have hair loss during or after pregnancy, or hair that feels dry or thin. Your hair will most likely return to normal after your baby is born.  Your breasts will continue to grow and they will continue to become tender. A yellow fluid (colostrum) may leak from your breasts. This is the first milk you are producing for your baby.  Your belly button may stick out.  You may notice more swelling in your hands,  face, or ankles.  You may have increased tingling or numbness in your hands, arms, and legs. The skin on your belly may also feel numb.  You may feel short of breath because of your expanding uterus.  You may have more problems sleeping. This can be caused by the size of your belly, increased need to urinate, and an increase in your body's metabolism.  You may notice the fetus "dropping," or moving lower in your abdomen (lightening).  You may have increased vaginal discharge.  You may notice your joints feel loose and you may have pain around your pelvic bone.  What to expect at prenatal visits You will have prenatal exams every 2 weeks until week 36. Then you will have weekly prenatal exams. During a routine prenatal visit:  You will be weighed to make sure you and the baby are growing normally.  Your blood pressure will be taken.  Your abdomen will be measured to track your baby's growth.  The fetal heartbeat will be listened to.  Any test results from the previous visit will be discussed.  You may have a cervical check near your due date to see if your cervix has softened or thinned (effaced).  You will be tested for Group B streptococcus. This happens between 35 and 37 weeks.  Your health care provider may ask you:  What your birth plan is.  How you are feeling.  If you are feeling the baby move.  If you have had   any abnormal symptoms, such as leaking fluid, bleeding, severe headaches, or abdominal cramping.  If you are using any tobacco products, including cigarettes, chewing tobacco, and electronic cigarettes.  If you have any questions.  Other tests or screenings that may be performed during your third trimester include:  Blood tests that check for low iron levels (anemia).  Fetal testing to check the health, activity level, and growth of the fetus. Testing is done if you have certain medical conditions or if there are problems during the  pregnancy.  Nonstress test (NST). This test checks the health of your baby to make sure there are no signs of problems, such as the baby not getting enough oxygen. During this test, a belt is placed around your belly. The baby is made to move, and its heart rate is monitored during movement.  What is false labor? False labor is a condition in which you feel small, irregular tightenings of the muscles in the womb (contractions) that usually go away with rest, changing position, or drinking water. These are called Braxton Hicks contractions. Contractions may last for hours, days, or even weeks before true labor sets in. If contractions come at regular intervals, become more frequent, increase in intensity, or become painful, you should see your health care provider. What are the signs of labor?  Abdominal cramps.  Regular contractions that start at 10 minutes apart and become stronger and more frequent with time.  Contractions that start on the top of the uterus and spread down to the lower abdomen and back.  Increased pelvic pressure and dull back pain.  A watery or bloody mucus discharge that comes from the vagina.  Leaking of amniotic fluid. This is also known as your "water breaking." It could be a slow trickle or a gush. Let your health care provider know if it has a color or strange odor. If you have any of these signs, call your health care provider right away, even if it is before your due date. Follow these instructions at home: Medicines  Follow your health care provider's instructions regarding medicine use. Specific medicines may be either safe or unsafe to take during pregnancy.  Take a prenatal vitamin that contains at least 600 micrograms (mcg) of folic acid.  If you develop constipation, try taking a stool softener if your health care provider approves. Eating and drinking  Eat a balanced diet that includes fresh fruits and vegetables, whole grains, good sources of protein  such as meat, eggs, or tofu, and low-fat dairy. Your health care provider will help you determine the amount of weight gain that is right for you.  Avoid raw meat and uncooked cheese. These carry germs that can cause birth defects in the baby.  If you have low calcium intake from food, talk to your health care provider about whether you should take a daily calcium supplement.  Eat four or five small meals rather than three large meals a day.  Limit foods that are high in fat and processed sugars, such as fried and sweet foods.  To prevent constipation: ? Drink enough fluid to keep your urine clear or pale yellow. ? Eat foods that are high in fiber, such as fresh fruits and vegetables, whole grains, and beans. Activity  Exercise only as directed by your health care provider. Most women can continue their usual exercise routine during pregnancy. Try to exercise for 30 minutes at least 5 days a week. Stop exercising if you experience uterine contractions.  Avoid heavy   lifting.  Do not exercise in extreme heat or humidity, or at high altitudes.  Wear low-heel, comfortable shoes.  Practice good posture.  You may continue to have sex unless your health care provider tells you otherwise. Relieving pain and discomfort  Take frequent breaks and rest with your legs elevated if you have leg cramps or low back pain.  Take warm sitz baths to soothe any pain or discomfort caused by hemorrhoids. Use hemorrhoid cream if your health care provider approves.  Wear a good support bra to prevent discomfort from breast tenderness.  If you develop varicose veins: ? Wear support pantyhose or compression stockings as told by your healthcare provider. ? Elevate your feet for 15 minutes, 3-4 times a day. Prenatal care  Write down your questions. Take them to your prenatal visits.  Keep all your prenatal visits as told by your health care provider. This is important. Safety  Wear your seat belt at  all times when driving.  Make a list of emergency phone numbers, including numbers for family, friends, the hospital, and police and fire departments. General instructions  Avoid cat litter boxes and soil used by cats. These carry germs that can cause birth defects in the baby. If you have a cat, ask someone to clean the litter box for you.  Do not travel far distances unless it is absolutely necessary and only with the approval of your health care provider.  Do not use hot tubs, steam rooms, or saunas.  Do not drink alcohol.  Do not use any products that contain nicotine or tobacco, such as cigarettes and e-cigarettes. If you need help quitting, ask your health care provider.  Do not use any medicinal herbs or unprescribed drugs. These chemicals affect the formation and growth of the baby.  Do not douche or use tampons or scented sanitary pads.  Do not cross your legs for long periods of time.  To prepare for the arrival of your baby: ? Take prenatal classes to understand, practice, and ask questions about labor and delivery. ? Make a trial run to the hospital. ? Visit the hospital and tour the maternity area. ? Arrange for maternity or paternity leave through employers. ? Arrange for family and friends to take care of pets while you are in the hospital. ? Purchase a rear-facing car seat and make sure you know how to install it in your car. ? Pack your hospital bag. ? Prepare the baby's nursery. Make sure to remove all pillows and stuffed animals from the baby's crib to prevent suffocation.  Visit your dentist if you have not gone during your pregnancy. Use a soft toothbrush to brush your teeth and be gentle when you floss. Contact a health care provider if:  You are unsure if you are in labor or if your water has broken.  You become dizzy.  You have mild pelvic cramps, pelvic pressure, or nagging pain in your abdominal area.  You have lower back pain.  You have persistent  nausea, vomiting, or diarrhea.  You have an unusual or bad smelling vaginal discharge.  You have pain when you urinate. Get help right away if:  Your water breaks before 37 weeks.  You have regular contractions less than 5 minutes apart before 37 weeks.  You have a fever.  You are leaking fluid from your vagina.  You have spotting or bleeding from your vagina.  You have severe abdominal pain or cramping.  You have rapid weight loss or weight gain.    You have shortness of breath with chest pain.  You notice sudden or extreme swelling of your face, hands, ankles, feet, or legs.  Your baby makes fewer than 10 movements in 2 hours.  You have severe headaches that do not go away when you take medicine.  You have vision changes. Summary  The third trimester is from week 28 through week 40, months 7 through 9. The third trimester is a time when the unborn baby (fetus) is growing rapidly.  During the third trimester, your discomfort may increase as you and your baby continue to gain weight. You may have abdominal, leg, and back pain, sleeping problems, and an increased need to urinate.  During the third trimester your breasts will keep growing and they will continue to become tender. A yellow fluid (colostrum) may leak from your breasts. This is the first milk you are producing for your baby.  False labor is a condition in which you feel small, irregular tightenings of the muscles in the womb (contractions) that eventually go away. These are called Braxton Hicks contractions. Contractions may last for hours, days, or even weeks before true labor sets in.  Signs of labor can include: abdominal cramps; regular contractions that start at 10 minutes apart and become stronger and more frequent with time; watery or bloody mucus discharge that comes from the vagina; increased pelvic pressure and dull back pain; and leaking of amniotic fluid. This information is not intended to replace advice  given to you by your health care provider. Make sure you discuss any questions you have with your health care provider. Document Released: 02/20/2001 Document Revised: 08/04/2015 Document Reviewed: 04/29/2012 [employer]vier Interactive Patient Education  2017 [employer]  

## 2017-10-02 NOTE — BH Specialist Note (Signed)
Integrated Behavioral Health Initial Visit  MRN: [id number] Name: Leslie Skinner  Number of [place] Clinician visits:: 1/6 Session Start time: 4:15  Session End time: 4:50 Total time: 35 minutes  Type of Service: [place] Interpretor:No. Interpretor Name and Language: n/a   Warm Hand Off Completed.       SUBJECTIVE: [name] is a 22 y.o. female accompanied by n/a Patient was referred by Arlina Robes, MD for depression and anxiety. Patient reports the following symptoms/concerns: Pt states her primary concern today is lack of interest in activities,depression, isolation, fatigue, irritability, lack of quality sleep, nightmares causing anxiety and fear, worry, and increase in back pain(scoliosis) as pregnancy progresses; pt has used deep breathing in the past to cope with overwhelming emotions, and prefers non-pharmacologic methods to cope during pregnancy.    Duration of problem: Current pregnancy; Severity of problem: moderate  OBJECTIVE: Mood: Anxious, Depressed and Irritable and Affect: Tearful Risk of harm to self or others: No plan to harm self or others  LIFE CONTEXT: Family and Social: Supportive family and friends School/Work: Working part-time at [employer]: Recognizing a need for greater self-care Life Changes: Current pregnancy   GOALS ADDRESSED: Patient will: 1. Reduce symptoms of: agitation, anxiety, depression and stress 2. Increase knowledge and/or ability of: self-management skills  3. Demonstrate ability to: Increase healthy adjustment to current life circumstances  INTERVENTIONS: Interventions utilized: Mindfulness or Psychologist, educational and Psychoeducation and/or Health Education  Standardized Assessments completed: GAD-7 and PHQ 9  ASSESSMENT: Patient currently experiencing Adjustment disorder with mixed anxious and depressed mood.   Patient may benefit from  psychoeducation and brief therapeutic interventions regarding coping with symptoms of depression and anxiety .  PLAN: 1. Follow up with behavioral health clinician on : One week 2. Behavioral recommendations:  -CALM relaxation breathing exercise twice daily (morning and at bedtime) -Begin using sleep app tonight (including fading sleep light, rather than full night light) -Read educational materials regarding coping with symptoms of anxiety and depression -Consider [facility] walk-in clinic, before next medical appointment, to establish care before birth 3. Referral(s): [place] (In Clinic) and [place] (LME/Outside Clinic) 4. "From scale of 1-10, how likely are you to follow plan?": 9  [name], LCSW  Depression screen [facility] 2/9 10/02/2017 09/18/2017 09/04/2017 08/26/2017 08/09/2017  Decreased Interest 2 2 1 1 1   Down, Depressed, Hopeless 2 1 - 0 0  PHQ - 2 Score 4 3 1 1 1   Altered sleeping 2 3 1 1 1   Tired, decreased energy 2 1 1 2 1   Change in appetite 2 1 1 1 1   Feeling bad or failure about yourself  0 0 0 0 0  Trouble concentrating 0 0 0 0 0  Moving slowly or fidgety/restless 0 0 0 0 0  Suicidal thoughts 0 0 0 0 0  PHQ-9 Score 10 8 4 5 4   Difficult doing work/chores - - Somewhat difficult - -  Some recent data might be hidden   GAD 7 : Generalized Anxiety Score 10/02/2017 09/18/2017 09/04/2017 08/26/2017  Nervous, Anxious, on Edge 1 2 0 0  Control/stop worrying 1 1 0 0  Worry too much - different things 1 2 0 0  Trouble relaxing 1 1 0 0  Restless 0 0 0 0  Easily annoyed or irritable 3 2 3 2   Afraid - awful might happen 0 0 0 0  Total GAD 7 Score 7  8 3 2   Anxiety Difficulty - - Somewhat difficult -

## 2017-10-04 LAB — STREP GP B NAA: STREP GROUP B AG: POSITIVE — AB

## 2017-10-04 LAB — GC/CHLAMYDIA PROBE AMP (~~LOC~~) NOT AT ARMC
CHLAMYDIA, DNA PROBE: NEGATIVE
NEISSERIA GONORRHEA: NEGATIVE

## 2017-10-07 ENCOUNTER — Encounter (HOSPITAL_COMMUNITY): Payer: Self-pay

## 2017-10-07 ENCOUNTER — Other Ambulatory Visit: Payer: Self-pay

## 2017-10-07 ENCOUNTER — Inpatient Hospital Stay (HOSPITAL_COMMUNITY)
Admission: AD | Admit: 2017-10-07 | Discharge: 2017-10-11 | DRG: 832 | Disposition: A | Discharge status: Home / Self Care | Payer: Medicaid Other | Attending: Family Medicine | Admitting: Family Medicine

## 2017-10-07 DIAGNOSIS — O288 Other abnormal findings on antenatal screening of mother: Secondary | ICD-10-CM

## 2017-10-07 DIAGNOSIS — Z7722 Contact with and (suspected) exposure to environmental tobacco smoke (acute) (chronic): Secondary | ICD-10-CM | POA: Diagnosis present

## 2017-10-07 DIAGNOSIS — Z885 Allergy status to narcotic agent status: Secondary | ICD-10-CM

## 2017-10-07 DIAGNOSIS — O24410 Gestational diabetes mellitus in pregnancy, diet controlled: Secondary | ICD-10-CM | POA: Diagnosis present

## 2017-10-07 DIAGNOSIS — O2303 Infections of kidney in pregnancy, third trimester: Secondary | ICD-10-CM | POA: Diagnosis not present

## 2017-10-07 DIAGNOSIS — O99613 Diseases of the digestive system complicating pregnancy, third trimester: Secondary | ICD-10-CM | POA: Diagnosis present

## 2017-10-07 DIAGNOSIS — O99820 Streptococcus B carrier state complicating pregnancy: Secondary | ICD-10-CM | POA: Diagnosis present

## 2017-10-07 DIAGNOSIS — O10919 Unspecified pre-existing hypertension complicating pregnancy, unspecified trimester: Secondary | ICD-10-CM | POA: Diagnosis present

## 2017-10-07 DIAGNOSIS — K219 Gastro-esophageal reflux disease without esophagitis: Secondary | ICD-10-CM | POA: Diagnosis present

## 2017-10-07 DIAGNOSIS — O99213 Obesity complicating pregnancy, third trimester: Secondary | ICD-10-CM | POA: Diagnosis present

## 2017-10-07 DIAGNOSIS — O24113 Pre-existing diabetes mellitus, type 2, in pregnancy, third trimester: Secondary | ICD-10-CM | POA: Diagnosis not present

## 2017-10-07 DIAGNOSIS — O10013 Pre-existing essential hypertension complicating pregnancy, third trimester: Secondary | ICD-10-CM | POA: Diagnosis present

## 2017-10-07 DIAGNOSIS — B9620 Unspecified Escherichia coli [E. coli] as the cause of diseases classified elsewhere: Secondary | ICD-10-CM | POA: Diagnosis present

## 2017-10-07 DIAGNOSIS — Z7982 Long term (current) use of aspirin: Secondary | ICD-10-CM | POA: Diagnosis not present

## 2017-10-07 DIAGNOSIS — Z3A36 36 weeks gestation of pregnancy: Secondary | ICD-10-CM

## 2017-10-07 DIAGNOSIS — O2300 Infections of kidney in pregnancy, unspecified trimester: Secondary | ICD-10-CM | POA: Diagnosis present

## 2017-10-07 DIAGNOSIS — M549 Dorsalgia, unspecified: Secondary | ICD-10-CM | POA: Diagnosis not present

## 2017-10-07 DIAGNOSIS — Z3A37 37 weeks gestation of pregnancy: Secondary | ICD-10-CM | POA: Diagnosis not present

## 2017-10-07 DIAGNOSIS — E669 Obesity, unspecified: Secondary | ICD-10-CM | POA: Diagnosis present

## 2017-10-07 LAB — COMPREHENSIVE METABOLIC PANEL
ALT: 22 U/L (ref 0–44)
AST: 33 U/L (ref 15–41)
Albumin: 2.7 g/dL — ABNORMAL LOW (ref 3.5–5.0)
Alkaline Phosphatase: 209 U/L — ABNORMAL HIGH (ref 38–126)
Anion gap: 12 (ref 5–15)
BUN: 8 mg/dL (ref 6–20)
CO2: 21 mmol/L — ABNORMAL LOW (ref 22–32)
Calcium: 8.3 mg/dL — ABNORMAL LOW (ref 8.9–10.3)
Chloride: 100 mmol/L (ref 98–111)
Creatinine, Ser: 0.9 mg/dL (ref 0.44–1.00)
GFR calc Af Amer: >60 mL/min (ref >60)
GFR calc non Af Amer: >60 mL/min (ref >60)
Glucose, Bld: 91 mg/dL (ref 70–99)
Potassium: 3.9 mmol/L (ref 3.5–5.1)
Sodium: 133 mmol/L — ABNORMAL LOW (ref 135–145)
Total Bilirubin: 1.2 mg/dL (ref 0.3–1.2)
Total Protein: 6.4 g/dL — ABNORMAL LOW (ref 6.5–8.1)

## 2017-10-07 LAB — CBC
HCT: 31.5 % — ABNORMAL LOW (ref 36.0–46.0)
Hemoglobin: 10.7 g/dL — ABNORMAL LOW (ref 12.0–15.0)
MCH: 27 pg (ref 26.0–34.0)
MCHC: 34 g/dL (ref 30.0–36.0)
MCV: 79.5 fL (ref 78.0–100.0)
Platelets: 189 10*3/uL (ref 150–400)
RBC: 3.96 MIL/uL (ref 3.87–5.11)
RDW: 15.1 % (ref 11.5–15.5)
WBC: 16.3 10*3/uL — ABNORMAL HIGH (ref 4.0–10.5)

## 2017-10-07 LAB — URINALYSIS, ROUTINE W REFLEX MICROSCOPIC
Bilirubin Urine: NEGATIVE
Glucose, UA: NEGATIVE mg/dL
Hgb urine dipstick: NEGATIVE
Ketones, ur: 5 mg/dL — AB
Nitrite: POSITIVE — AB
Protein, ur: 30 mg/dL — AB
Specific Gravity, Urine: 1.014 (ref 1.005–1.030)
pH: 6 (ref 5.0–8.0)

## 2017-10-07 LAB — GLUCOSE, CAPILLARY: GLUCOSE-CAPILLARY: 87 mg/dL (ref 70–99)

## 2017-10-07 MED ORDER — CALCIUM CARBONATE ANTACID 500 MG PO CHEW
2.0000 | CHEWABLE_TABLET | ORAL | Status: DC | PRN
  Administered 2017-10-10: 400 mg via ORAL
  Filled 2017-10-07: qty 2

## 2017-10-07 MED ORDER — LACTATED RINGERS IV BOLUS
1000.0000 mL | Freq: Once | INTRAVENOUS | Status: AC
  Administered 2017-10-07: 1000 mL via INTRAVENOUS

## 2017-10-07 MED ORDER — SODIUM CHLORIDE 0.9 % IV SOLN
2.0000 g | INTRAVENOUS | Status: DC
  Administered 2017-10-07: 2 g via INTRAVENOUS
  Filled 2017-10-07 (×2): qty 20

## 2017-10-07 MED ORDER — LACTATED RINGERS IV BOLUS: 1000.0000 mL | Freq: Once | INTRAVENOUS | Status: DC

## 2017-10-07 MED ORDER — DOCUSATE SODIUM 100 MG PO CAPS: 100.0000 mg | ORAL_CAPSULE | Freq: Two times a day (BID) | ORAL | Status: DC | PRN

## 2017-10-07 MED ORDER — LABETALOL HCL 100 MG PO TABS
100.0000 mg | ORAL_TABLET | Freq: Two times a day (BID) | ORAL | Status: DC
  Administered 2017-10-08 – 2017-10-11 (×8): 100 mg via ORAL
  Filled 2017-10-07 (×8): qty 1

## 2017-10-07 MED ORDER — SODIUM CHLORIDE 0.9 % IV SOLN
INTRAVENOUS | Status: AC
  Administered 2017-10-07 – 2017-10-08 (×3): via INTRAVENOUS

## 2017-10-07 MED ORDER — PRENATAL MULTIVITAMIN CH
1.0000 | ORAL_TABLET | Freq: Every day | ORAL | Status: DC
  Administered 2017-10-09 – 2017-10-10 (×2): 1 via ORAL
  Filled 2017-10-07 (×2): qty 1

## 2017-10-07 MED ORDER — ENOXAPARIN SODIUM 60 MG/0.6ML ~~LOC~~ SOLN
60.0000 mg | SUBCUTANEOUS | Status: DC
  Administered 2017-10-08 – 2017-10-10 (×4): 60 mg via SUBCUTANEOUS
  Filled 2017-10-07 (×4): qty 0.6

## 2017-10-07 MED ORDER — ACETAMINOPHEN 500 MG PO TABS
1000.0000 mg | ORAL_TABLET | Freq: Once | ORAL | Status: AC
  Administered 2017-10-07: 1000 mg via ORAL
  Filled 2017-10-07: qty 2

## 2017-10-07 MED ORDER — ASPIRIN EC 81 MG PO TBEC
81.0000 mg | DELAYED_RELEASE_TABLET | Freq: Every day | ORAL | Status: DC
  Administered 2017-10-08 – 2017-10-11 (×4): 81 mg via ORAL
  Filled 2017-10-07 (×4): qty 1

## 2017-10-07 MED ORDER — ZOLPIDEM TARTRATE 5 MG PO TABS: 5.0000 mg | ORAL_TABLET | Freq: Every evening | ORAL | Status: DC | PRN

## 2017-10-07 MED ORDER — LACTATED RINGERS IV BOLUS: 1500.0000 mL | Freq: Once | INTRAVENOUS | Status: DC

## 2017-10-07 MED ORDER — HYDROMORPHONE HCL 2 MG PO TABS
2.0000 mg | ORAL_TABLET | Freq: Four times a day (QID) | ORAL | Status: DC | PRN
  Administered 2017-10-07 – 2017-10-10 (×4): 2 mg via ORAL
  Filled 2017-10-07 (×4): qty 1

## 2017-10-07 MED ORDER — ACETAMINOPHEN 325 MG PO TABS
650.0000 mg | ORAL_TABLET | ORAL | Status: DC | PRN
  Administered 2017-10-08 – 2017-10-09 (×6): 650 mg via ORAL
  Filled 2017-10-07 (×6): qty 2

## 2017-10-07 NOTE — MAU Note (Addendum)
Pt reports constant mid back pain x2 weeks. States it "hurts so bad she wants to throw up." Has not vomited today. States she took Tylenol-last took around 2pm-has not helped. States she feels like she has a fever but has not checked her temperature. Rates 10/10. Pt denies vaginal bleeding or LOF. Reports good fetal movement.
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
## 2017-10-08 ENCOUNTER — Inpatient Hospital Stay (HOSPITAL_COMMUNITY): Payer: Medicaid Other

## 2017-10-08 ENCOUNTER — Ambulatory Visit (HOSPITAL_COMMUNITY): Payer: Medicaid Other

## 2017-10-08 DIAGNOSIS — O24410 Gestational diabetes mellitus in pregnancy, diet controlled: Secondary | ICD-10-CM | POA: Diagnosis present

## 2017-10-08 LAB — CBC WITH DIFFERENTIAL/PLATELET
BASOS ABS: 0 10*3/uL (ref 0.0–0.1)
BASOS PCT: 0 %
Eosinophils Absolute: 0 10*3/uL (ref 0.0–0.7)
Eosinophils Relative: 0 %
HCT: 29.9 % — ABNORMAL LOW (ref 36.0–46.0)
Hemoglobin: 10.2 g/dL — ABNORMAL LOW (ref 12.0–15.0)
LYMPHS PCT: 13 %
Lymphs Abs: 2.6 10*3/uL (ref 0.7–4.0)
MCH: 27.1 pg (ref 26.0–34.0)
MCHC: 34.1 g/dL (ref 30.0–36.0)
MCV: 79.3 fL (ref 78.0–100.0)
MONO ABS: 1.1 10*3/uL — AB (ref 0.1–1.0)
Monocytes Relative: 6 %
NEUTROS ABS: 16.2 10*3/uL — AB (ref 1.7–7.7)
Neutrophils Relative %: 81 %
Platelets: 180 10*3/uL (ref 150–400)
RBC: 3.77 MIL/uL — AB (ref 3.87–5.11)
RDW: 15.5 % (ref 11.5–15.5)
WBC: 20 10*3/uL — AB (ref 4.0–10.5)

## 2017-10-08 LAB — GLUCOSE, CAPILLARY
GLUCOSE-CAPILLARY: 91 mg/dL (ref 70–99)
GLUCOSE-CAPILLARY: 99 mg/dL (ref 70–99)
Glucose-Capillary: 132 mg/dL — ABNORMAL HIGH (ref 70–99)

## 2017-10-08 MED ORDER — SODIUM CHLORIDE 0.9 % IV BOLUS
1000.0000 mL | Freq: Once | INTRAVENOUS | Status: AC
  Administered 2017-10-08: 1000 mL via INTRAVENOUS

## 2017-10-08 MED ORDER — PIPERACILLIN-TAZOBACTAM 3.375 G IVPB
3.3750 g | Freq: Three times a day (TID) | INTRAVENOUS | Status: DC
  Administered 2017-10-08 – 2017-10-11 (×9): 3.375 g via INTRAVENOUS
  Filled 2017-10-08 (×9): qty 50

## 2017-10-08 NOTE — BH Specialist Note (Deleted)
Integrated Behavioral Health Follow Up Visit  MRN: [id number] Name: DENISIA HARPOLE  Number of [place] Clinician visits: 2/6 Session Start time: ***  Session End time: *** Total time: {IBH Total Time:21014050}  Type of Service: [place] Interpretor:No. Interpretor Name and Language: n/a  SUBJECTIVE: BEULAH CAPOBIANCO is a 22 y.o. female accompanied by {Patient accompanied by:[phone]} Patient was referred by Arlina Robes, MD for depression and anxiety. Patient reports the following symptoms/concerns: Pt states her primary concern today is *** Duration of problem: Current pregnancy; Severity of problem: moderate ***  OBJECTIVE: Mood: {BHH MOOD:22306} and Affect: {BHH AFFECT:22307} Risk of harm to self or others: No plan to harm self or others  LIFE CONTEXT: Family and Social: Supportive family and friends *** School/Work: Works part-time at [employer]: *** Life Changes: Current pregnancy ***  GOALS ADDRESSED: Patient will: 1.  Reduce symptoms of: {IBH Symptoms:21014056}  2.  Increase knowledge and/or ability of: {IBH Patient Tools:21014057}  3.  Demonstrate ability to: {IBH Goals:21014053}  INTERVENTIONS: Interventions utilized:  {IBH Interventions:21014054} Standardized Assessments completed: GAD-7 and PHQ 9  ASSESSMENT: Patient currently experiencing Adjustment disorder with mixed anxious and depressed mood ***  Patient may benefit from continued psychoeducation and brief therapeutic interventions regarding coping with symptoms of depression and anxiety *** .  PLAN: 1. Follow up with behavioral health clinician on : *** 2. Behavioral recommendations:  - CALM/Sleep app/FamilyServiceswalk-in*** -*** 3. Referral(s): {IBH Referrals:21014055} 4. "From scale of 1-10, how likely are you to follow plan?": ***  Caroleen Hamman Fantashia Shupert, LCSW

## 2017-10-08 NOTE — Progress Notes (Signed)
Patient ID: Leslie Skinner, female   DOB: 19-Nov-1995, 22 y.o.   MRN: [id number]  FACULTY PRACTICE ANTEPARTUM NOTE  Leslie Skinner is a 22 y.o. G1P0 at [redacted]w[redacted]d  who is admitted for pyelonephritis.   Fetal presentation is unsure. Length of Stay:  1  Days  Subjective: Still having back pain, more midline than flank. No nausea.  Patient reports good fetal movement.   She reports no uterine contractions She reports no bleeding  She reports no loss of fluid per vagina.  Vitals:  Blood pressure 127/65, pulse (!) 133, temperature 99 F (37.2 C), temperature source Oral, resp. rate 18, height 5\' 2"  (1.575 m), weight 252 lb (114.3 kg), last menstrual period 01/23/2017, SpO2 99 %, unknown if currently breastfeeding. Physical Examination:  General appearance - alert, well appearing, and in no distress Chest - clear to auscultation, no wheezes, rales or rhonchi, symmetric air entry Heart - normal rate, regular rhythm, normal S1, S2, no murmurs, rubs, clicks or gallops Abdomen - soft, nontender, nondistended, no masses or organomegaly Musculoskeletal - midline back pain Fundal Height:  size equals dates Extremities: extremities normal, atraumatic, no cyanosis or edema and Homans sign is negative, no sign of DVT  Membranes:intact  Fetal Monitoring:  Baseline: 150 bpm, Variability: Good {> 6 bpm), Accelerations: Reactive and Decelerations: Absent  Labs:  Results for orders placed or performed during the hospital encounter of 10/07/17 (from the past 24 hour(s))  Urinalysis, Routine w reflex microscopic   Collection Time: 10/07/17  9:10 PM  Result Value Ref Range   Color, Urine YELLOW YELLOW   APPearance HAZY (A) CLEAR   Specific Gravity, Urine 1.014 1.005 - 1.030   pH 6.0 5.0 - 8.0   Glucose, UA NEGATIVE NEGATIVE mg/dL   Hgb urine dipstick NEGATIVE NEGATIVE   Bilirubin Urine NEGATIVE NEGATIVE   Ketones, ur 5 (A) NEGATIVE mg/dL   Protein, ur 30 (A) NEGATIVE mg/dL   Nitrite POSITIVE (A)  NEGATIVE   Leukocytes, UA TRACE (A) NEGATIVE   RBC / HPF 0-5 0 - 5 RBC/hpf   WBC, UA 11-20 0 - 5 WBC/hpf   Bacteria, UA MANY (A) NONE SEEN   Squamous Epithelial / LPF 0-5 0 - 5  CBC   Collection Time: 10/07/17  9:37 PM  Result Value Ref Range   WBC 16.3 (H) 4.0 - 10.5 K/uL   RBC 3.96 3.87 - 5.11 MIL/uL   Hemoglobin 10.7 (L) 12.0 - 15.0 g/dL   HCT 31.5 (L) 36.0 - 46.0 %   MCV 79.5 78.0 - 100.0 fL   MCH 27.0 26.0 - 34.0 pg   MCHC 34.0 30.0 - 36.0 g/dL   RDW 15.1 11.5 - 15.5 %   Platelets 189 150 - 400 K/uL  Comprehensive metabolic panel   Collection Time: 10/07/17  9:37 PM  Result Value Ref Range   Sodium 133 (L) 135 - 145 mmol/L   Potassium 3.9 3.5 - 5.1 mmol/L   Chloride 100 98 - 111 mmol/L   CO2 21 (L) 22 - 32 mmol/L   Glucose, Bld 91 70 - 99 mg/dL   BUN 8 6 - 20 mg/dL   Creatinine, Ser 0.90 0.44 - 1.00 mg/dL   Calcium 8.3 (L) 8.9 - 10.3 mg/dL   Total Protein 6.4 (L) 6.5 - 8.1 g/dL   Albumin 2.7 (L) 3.5 - 5.0 g/dL   AST 33 15 - 41 U/L   ALT 22 0 - 44 U/L   Alkaline Phosphatase 209 (H) 38 -  126 U/L   Total Bilirubin 1.2 0.3 - 1.2 mg/dL   GFR calc non Af Amer >60 >60 mL/min   GFR calc Af Amer >60 >60 mL/min   Anion gap 12 5 - 15  Glucose, capillary   Collection Time: 10/07/17 11:00 PM  Result Value Ref Range   Glucose-Capillary 87 70 - 99 mg/dL  Glucose, capillary   Collection Time: 10/08/17  5:37 AM  Result Value Ref Range   Glucose-Capillary 91 70 - 99 mg/dL   Comment 1 Notify RN   CBC with Differential/Platelet   Collection Time: 10/08/17  7:27 AM  Result Value Ref Range   WBC 20.0 (H) 4.0 - 10.5 K/uL   RBC 3.77 (L) 3.87 - 5.11 MIL/uL   Hemoglobin 10.2 (L) 12.0 - 15.0 g/dL   HCT 29.9 (L) 36.0 - 46.0 %   MCV 79.3 78.0 - 100.0 fL   MCH 27.1 26.0 - 34.0 pg   MCHC 34.1 30.0 - 36.0 g/dL   RDW 15.5 11.5 - 15.5 %   Platelets 180 150 - 400 K/uL   Neutrophils Relative % 81 %   Neutro Abs 16.2 (H) 1.7 - 7.7 K/uL   Lymphocytes Relative 13 %   Lymphs Abs 2.6 0.7 -  4.0 K/uL   Monocytes Relative 6 %   Monocytes Absolute 1.1 (H) 0.1 - 1.0 K/uL   Eosinophils Relative 0 %   Eosinophils Absolute 0.0 0.0 - 0.7 K/uL   Basophils Relative 0 %   Basophils Absolute 0.0 0.0 - 0.1 K/uL  Glucose, capillary   Collection Time: 10/08/17  8:35 AM  Result Value Ref Range   Glucose-Capillary 132 (H) 70 - 99 mg/dL    Imaging Studies:       Medications:  Scheduled . aspirin EC  81 mg Oral Daily  . enoxaparin (LOVENOX) injection  60 mg Subcutaneous Q24H  . labetalol  100 mg Oral BID  . prenatal multivitamin  1 tablet Oral Q1200   I have reviewed the patient's current medications.  ASSESSMENT: Active Problems:   Chronic hypertension during pregnancy, antepartum   GERD (gastroesophageal reflux disease)   Pyelonephritis affecting pregnancy   GDM, class A1   PLAN: 1. Pylonephritis  UCx pending  WBC increased  On rocephin 2g q24  Waiting on renal US to r/o abscess  2. GDM A1  Continue CBGs  3. CHTN  Continue ASA 81mg   On labetalol 100mg  BID  Growth [place] on 8/8  4. GERD  Continue routine antenatal care.   Truett Mainland, DO 10/08/2017,10:33 AM

## 2017-10-09 ENCOUNTER — Encounter: Payer: Medicaid Other | Admitting: Obstetrics & Gynecology

## 2017-10-09 ENCOUNTER — Other Ambulatory Visit: Payer: Medicaid Other

## 2017-10-09 LAB — GLUCOSE, CAPILLARY
GLUCOSE-CAPILLARY: 103 mg/dL — AB (ref 70–99)
GLUCOSE-CAPILLARY: 116 mg/dL — AB (ref 70–99)

## 2017-10-09 LAB — CBC
HCT: 29.6 % — ABNORMAL LOW (ref 36.0–46.0)
Hemoglobin: 10 g/dL — ABNORMAL LOW (ref 12.0–15.0)
MCH: 26.8 pg (ref 26.0–34.0)
MCHC: 33.8 g/dL (ref 30.0–36.0)
MCV: 79.4 fL (ref 78.0–100.0)
PLATELETS: 166 10*3/uL (ref 150–400)
RBC: 3.73 MIL/uL — AB (ref 3.87–5.11)
RDW: 15.6 % — ABNORMAL HIGH (ref 11.5–15.5)
WBC: 17 10*3/uL — ABNORMAL HIGH (ref 4.0–10.5)

## 2017-10-09 MED ORDER — ONDANSETRON HCL 4 MG/2ML IJ SOLN: 4.0000 mg | Freq: Four times a day (QID) | INTRAMUSCULAR | Status: DC | PRN

## 2017-10-09 NOTE — Plan of Care (Signed)
  Problem: Pain Managment: Goal: General experience of comfort will improve Outcome: Progressing   Problem: Safety: Goal: Ability to remain free from injury will improve Outcome: Progressing   Problem: Education: Goal: Knowledge of disease or condition will improve Outcome: Progressing   Problem: Pain Management: Goal: Relief or control of pain will improve Outcome: Progressing

## 2017-10-09 NOTE — Progress Notes (Signed)
Patient ID: Leslie Skinner, female   DOB: 13-Dec-1995, 22 y.o.   MRN: [id number]  FACULTY PRACTICE ANTEPARTUM NOTE  Leslie Skinner is a 22 y.o. G1P0 at [redacted]w[redacted]d  who is admitted for pyelonephritis.   Fetal presentation is cephalic. Length of Stay:  2  Days  Subjective: Patient reports mild improvement in back pain. Has some nausea. Patient reports good fetal movement.   She reports no uterine contractions She reports no bleeding  She reports no loss of fluid per vagina.  Vitals:  Blood pressure 117/80, pulse (!) 108, temperature 99.3 F (37.4 C), temperature source Oral, resp. rate 17, height 5\' 2"  (1.575 m), weight 252 lb (114.3 kg), last menstrual period 01/23/2017, SpO2 100 %, unknown if currently breastfeeding. Physical Examination:  General appearance - alert, well appearing, and in no distress and oriented to person, place, and time Chest - clear to auscultation, no wheezes, rales or rhonchi, symmetric air entry Heart - normal rate, regular rhythm, normal S1, S2, no murmurs, rubs, clicks or gallops Abdomen - soft, nontender, nondistended, no masses or organomegaly Fundal Height:  size equals dates Extremities: extremities normal, atraumatic, no cyanosis or edema and Homans sign is negative, no sign of DVT  Membranes:intact  Fetal Monitoring:  Baseline: 155 bpm, Variability: Good {> 6 bpm), Accelerations: Reactive and Decelerations: Absent  Labs:  Results for orders placed or performed during the hospital encounter of 10/07/17 (from the past 24 hour(s))  Culture, blood (routine x 2)   Collection Time: 10/08/17  5:10 PM  Result Value Ref Range   Specimen Description      BLOOD LEFT ARM Performed at [facility], [address] [place], [place] [address]    Special Requests      BOTTLES DRAWN AEROBIC AND ANAEROBIC Blood Culture adequate volume   Culture PENDING    Report Status PENDING   Culture, blood (routine x 2)   Collection Time: 10/08/17  5:10 PM  Result  Value Ref Range   Specimen Description      BLOOD LEFT ARM Performed at [facility], [address], [place], [place] [address]    Special Requests      BOTTLES DRAWN AEROBIC AND ANAEROBIC Blood Culture adequate volume   Culture PENDING    Report Status PENDING   Glucose, capillary   Collection Time: 10/08/17  8:44 PM  Result Value Ref Range   Glucose-Capillary 99 70 - 99 mg/dL   Comment 1 Notify RN   Glucose, capillary   Collection Time: 10/09/17  5:09 AM  Result Value Ref Range   Glucose-Capillary 103 (H) 70 - 99 mg/dL   Comment 1 Notify RN     Imaging Studies:       Medications:  Scheduled . aspirin EC  81 mg Oral Daily  . enoxaparin (LOVENOX) injection  60 mg Subcutaneous Q24H  . labetalol  100 mg Oral BID  . prenatal multivitamin  1 tablet Oral Q1200   I have reviewed the patient's current medications.  ASSESSMENT: Active Problems:   Chronic hypertension during pregnancy, antepartum   GERD (gastroesophageal reflux disease)   Pyelonephritis affecting pregnancy   GDM, class A1   PLAN: 1. Pylonephritis  Bl Cx pending  UCx > GNR  Morning CBC pending  Changed to Zosyn for continued fevers.   No abscess on renal US  2. GDM A1  Continue CBGs  3. CHTN  Continue ASA 81mg   On labetalol 100mg  BID  Growth [place] on 8/8  4. GERD  Continue routine antenatal care.   Truett Mainland, DO 10/09/2017,9:53 AM

## 2017-10-10 ENCOUNTER — Inpatient Hospital Stay (HOSPITAL_BASED_OUTPATIENT_CLINIC_OR_DEPARTMENT_OTHER): Payer: Medicaid Other

## 2017-10-10 DIAGNOSIS — O99213 Obesity complicating pregnancy, third trimester: Secondary | ICD-10-CM

## 2017-10-10 DIAGNOSIS — O288 Other abnormal findings on antenatal screening of mother: Secondary | ICD-10-CM

## 2017-10-10 DIAGNOSIS — Z3A37 37 weeks gestation of pregnancy: Secondary | ICD-10-CM

## 2017-10-10 DIAGNOSIS — O24113 Pre-existing diabetes mellitus, type 2, in pregnancy, third trimester: Secondary | ICD-10-CM

## 2017-10-10 DIAGNOSIS — O10013 Pre-existing essential hypertension complicating pregnancy, third trimester: Secondary | ICD-10-CM

## 2017-10-10 LAB — GLUCOSE, CAPILLARY
Glucose-Capillary: 121 mg/dL — ABNORMAL HIGH (ref 70–99)
Glucose-Capillary: 74 mg/dL (ref 70–99)
Glucose-Capillary: 88 mg/dL (ref 70–99)
Glucose-Capillary: 113 mg/dL — ABNORMAL HIGH (ref 70–99)

## 2017-10-10 LAB — CULTURE, OB URINE: Culture: >=100000 — AB

## 2017-10-10 LAB — CBC
HCT: 29.6 % — ABNORMAL LOW (ref 36.0–46.0)
Hemoglobin: 9.9 g/dL — ABNORMAL LOW (ref 12.0–15.0)
MCH: 26.5 pg (ref 26.0–34.0)
MCHC: 33.4 g/dL (ref 30.0–36.0)
MCV: 79.4 fL (ref 78.0–100.0)
Platelets: 174 10*3/uL (ref 150–400)
RBC: 3.73 MIL/uL — ABNORMAL LOW (ref 3.87–5.11)
RDW: 15.7 % — AB (ref 11.5–15.5)
WBC: 12 10*3/uL — ABNORMAL HIGH (ref 4.0–10.5)

## 2017-10-10 MED ORDER — FAMOTIDINE 40 MG/5ML PO SUSR: 20.0000 mg | Freq: Two times a day (BID) | ORAL | Status: DC

## 2017-10-10 MED ORDER — LACTATED RINGERS IV BOLUS
500.0000 mL | Freq: Once | INTRAVENOUS | Status: AC
  Administered 2017-10-10: 500 mL via INTRAVENOUS

## 2017-10-10 MED ORDER — CYCLOBENZAPRINE HCL 10 MG PO TABS
10.0000 mg | ORAL_TABLET | Freq: Three times a day (TID) | ORAL | Status: DC | PRN
  Administered 2017-10-10: 10 mg via ORAL
  Filled 2017-10-10 (×2): qty 1

## 2017-10-10 MED ORDER — FAMOTIDINE 20 MG PO TABS
20.0000 mg | ORAL_TABLET | Freq: Two times a day (BID) | ORAL | Status: DC
  Administered 2017-10-10 – 2017-10-11 (×3): 20 mg via ORAL
  Filled 2017-10-10 (×3): qty 1

## 2017-10-10 NOTE — Progress Notes (Signed)
Patient ID: Leslie Skinner, female   DOB: [birth year], 22 y.o.   MRN: [id number]  FACULTY PRACTICE ANTEPARTUM NOTE  Leslie Skinner is a 22 y.o. G1P0 at [redacted]w[redacted]d  who is admitted for pyelonephritis.   Fetal presentation is cephalic. Length of Stay:  3  Days  Subjective: Patient reports back pain that starts in right flank and radiates up shoulder. Had some percocet, which improved the pain, but returned.  Patient reports good fetal movement.   She reports no uterine contractions She reports no bleeding  She reports no loss of fluid per vagina.  Vitals:  Blood pressure 125/69, pulse (!) 112, temperature 98.2 F (36.8 C), temperature source Oral, resp. rate 18, height 5\' 2"  (1.575 m), weight 252 lb (114.3 kg), last menstrual period 01/23/2017, SpO2 96 %, unknown if currently breastfeeding. Physical Examination:  General appearance - alert, well appearing, and in no distress and oriented to person, place, and time Chest - clear to auscultation, Good respiratory effort Heart - normal rate, regular rhythm,  Abdomen - soft, nontender, nondistended, no masses or organomegaly MSK: right back spasm. Fundal Height:  size equals dates Extremities: extremities normal, atraumatic, no cyanosis or edema and Homans sign is negative, no sign of DVT  Membranes:intact  Fetal Monitoring: NST x 2 reviewed.  Baseline: 150-155 bpm, Variability: Good {> 6 bpm), Accelerations: Reactive and Decelerations: Absent  Labs:  Results for orders placed or performed during the hospital encounter of 10/07/17 (from the past 24 hour(s))  Glucose, capillary   Collection Time: 10/09/17  4:03 PM  Result Value Ref Range   Glucose-Capillary 116 (H) 70 - 99 mg/dL  Glucose, capillary   Collection Time: 10/10/17  1:00 AM  Result Value Ref Range   Glucose-Capillary 121 (H) 70 - 99 mg/dL  CBC   Collection Time: 10/10/17  5:50 AM  Result Value Ref Range   WBC 12.0 (H) 4.0 - 10.5 K/uL   RBC 3.73 (L) 3.87 - 5.11 MIL/uL   Hemoglobin 9.9 (L) 12.0 - 15.0 g/dL   HCT 29.6 (L) 36.0 - 46.0 %   MCV 79.4 78.0 - 100.0 fL   MCH 26.5 26.0 - 34.0 pg   MCHC 33.4 30.0 - 36.0 g/dL   RDW 15.7 (H) 11.5 - 15.5 %   Platelets 174 150 - 400 K/uL  Glucose, capillary   Collection Time: 10/10/17  6:41 AM  Result Value Ref Range   Glucose-Capillary 74 70 - 99 mg/dL    Imaging Studies:       Medications:  Scheduled . aspirin EC  81 mg Oral Daily  . enoxaparin (LOVENOX) injection  60 mg Subcutaneous Q24H  . labetalol  100 mg Oral BID  . prenatal multivitamin  1 tablet Oral Q1200   I have reviewed the patient's current medications.  ASSESSMENT: Active Problems:   Chronic hypertension during pregnancy, antepartum   GERD (gastroesophageal reflux disease)   Pyelonephritis affecting pregnancy   GDM, class A1   PLAN: 1. Pylonephritis  Bl Cx pending  UCx: E coli pan sensitive  WBC improved  Continue IV antibiotics until afebrile 48 hours, then D/c to home with oral Keflex.   NST reactive x2  2. GDM A1  Continue CBGs  3. CHTN  Continue ASA 81mg   On labetalol 100mg  BID  Growth [place] on 8/8  4. GERD   Continue routine antenatal care.   Truett Mainland, DO 10/10/2017,11:10 AM

## 2017-10-11 LAB — CBC
HEMATOCRIT: 29 % — AB (ref 36.0–46.0)
HEMOGLOBIN: 9.9 g/dL — AB (ref 12.0–15.0)
MCH: 26.8 pg (ref 26.0–34.0)
MCHC: 34.1 g/dL (ref 30.0–36.0)
MCV: 78.6 fL (ref 78.0–100.0)
Platelets: 194 10*3/uL (ref 150–400)
RBC: 3.69 MIL/uL — ABNORMAL LOW (ref 3.87–5.11)
RDW: 15.5 % (ref 11.5–15.5)
WBC: 10.5 10*3/uL (ref 4.0–10.5)

## 2017-10-11 LAB — ABO/RH: ABO/RH(D): A POS

## 2017-10-11 LAB — TYPE AND SCREEN
ABO/RH(D): A POS
Antibody Screen: NEGATIVE

## 2017-10-11 LAB — GLUCOSE, CAPILLARY: Glucose-Capillary: 96 mg/dL (ref 70–99)

## 2017-10-11 MED ORDER — CEPHALEXIN 500 MG PO CAPS: 500.0000 mg | ORAL_CAPSULE | Freq: Four times a day (QID) | ORAL | 0 refills | Status: DC

## 2017-10-11 NOTE — Progress Notes (Signed)
Discharge instructions reviewed with patient. Discussed the importance of eating healthy meals throughout the day and taking antibiotics on time and appropriately through completion of course. Patient verbalized understanding, RN answered questions at this time. Discussed medication changes, follow up appointments and signs of labor.  Patient verbalized understanding of all information.  IV removed and paperwork signed at this time.

## 2017-10-11 NOTE — Discharge Summary (Addendum)
Physician Discharge Summary  Patient ID: Leslie Skinner MRN: [id number] DOB/AGE: [birth year] 24 y.o.  Admit date: 10/07/2017 Discharge date: 10/11/2017  Admission Diagnoses:  Discharge Diagnoses:  Active Problems:   Chronic hypertension during pregnancy, antepartum   GERD (gastroesophageal reflux disease)   Pyelonephritis affecting pregnancy   GDM, class A1   Discharged Condition: good  Hospital Course: 22 year old 1 P0 at 37 weeks and 2 days.  Patient admitted on 7/29 for pyelonephritis and was started on ceftriaxone, which was changed to Zosyn due to increasing white blood cell count continued fevers.  Patient remained afebrile for the next 48hours.  Urine culture showed E. coli with pan sensitivity.  Patient will be discharged home to finish 14 days of antibiotics.  Discharge antibiotic will be Keflex.  Consults: None  Significant Diagnostic Studies: microbiology: urine culture: positive for E. coli  Treatments: antibiotics: Zosyn  Discharge Exam: Blood pressure (!) 150/72, pulse (!) 103, temperature 98.5 F (36.9 C), temperature source Oral, resp. rate 18, height 5\' 2"  (1.575 m), weight 252 lb (114.3 kg), last menstrual period 01/23/2017, SpO2 98 %, unknown if currently breastfeeding. General appearance: alert, cooperative and no distress Resp: clear to auscultation bilaterally Cardio: regular rate and rhythm, S1, S2 normal, no murmur, click, rub or gallop GI: soft, non-tender; bowel sounds normal; no masses,  no organomegaly Extremities: extremities normal, atraumatic, no cyanosis or edema Pulses: 2+ and symmetric   NST: Baseline 140, moderate variability, multiple accelerations, no decelerations Disposition: Discharge disposition: 01-Home or Self Care       Discharge Instructions    Discharge activity:  No Restrictions   Complete by:  As directed    LABOR:  When conractions begin, you should start to time them from the beginning of one contraction to the  beginning  of the next.  When contractions are 5 - 10 minutes apart or less and have been regular for at least an hour, you should call your health care provider.   Complete by:  As directed    No sexual activity restrictions   Complete by:  As directed    Notify physician for bleeding from the vagina   Complete by:  As directed    Notify physician for blurring of vision or spots before the eyes   Complete by:  As directed    Notify physician for chills or fever   Complete by:  As directed    Notify physician for fainting spells, "black outs" or loss of consciousness   Complete by:  As directed    Notify physician for increase in vaginal discharge   Complete by:  As directed    Notify physician for leaking of fluid   Complete by:  As directed    Notify physician for pain or burning when urinating   Complete by:  As directed    Notify physician for pelvic pressure (sudden increase)   Complete by:  As directed    Notify physician for severe or continued nausea or vomiting   Complete by:  As directed    Notify physician for sudden gushing of fluid from the vagina (with or without continued leaking)   Complete by:  As directed    Notify physician for sudden, constant, or occasional abdominal pain   Complete by:  As directed    Notify physician if baby moving less than usual   Complete by:  As directed      Allergies as of 10/11/2017      Reactions   Morphine  And Related Other (See Comments)   Hot flashes, can't breathe   Tramadol Other (See Comments)   Hot flashes       Medication List    TAKE these medications   acetaminophen 500 MG tablet Commonly known as:  TYLENOL Take 1 tablet (500 mg total) by mouth every 6 (six) hours as needed. What changed:  reasons to take this   aspirin 81 MG tablet Take 1 tablet (81 mg total) by mouth daily.   cephALEXin 500 MG capsule Commonly known as:  KEFLEX Take 1 capsule (500 mg total) by mouth 4 (four) times daily for 12 days.    labetalol 100 MG tablet Commonly known as:  NORMODYNE Take 1 tablet (100 mg total) by mouth 2 (two) times daily.   PRENATAL COMPLETE 14-0.4 MG Tabs Take 1 tablet by mouth daily.      Follow-up Eleanor for [place] Follow up on 10/17/2017.   Specialty:  Obstetrics and Gynecology Contact information: [place] [phone]          Signed: Truett Mainland 10/11/2017, 10:49 AM

## 2017-10-11 NOTE — Discharge Instructions (Signed)

## 2017-10-13 LAB — CULTURE, BLOOD (ROUTINE X 2)
CULTURE: NO GROWTH
CULTURE: NO GROWTH
SPECIAL REQUESTS: ADEQUATE
SPECIAL REQUESTS: ADEQUATE

## 2017-10-16 ENCOUNTER — Ambulatory Visit (HOSPITAL_COMMUNITY): Payer: Medicaid Other

## 2017-10-17 ENCOUNTER — Encounter (HOSPITAL_COMMUNITY): Payer: Self-pay

## 2017-10-17 ENCOUNTER — Ambulatory Visit (HOSPITAL_COMMUNITY)
Admission: RE | Admit: 2017-10-17 | Discharge: 2017-10-17 | Disposition: A | Discharge status: Home / Self Care | Payer: Medicaid Other | Source: Ambulatory Visit | Attending: Student | Admitting: Student

## 2017-10-17 ENCOUNTER — Encounter: Payer: Medicaid Other | Admitting: Obstetrics & Gynecology

## 2017-10-21 ENCOUNTER — Ambulatory Visit: Payer: Self-pay

## 2017-10-21 ENCOUNTER — Ambulatory Visit (INDEPENDENT_AMBULATORY_CARE_PROVIDER_SITE_OTHER): Payer: Medicaid Other | Admitting: Obstetrics & Gynecology

## 2017-10-21 ENCOUNTER — Ambulatory Visit (INDEPENDENT_AMBULATORY_CARE_PROVIDER_SITE_OTHER): Payer: Medicaid Other | Admitting: *Deleted

## 2017-10-21 VITALS — BP 138/62 | HR 70 | Wt 247.8 lb

## 2017-10-21 DIAGNOSIS — O10919 Unspecified pre-existing hypertension complicating pregnancy, unspecified trimester: Secondary | ICD-10-CM

## 2017-10-21 DIAGNOSIS — O10913 Unspecified pre-existing hypertension complicating pregnancy, third trimester: Secondary | ICD-10-CM | POA: Diagnosis present

## 2017-10-21 DIAGNOSIS — O0990 Supervision of high risk pregnancy, unspecified, unspecified trimester: Secondary | ICD-10-CM

## 2017-10-21 DIAGNOSIS — O24410 Gestational diabetes mellitus in pregnancy, diet controlled: Secondary | ICD-10-CM

## 2017-10-21 DIAGNOSIS — E66813 Obesity, class 3: Secondary | ICD-10-CM

## 2017-10-21 NOTE — Progress Notes (Signed)
I have reviewed this chart and agree with the RN/CMA assessment and management.    Lawson Isabell C Derrik Mceachern, MD, FACOG Attending Physician, [facility]  

## 2017-10-21 NOTE — Progress Notes (Signed)
Pt informed that the ultrasound is considered a limited OB ultrasound and is not intended to be a complete ultrasound exam.  Patient also informed that the ultrasound is not being completed with the intent of assessing for fetal or placental anomalies or any pelvic abnormalities.  Explained that the purpose of today's ultrasound is to assess for presentation, BPP and amniotic fluid volume.  Patient acknowledges the purpose of the exam and the limitations of the study.    IOL scheduled on 8/14 @ midnight.

## 2017-10-21 NOTE — Progress Notes (Signed)
Pt states she was admitted to [facility] from 7/29-8/2 due to pyelonephritis.  She missed her appt in this office and MFM on 8/8. She has only been taking Keflex once daily because she says it makes her sick.

## 2017-10-21 NOTE — Progress Notes (Signed)
   PRENATAL VISIT NOTE  Subjective:  Leslie Skinner is a 22 y.o. G1P0 at [redacted]w[redacted]d being seen today for ongoing prenatal care.  She is currently monitored for the following issues for this high-risk pregnancy and has Chronic hypertension during pregnancy, antepartum; GERD (gastroesophageal reflux disease); Migraine; Scoliosis; Obesity, Class III, BMI 40-49.9 (morbid obesity) ([place]); Anxiety state, unspecified; Pre-diabetes; Supervision of high risk pregnancy, antepartum; Pyelonephritis affecting pregnancy; and GDM, class A1 on their problem list.  Patient reports no complaints.  Contractions: Not present. Vag. Bleeding: None.  Movement: Present. Denies leaking of fluid.   The following portions of the patient's history were reviewed and updated as appropriate: allergies, current medications, past family history, past medical history, past social history, past surgical history and problem list. Problem list updated.  Objective:   Vitals:   10/21/17 0931  BP: 138/62  Pulse: 70  Weight: 247 lb 12.8 oz (112.4 kg)    Fetal Status: Fetal Heart Rate (bpm): NST   Movement: Present     General:  Alert, oriented and cooperative. Patient is in no acute distress.  Skin: Skin is warm and dry. No rash noted.   Cardiovascular: Normal heart rate noted  Respiratory: Normal respiratory effort, no problems with respiration noted  Abdomen: Soft, gravid, appropriate for gestational age.  Pain/Pressure: Present     Pelvic: Cervical exam performed        Extremities: Normal range of motion.  Edema: Trace  Mental Status: Normal mood and affect. Normal behavior. Normal judgment and thought content.   Assessment and Plan:  Pregnancy: G1P0 at [redacted]w[redacted]d  1. Supervision of high risk pregnancy, antepartum   2. Obesity, Class III, BMI 40-49.9 (morbid obesity) ([place])   3. GDM, class A1 - She did not bring in her recorded sugars, cannot recall what any sugars were recently - Hemoglobin A1c - She has not kept her  appts for growth u/s with MFM  4. Chronic hypertension during pregnancy, antepartum - on labetolol - IOL for tomorrow  Term labor symptoms and general obstetric precautions including but not limited to vaginal bleeding, contractions, leaking of fluid and fetal movement were reviewed in detail with the patient. Please refer to After Visit Summary for other counseling recommendations.  Return in about 5 weeks (around 11/25/2017) for post partum exam.  Future Appointments  Date Time Provider [place]  10/23/2017 12:00 AM [facility] ROOM [facility] None    Emily Filbert, MD

## 2017-10-22 ENCOUNTER — Encounter (HOSPITAL_COMMUNITY): Payer: Self-pay | Admitting: *Deleted

## 2017-10-22 ENCOUNTER — Other Ambulatory Visit: Payer: Self-pay | Admitting: Family Medicine

## 2017-10-22 ENCOUNTER — Other Ambulatory Visit: Payer: Self-pay | Admitting: Advanced Practice Midwife

## 2017-10-22 ENCOUNTER — Telehealth (HOSPITAL_COMMUNITY): Payer: Self-pay | Admitting: *Deleted

## 2017-10-22 LAB — HEMOGLOBIN A1C
ESTIMATED AVERAGE GLUCOSE: 117 mg/dL
Hgb A1c MFr Bld: 5.7 % — ABNORMAL HIGH (ref 4.8–5.6)

## 2017-10-22 NOTE — Telephone Encounter (Signed)
Preadmission screen  

## 2017-10-23 ENCOUNTER — Inpatient Hospital Stay (HOSPITAL_COMMUNITY)
Admission: RE | Admit: 2017-10-23 | Discharge: 2017-10-27 | DRG: 807 | Disposition: A | Discharge status: Home / Self Care | Payer: Medicaid Other | Attending: Obstetrics and Gynecology | Admitting: Obstetrics and Gynecology

## 2017-10-23 ENCOUNTER — Other Ambulatory Visit: Payer: Self-pay

## 2017-10-23 DIAGNOSIS — O1002 Pre-existing essential hypertension complicating childbirth: Principal | ICD-10-CM | POA: Diagnosis present

## 2017-10-23 DIAGNOSIS — O24420 Gestational diabetes mellitus in childbirth, diet controlled: Secondary | ICD-10-CM | POA: Diagnosis present

## 2017-10-23 DIAGNOSIS — Z3A39 39 weeks gestation of pregnancy: Secondary | ICD-10-CM

## 2017-10-23 DIAGNOSIS — O99214 Obesity complicating childbirth: Secondary | ICD-10-CM | POA: Diagnosis present

## 2017-10-23 DIAGNOSIS — O99824 Streptococcus B carrier state complicating childbirth: Secondary | ICD-10-CM | POA: Diagnosis present

## 2017-10-23 LAB — CBC
HEMATOCRIT: 32.8 % — AB (ref 36.0–46.0)
HEMOGLOBIN: 10.8 g/dL — AB (ref 12.0–15.0)
MCH: 26 pg (ref 26.0–34.0)
MCHC: 32.9 g/dL (ref 30.0–36.0)
MCV: 78.8 fL (ref 78.0–100.0)
Platelets: 250 10*3/uL (ref 150–400)
RBC: 4.16 MIL/uL (ref 3.87–5.11)
RDW: 16.2 % — ABNORMAL HIGH (ref 11.5–15.5)
WBC: 11.7 10*3/uL — ABNORMAL HIGH (ref 4.0–10.5)

## 2017-10-23 LAB — GLUCOSE, CAPILLARY
GLUCOSE-CAPILLARY: 81 mg/dL (ref 70–99)
Glucose-Capillary: 104 mg/dL — ABNORMAL HIGH (ref 70–99)
Glucose-Capillary: 85 mg/dL (ref 70–99)
Glucose-Capillary: 117 mg/dL — ABNORMAL HIGH (ref 70–99)
Glucose-Capillary: 86 mg/dL (ref 70–99)
Glucose-Capillary: 80 mg/dL (ref 70–99)

## 2017-10-23 LAB — RPR: RPR: NONREACTIVE

## 2017-10-23 LAB — TYPE AND SCREEN
ABO/RH(D): A POS
ANTIBODY SCREEN: NEGATIVE

## 2017-10-23 MED ORDER — SOD CITRATE-CITRIC ACID 500-334 MG/5ML PO SOLN: 30.0000 mL | ORAL | Status: DC | PRN

## 2017-10-23 MED ORDER — ACETAMINOPHEN 325 MG PO TABS: 650.0000 mg | ORAL_TABLET | ORAL | Status: DC | PRN

## 2017-10-23 MED ORDER — OXYTOCIN 40 UNITS IN LACTATED RINGERS INFUSION - SIMPLE MED: 2.5000 [IU]/h | INTRAVENOUS | Status: DC

## 2017-10-23 MED ORDER — PENICILLIN G 3 MILLION UNITS IVPB - SIMPLE MED
3.0000 10*6.[IU] | INTRAVENOUS | Status: DC
  Administered 2017-10-23 – 2017-10-25 (×13): 3 10*6.[IU] via INTRAVENOUS
  Filled 2017-10-23 (×3): qty 3
  Filled 2017-10-23: qty 100
  Filled 2017-10-23 (×2): qty 3
  Filled 2017-10-23: qty 100
  Filled 2017-10-23 (×4): qty 3
  Filled 2017-10-23: qty 100
  Filled 2017-10-23: qty 3
  Filled 2017-10-23: qty 100
  Filled 2017-10-23 (×3): qty 3

## 2017-10-23 MED ORDER — LACTATED RINGERS IV SOLN
INTRAVENOUS | Status: DC
  Administered 2017-10-23 – 2017-10-24 (×7): via INTRAVENOUS

## 2017-10-23 MED ORDER — ONDANSETRON HCL 4 MG/2ML IJ SOLN
4.0000 mg | Freq: Four times a day (QID) | INTRAMUSCULAR | Status: DC | PRN
  Administered 2017-10-23: 4 mg via INTRAVENOUS
  Filled 2017-10-23: qty 2

## 2017-10-23 MED ORDER — SODIUM CHLORIDE 0.9 % IV SOLN
5.0000 10*6.[IU] | Freq: Once | INTRAVENOUS | Status: AC
  Administered 2017-10-23: 5 10*6.[IU] via INTRAVENOUS
  Filled 2017-10-23: qty 5

## 2017-10-23 MED ORDER — TERBUTALINE SULFATE 1 MG/ML IJ SOLN
0.2500 mg | Freq: Once | INTRAMUSCULAR | Status: DC | PRN
  Filled 2017-10-23: qty 1

## 2017-10-23 MED ORDER — LACTATED RINGERS IV SOLN
500.0000 mL | INTRAVENOUS | Status: DC | PRN
  Administered 2017-10-24: 500 mL via INTRAVENOUS

## 2017-10-23 MED ORDER — FENTANYL CITRATE (PF) 100 MCG/2ML IJ SOLN
100.0000 ug | INTRAMUSCULAR | Status: DC | PRN
Start: 2017-10-23 — End: 2017-10-25
  Administered 2017-10-23 – 2017-10-24 (×5): 100 ug via INTRAVENOUS
  Filled 2017-10-23 (×6): qty 2

## 2017-10-23 MED ORDER — PROMETHAZINE HCL 25 MG/ML IJ SOLN
25.0000 mg | Freq: Four times a day (QID) | INTRAMUSCULAR | Status: DC | PRN
  Administered 2017-10-23: 25 mg via INTRAVENOUS
  Filled 2017-10-23: qty 1

## 2017-10-23 MED ORDER — LIDOCAINE HCL (PF) 1 % IJ SOLN
30.0000 mL | INTRAMUSCULAR | Status: DC | PRN
  Filled 2017-10-23: qty 30

## 2017-10-23 MED ORDER — LABETALOL HCL 100 MG PO TABS
100.0000 mg | ORAL_TABLET | Freq: Two times a day (BID) | ORAL | Status: DC
  Administered 2017-10-23 – 2017-10-25 (×4): 100 mg via ORAL
  Filled 2017-10-23 (×5): qty 1

## 2017-10-23 MED ORDER — MISOPROSTOL 50MCG HALF TABLET
50.0000 ug | ORAL_TABLET | ORAL | Status: DC | PRN
  Administered 2017-10-23 – 2017-10-24 (×5): 50 ug via BUCCAL
  Filled 2017-10-23 (×8): qty 1

## 2017-10-23 MED ORDER — OXYTOCIN BOLUS FROM INFUSION: 500.0000 mL | Freq: Once | INTRAVENOUS | Status: DC
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
## 2017-10-23 NOTE — Anesthesia Pain Management Evaluation Note (Signed)
  CRNA Pain Management Visit Note  Patient: Leslie Skinner, 22 y.o., female  "Hello I am a member of the anesthesia team at [facility]. We have an anesthesia team available at all times to provide care throughout the hospital, including epidural management and anesthesia for C-section. I don't know your plan for the delivery whether it a natural birth, water birth, IV sedation, nitrous supplementation, doula or epidural, but we want to meet your pain goals."   1.Was your pain managed to your expectations on prior hospitalizations?   No prior hospitalizations  2.What is your expectation for pain management during this hospitalization?     Epidural  3.How can we help you reach that goal? Epidural when ready  Record the patient's initial score and the patient's pain goal.   Pain: 0  Pain Goal: 2 [facility] wants you to be able to say your pain was always managed very well.  Everette Rank 10/23/2017

## 2017-10-23 NOTE — Progress Notes (Addendum)
Cervical recheck shows 1/0/-3 but cervix has changed from posterior to midline.  Blood pressure wnl. FHT baseline 150bpm, moderate variability, + accels, no decels. Cat 1. No ctx at this time.Will repeat PO cytotec 70mcg and recheck in 4 hours. May consider FB at that time. Currently on pcn for GBS ppx  Lockie Pares, DO Family Medicine, PGY-3

## 2017-10-23 NOTE — Progress Notes (Signed)
Patient sleeping comfortably when entered room. Family members at bedside.   FB has been in place since 1515. Will plan to check cervix once FB out and start Pitocin.   FHT: Baseline 140 bpm, moderate variability, +acels, no decels  Toco: Irregular with UI   Patient planning for epidural. Anticipate NSVD.   Phill Myron, D.O. OB Fellow  10/23/2017, 9:12 PM

## 2017-10-24 ENCOUNTER — Inpatient Hospital Stay (HOSPITAL_COMMUNITY): Payer: Medicaid Other | Admitting: Anesthesiology

## 2017-10-24 LAB — GLUCOSE, CAPILLARY
GLUCOSE-CAPILLARY: 103 mg/dL — AB (ref 70–99)
GLUCOSE-CAPILLARY: 105 mg/dL — AB (ref 70–99)
GLUCOSE-CAPILLARY: 147 mg/dL — AB (ref 70–99)
GLUCOSE-CAPILLARY: 68 mg/dL — AB (ref 70–99)
GLUCOSE-CAPILLARY: 69 mg/dL — AB (ref 70–99)
Glucose-Capillary: 110 mg/dL — ABNORMAL HIGH (ref 70–99)
Glucose-Capillary: 86 mg/dL (ref 70–99)

## 2017-10-24 LAB — CBC
HEMATOCRIT: 31.1 % — AB (ref 36.0–46.0)
HEMOGLOBIN: 10.4 g/dL — AB (ref 12.0–15.0)
MCH: 26.6 pg (ref 26.0–34.0)
MCHC: 33.4 g/dL (ref 30.0–36.0)
MCV: 79.5 fL (ref 78.0–100.0)
Platelets: 201 10*3/uL (ref 150–400)
RBC: 3.91 MIL/uL (ref 3.87–5.11)
RDW: 15.9 % — ABNORMAL HIGH (ref 11.5–15.5)
WBC: 10 10*3/uL (ref 4.0–10.5)

## 2017-10-24 MED ORDER — TERBUTALINE SULFATE 1 MG/ML IJ SOLN
0.2500 mg | Freq: Once | INTRAMUSCULAR | Status: DC | PRN
  Filled 2017-10-24: qty 1

## 2017-10-24 MED ORDER — FENTANYL 2.5 MCG/ML BUPIVACAINE 1/10 % EPIDURAL INFUSION (WH - ANES)
14.0000 mL/h | INTRAMUSCULAR | Status: DC | PRN
  Administered 2017-10-24 – 2017-10-25 (×3): 14 mL/h via EPIDURAL
  Filled 2017-10-24 (×3): qty 100

## 2017-10-24 MED ORDER — EPHEDRINE 5 MG/ML INJ
10.0000 mg | INTRAVENOUS | Status: DC | PRN
  Filled 2017-10-24: qty 2

## 2017-10-24 MED ORDER — LIDOCAINE HCL (PF) 1 % IJ SOLN
INTRAMUSCULAR | Status: DC | PRN
  Administered 2017-10-24: 5 mL via EPIDURAL

## 2017-10-24 MED ORDER — LACTATED RINGERS IV SOLN
500.0000 mL | Freq: Once | INTRAVENOUS | Status: AC
  Administered 2017-10-24: 500 mL via INTRAVENOUS

## 2017-10-24 MED ORDER — LACTATED RINGERS IV SOLN: 500.0000 mL | Freq: Once | INTRAVENOUS | Status: DC

## 2017-10-24 MED ORDER — PHENYLEPHRINE 40 MCG/ML (10ML) SYRINGE FOR IV PUSH (FOR BLOOD PRESSURE SUPPORT): 80.0000 ug | PREFILLED_SYRINGE | INTRAVENOUS | Status: DC | PRN

## 2017-10-24 MED ORDER — DIPHENHYDRAMINE HCL 50 MG/ML IJ SOLN: 12.5000 mg | INTRAMUSCULAR | Status: DC | PRN

## 2017-10-24 MED ORDER — PHENYLEPHRINE 40 MCG/ML (10ML) SYRINGE FOR IV PUSH (FOR BLOOD PRESSURE SUPPORT)
80.0000 ug | PREFILLED_SYRINGE | INTRAVENOUS | Status: DC | PRN
  Filled 2017-10-24: qty 5
  Filled 2017-10-24: qty 10

## 2017-10-24 MED ORDER — EPHEDRINE 5 MG/ML INJ: 10.0000 mg | INTRAVENOUS | Status: DC | PRN

## 2017-10-24 MED ORDER — OXYTOCIN 40 UNITS IN LACTATED RINGERS INFUSION - SIMPLE MED
1.0000 m[IU]/min | INTRAVENOUS | Status: DC
  Administered 2017-10-24: 12 m[IU]/min via INTRAVENOUS
  Administered 2017-10-24: 2 m[IU]/min via INTRAVENOUS
  Filled 2017-10-24 (×2): qty 1000

## 2017-10-24 MED ORDER — PHENYLEPHRINE 40 MCG/ML (10ML) SYRINGE FOR IV PUSH (FOR BLOOD PRESSURE SUPPORT)
80.0000 ug | PREFILLED_SYRINGE | INTRAVENOUS | Status: DC | PRN
  Filled 2017-10-24: qty 5

## 2017-10-24 NOTE — Progress Notes (Signed)
Leslie Skinner is a 22 y.o. G1P0 at [redacted]w[redacted]d admitted for IOL for [facility]. 1 abnormal sugar at 117 but otherwise appropriate. Not currently on insulin gtt.  Objective: BP (!) 108/44   Pulse 89   Temp 98 F (36.7 C) (Oral)   Resp 18   LMP 01/23/2017  No intake/output data recorded. No intake/output data recorded.  FHT:  FHR: 140 bpm, variability: moderate,  accelerations:  Present,  decelerations:  Absent UC:   irregular, every 5-6 minutes SVE:   Dilation: 1.5 Effacement (%): Thick Station: -3 Exam by:: Sunday Corn CNM  Labs: Lab Results  Component Value Date   WBC 11.7 (H) 10/23/2017   HGB 10.8 (L) 10/23/2017   HCT 32.8 (L) 10/23/2017   MCV 78.8 10/23/2017   PLT 250 10/23/2017    Assessment / Plan: Induction of labor due to [facility],  FB still in place.   Labor: Progressing normally Preeclampsia:  no signs or symptoms of toxicity Fetal Wellbeing:  Category I ROM: intact Pain Control:  Labor support without medications and IV pain meds I/D:  Gbs pos - pcn Anticipated MOD:  NSVD   Lockie Pares, DO Family Medicine, PGY-3

## 2017-10-24 NOTE — Progress Notes (Signed)
Labor Progress Note Leslie Skinner is a 22 y.o. G1P0 at [redacted]w[redacted]d presented for IOL for cHTN and GDM  S:  Comfortable, not feeling ctx.  O:  BP (!) 114/54   Pulse 68   Temp 98.2 F (36.8 C) (Oral)   Resp 18   LMP 01/23/2017  EFM: baseline 150 bpm/ mod variability/ + accels/ no decels  Toco: 2-3 SVE: Dilation: 4.5 Effacement (%): 60 Cervical Position: Middle Station: -3 Presentation: Vertex Exam by:: Jaleen Grupp CNM Pitocin: 16 mu/min  A/P: 21 y.o. G1P0 [redacted]w[redacted]d  1. Labor: latent 2. FWB: Cat I 3. Pain: analgesia prn  Continue Pitocin titration. Consider AROM. Anticipate SVD.  Julianne Handler, CNM 4:55 PM

## 2017-10-24 NOTE — Anesthesia Preprocedure Evaluation (Addendum)
Anesthesia Evaluation  Patient identified by MRN, date of birth, ID band Patient awake    Reviewed: Allergy & Precautions, NPO status , Patient's Chart, lab work & pertinent test results  Airway Mallampati: III  TM Distance: >3 FB Neck ROM: Full    Dental no notable dental hx. (+) Teeth Intact, Dental Advisory Given   Pulmonary neg pulmonary ROS,    Pulmonary exam normal breath sounds clear to auscultation       Cardiovascular hypertension, Normal cardiovascular exam Rhythm:Regular Rate:Normal     Neuro/Psych  Headaches,    GI/Hepatic   Endo/Other  diabetes, GestationalMorbid obesity  Renal/GU      Musculoskeletal   Abdominal (+) + obese,   Peds  Hematology   Anesthesia Other Findings Pt W Gestational HTN on Labetalol  Reproductive/Obstetrics (+) Pregnancy                            Lab Results  Component Value Date   WBC 10.0 10/24/2017   HGB 10.4 (L) 10/24/2017   HCT 31.1 (L) 10/24/2017   MCV 79.5 10/24/2017   PLT 201 10/24/2017    Anesthesia Physical Anesthesia Plan  ASA: III  Anesthesia Plan: Epidural   Post-op Pain Management:    Induction:   PONV Risk Score and Plan:   Airway Management Planned:   Additional Equipment:   Intra-op Plan:   Post-operative Plan:   Informed Consent: I have reviewed the patients History and Physical, chart, labs and discussed the procedure including the risks, benefits and alternatives for the proposed anesthesia with the patient or authorized representative who has indicated his/her understanding and acceptance.     Plan Discussed with:   Anesthesia Plan Comments:         Anesthesia Quick Evaluation

## 2017-10-24 NOTE — Progress Notes (Signed)
Labor Progress Note Leslie Skinner is a 22 y.o. G1P0 at [redacted]w[redacted]d presented for IOL for cHTN and A1GDM. FB out, s/p Cytotec  S:  Comfortable, no c/o.  O:  BP 138/63   Pulse 87   Temp 97.8 F (36.6 C) (Oral)   Resp 18   LMP 01/23/2017  EFM: baseline 150 bpm/ mod variability/ + accels/ no decels  Toco: irregular SVE: Dilation: 1.5 Effacement (%): Thick Cervical Position: Middle Station: -3 Presentation: Vertex Exam by:: Sunday Corn CNM   A/P: 22 y.o. G1P0 [redacted]w[redacted]d  1. Labor: latent 2. FWB: Cat I 3. Pain: analgesia/anesthesia prn  Start Pitocin. Consider AROM. Anticipate SVD.  Julianne Handler, CNM 11:11 AM

## 2017-10-24 NOTE — Anesthesia Procedure Notes (Signed)
Epidural Patient location during procedure: OB Start time: 10/24/2017 8:06 PM End time: 10/24/2017 8:22 PM  Staffing Anesthesiologist: Barnet Glasgow, MD Performed: anesthesiologist   Preanesthetic Checklist Completed: patient identified, site marked, surgical consent, pre-op evaluation, timeout performed, IV checked, risks and benefits discussed and monitors and equipment checked  Epidural Patient position: sitting Prep: site prepped and draped and DuraPrep Patient monitoring: continuous pulse ox and blood pressure Approach: midline Location: L2-L3 Injection technique: LOR air  Needle:  Needle type: Tuohy  Needle gauge: 17 G Needle length: 9 cm and 9 Needle insertion depth: 9 cm Catheter type: closed end flexible Catheter size: 19 Gauge Catheter at skin depth: 15 cm Test dose: negative  Assessment Events: blood not aspirated, injection not painful, no injection resistance, negative IV test and no paresthesia  Additional Notes I attempt. Pt tolerated procedure well.

## 2017-10-24 NOTE — Progress Notes (Addendum)
LABOR PROGRESS NOTE  Leslie Skinner is a 22 y.o. G1P0 at [redacted]w[redacted]d  admitted for IOL for Leslie Skinner.  Subjective: Patient doing well and resting comfortably with epidural   Objective: BP 123/60   Pulse 74   Temp 98 F (36.7 C) (Oral)   Resp 16   LMP 01/23/2017   SpO2 98%  or  Vitals:   10/24/17 2201 10/24/17 2204 10/24/17 2220 10/24/17 2231  BP: 125/80 125/80  123/60  Pulse: 74 78  74  Resp:      Temp:   98 F (36.7 C)   TempSrc:   Oral   SpO2:         Dilation: 5.5 Effacement (%): 60 Cervical Position: Posterior Station: -2 Presentation: Vertex Exam by:: Manus Gunning, CNM FHT: baseline rate 145, moderate varibility, + acel, no decel Toco: Q2 min  Labs: Lab Results  Component Value Date   WBC 10.0 10/24/2017   HGB 10.4 (L) 10/24/2017   HCT 31.1 (L) 10/24/2017   MCV 79.5 10/24/2017   PLT 201 10/24/2017    Patient Active Problem List   Diagnosis Date Noted  . Gestational hypertension 10/23/2017  . GDM, class A1 10/08/2017  . Pyelonephritis affecting pregnancy 10/07/2017  . Supervision of high risk pregnancy, antepartum 04/02/2017  . Anxiety state, unspecified 06/12/2012  . Pre-diabetes 06/12/2012  . Obesity, Class III, BMI 40-49.9 (morbid obesity) ([place]) 08/23/2011  . Scoliosis 11/22/2010  . Migraine 11/08/2010  . GERD (gastroesophageal reflux disease)   . Chronic hypertension during pregnancy, antepartum 05/16/2010    Assessment / Plan: 22 y.o. G1P0 at [redacted]w[redacted]d here for IOL for CHTN.   Last BS 86 @ 2000  Labor: S/p FB, pit currently at 18, AROM @ 2220, IUPC placed Fetal Wellbeing:  Cat I Pain Control:  Epidural Anticipated MOD:  SVD  Manfred Arch, MS3  I have spoken with and examined this patient and agree with resident/PA-S/Med-S/SNM's note and plan of care. Nigel Berthold, CNM 10/24/2017 11:52 PM

## 2017-10-25 ENCOUNTER — Encounter (HOSPITAL_COMMUNITY): Payer: Self-pay

## 2017-10-25 DIAGNOSIS — O99824 Streptococcus B carrier state complicating childbirth: Secondary | ICD-10-CM

## 2017-10-25 DIAGNOSIS — O1002 Pre-existing essential hypertension complicating childbirth: Secondary | ICD-10-CM

## 2017-10-25 DIAGNOSIS — O24420 Gestational diabetes mellitus in childbirth, diet controlled: Secondary | ICD-10-CM

## 2017-10-25 DIAGNOSIS — Z3A39 39 weeks gestation of pregnancy: Secondary | ICD-10-CM

## 2017-10-25 LAB — CBC
HCT: 32.7 % — ABNORMAL LOW (ref 36.0–46.0)
Hemoglobin: 11.4 g/dL — ABNORMAL LOW (ref 12.0–15.0)
MCH: 27.9 pg (ref 26.0–34.0)
MCHC: 34.9 g/dL (ref 30.0–36.0)
MCV: 80.1 fL (ref 78.0–100.0)
Platelets: 214 10*3/uL (ref 150–400)
RBC: 4.08 MIL/uL (ref 3.87–5.11)
RDW: 16 % — ABNORMAL HIGH (ref 11.5–15.5)
WBC: 17.8 10*3/uL — ABNORMAL HIGH (ref 4.0–10.5)

## 2017-10-25 LAB — GLUCOSE, CAPILLARY
GLUCOSE-CAPILLARY: 77 mg/dL (ref 70–99)
GLUCOSE-CAPILLARY: 94 mg/dL (ref 70–99)

## 2017-10-25 MED ORDER — TETANUS-DIPHTH-ACELL PERTUSSIS 5-2.5-18.5 LF-MCG/0.5 IM SUSP: 0.5000 mL | Freq: Once | INTRAMUSCULAR | Status: DC

## 2017-10-25 MED ORDER — BENZOCAINE-MENTHOL 20-0.5 % EX AERO
1.0000 "application " | INHALATION_SPRAY | CUTANEOUS | Status: DC | PRN
  Administered 2017-10-26: 1 via TOPICAL
  Filled 2017-10-25: qty 56

## 2017-10-25 MED ORDER — DIBUCAINE 1 % RE OINT: 1.0000 "application " | TOPICAL_OINTMENT | RECTAL | Status: DC | PRN

## 2017-10-25 MED ORDER — SENNOSIDES-DOCUSATE SODIUM 8.6-50 MG PO TABS
2.0000 | ORAL_TABLET | Freq: Every evening | ORAL | Status: DC | PRN
  Administered 2017-10-25 – 2017-10-26 (×2): 2 via ORAL
  Filled 2017-10-25: qty 2

## 2017-10-25 MED ORDER — ONDANSETRON HCL 4 MG PO TABS: 4.0000 mg | ORAL_TABLET | ORAL | Status: DC | PRN

## 2017-10-25 MED ORDER — FENTANYL CITRATE (PF) 100 MCG/2ML IJ SOLN
INTRAMUSCULAR | Status: DC | PRN
  Administered 2017-10-25: 100 ug via EPIDURAL

## 2017-10-25 MED ORDER — ACETAMINOPHEN 325 MG PO TABS
650.0000 mg | ORAL_TABLET | ORAL | Status: DC | PRN
  Administered 2017-10-25 – 2017-10-27 (×5): 650 mg via ORAL
  Filled 2017-10-25 (×5): qty 2

## 2017-10-25 MED ORDER — ONDANSETRON HCL 4 MG/2ML IJ SOLN: 4.0000 mg | INTRAMUSCULAR | Status: DC | PRN

## 2017-10-25 MED ORDER — BUPIVACAINE HCL (PF) 0.25 % IJ SOLN
INTRAMUSCULAR | Status: DC | PRN
  Administered 2017-10-25: 8 mL via EPIDURAL

## 2017-10-25 MED ORDER — WITCH HAZEL-GLYCERIN EX PADS: 1.0000 "application " | MEDICATED_PAD | CUTANEOUS | Status: DC | PRN

## 2017-10-25 MED ORDER — COCONUT OIL OIL: 1.0000 "application " | TOPICAL_OIL | Status: DC | PRN

## 2017-10-25 MED ORDER — SIMETHICONE 80 MG PO CHEW: 80.0000 mg | CHEWABLE_TABLET | ORAL | Status: DC | PRN

## 2017-10-25 MED ORDER — ZOLPIDEM TARTRATE 5 MG PO TABS: 5.0000 mg | ORAL_TABLET | Freq: Every evening | ORAL | Status: DC | PRN

## 2017-10-25 MED ORDER — IBUPROFEN 600 MG PO TABS
600.0000 mg | ORAL_TABLET | Freq: Four times a day (QID) | ORAL | Status: DC
  Administered 2017-10-25 – 2017-10-27 (×8): 600 mg via ORAL
  Filled 2017-10-25 (×8): qty 1

## 2017-10-25 MED ORDER — DIPHENHYDRAMINE HCL 25 MG PO CAPS: 25.0000 mg | ORAL_CAPSULE | Freq: Four times a day (QID) | ORAL | Status: DC | PRN

## 2017-10-25 MED ORDER — FENTANYL CITRATE (PF) 100 MCG/2ML IJ SOLN
100.0000 ug | Freq: Once | INTRAMUSCULAR | Status: AC
  Administered 2017-10-25: 100 ug via INTRAVENOUS
  Filled 2017-10-25: qty 2

## 2017-10-25 MED ORDER — PRENATAL MULTIVITAMIN CH
1.0000 | ORAL_TABLET | Freq: Every day | ORAL | Status: DC
  Administered 2017-10-26 – 2017-10-27 (×2): 1 via ORAL
  Filled 2017-10-25 (×2): qty 1

## 2017-10-25 NOTE — Progress Notes (Addendum)
LABOR PROGRESS NOTE  Leslie Skinner is a 22 y.o. G1P0 at [redacted]w[redacted]d  admitted for IOL for [facility].  Subjective: Patient doing well and resting comfortably with epidural   Objective: BP 138/80   Pulse 77   Temp 97.8 F (36.6 C) (Oral)   Resp 16   LMP 01/23/2017   SpO2 99%  or  Vitals:   10/25/17 0301 10/25/17 0331 10/25/17 0400 10/25/17 0431  BP: 131/83 139/79 (!) 141/81 138/80  Pulse: 88 76 78 77  Resp: 18  16   Temp:  97.8 F (36.6 C)    TempSrc:  Oral    SpO2: 99%  99%      Dilation: 6 Effacement (%): 90 Cervical Position: Posterior Station: -2 Presentation: Vertex Exam by:: Sheryle Hail, RN FHT: baseline rate 135, moderate varibility, + acel, no decel Toco: Q2 min  Labs: Lab Results  Component Value Date   WBC 10.0 10/24/2017   HGB 10.4 (L) 10/24/2017   HCT 31.1 (L) 10/24/2017   MCV 79.5 10/24/2017   PLT 201 10/24/2017    Patient Active Problem List   Diagnosis Date Noted  . Gestational hypertension 10/23/2017  . GDM, class A1 10/08/2017  . Pyelonephritis affecting pregnancy 10/07/2017  . Supervision of high risk pregnancy, antepartum 04/02/2017  . Anxiety state, unspecified 06/12/2012  . Pre-diabetes 06/12/2012  . Obesity, Class III, BMI 40-49.9 (morbid obesity) ([place]) 08/23/2011  . Scoliosis 11/22/2010  . Migraine 11/08/2010  . GERD (gastroesophageal reflux disease)   . Chronic hypertension during pregnancy, antepartum 05/16/2010    Assessment / Plan: 22 y.o. G1P0 at [redacted]w[redacted]d here for IOL for CHTN.   Last BS 77 @ [phone]  Labor: S/p FB, pit currently at 20, AROM @ 2220, IUPC placed Fetal Wellbeing:  Cat I Pain Control:  Epidural Anticipated MOD:  SVD  Manfred Arch, MS3

## 2017-10-25 NOTE — Lactation Note (Signed)
This note was copied from a baby's chart. Lactation Consultation Note  Patient Name: Leslie Skinner [id number] Date: 10/25/2017 Reason for consult: Initial assessment;Primapara;1st time breastfeeding;Term  5 hours old FT female who is being formula fed by his mother, per mom baby won't latch on but she does plan to BF. Mom is a P1, she took BF classes at the [facility] office in [place] [facility], she already knows how to hand express. When assisting mom with hand expression, noted her right nipple was short shafted, the left one looks a bit more everted. Colostrum easily flowed when doing hand expression. Offered assistance with latch but mom politely declined stating baby just bed formula. Asked mom to call for latch assistance the next time baby is ready to feed.  Mom doesn't have a pump at home, [facility] offered a hand pump from the hospital, pump instructions, cleaning and storage was reviewed, as well as milk storage guidelines. When assisting mom with pumping, also noted colostrum flowed easily, a few drops were present inside the flange. Encouraged mom to finger feed baby her EBM before offering any bottles of formula. Baby has already been fed Gerber gentle with a slow flow nipple, per mom due to low sugar. Advised mom to follow the volumes for supplementation guidelines according the baby's age.   Encouraged mom to feed baby at the breast STS 8-12 times/24 hours or sooner if feeding cues are present. Mom will work with pre-pumping and finger feed baby her EBM. BF brochure, BF resources and feeding diary were reviewed, mom is aware of [place] services and will call PRN.  Maternal Data Formula Feeding for Exclusion: No Has patient been taught Hand Expression?: Yes Does the patient have breastfeeding experience prior to this delivery?: No  Feeding Feeding Type: Bottle Fed - Formula Nipple Type: Slow - flow  Interventions Interventions: Breast feeding basics reviewed;Breast massage;Hand express;Breast  compression;Pre-pump if needed;Hand pump;Expressed milk  Lactation Tools Discussed/Used Tools: Pump Breast pump type: Manual WIC Program: Yes Pump Review: Setup, frequency, and cleaning;Milk Storage Initiated by:: MPeck Date initiated:: 10/25/17   Consult Status Consult Status: Follow-up Date: 10/26/17 Follow-up type: In-patient    Reznor Ferrando Francene Boyers 10/25/2017, 6:17 PM

## 2017-10-25 NOTE — Progress Notes (Signed)
Patient ID: Leslie Skinner, female   DOB: 12/27/1995, 22 y.o.   MRN: [id number]  Just became complete x 20 mins and beginning to push  BP 129/83, P 89 FHR 140s, +accels, occ mi variables Ctx q 2 mins w/ Pit @ 75mu/min Vtx- caput at +2  IUP@term  cHTN A1GDM End 1st stage  Continue with pushing; has mod effort Anticipate SVD  Serita Grammes CNM 10/25/2017

## 2017-10-26 LAB — CBC
HEMATOCRIT: 29.5 % — AB (ref 36.0–46.0)
Hemoglobin: 9.5 g/dL — ABNORMAL LOW (ref 12.0–15.0)
MCH: 25.7 pg — AB (ref 26.0–34.0)
MCHC: 32.2 g/dL (ref 30.0–36.0)
MCV: 79.7 fL (ref 78.0–100.0)
Platelets: 195 10*3/uL (ref 150–400)
RBC: 3.7 MIL/uL — ABNORMAL LOW (ref 3.87–5.11)
RDW: 16.4 % — AB (ref 11.5–15.5)
WBC: 16.7 10*3/uL — ABNORMAL HIGH (ref 4.0–10.5)

## 2017-10-26 NOTE — Anesthesia Postprocedure Evaluation (Signed)
Anesthesia Post Note  Patient: Leslie Skinner  Procedure(s) Performed: AN AD HOC LABOR EPIDURAL     Patient location during evaluation: Mother Baby Anesthesia Type: Epidural Level of consciousness: awake and alert Pain management: pain level controlled Vital Signs Assessment: post-procedure vital signs reviewed and stable Respiratory status: spontaneous breathing, nonlabored ventilation and respiratory function stable Cardiovascular status: stable Postop Assessment: no headache, no backache, epidural receding, able to ambulate, adequate PO intake, no apparent nausea or vomiting and patient able to bend at knees Anesthetic complications: no    Last Vitals:  Vitals:   10/25/17 2300 10/26/17 0511  BP: (!) 114/58 106/70  Pulse: 82 80  Resp: 18 17  Temp: 37 C   SpO2: 98%     Last Pain:  Vitals:   10/26/17 0511  TempSrc:   PainSc: 3    Pain Goal: Patients Stated Pain Goal: 4 (10/24/17 0923)               Jabier Mutton

## 2017-10-26 NOTE — Progress Notes (Signed)
Post Partum Day 1 Subjective: Patient is doing well overall. She endorses a minor headache and general lower abdominal soreness. She denies changes in vision, RUQ pain, chest pain, SOB, or excessive LE swelling. She has not passed flatus. Endorses urinating normally but with some pain. She is both breast and bottle feeding. She plans for depo patches for birth control. She plans for outpatient circumcision.  Objective: Blood pressure 106/70, pulse 80, temperature 98.6 F (37 C), temperature source Oral, resp. rate 17, last menstrual period 01/23/2017, SpO2 98 %, unknown if currently breastfeeding.  Physical Exam:  General: alert and no distress Lochia: appropriate Uterine Fundus: firm Incision: n/a DVT Evaluation: No evidence of DVT seen on physical exam.  Recent Labs    10/25/17 1412 10/26/17 0543  HGB 11.4* 9.5*  HCT 32.7* 29.5*    Assessment/Plan: Leslie Skinner is a 22 year-old G1P1001 who is PPD #1 from SVD. She is in good condition. Her blood pressures should continue to be monitored due to chronic hypertension. Plan for discharge tomorrow.   LOS: 3 days   Isabel Caprice 10/26/2017, 7:20 AM

## 2017-10-26 NOTE — Progress Notes (Signed)
CSW received consult for Leslie Skinner due to history of depression. CSW met with Leslie Skinner and baby [name] at bedside to complete assessment. Leslie Skinner reports having been diagnosed with depression in 2017 with the loss of her mother. Leslie Skinner reports she gets by with the help of her friends and family. Leslie Skinner reports being able to control her symptoms by using self coping strategies and reaching out to her family, Leslie Skinner reports a great support system. Leslie Skinner receives Medicaid, WIC, and [employer]. Leslie Skinner reports a good mood since delivery without concerns. Leslie Skinner and CSW discussed postpartum depression versus baby blues period. CSW encouraged Leslie Skinner to reach out for assistance if needs arise, Leslie Skinner in agreement.  Madilyn Fireman, MSW, [place] Social Worker [facility] [phone]

## 2017-10-27 MED ORDER — SENNOSIDES-DOCUSATE SODIUM 8.6-50 MG PO TABS: 2.0000 | ORAL_TABLET | Freq: Every evening | ORAL | 0 refills | Status: DC | PRN

## 2017-10-27 MED ORDER — IBUPROFEN 600 MG PO TABS: 600.0000 mg | ORAL_TABLET | Freq: Four times a day (QID) | ORAL | 0 refills | Status: DC

## 2017-10-27 NOTE — Discharge Instructions (Signed)
Postpartum Care After Vaginal Delivery °The period of time right after you deliver your newborn is called the postpartum period. °What kind of medical care will I receive? °· You may continue to receive fluids and medicines through an IV tube inserted into one of your veins. °· If an incision was made near your vagina (episiotomy) or if you had some vaginal tearing during delivery, cold compresses may be placed on your episiotomy or your tear. This helps to reduce pain and swelling. °· You may be given a squirt bottle to use when you go to the bathroom. You may use this until you are comfortable wiping as usual. To use the squirt bottle, follow these steps: °? Before you urinate, fill the squirt bottle with warm water. Do not use hot water. °? After you urinate, while you are sitting on the toilet, use the squirt bottle to rinse the area around your urethra and vaginal opening. This rinses away any urine and blood. °? You may do this instead of wiping. As you start healing, you may use the squirt bottle before wiping yourself. Make sure to wipe gently. °? Fill the squirt bottle with clean water every time you use the bathroom. °· You will be given sanitary pads to wear. °How can I expect to feel? °· You may not feel the need to urinate for several hours after delivery. °· You will have some soreness and pain in your abdomen and vagina. °· If you are breastfeeding, you may have uterine contractions every time you breastfeed for up to several weeks postpartum. Uterine contractions help your uterus return to its normal size. °· It is normal to have vaginal bleeding (lochia) after delivery. The amount and appearance of lochia is often similar to a menstrual period in the first week after delivery. It will gradually decrease over the next few weeks to a dry, yellow-brown discharge. For most women, lochia stops completely by 6-8 weeks after delivery. Vaginal bleeding can vary from woman to woman. °· Within the first few  days after delivery, you may have breast engorgement. This is when your breasts feel heavy, full, and uncomfortable. Your breasts may also throb and feel hard, tightly stretched, warm, and tender. After this occurs, you may have milk leaking from your breasts. Your health care provider can help you relieve discomfort due to breast engorgement. Breast engorgement should go away within a few days. °· You may feel more sad or worried than normal due to hormonal changes after delivery. These feelings should not last more than a few days. If these feelings do not go away after several days, speak with your health care provider. °How should I care for myself? °· Tell your health care provider if you have pain or discomfort. °· Drink enough water to keep your urine clear or pale yellow. °· Wash your hands thoroughly with soap and water for at least 20 seconds after changing your sanitary pads, after using the toilet, and before holding or feeding your baby. °· If you are not breastfeeding, avoid touching your breasts a lot. Doing this can make your breasts produce more milk. °· If you become weak or lightheaded, or you feel like you might faint, ask for help before: °? Getting out of bed. °? Showering. °· Change your sanitary pads frequently. Watch for any changes in your flow, such as a sudden increase in volume, a change in color, the passing of large blood clots. If you pass a blood clot from your vagina, save it   to show to your health care provider. Do not flush blood clots down the toilet without having your health care provider look at them. °· Make sure that all your vaccinations are up to date. This can help protect you and your baby from getting certain diseases. You may need to have immunizations done before you leave the hospital. °· If desired, talk with your health care provider about methods of family planning or birth control (contraception). °How can I start bonding with my baby? °Spending as much time as  possible with your baby is very important. During this time, you and your baby can get to know each other and develop a bond. Having your baby stay with you in your room (rooming in) can give you time to get to know your baby. Rooming in can also help you become comfortable caring for your baby. Breastfeeding can also help you bond with your baby. °How can I plan for returning home with my baby? °· Make sure that you have a car seat installed in your vehicle. °? Your car seat should be checked by a certified car seat installer to make sure that it is installed safely. °? Make sure that your baby fits into the car seat safely. °· Ask your health care provider any questions you have about caring for yourself or your baby. Make sure that you are able to contact your health care provider with any questions after leaving the hospital. °This information is not intended to replace advice given to you by your health care provider. Make sure you discuss any questions you have with your health care provider. °Document Released: 12/24/2006 Document Revised: 08/01/2015 Document Reviewed: 01/31/2015 °Elsevier Interactive Patient Education © 2018 [employer] Inc. ° °

## 2017-10-27 NOTE — Discharge Summary (Signed)
OB Discharge Summary     Patient Name: Leslie Skinner DOB: 12-12-1995 MRN: [id number]  Date of admission: 10/23/2017 Delivering MD: Aletha Halim   Date of discharge: 10/27/2017  Admitting diagnosis: INDUCTION Intrauterine pregnancy: [redacted]w[redacted]d     Secondary diagnosis:  Active Problems:   Gestational hypertension A1GDM   Additional problems:  Pyelonephritis in pregnancy  History of migraine      Discharge diagnosis: Term Pregnancy Delivered                                                                                                Post partum procedures:none  Augmentation: AROM, Pitocin, Cytotec and Foley Balloon  Complications: None  Hospital course:  Induction of Labor With Vaginal Delivery   22 y.o. yo G1P1001 at [redacted]w[redacted]d was admitted to the hospital 10/23/2017 for induction of labor.  Indication for induction: A1 DM and cHTN.  Patient had an uncomplicated labor course as follows: Membrane Rupture Time/Date: 10:20 PM ,10/24/2017   Intrapartum Procedures: Episiotomy: None [1]                                         Lacerations:  Periurethral [8]  Patient had delivery of a Viable infant.  Information for the patient's newborn:  Leslie Skinner, Leslie Skinner [[id number]]  Delivery Method: Vaginal, Spontaneous(Filed from Delivery Summary)   10/25/2017  Details of delivery can be found in separate delivery note.  Patient had a routine postpartum course. Labetalol was discontinued PP. BPs within range. Denies symptoms of Pre-E. Patient is discharged home 10/27/17.  Physical exam  Vitals:   10/26/17 1452 10/26/17 2300 10/27/17 0541 10/27/17 0800  BP: 137/71 (!) 153/98 128/90 132/64  Pulse: 73 92 78 82  Resp: 18 18 18 18   Temp: 98.3 F (36.8 C) 98.4 F (36.9 C) 98.2 F (36.8 C) 97.7 F (36.5 C)  TempSrc: Oral Oral Axillary Oral  SpO2: 100%  100%    General: alert, cooperative and no distress Lochia: appropriate Uterine Fundus: firm Incision: N/A DVT Evaluation: No evidence  of DVT seen on physical exam. Labs: Lab Results  Component Value Date   WBC 16.7 (H) 10/26/2017   HGB 9.5 (L) 10/26/2017   HCT 29.5 (L) 10/26/2017   MCV 79.7 10/26/2017   PLT 195 10/26/2017   CMP Latest Ref Rng & Units 10/07/2017  Glucose 70 - 99 mg/dL 91  BUN 6 - 20 mg/dL 8  Creatinine 0.44 - 1.00 mg/dL 0.90  Sodium 135 - 145 mmol/L 133(L)  Potassium 3.5 - 5.1 mmol/L 3.9  Chloride 98 - 111 mmol/L 100  CO2 22 - 32 mmol/L 21(L)  Calcium 8.9 - 10.3 mg/dL 8.3(L)  Total Protein 6.5 - 8.1 g/dL 6.4(L)  Total Bilirubin 0.3 - 1.2 mg/dL 1.2  Alkaline Phos 38 - 126 U/L 209(H)  AST 15 - 41 U/L 33  ALT 0 - 44 U/L 22    Discharge instruction: per After Visit Summary and "Baby and Me Booklet".  After visit meds:  Allergies as  of 10/27/2017      Reactions   Morphine And Related Other (See Comments)   Hot flashes, can't breathe   Tramadol Other (See Comments)   Hot flashes       Medication List    STOP taking these medications   aspirin 81 MG tablet   cephALEXin 500 MG capsule Commonly known as:  KEFLEX   labetalol 100 MG tablet Commonly known as:  NORMODYNE     TAKE these medications   acetaminophen 500 MG tablet Commonly known as:  TYLENOL Take 1 tablet (500 mg total) by mouth every 6 (six) hours as needed. What changed:  reasons to take this   ibuprofen 600 MG tablet Commonly known as:  ADVIL,MOTRIN Take 1 tablet (600 mg total) by mouth every 6 (six) hours.   PRENATAL COMPLETE 14-0.4 MG Tabs Take 1 tablet by mouth daily.   senna-docusate 8.6-50 MG tablet Commonly known as:  Senokot-S Take 2 tablets by mouth at bedtime as needed for mild constipation.       Diet: routine diet  Activity: Advance as tolerated. Pelvic rest for 6 weeks.   Outpatient follow up:4 weeks Follow up Appt:No future appointments. Follow up Visit:No follow-ups on file.  Postpartum contraception: Depo Provera  Newborn Data: Live born female  Birth Weight: 7 lb 7.4 oz (3385 g) APGAR:  6, 8  Newborn Delivery   Birth date/time:  10/25/2017 12:31:00 Delivery type:  Vaginal, Spontaneous     Baby Feeding: Bottle and Breast Disposition:home with mother   10/27/2017 Melina Schools, DO

## 2017-10-27 NOTE — Lactation Note (Signed)
This note was copied from a baby's chart. Lactation Consultation Note  Patient Name: Leslie Skinner Date: 10/27/2017 Reason for consult: Follow-up assessment;1st time breastfeeding;Term;Difficult latch P1, 36 hrs infant, weight loss -4% LC entered room , family member had finished giving infant 22 ml of formula 5 minutes before LC entered room. Per mom, she is  still interested in BF her baby. LC noticed mom has flat nipples . Mom was fitted w/ 20 mm NS. Mom attempted to latch infant on right breast w/ NS but infant kept latching off breast- latch score 6. [place] taught mom hand expression, which she taught back and expressed 2 ml of breast milk that was given to infant by spoon. LC encouraged mom to use DEBP and give infant her EBM. Mom receptive of pumping and pumped 3 mls of breast milk which she will offer to  infant at next feeding. Mom shown how to use DEBP & how to disassemble, clean, & reassemble parts. Mom plans to work towards latching infant to breast w/ NS, plans to use DEBP and give infant any EBM after pumping. Mom goal is to breast feed more and offer less formula.   Maternal Data Has patient been taught Hand Expression?: Yes(LC and mom taught back) Does the patient have breastfeeding experience prior to this delivery?: No  Feeding Feeding Type: Breast Fed Nipple Type: Slow - flow  LATCH Score Latch: Repeated attempts needed to sustain latch, nipple held in mouth throughout feeding, stimulation needed to elicit sucking reflex.  Audible Swallowing: A few with stimulation  Type of Nipple: Flat  Comfort (Breast/Nipple): Soft / non-tender  Hold (Positioning): Assistance needed to correctly position infant at breast and maintain latch.  LATCH Score: 6  Interventions Interventions: Breast feeding basics reviewed;Assisted with latch;Support pillows;Breast massage;Hand express;Expressed milk;DEBP  Lactation Tools Discussed/Used Tools: Nipple Shields Nipple  shield size: 20 WIC Program: Yes Pump Review: Setup, frequency, and cleaning Initiated by:: Vicente Serene, IBCLC Date initiated:: 10/27/17   Consult Status Consult Status: Follow-up Date: 10/27/17 Follow-up type: In-patient    Vicente Serene 10/27/2017, 12:49 AM

## 2017-10-27 NOTE — Lactation Note (Signed)
This note was copied from a baby's chart. Lactation Consultation Note  Patient Name: Leslie Skinner [id number] Date: 10/27/2017 Reason for consult: Follow-up assessment;Term Mom just finished giving baby formula.  She pumped 10 mls of colostrum earlier.  Instructed to give any expressed milk back to baby.  Instructed mom to watch for feeding cues and call for latch assist.  Mom agreeable.  Maternal Data    Feeding    LATCH Score                   Interventions    Lactation Tools Discussed/Used     Consult Status Consult Status: Follow-up Date: 10/28/17 Follow-up type: In-patient    Ave Filter 10/27/2017, 9:35 AM

## 2017-10-27 NOTE — Lactation Note (Signed)
This note was copied from a baby's chart. Lactation Consultation Note  Patient Name: Leslie Skinner Date: 10/27/2017 Reason for consult: Follow-up assessment;Difficult latch Mom requesting latch assist.  Baby cueing to feed.  Assisted postioning baby in football hold on right.  Baby attempted to latch but unable to grasp tissue.  20 mm nipple shield applied and filled with colostrum.  Baby latched and fed well for 10 minutes and then became fussy.  Mom decided to pump with DEBP while FOB gives baby bottle.  FOB supportive.  Instructed to continue to latch baby using shield with cues and post pump x 15 minutes.  Encouraged to call for assist prn.  Maternal Data    Feeding Feeding Type: Breast Fed  LATCH Score Latch: Repeated attempts needed to sustain latch, nipple held in mouth throughout feeding, stimulation needed to elicit sucking reflex.  Audible Swallowing: A few with stimulation  Type of Nipple: Flat  Comfort (Breast/Nipple): Soft / non-tender  Hold (Positioning): Assistance needed to correctly position infant at breast and maintain latch.  LATCH Score: 6  Interventions Interventions: Assisted with latch;Breast compression;Adjust position;Breast massage;Support pillows;Hand express;DEBP  Lactation Tools Discussed/Used Tools: Nipple Shields Nipple shield size: 20   Consult Status Consult Status: Follow-up Date: 10/28/17 Follow-up type: In-patient    Ave Filter 10/27/2017, 3:31 PM

## 2017-10-28 ENCOUNTER — Ambulatory Visit: Payer: Self-pay

## 2017-10-28 NOTE — Lactation Note (Signed)
This note was copied from a baby's chart. Lactation Consultation Note  Patient Name: Leslie Skinner [id number] Date: 10/28/2017 Reason for consult: Follow-up assessment;Infant weight loss;Difficult latch Type of Endocrine Disorder?: Diabetes  Baby is 44 hours old and after LC saw mom yesterday, has only given bottles.  And per mom  has not pumped.  Per mom still has a desire to breast feed her baby and work on it.  LC reviewed supply and demand and the importance of of the first 2 weeks either latching or pumping.  Per mom active with WIC / [place]. LC recommended she call them for a DEBP and [place] faxed a request for a DEBP.  Mom denies sore nipples. Sore nipples and engorgement prevention and tx. LC also recommended since the baby has had so many bottles , important to work on getting  The milk in. In the mean time if the baby isn't to hungry to attempt to latch with the  #20 NS fitted yesterday, feed , supplement, post pump after at least 5-6 feedings a day for 10 -15 mns  ,save milk.   Mom pumped at consult with the DEBP #24 F and it was comfortable with fit.  LC instructed mom on the use shells  wear them between feedings except when sleeping.   [place] faxed the [facility] DEBP request.  Mom has her DEBP kit for D/C  LC offered to place request in the [facility] clinic basket for [facility] appt. And mom consented,  And is aware she will receive a call.   Mother informed of post-discharge support and given phone number to the lactation department, including services for phone call assistance; out-patient appointments; and breastfeeding support group. List of other breastfeeding resources in the community given in the handout. Encouraged mother to call for problems or concerns related to breastfeeding.      Maternal Data    Feeding Feeding Type: Bottle Fed - Formula Nipple Type: Slow - flow  LATCH Score                   Interventions Interventions: Breast feeding basics  reviewed  Lactation Tools Discussed/Used Tools: Shells Shell Type: Inverted Breast pump type: Manual;Double-Electric Breast Pump WIC Program: Yes(LC faxed a request for DEBP ) Pump Review: Setup, frequency, and cleaning   Consult Status Consult Status: Follow-up Date: ([place] offered mom to place request in the [place] for [facility] clinic to call mom ) Follow-up type: [place] 10/28/2017, 10:19 AM

## 2017-10-31 ENCOUNTER — Ambulatory Visit: Payer: Self-pay

## 2017-11-07 ENCOUNTER — Ambulatory Visit: Payer: Medicaid Other | Admitting: Family Medicine

## 2017-11-13 ENCOUNTER — Ambulatory Visit: Payer: Self-pay | Admitting: Medical

## 2017-11-13 NOTE — BH Specialist Note (Deleted)
Integrated Behavioral Health Follow Up Visit  MRN: [id number] Name: Leslie Skinner  Number of [place] Clinician visits: {IBH Number of Visits:21014052} Session Start time: ***  Session End time: *** Total time: {IBH Total Time:21014050}  Type of Service: [place] Interpretor:{yes [id number]} Interpretor Name and Language: ***  SUBJECTIVE: Leslie Skinner is a 22 y.o. female accompanied by {Patient accompanied by:[phone]} Patient was referred by *** for ***. Patient reports the following symptoms/concerns: *** Duration of problem: ***; Severity of problem: {Mild/Moderate/Severe:20260}  OBJECTIVE: Mood: {BHH MOOD:22306} and Affect: {BHH AFFECT:22307} Risk of harm to self or others: {CHL AMB BH Suicide Current Mental Status:21022748}  LIFE CONTEXT: Family and Social: *** School/Work: *** Self-Care: *** Life Changes: ***  GOALS ADDRESSED: Patient will: 1.  Reduce symptoms of: {IBH Symptoms:21014056}  2.  Increase knowledge and/or ability of: {IBH Patient Tools:21014057}  3.  Demonstrate ability to: {IBH Goals:21014053}  INTERVENTIONS: Interventions utilized:  {IBH Interventions:21014054} Standardized Assessments completed: {IBH Screening Tools:21014051}  ASSESSMENT: Patient currently experiencing ***.   Patient may benefit from ***.  PLAN: 1. Follow up with behavioral health clinician on : *** 2. Behavioral recommendations: *** 3. Referral(s): {IBH Referrals:21014055} 4. "From scale of 1-10, how likely are you to follow plan?": ***  Caroleen Hamman McMannes, LCSW

## 2017-11-14 ENCOUNTER — Ambulatory Visit (INDEPENDENT_AMBULATORY_CARE_PROVIDER_SITE_OTHER): Payer: Self-pay | Admitting: Clinical

## 2017-11-14 ENCOUNTER — Ambulatory Visit (INDEPENDENT_AMBULATORY_CARE_PROVIDER_SITE_OTHER): Payer: Medicaid Other | Admitting: Obstetrics and Gynecology

## 2017-11-14 ENCOUNTER — Encounter: Payer: Self-pay | Admitting: Obstetrics and Gynecology

## 2017-11-14 VITALS — BP 124/89 | HR 96 | Ht 62.0 in | Wt 218.4 lb

## 2017-11-14 DIAGNOSIS — F320 Major depressive disorder, single episode, mild: Secondary | ICD-10-CM | POA: Insufficient documentation

## 2017-11-14 DIAGNOSIS — O10919 Unspecified pre-existing hypertension complicating pregnancy, unspecified trimester: Secondary | ICD-10-CM

## 2017-11-14 DIAGNOSIS — R102 Pelvic and perineal pain: Secondary | ICD-10-CM

## 2017-11-14 DIAGNOSIS — F4323 Adjustment disorder with mixed anxiety and depressed mood: Secondary | ICD-10-CM

## 2017-11-14 HISTORY — DX: Major depressive disorder, single episode, mild: F32.0

## 2017-11-14 LAB — POCT URINALYSIS DIP (DEVICE)
Bilirubin Urine: NEGATIVE
Glucose, UA: NEGATIVE mg/dL
KETONES UR: NEGATIVE mg/dL
Nitrite: NEGATIVE
Protein, ur: 30 mg/dL — AB
SPECIFIC GRAVITY, URINE: 1.025 (ref 1.005–1.030)
UROBILINOGEN UA: 0.2 mg/dL (ref 0.0–1.0)
pH: 6 (ref 5.0–8.0)

## 2017-11-14 MED ORDER — MICONAZOLE NITRATE 2 % VA CREA: 1.0000 | TOPICAL_CREAM | Freq: Every day | VAGINAL | 2 refills | Status: DC

## 2017-11-14 MED ORDER — SERTRALINE HCL 50 MG PO TABS: 50.0000 mg | ORAL_TABLET | Freq: Every day | ORAL | 1 refills | Status: DC

## 2017-11-14 MED ORDER — ENALAPRIL MALEATE 5 MG PO TABS: 5.0000 mg | ORAL_TABLET | Freq: Every day | ORAL | 2 refills | Status: DC

## 2017-11-14 NOTE — Progress Notes (Signed)
Obstetrics and Gynecology Visit Return Patient Evaluation  Appointment Date: 11/14/2017  Primary Care Provider: [place], [place] for [facility] [place]  Chief Complaint: BP follow up, VB, vaginal pain  History of Present Illness:  Leslie Skinner is a 22 y.o. G1P1 s/p 8/16 SVD/intact perineum (left periuretheral repaired). She was discharged to home on 8/18. She no show'ed to her one week BP visit; she was not discharged to home on any bp meds and was on labetalol during the pregnancy.   +vaginal itching.    Review of Systems: as noted in the History of Present Illness.  Medications:  Leslie Skinner had no medications administered during this visit. Current Outpatient Medications  Medication Sig Dispense Refill  . Prenatal Vit-Fe Fumarate-FA (PRENATAL COMPLETE) 14-0.4 MG TABS Take 1 tablet by mouth daily. 60 each 3  . senna-docusate (SENOKOT-S) 8.6-50 MG tablet Take 2 tablets by mouth at bedtime as needed for mild constipation. 20 tablet 0  . acetaminophen (TYLENOL) 500 MG tablet Take 1 tablet (500 mg total) by mouth every 6 (six) hours as needed. (Patient not taking: Reported on 11/14/2017) 30 tablet 0   No current facility-administered medications for this visit.     Allergies: is allergic to morphine and related and tramadol.  Physical Exam:  1st BP 143/91 BP 124/89   Pulse 96   Ht 5\' 2"  (1.575 m)   Wt 218 lb 6.4 oz (99.1 kg)   LMP 01/23/2017   BMI 39.95 kg/m  Body mass index is 39.95 kg/m. General appearance: Well nourished, well developed female in no acute distress.  Abdomen: diffusely non tender to palpation, non distended, and no masses, hernias Neuro/Psych:  Normal mood and affect.    Pelvic exam:  EGBUS: mildly atrophic and some b/l erythema.  Nearly healed spot next to leftside of urethera. No e/o infection or separation    Assessment: pp depression. Pt stable  Plan:  1. Chronic hypertension during pregnancy, antepartum Pt  states she was lisinopril prior to pregnancy. Will start enalapril since safe for breastfeeding.  - Culture, OB Urine  2. Current mild episode of major depressive disorder, unspecified whether recurrent (Leslie Skinner) Pt lives with her sister and the sister's 6 y/o. FOB not really involved and patient states she doesn't really leave the house much. She's got some additional stress b/c she is wondering about childcare after 6wks. I d/w her re: r/b/a with starting anti-depressants and she'd like to start. Patient contracts for safety and will see back in a few weeks. Also d/w her that will need to rely on as much family help as possible. Sertraline prescribed  3. Vaginal discomfort Normal. Will send ucx since h/o pyelo during pregnancy. Monistat 7 ordered  4. GDM vs DM2 Pt states she was on metformin and check her BS tid prior to pregnancy. Pt told to start with BS checks and will follow up at next visit, as breastfeeding can help with BS control.   5. PCP Seen by [facility]. Pt told to call them for a regular visit for later this year to re-establish care   RTC: 2wks  Durene Romans MD Attending Center for [facility])

## 2017-11-14 NOTE — Patient Instructions (Signed)
Start the enalapril for blood pressure control Start the sertraline to help with your mood Check your blood sugars like before you were pregnant Call your primary care doctor to make a regular visit for later this year

## 2017-11-14 NOTE — BH Specialist Note (Signed)
Integrated Behavioral Health Initial Visit  MRN: [id number] Name: Leslie Skinner  Number of [place] Clinician visits:: 1/6 Session Start time: 10: 20 Session End time: 10:43 Total time: 20 minutes  Type of Service: [place] Interpretor:No. Interpretor Name and Language: n/a   Warm Hand Off Completed.       SUBJECTIVE: Leslie Skinner is a 22 y.o. female accompanied by newborn son Patient was referred by Emeterio Reeve, MD for depression postpartum  Patient reports the following symptoms/concerns: Pt states she is continuing to have symptoms of depression postpartum, that started in pregnancy. Pt has used self coping strategies that have worked temporarily to alleviate symptoms, but symptoms remain persistent, including anhedonia, fatigue,  depression, isolation, anxiety and worry.  Duration of problem: Began in pregnancy; previously experienced as a teen; Severity of problem: moderately severe  OBJECTIVE: Mood: Depressed and Affect: Depressed and Tearful Risk of harm to self or others: No plan to harm self or others  LIFE CONTEXT: Family and Social: Pt lives with her newborn son; family and friends are supportive, offering practical support routinely School/Work: Pt plans to return to work Self-Care: Using self coping strategies daily Life Changes: Recent childbirth  GOALS ADDRESSED: Patient will: 1. Reduce symptoms of: anxiety, depression and stress 2. Increase knowledge and/or ability of: stress reduction  3. Demonstrate ability to: Increase healthy adjustment to current life circumstances and Increase adequate support systems for patient/family  INTERVENTIONS: Interventions utilized: Supportive Counseling, Psychoeducation and/or Health Education and Link to [employer]  Standardized Assessments completed: Not Needed  ASSESSMENT: Patient currently experiencing Adjustment disorder with mixed anxious and  depressed mood   Patient may benefit from continued brief therapeutic interventions regarding coping with symptoms of anxiety and depression.  PLAN: 1. Follow up with behavioral health clinician on : One week by phone; two weeks in office 2. Behavioral recommendations:  -Begin taking Zoloft, as prescribed by medical providers -Continue using self coping strategies that have helped in the past -Attend Mom Talk/Breastfeeding Support social support groups next Tuesday (and continue weekly, as long as group remains helpful) -Continue allowing family members to give practical support; consider outing with sister and newborn son within one week -Read educational materials regarding how antidepressants work in the body  3. Referral(s): [place] (In Clinic) and [place] (LME/Outside Clinic) 4. "From scale of 1-10, how likely are you to follow plan?": 8  [name], LCSW  Depression screen [facility] 2/9 10/21/2017 10/02/2017 09/18/2017 09/04/2017 08/26/2017  Decreased Interest 1 2 2 1 1   Down, Depressed, Hopeless 1 2 1  - 0  PHQ - 2 Score 2 4 3 1 1   Altered sleeping 2 2 3 1 1   Tired, decreased energy 0 2 1 1 2   Change in appetite 1 2 1 1 1   Feeling bad or failure about yourself  0 0 0 0 0  Trouble concentrating 0 0 0 0 0  Moving slowly or fidgety/restless 0 0 0 0 0  Suicidal thoughts 0 0 0 0 0  PHQ-9 Score 5 10 8 4 5   Difficult doing work/chores - - - Somewhat difficult -  Some recent data might be hidden   GAD 7 : Generalized Anxiety Score 10/21/2017 10/02/2017 09/18/2017 09/04/2017  Nervous, Anxious, on Edge 1 1 2  0  Control/stop worrying 1 1 1  0  Worry too much - different things 1 1 2  0  Trouble relaxing 1 1 1  0  Restless 0 0  0 0  Easily annoyed or irritable 1 3 2 3   Afraid - awful might happen 0 0 0 0  Total GAD 7 Score 5 7 8 3   Anxiety Difficulty - - - Somewhat difficult

## 2017-11-14 NOTE — Progress Notes (Signed)
Pt states Bleeding has stopped as of yesterday. Pt states has had the pain in her vagina since delivery, hurts mostly when she's sitting down.Pt has been taking Ibuprofen & applying ice but does not help.

## 2017-11-16 LAB — CULTURE, OB URINE

## 2017-11-16 LAB — URINE CULTURE, OB REFLEX

## 2017-11-18 ENCOUNTER — Encounter: Payer: Self-pay | Admitting: *Deleted

## 2017-11-19 ENCOUNTER — Telehealth: Payer: Self-pay | Admitting: Clinical

## 2017-11-19 NOTE — Telephone Encounter (Signed)
Integrated Behavioral Health Medication Management Phone Note  Left HIPPA-compliant message to call back [name] from [facility] for [place] [facility]  at [phone].    Caroleen Hamman McMannes, LCSW

## 2017-11-27 ENCOUNTER — Ambulatory Visit: Payer: Self-pay | Admitting: Obstetrics and Gynecology

## 2017-11-27 ENCOUNTER — Encounter: Payer: Self-pay | Admitting: Obstetrics and Gynecology

## 2017-11-27 NOTE — BH Specialist Note (Deleted)
Integrated Behavioral Health Follow Up Visit  MRN: [id number] Name: Leslie Skinner  Number of [place] Clinician visits: 3/6 Session Start time: ***  Session End time: *** Total time: {IBH Total Time:21014050}  Type of Service: [place] Interpretor:No. Interpretor Name and Language: n/a  SUBJECTIVE: Leslie Skinner is a 22 y.o. female accompanied by {Patient accompanied OJ:[id number]} Patient was referred by Aletha Halim, MD for ***. Patient reports the following symptoms/concerns: *** Duration of problem: ***; Severity of problem: {Mild/Moderate/Severe:20260}  OBJECTIVE: Mood: {BHH MOOD:22306} and Affect: {BHH AFFECT:22307} Risk of harm to self or others: {CHL AMB BH Suicide Current Mental Status:21022748}  LIFE CONTEXT: Family and Social: Pt lives with her newborn son; family and friends remain supportive *** School/Work: Pt plans to return to work *** Self-Care: Using self coping strategies daily *** Life Changes: Recent childbirth ***  GOALS ADDRESSED: Patient will: 1.  Reduce symptoms of: {IBH Symptoms:21014056}  2.  Increase knowledge and/or ability of: {IBH Patient Tools:21014057}  3.  Demonstrate ability to: {IBH Goals:21014053}  INTERVENTIONS: Interventions utilized:  {IBH Interventions:21014054} Standardized Assessments completed: [place] Postnatal Depression, GAD-7 and PHQ 9 ***  ASSESSMENT: Patient currently experiencing ***.   Patient may benefit from continued brief therapeutic interventions regarding coping with symptoms of anxiety and depression ***.  PLAN: 1. Follow up with behavioral health clinician on : *** 2. Behavioral recommendations: *** 3. Referral(s): {IBH Referrals:21014055} 4. "From scale of 1-10, how likely are you to follow plan?": ***  Caroleen Hamman McMannes, LCSW

## 2017-11-28 NOTE — Progress Notes (Signed)
Patient did not keep her OB appointment for 11/27/2017.  Durene Romans MD Attending Center for [employer] farm manager)

## 2017-12-06 ENCOUNTER — Ambulatory Visit (INDEPENDENT_AMBULATORY_CARE_PROVIDER_SITE_OTHER): Payer: Medicaid Other | Admitting: *Deleted

## 2017-12-06 DIAGNOSIS — Z111 Encounter for screening for respiratory tuberculosis: Secondary | ICD-10-CM | POA: Diagnosis present

## 2017-12-06 NOTE — Progress Notes (Signed)
Tuberculin skin test applied to left ventral forearm @ 3:20pm on friday.  She will return on Monday @ 8:30 to have test read. She will need letter faxed to work.  She will provide number in Monday. Korynne Dols, Salome Spotted, CMA

## 2017-12-09 ENCOUNTER — Ambulatory Visit (INDEPENDENT_AMBULATORY_CARE_PROVIDER_SITE_OTHER): Payer: Medicaid Other | Admitting: *Deleted

## 2017-12-09 DIAGNOSIS — Z111 Encounter for screening for respiratory tuberculosis: Secondary | ICD-10-CM

## 2017-12-09 LAB — TB SKIN TEST
Induration: 0 mm
TB SKIN TEST: NEGATIVE

## 2017-12-09 NOTE — Progress Notes (Signed)
Patient is here for a PPD read.  It was placed on 12/06/2017 in the left forearm @ 10 am.    PPD RESULTS:  Result: Negative Induration: 0 mm  Letter created and faxed to Work [phone]) as requested for documentation purposes. Adhvik Canady, Salome Spotted, CMA

## 2017-12-23 ENCOUNTER — Ambulatory Visit: Payer: Self-pay | Admitting: Family Medicine

## 2018-01-13 ENCOUNTER — Other Ambulatory Visit: Payer: Self-pay | Admitting: Family Medicine

## 2018-01-13 ENCOUNTER — Ambulatory Visit: Payer: Medicaid Other | Admitting: Family Medicine

## 2018-01-13 NOTE — Telephone Encounter (Signed)
Pt is needing a refill on her birth control patches to be called in. She was  Late for her appointment today because of her job. Can we call these today. jw

## 2018-01-14 NOTE — Telephone Encounter (Signed)
Called and LVM for patient to call the pharmacy or the physician who prescribed her [facility] patches since it is not on her medication list per Dr. Kris Mouton.  Leslie Skinner, [place]

## 2018-01-14 NOTE — Telephone Encounter (Signed)
Patient does not have these on her medication list. Can we ask the patient which patches she uses, or have her call her pharmacy. It looks like she usually sees women's health so calling her OB might also be an option.  Guadalupe Dawn MD PGY-2 Family Medicine Resident

## 2018-01-21 ENCOUNTER — Ambulatory Visit: Payer: Medicaid Other | Admitting: Family Medicine

## 2018-05-29 ENCOUNTER — Telehealth: Payer: Self-pay | Admitting: Family Medicine

## 2018-05-29 MED ORDER — NAPROXEN 500 MG PO TABS: 500.0000 mg | ORAL_TABLET | Freq: Two times a day (BID) | ORAL | 0 refills | Status: DC

## 2018-05-29 NOTE — Telephone Encounter (Signed)
Received phone message for this patient with concerns of needing work note. Leslie Skinner states the sxs are back pain 2/2 heavy lifting at work. Patient states that she has a history of scoliosis and new job is causing her increased back pain.  Started new job 2 days ago and noticed that ever since she has had extreme back pain.  Has had a history of this in the past but this was resolved after starting light duty.  States that she needs a doctor's note to transfer her from heavy lifting to light duty.  Patient also is requesting pain medicine at this time.  Given concern of COVID-19 that patient to be seen till late April. Will given RX for naproxen as well as note for work restriction. Advised patient to call and schedule f/u appointment in 1 month.   Caroline More, DO  PGY-2

## 2018-06-03 ENCOUNTER — Telehealth: Payer: Self-pay | Admitting: Student in an Organized Health Care Education/Training Program

## 2018-06-03 NOTE — Telephone Encounter (Signed)
Patient called after letter faxed to her job about light lifting, patient states that she can do heavy lifting just wanted to see about getting meds for her back pain instead of having restrictions at work. Please call patient back.

## 2018-06-03 NOTE — Telephone Encounter (Signed)
S:  Patient complains of abnormal, foul smelling urine x 2 weeks.  Endorses frequency and urgency and sensation of inability to fully empty bladder. Denies blood in urine but states that is is very dark and cloudy.  Denies change in discharge but states that she has a chronic grey mucous discharge. She is sexually active without barrier protection but denies concerns for STIs. Patient is unsure of specific diagnoses in past but stressed that she has frequent, BAD urinary or vaginal infections and this is how they always start out.  She was treated for a bacterial infection while pregnant over 6 months ago but did not complete the treatment due to inability to tolerate- medication made her sick. She does not know if she has a history of pyelonephritis.  She endorses current right-sided intermittent lower back pain but is not different from her chronic pain.  The pain is not resolved by naproxen which she was prescribed at her last PCP appointment.    AP: UTI vs. BV- with mixed picture symptoms  - recommended patient come in today for urinalysis and wet prep for therapy decision but she is unable to do so until Friday morning. She feels that she is okay to wait until then to be seen. - made ATC appointment Friday

## 2018-06-04 ENCOUNTER — Encounter: Payer: Self-pay | Admitting: Family Medicine

## 2018-06-04 ENCOUNTER — Telehealth: Payer: Self-pay

## 2018-06-04 NOTE — Telephone Encounter (Signed)
Please inform patient letter has been written. I have routed it to [place], DO, PGY-2 [place] [facility] 06/04/2018 10:42 AM

## 2018-06-04 NOTE — Telephone Encounter (Signed)
Called and spoke to patient and she now wants a letter stating that she is cleared to work.  She says that while she did initially state that she wanted a note stating" light lifting restrictions, it was only for when her back acts up."  Patient states that she needs her job and that with current letter, job is telling her that she can not work.  Patient will call back with number to fax to her job as she did not have it available.  [name], [place]

## 2018-06-04 NOTE — Telephone Encounter (Signed)
Pt is calling again to ask if Dr. [name] Klippel will be willing to rewrite the note stating that she can now with without restrictions.   Please call pt to discuss if this is possible. Pt states she would like to report back to work today 06/04/18 at 2:00pm.

## 2018-06-04 NOTE — Telephone Encounter (Signed)
Per my discussion with patient patient clearly stated that she wanted a work note to have restriction from heavy lifting. I gave her RX for naproxen (which is a pain medication). I informed patient at that call that if she continued to have back pain she may need to be seen or call in for separate telemedicine visit. Please inform patient that I cannot give new RX for opiods over the phone and she will need to call in for separate telemedicine visit or be seen if back pain is that severe.   Dalphine Handing, PGY-2 [facility] 06/04/2018 8:43 AM

## 2018-06-06 ENCOUNTER — Ambulatory Visit: Payer: Self-pay

## 2018-06-27 ENCOUNTER — Other Ambulatory Visit: Payer: Self-pay

## 2018-06-27 ENCOUNTER — Telehealth (INDEPENDENT_AMBULATORY_CARE_PROVIDER_SITE_OTHER): Payer: Self-pay | Admitting: Family Medicine

## 2018-06-27 DIAGNOSIS — B9689 Other specified bacterial agents as the cause of diseases classified elsewhere: Secondary | ICD-10-CM

## 2018-06-27 DIAGNOSIS — N760 Acute vaginitis: Secondary | ICD-10-CM

## 2018-06-27 MED ORDER — METRONIDAZOLE 500 MG PO TABS: 500.0000 mg | ORAL_TABLET | Freq: Two times a day (BID) | ORAL | 0 refills | Status: AC

## 2018-06-27 MED ORDER — FLUCONAZOLE 150 MG PO TABS: 150.0000 mg | ORAL_TABLET | Freq: Once | ORAL | 0 refills | Status: AC

## 2018-06-27 NOTE — Progress Notes (Signed)
[place] Telemedicine Visit  Patient consented to have virtual visit. Method of visit: Telephone  Encounter participants: Patient: Leslie Skinner - located at work Provider: Annabell Sabal - located at office Others (if applicable): n/a   Chief Complaint: vaginal discharge  HPI:  Patient initially concerned she had a UTI because she had a strong smell when urinating.  However after talking more with her she is actually complaining of vaginal discharge.  She has had to change her underwear several times a day due to increased discharge.  She has a history of BV in the past.  She had a recent diagnosis but did not finish taking the Flagyl for this.  She has had no sexual intercourse of the past few months.  No dysuria.  No urinary frequency or hesitancy.  No back pain.  No fevers or chills.  No itching or burning.  Foul odor and thin discharge remain symptoms.  She has no curd or cottage cheeselike discharge.  She is also asking for Diflucan as Flagyl in the past has given her yeast infection.  ROS: per HPI  Pertinent PMHx: Patient has diagnosis of prediabetes as well as prior history of gestational diabetes.  Exam: Gen:  Patient awake, alert, fully conversant, oriented x 4 Auditory:  Hearing normal Resp:  Good strong voice.  Speaking in full sentences.  No coughing during exam.  No respiratory distress.  No audible wheezing over the phone  Psych:  Linear and coherent thought process.  No flight of ideas.    Assessment/Plan:  1.  Bacterial vaginosis:  -diagnosed based on symptoms. -Low risk for STDs. -Discussed this was not urinary tract infection based on symptoms.  Will treat with Flagyl. - Also presumptive treatment for yeast infection as history of this.  To take Diflucan after finishing her Flagyl.  Time spent on phone with patient: 9 minutes

## 2018-08-05 ENCOUNTER — Ambulatory Visit (INDEPENDENT_AMBULATORY_CARE_PROVIDER_SITE_OTHER): Payer: Self-pay | Admitting: Family Medicine

## 2018-08-05 ENCOUNTER — Other Ambulatory Visit: Payer: Self-pay

## 2018-08-05 VITALS — BP 110/65 | Wt 228.0 lb

## 2018-08-05 DIAGNOSIS — M546 Pain in thoracic spine: Secondary | ICD-10-CM

## 2018-08-05 DIAGNOSIS — G8929 Other chronic pain: Secondary | ICD-10-CM

## 2018-08-05 HISTORY — DX: Other chronic pain: G89.29

## 2018-08-05 MED ORDER — MELOXICAM 15 MG PO TABS: 15.0000 mg | ORAL_TABLET | Freq: Every day | ORAL | 0 refills | Status: AC

## 2018-08-05 NOTE — Progress Notes (Signed)
Subjective:    Patient ID: Leslie Skinner, female    DOB: 18-Jun-1995, 23 y.o.   MRN: [id number]   CC: Back pain  HPI: Patient is a 23 year old female with past medical history significant for chronic back pain, and reported scoliosis who presents today complaining of back pain.  Patient reports that pain started on Friday is mostly located in the middle of her back radiating up to her shoulders.  Patient reports that she was told when she was younger she had scoliosis and was seen at [facility].  Patient had an MRI 10 years ago but is unable to recall results.  She has not seen her orthopedic or back specialist in many years.  Currently she has been taking naproxen for pain 1-2 times a day with minimal relief in her symptoms.  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  Patient has missed work on Friday and yesterday.  Patient denies any radiation to her legs or lower back.  Patient denies any urinary or bowel incontinence.  No recent fall or trauma.  She seems to be chronic with intermittent flareups.   Smoking status reviewed   ROS: all other systems were reviewed and are negative other than in the HPI   Past Medical History:  Diagnosis Date  . Acanthosis nigricans, acquired   . Constipation   . Diabetes mellitus type II   . GERD (gastroesophageal reflux disease)   . Goiter   . Hypertension   . Migraines   . Obesity   . Osteochondroma of lower leg   . Pollen allergies   . Scoliosis     Past Surgical History:  Procedure Laterality Date  . LOWER LEG SOFT TISSUE TUMOR EXCISION  2011  . OSTEOCHONDROMA EXCISION      Past medical history, surgical, family, and social history reviewed and updated in the EMR as appropriate.  Objective:  BP 110/65   Wt 228 lb (103.4 kg)   LMP 07/30/2018   BMI 41.70 kg/m   Vitals and nursing note reviewed  General: NAD, pleasant, able to participate in exam Cardiac: RRR, normal heart sounds, no murmurs. 2+ radial and PT  pulses bilaterally Respiratory: CTAB, normal effort, No wheezes, rales or rhonchi Abdomen: soft, nontender, nondistended, no hepatic or splenomegaly, +BS  Back exam: No lumbar lordosis, thoracic kyphosis, scoliosis, pelvic assymmetry/tilt. TTP around T5-T6, and paraspinal muscle. Limited lumbar flexion/extension, strength Hip ( adduction, flexion), normal straight leg raise, FABER, patellar and achilles reflex intact.  Extremities: no edema or cyanosis. WWP. Skin: warm and dry, no rashes noted Neuro: alert and oriented x4, no focal deficits Psych: Normal affect and mood   Assessment & Plan:   Chronic bilateral thoracic back pain Patient presents with 4 days of thoracic back pain not responsive to NSAIDs.  This seems to be a chronic problem that patient has been attributed to scoliosis.  Upon further reviewing of patient chart she was seen  last [facility] in 2012 MRI of her lumbar thoracic and cervical spine did not show any evidence of scoliosis or kyphosis.  On exam she has limited extension and flexion and is mildly tender to palpation along T5-T6.  She is well-appearing with no radiation to lower extremity or upper extremity.  Given symptoms chronicity patient will likely benefit from physical therapy.  We will switch NSAID from naproxen to meloxicam for 7 days.  Patient is not breast-feeding anymore and has switched to formula for the past week.  She is also not taking sertraline anymore.  Patient also given home exercise program for back pain.  Recommended using heating pad in addition to NSAIDs.  We will also put a referral for physical therapy.  Letter for work provided.  She will follow-up on a PRN basis.    Marjie Skiff, MD [place] PGY-3

## 2018-08-05 NOTE — Assessment & Plan Note (Signed)
Patient presents with 4 days of thoracic back pain not responsive to NSAIDs.  This seems to be a chronic problem that patient has been attributed to scoliosis.  Upon further reviewing of patient chart she was seen  last [facility] in 2012 MRI of her lumbar thoracic and cervical spine did not show any evidence of scoliosis or kyphosis.  On exam she has limited extension and flexion and is mildly tender to palpation along T5-T6.  She is well-appearing with no radiation to lower extremity or upper extremity.  Given symptoms chronicity patient will likely benefit from physical therapy.  We will switch NSAID from naproxen to meloxicam for 7 days.  Patient is not breast-feeding anymore and has switched to formula for the past week.  She is also not taking sertraline anymore.  Patient also given home exercise program for back pain.  Recommended using heating pad in addition to NSAIDs.  We will also put a referral for physical therapy.  Letter for work provided.  She will follow-up on a PRN basis.

## 2018-08-05 NOTE — Patient Instructions (Signed)
It was great seeing you today! We have addressed the following issues today  1. Use Meloxicam for about a week. Stop taking naproxen. 2. Use a heating pad. 3. I refer you to Physical therapy.  If we did any lab work today, and the results require attention, either me or my nurse will get in touch with you. If everything is normal, you will get a letter in mail and a message via . If you don't hear from [place] in two weeks, please give [place] a call. Otherwise, we look forward to seeing you again at your next visit. If you have any questions or concerns before then, please call the clinic at [phone].  Please bring all your medications to every doctors visit  Sign up for My Chart to have easy access to your labs results, and communication with your Primary care physician. Please ask Front Desk for some assistance.   Please check-out at the front desk before leaving the clinic.    Take Care,   Dr. Andy Gauss   Back Exercises The following exercises strengthen the muscles that help to support the back. They also help to keep the lower back flexible. Doing these exercises can help to prevent back pain or lessen existing pain. If you have back pain or discomfort, try doing these exercises 2-3 times each day or as told by your health care provider. When the pain goes away, do them once each day, but increase the number of times that you repeat the steps for each exercise (do more repetitions). If you do not have back pain or discomfort, do these exercises once each day or as told by your health care provider. Exercises Single Knee to Chest Repeat these steps 3-5 times for each leg: 1. Lie on your back on a firm bed or the floor with your legs extended. 2. Bring one knee to your chest. Your other leg should stay extended and in contact with the floor. 3. Hold your knee in place by grabbing your knee or thigh. 4. Pull on your knee until you feel a gentle stretch in your lower back. 5. Hold the  stretch for 10-30 seconds. 6. Slowly release and straighten your leg. Pelvic Tilt Repeat these steps 5-10 times: 1. Lie on your back on a firm bed or the floor with your legs extended. 2. Bend your knees so they are pointing toward the ceiling and your feet are flat on the floor. 3. Tighten your lower abdominal muscles to press your lower back against the floor. This motion will tilt your pelvis so your tailbone points up toward the ceiling instead of pointing to your feet or the floor. 4. With gentle tension and even breathing, hold this position for 5-10 seconds. Cat-Cow Repeat these steps until your lower back becomes more flexible: 1. Get into a hands-and-knees position on a firm surface. Keep your hands under your shoulders, and keep your knees under your hips. You may place padding under your knees for comfort. 2. Let your head hang down, and point your tailbone toward the floor so your lower back becomes rounded like the back of a cat. 3. Hold this position for 5 seconds. 4. Slowly lift your head and point your tailbone up toward the ceiling so your back forms a sagging arch like the back of a cow. 5. Hold this position for 5 seconds.  Press-Ups Repeat these steps 5-10 times: 1. Lie on your abdomen (face-down) on the floor. 2. Place your palms near your  head, about shoulder-width apart. 3. While you keep your back as relaxed as possible and keep your hips on the floor, slowly straighten your arms to raise the top half of your body and lift your shoulders. Do not use your back muscles to raise your upper torso. You may adjust the placement of your hands to make yourself more comfortable. 4. Hold this position for 5 seconds while you keep your back relaxed. 5. Slowly return to lying flat on the floor.  Bridges Repeat these steps 10 times: 1. Lie on your back on a firm surface. 2. Bend your knees so they are pointing toward the ceiling and your feet are flat on the floor. 3. Tighten  your buttocks muscles and lift your buttocks off of the floor until your waist is at almost the same height as your knees. You should feel the muscles working in your buttocks and the back of your thighs. If you do not feel these muscles, slide your feet 1-2 inches farther away from your buttocks. 4. Hold this position for 3-5 seconds. 5. Slowly lower your hips to the starting position, and allow your buttocks muscles to relax completely. If this exercise is too easy, try doing it with your arms crossed over your chest. Abdominal Crunches Repeat these steps 5-10 times: 1. Lie on your back on a firm bed or the floor with your legs extended. 2. Bend your knees so they are pointing toward the ceiling and your feet are flat on the floor. 3. Cross your arms over your chest. 4. Tip your chin slightly toward your chest without bending your neck. 5. Tighten your abdominal muscles and slowly raise your trunk (torso) high enough to lift your shoulder blades a tiny bit off of the floor. Avoid raising your torso higher than that, because it can put too much stress on your low back and it does not help to strengthen your abdominal muscles. 6. Slowly return to your starting position. Back Lifts Repeat these steps 5-10 times: 1. Lie on your abdomen (face-down) with your arms at your sides, and rest your forehead on the floor. 2. Tighten the muscles in your legs and your buttocks. 3. Slowly lift your chest off of the floor while you keep your hips pressed to the floor. Keep the back of your head in line with the curve in your back. Your eyes should be looking at the floor. 4. Hold this position for 3-5 seconds. 5. Slowly return to your starting position. Contact a health care provider if:  Your back pain or discomfort gets much worse when you do an exercise.  Your back pain or discomfort does not lessen within 2 hours after you exercise. If you have any of these problems, stop doing these exercises right  away. Do not do them again unless your health care provider says that you can. Get help right away if:  You develop sudden, severe back pain. If this happens, stop doing the exercises right away. Do not do them again unless your health care provider says that you can. This information is not intended to replace advice given to you by your health care provider. Make sure you discuss any questions you have with your health care provider. Document Released: 04/05/2004 Document Revised: 07/02/2017 Document Reviewed: 04/22/2014 Elsevier Interactive Patient Education  [employer].

## 2018-11-27 ENCOUNTER — Ambulatory Visit: Payer: Self-pay | Admitting: Family Medicine

## 2018-11-27 NOTE — Progress Notes (Deleted)
   Subjective:    Patient ID: Leslie Skinner, female    DOB: [birth year], 23 y.o.   MRN: [id number]   CC: "positive pregnancy test"  HPI: Leslie Skinner is a 23 year old G1P1 female with a history of hypertension, prediabetes, migraines, anxiety/depression, and elevated BMI presenting discuss the following:  Pregnancy:   Healthcare maintenance: Due for foot exam, ophthalmology exam, A1c, Pap smear, flu shot  Smoking status reviewed  Review of Systems Per HPI    Objective:  There were no vitals taken for this visit. Vitals and nursing note reviewed  General: NAD, pleasant Cardiac: RRR, normal heart sounds, no murmurs Respiratory: CTAB, normal effort Abdomen: soft, nontender, nondistended Extremities: no edema or cyanosis. WWP. Skin: warm and dry, no rashes noted Neuro: alert and oriented, no focal deficits Psych: normal affect  Assessment & Plan:    No problem-specific Assessment & Plan notes found for this encounter.    [facility] Resident PGY-2

## 2018-11-28 ENCOUNTER — Inpatient Hospital Stay (HOSPITAL_COMMUNITY)
Admission: AD | Admit: 2018-11-28 | Discharge: 2018-11-29 | Disposition: A | Discharge status: Home / Self Care | Payer: Medicaid Other | Attending: Obstetrics & Gynecology | Admitting: Obstetrics & Gynecology

## 2018-11-28 ENCOUNTER — Other Ambulatory Visit: Payer: Self-pay

## 2018-11-28 ENCOUNTER — Encounter (HOSPITAL_COMMUNITY): Payer: Self-pay

## 2018-11-28 DIAGNOSIS — Z8379 Family history of other diseases of the digestive system: Secondary | ICD-10-CM | POA: Insufficient documentation

## 2018-11-28 DIAGNOSIS — Z888 Allergy status to other drugs, medicaments and biological substances status: Secondary | ICD-10-CM | POA: Insufficient documentation

## 2018-11-28 DIAGNOSIS — Z8349 Family history of other endocrine, nutritional and metabolic diseases: Secondary | ICD-10-CM | POA: Insufficient documentation

## 2018-11-28 DIAGNOSIS — Z885 Allergy status to narcotic agent status: Secondary | ICD-10-CM | POA: Insufficient documentation

## 2018-11-28 DIAGNOSIS — Z8249 Family history of ischemic heart disease and other diseases of the circulatory system: Secondary | ICD-10-CM | POA: Insufficient documentation

## 2018-11-28 DIAGNOSIS — O9989 Other specified diseases and conditions complicating pregnancy, childbirth and the puerperium: Secondary | ICD-10-CM | POA: Insufficient documentation

## 2018-11-28 DIAGNOSIS — R102 Pelvic and perineal pain: Secondary | ICD-10-CM | POA: Diagnosis not present

## 2018-11-28 DIAGNOSIS — Z3A17 17 weeks gestation of pregnancy: Secondary | ICD-10-CM | POA: Diagnosis not present

## 2018-11-28 DIAGNOSIS — Z79899 Other long term (current) drug therapy: Secondary | ICD-10-CM | POA: Diagnosis not present

## 2018-11-28 DIAGNOSIS — O26899 Other specified pregnancy related conditions, unspecified trimester: Secondary | ICD-10-CM | POA: Diagnosis not present

## 2018-11-28 DIAGNOSIS — M549 Dorsalgia, unspecified: Secondary | ICD-10-CM | POA: Diagnosis not present

## 2018-11-28 DIAGNOSIS — O26892 Other specified pregnancy related conditions, second trimester: Secondary | ICD-10-CM | POA: Insufficient documentation

## 2018-11-28 DIAGNOSIS — Z833 Family history of diabetes mellitus: Secondary | ICD-10-CM | POA: Diagnosis not present

## 2018-11-28 DIAGNOSIS — Y92481 Parking lot as the place of occurrence of the external cause: Secondary | ICD-10-CM | POA: Diagnosis not present

## 2018-11-28 LAB — URINALYSIS, ROUTINE W REFLEX MICROSCOPIC
Bilirubin Urine: NEGATIVE
Glucose, UA: NEGATIVE mg/dL
Hgb urine dipstick: NEGATIVE
Ketones, ur: NEGATIVE mg/dL
Leukocytes,Ua: NEGATIVE
Nitrite: NEGATIVE
Protein, ur: NEGATIVE mg/dL
Specific Gravity, Urine: 1.025 (ref 1.005–1.030)
pH: 6 (ref 5.0–8.0)

## 2018-11-28 LAB — POCT PREGNANCY, URINE: Preg Test, Ur: POSITIVE — AB
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
## 2018-11-29 DIAGNOSIS — O26892 Other specified pregnancy related conditions, second trimester: Secondary | ICD-10-CM

## 2018-11-29 DIAGNOSIS — M549 Dorsalgia, unspecified: Secondary | ICD-10-CM

## 2018-11-29 DIAGNOSIS — R102 Pelvic and perineal pain: Secondary | ICD-10-CM

## 2018-11-29 DIAGNOSIS — O26899 Other specified pregnancy related conditions, unspecified trimester: Secondary | ICD-10-CM

## 2018-11-29 DIAGNOSIS — Z3A17 17 weeks gestation of pregnancy: Secondary | ICD-10-CM

## 2018-11-29 LAB — WET PREP, GENITAL
Clue Cells Wet Prep HPF POC: NONE SEEN
Sperm: NONE SEEN
Trich, Wet Prep: NONE SEEN
Yeast Wet Prep HPF POC: NONE SEEN

## 2018-11-29 MED ORDER — CYCLOBENZAPRINE HCL 10 MG PO TABS: 10.0000 mg | ORAL_TABLET | Freq: Two times a day (BID) | ORAL | 0 refills | Status: DC | PRN

## 2018-11-29 MED ORDER — COMFORT FIT MATERNITY SUPP LG MISC: 1.0000 [IU] | Freq: Every day | 0 refills | Status: DC
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
## 2018-11-29 NOTE — Discharge Instructions (Signed)

## 2018-11-30 ENCOUNTER — Other Ambulatory Visit: Payer: Self-pay | Admitting: Certified Nurse Midwife

## 2018-12-02 LAB — CERVICOVAGINAL ANCILLARY ONLY
Chlamydia: NEGATIVE
Neisseria Gonorrhea: NEGATIVE

## 2018-12-15 ENCOUNTER — Ambulatory Visit (INDEPENDENT_AMBULATORY_CARE_PROVIDER_SITE_OTHER): Payer: Self-pay | Admitting: *Deleted

## 2018-12-15 ENCOUNTER — Other Ambulatory Visit: Payer: Self-pay

## 2018-12-15 ENCOUNTER — Other Ambulatory Visit (HOSPITAL_COMMUNITY)
Admission: RE | Admit: 2018-12-15 | Discharge: 2018-12-15 | Disposition: A | Discharge status: Home / Self Care | Payer: Medicaid Other | Source: Ambulatory Visit | Attending: Obstetrics and Gynecology | Admitting: Obstetrics and Gynecology

## 2018-12-15 VITALS — BP 121/74 | HR 86 | Temp 98.5°F | Wt 234.0 lb

## 2018-12-15 DIAGNOSIS — N898 Other specified noninflammatory disorders of vagina: Secondary | ICD-10-CM | POA: Diagnosis present

## 2018-12-15 DIAGNOSIS — O0990 Supervision of high risk pregnancy, unspecified, unspecified trimester: Secondary | ICD-10-CM | POA: Insufficient documentation

## 2018-12-15 DIAGNOSIS — O26899 Other specified pregnancy related conditions, unspecified trimester: Secondary | ICD-10-CM | POA: Insufficient documentation

## 2018-12-15 MED ORDER — PRENATAL COMPLETE 14-0.4 MG PO TABS: 1.0000 | ORAL_TABLET | Freq: Every day | ORAL | 12 refills | Status: DC

## 2018-12-15 NOTE — Progress Notes (Signed)
   PRENATAL INTAKE SUMMARY  Ms. Blakenship presents today New OB Nurse Interview.  OB History    Gravida  2   Para  1   Term  1   Preterm      AB      Living  1     SAB      TAB      Ectopic      Multiple  0   Live Births  1          I have reviewed the patient's medical, obstetrical, social, and family histories, medications, and available lab results.  SUBJECTIVE She has complaints of white vaginal discharge x 1 month. Patient reported she had BV but never picked up Rx. Pt also stated she has history or DM and  taken off medication (Metformin 2014). Also history of severe pre-eclampsia. Pt reported of feeling depressed and crying for no reason. Pt requested to speak with someone regarding depression.  OBJECTIVE Initial nurse interview for history and lab work (New OB).  EDD: 05/06/2019 by LMP GA: [redacted]w[redacted]d G2P1001 FHT: 153  GENERAL APPEARANCE: alert, well appearing, in no apparent distress, oriented to person, place and time, overweight.  ASSESSMENT Normal pregnancy  PLAN Prenatal care- [facility] Pnl/HIV  OB Urine Culture GC/CT- results pending HgbEval/SMA/CF ([facility])/Panorama to be completed at next visit A1C Glucose   CHTN - P/C Ratio and CMP Ultrasound >14 complete/anatomy ordered Refill for prenatal vitamins  Need referral to [facility]: PHQ-9 score: 9 and GAD-7 score: 11  Derl Barrow, RN

## 2018-12-16 LAB — COMPREHENSIVE METABOLIC PANEL
ALT: 7 IU/L (ref 0–32)
AST: 13 IU/L (ref 0–40)
Albumin/Globulin Ratio: 1.3 (ref 1.2–2.2)
Albumin: 3.4 g/dL — ABNORMAL LOW (ref 3.9–5.0)
Alkaline Phosphatase: 102 IU/L (ref 39–117)
BUN/Creatinine Ratio: 15 (ref 9–23)
BUN: 10 mg/dL (ref 6–20)
Bilirubin Total: 0.3 mg/dL (ref 0.0–1.2)
CO2: 22 mmol/L (ref 20–29)
Calcium: 9 mg/dL (ref 8.7–10.2)
Chloride: 105 mmol/L (ref 96–106)
Creatinine, Ser: 0.65 mg/dL (ref 0.57–1.00)
GFR calc Af Amer: 144 mL/min/{1.73_m2} (ref >59)
GFR calc non Af Amer: 125 mL/min/{1.73_m2} (ref >59)
Globulin, Total: 2.7 g/dL (ref 1.5–4.5)
Glucose: 82 mg/dL (ref 65–99)
Potassium: 3.7 mmol/L (ref 3.5–5.2)
Sodium: 139 mmol/L (ref 134–144)
Total Protein: 6.1 g/dL (ref 6.0–8.5)

## 2018-12-16 LAB — OBSTETRIC PANEL, INCLUDING HIV
Antibody Screen: NEGATIVE
Basophils Absolute: 0 10*3/uL (ref 0.0–0.2)
Basos: 0 %
EOS (ABSOLUTE): 0.1 10*3/uL (ref 0.0–0.4)
Eos: 1 %
HIV Screen 4th Generation wRfx: NONREACTIVE
Hematocrit: 34.7 % (ref 34.0–46.6)
Hemoglobin: 11.4 g/dL (ref 11.1–15.9)
Hepatitis B Surface Ag: NEGATIVE
Immature Grans (Abs): 0.1 10*3/uL (ref 0.0–0.1)
Immature Granulocytes: 1 %
Lymphocytes Absolute: 2.7 10*3/uL (ref 0.7–3.1)
Lymphs: 27 %
MCH: 26.4 pg — ABNORMAL LOW (ref 26.6–33.0)
MCHC: 32.9 g/dL (ref 31.5–35.7)
MCV: 80 fL (ref 79–97)
Monocytes Absolute: 0.7 10*3/uL (ref 0.1–0.9)
Monocytes: 7 %
Neutrophils Absolute: 6.2 10*3/uL (ref 1.4–7.0)
Neutrophils: 64 %
Platelets: 208 10*3/uL (ref 150–450)
RBC: 4.32 x10E6/uL (ref 3.77–5.28)
RDW: 15.1 % (ref 11.7–15.4)
RPR Ser Ql: NONREACTIVE
Rh Factor: POSITIVE
Rubella Antibodies, IGG: 3.99 index (ref >0.99)
WBC: 9.8 10*3/uL (ref 3.4–10.8)

## 2018-12-16 LAB — HEMOGLOBIN A1C
Est. average glucose Bld gHb Est-mCnc: 111 mg/dL
Hgb A1c MFr Bld: 5.5 % (ref 4.8–5.6)

## 2018-12-16 LAB — PROTEIN / CREATININE RATIO, URINE
Creatinine, Urine: 150.9 mg/dL
Protein, Ur: 16.6 mg/dL
Protein/Creat Ratio: 110 mg/g creat (ref 0–200)

## 2018-12-17 LAB — CULTURE, OB URINE

## 2018-12-17 LAB — URINE CULTURE, OB REFLEX

## 2018-12-20 ENCOUNTER — Inpatient Hospital Stay (HOSPITAL_COMMUNITY)
Admission: AD | Admit: 2018-12-20 | Discharge: 2018-12-20 | Disposition: A | Discharge status: Home / Self Care | Payer: Medicaid Other | Attending: Obstetrics & Gynecology | Admitting: Obstetrics & Gynecology

## 2018-12-20 ENCOUNTER — Other Ambulatory Visit: Payer: Self-pay

## 2018-12-20 ENCOUNTER — Encounter (HOSPITAL_COMMUNITY): Payer: Self-pay

## 2018-12-20 DIAGNOSIS — Z3A20 20 weeks gestation of pregnancy: Secondary | ICD-10-CM | POA: Diagnosis not present

## 2018-12-20 DIAGNOSIS — M549 Dorsalgia, unspecified: Secondary | ICD-10-CM

## 2018-12-20 DIAGNOSIS — O26892 Other specified pregnancy related conditions, second trimester: Secondary | ICD-10-CM | POA: Diagnosis not present

## 2018-12-20 DIAGNOSIS — M545 Low back pain: Secondary | ICD-10-CM | POA: Diagnosis not present

## 2018-12-20 DIAGNOSIS — R1030 Lower abdominal pain, unspecified: Secondary | ICD-10-CM | POA: Diagnosis present

## 2018-12-20 DIAGNOSIS — Z7722 Contact with and (suspected) exposure to environmental tobacco smoke (acute) (chronic): Secondary | ICD-10-CM | POA: Insufficient documentation

## 2018-12-20 DIAGNOSIS — O99891 Other specified diseases and conditions complicating pregnancy: Secondary | ICD-10-CM | POA: Diagnosis not present

## 2018-12-20 LAB — URINALYSIS, ROUTINE W REFLEX MICROSCOPIC
Bilirubin Urine: NEGATIVE
Glucose, UA: NEGATIVE mg/dL
Hgb urine dipstick: NEGATIVE
Ketones, ur: NEGATIVE mg/dL
Leukocytes,Ua: NEGATIVE
Nitrite: NEGATIVE
Protein, ur: NEGATIVE mg/dL
Specific Gravity, Urine: 1.023 (ref 1.005–1.030)
pH: 6 (ref 5.0–8.0)

## 2018-12-20 MED ORDER — CYCLOBENZAPRINE HCL 10 MG PO TABS: 10.0000 mg | ORAL_TABLET | Freq: Three times a day (TID) | ORAL | 0 refills | Status: DC | PRN

## 2018-12-20 NOTE — MAU Provider Note (Signed)
History     CSN: [id number]  Arrival date and time: 12/20/18 1231   First Provider Initiated Contact with Patient 12/20/18 1339      Chief Complaint  Patient presents with  . Abdominal Pain   HPI Leslie Skinner 22 y.o. [redacted]w[redacted]d  Comes to MAU with lower abdominal pain and right lower back pain.  Uses an ice pack all night.  Has used Tylenol by mouth and pain is basically the same.  Has been prescribed a belly support band but it does not seem to be helping the pains on the sides of her abdomen.  Also has pains that shoot down into her groin.  Has pain on Right lower back that has not responded to ice packs or heating pad.  Client reports she is not feeling contractions.  Denies any vaginal leaking or bleeding. Her sister got tested for Covid and does not yet have her results.  Client requesting to be tested today.  In negative pressure room and Covid precautions used while patient was in MAU.   OB History    Gravida  2   Para  1   Term  1   Preterm      AB      Living  1     SAB      TAB      Ectopic      Multiple  0   Live Births  1           Past Medical History:  Diagnosis Date  . Acanthosis nigricans, acquired   . Constipation   . Diabetes mellitus type II    Young age around 64 per pt  . GERD (gastroesophageal reflux disease)   . Goiter   . Hypertension   . Migraines   . Obesity   . Osteochondroma of lower leg   . Pollen allergies   . Pregnancy induced hypertension   . Scoliosis     Past Surgical History:  Procedure Laterality Date  . LOWER LEG SOFT TISSUE TUMOR EXCISION  2011  . OSTEOCHONDROMA EXCISION      Family History  Problem Relation Age of Onset  . Diabetes Mother   . Ulcers Mother   . Thyroid disease Mother   . Cancer Mother   . Obesity Mother   . Heart disease Mother   . Diabetes Maternal Grandmother   . Stroke Maternal Grandmother   . Ulcers Maternal Grandmother   . Diabetes Maternal Grandfather   . GER disease Maternal  Grandfather   . Heart disease Maternal Grandfather   . GER disease Sister   . Diabetes Father   . GER disease Father   . Obesity Sister   . Diabetes Paternal Grandmother   . Thyroid disease Paternal Grandmother   . Diabetes Paternal Grandfather   . Heart disease Maternal Aunt   . Diabetes Maternal Aunt   . Thyroid disease Maternal Aunt   . Cancer Maternal Aunt   . Obesity Maternal Aunt   . GER disease Maternal Aunt     Social History   Tobacco Use  . Smoking status: Passive Smoke Exposure - Never Smoker  . Smokeless tobacco: Never Used  Substance Use Topics  . Alcohol use: No  . Drug use: No    Allergies:  Allergies  Allergen Reactions  . Morphine And Related Other (See Comments)    Hot flashes, can't breathe  . Tramadol Other (See Comments)    Hot flashes  Medications Prior to Admission  Medication Sig Dispense Refill Last Dose  . acetaminophen (TYLENOL) 500 MG tablet Take 1 tablet (500 mg total) by mouth every 6 (six) hours as needed. 30 tablet 0 12/20/2018 at 0900  . Elastic Bandages & Supports (COMFORT FIT MATERNITY SUPP LG) MISC 1 Units by Does not apply route daily. 1 each 0   . Prenatal Vit-Fe Fumarate-FA (PRENATAL COMPLETE) 14-0.4 MG TABS Take 1 tablet by mouth daily. 30 tablet 12     Review of Systems  Constitutional: Negative for fever.  HENT: Negative for sore throat.   Respiratory: Negative for cough and shortness of breath.   Gastrointestinal: Positive for abdominal pain.  Genitourinary: Negative for dysuria, vaginal bleeding and vaginal discharge.   Physical Exam   Blood pressure (!) 113/57, pulse 100, temperature 98.4 F (36.9 C), temperature source Oral, resp. rate 20, last menstrual period 07/30/2018, unknown if currently breastfeeding.  Physical Exam  Nursing note and vitals reviewed. Constitutional: She is oriented to person, place, and time. She appears well-developed and well-nourished.  HENT:  Head: Normocephalic.  Eyes: EOM are  normal.  Neck: Neck supple.  GI: Soft. There is no abdominal tenderness. There is no rebound and no guarding.  No contractions palpated   Genitourinary:    Genitourinary Comments: Bimanual done - vertex is high and multiparous cervix is closed and firm   Musculoskeletal: Normal range of motion.  Neurological: She is alert and oriented to person, place, and time.  Skin: Skin is warm and dry.  Psychiatric: She has a normal mood and affect.    MAU Course  Procedures Results for orders placed or performed during the hospital encounter of 12/20/18 (from the past 24 hour(s))  Urinalysis, Routine w reflex microscopic     Status: Abnormal   Collection Time: 12/20/18  1:49 PM  Result Value Ref Range   Color, Urine YELLOW YELLOW   APPearance HAZY (A) CLEAR   Specific Gravity, Urine 1.023 1.005 - 1.030   pH 6.0 5.0 - 8.0   Glucose, UA NEGATIVE NEGATIVE mg/dL   Hgb urine dipstick NEGATIVE NEGATIVE   Bilirubin Urine NEGATIVE NEGATIVE   Ketones, ur NEGATIVE NEGATIVE mg/dL   Protein, ur NEGATIVE NEGATIVE mg/dL   Nitrite NEGATIVE NEGATIVE   Leukocytes,Ua NEGATIVE NEGATIVE    MDM Reviewed that round ligament pain is normal and that no medication will take that away, but progression in the pregnancy will decrease the feelings of those pains.  Advised that stretching exercises will also help to decrease her back pain and that back pain may be a normal finding in pregnancy especially as she is picking up and carrying her one year old child who is not yet walking.  Advised that currently her cervix is closed and that if her condition worsens, she should return for re-evaluation.  Has had STD testing and tests have been negative in this pregnancy.  Not repeated today.  Assessment and Plan  Cervix is closed Back pain in pregnancy Possible Covid exposure  Plan Keep your appointments. Will prescribe Flexeril that you can try for your back pain. If you want a covid test, go to the Outpatient testing  at the [facility]. See AVS for additional information given to client.  Yony Roulston L Kaven [place]ie 12/20/2018, 1:56 PM

## 2018-12-20 NOTE — MAU Note (Signed)
Pt reports to mau with c/o of lower abd/pelvic pain.  Pt reports wearing belly band every day without relief.  Pt denies vag bleeding or dc.  Pt reports her sister was tested for covid last week, but has not gotten results back yet.  Pt denies any covid symptoms.  FHR: 152

## 2018-12-20 NOTE — Discharge Instructions (Signed)
Get medicine from your pharmacy and take as instructed. Medicine can make you drowsy. You can take 1/2 tablet if desired. Outpatient Covid testing is done at the [facility] campus.

## 2018-12-22 LAB — CERVICOVAGINAL ANCILLARY ONLY
Bacterial Vaginitis (gardnerella): NEGATIVE
Candida Glabrata: NEGATIVE
Candida Vaginitis: NEGATIVE
Chlamydia: NEGATIVE
Comment: NEGATIVE
Comment: NEGATIVE
Comment: NEGATIVE
Neisseria Gonorrhea: NEGATIVE
Trichomonas: NEGATIVE

## 2018-12-22 NOTE — BH Specialist Note (Signed)
Pt did not arrive to video visit. Pt answered the phone, said she was in the car, driving, coming from her son's doctor appointment, and would be home in about 8 minutes. Call back to patient and Left HIPPA-compliant message to call back [name] from [facility] [employer] at [phone]. Sent  Doximity call to patient, with link to video visit; pt did not arrive after waiting for an additional 10 minutes. Left MyChart message also for patient to reschedule.   [facility] via Telemedicine Video Visit  10/12/20201 CHERINA SPARHAWK [id number]  [name]  Depression screen [facility] 2/9 12/15/2018 08/05/2018 10/21/2017 10/02/2017 09/18/2017  Decreased Interest 1 1 1 2 2   Down, Depressed, Hopeless 2 1 1 2 1   PHQ - 2 Score 3 2 2 4 3   Altered sleeping 1 - 2 2 3   Tired, decreased energy 2 - 0 2 1  Change in appetite 1 - 1 2 1   Feeling bad or failure about yourself  1 - 0 0 0  Trouble concentrating 1 - 0 0 0  Moving slowly or fidgety/restless 0 - 0 0 0  Suicidal thoughts 0 - 0 0 0  PHQ-9 Score 9 - 5 10 8   Difficult doing work/chores Not difficult at all - - - -  Some recent data might be hidden   GAD 7 : Generalized Anxiety Score 12/15/2018 10/21/2017 10/02/2017 09/18/2017  Nervous, Anxious, on Edge 2 1 1 2   Control/stop worrying 2 1 1 1   Worry too much - different things 2 1 1 2   Trouble relaxing 1 1 1 1   Restless 1 0 0 0  Easily annoyed or irritable 2 1 3 2   Afraid - awful might happen 1 0 0 0  Total GAD 7 Score 11 5 7 8   Anxiety Difficulty Not difficult at all - - -

## 2018-12-23 ENCOUNTER — Other Ambulatory Visit: Payer: Self-pay

## 2018-12-23 ENCOUNTER — Ambulatory Visit: Payer: Self-pay | Admitting: Clinical

## 2018-12-23 DIAGNOSIS — Z5329 Procedure and treatment not carried out because of patient's decision for other reasons: Secondary | ICD-10-CM

## 2018-12-23 DIAGNOSIS — Z91199 Patient's noncompliance with other medical treatment and regimen due to unspecified reason: Secondary | ICD-10-CM

## 2018-12-24 ENCOUNTER — Encounter (HOSPITAL_COMMUNITY): Payer: Self-pay | Admitting: *Deleted

## 2018-12-24 ENCOUNTER — Telehealth: Payer: Self-pay | Admitting: General Practice

## 2018-12-24 ENCOUNTER — Other Ambulatory Visit: Payer: Self-pay

## 2018-12-24 ENCOUNTER — Inpatient Hospital Stay (HOSPITAL_COMMUNITY)
Admission: AD | Admit: 2018-12-24 | Discharge: 2018-12-24 | Disposition: A | Discharge status: Home / Self Care | Payer: Medicaid Other | Attending: Obstetrics and Gynecology | Admitting: Obstetrics and Gynecology

## 2018-12-24 DIAGNOSIS — Z3A21 21 weeks gestation of pregnancy: Secondary | ICD-10-CM | POA: Insufficient documentation

## 2018-12-24 DIAGNOSIS — R531 Weakness: Secondary | ICD-10-CM | POA: Diagnosis not present

## 2018-12-24 DIAGNOSIS — H539 Unspecified visual disturbance: Secondary | ICD-10-CM | POA: Insufficient documentation

## 2018-12-24 DIAGNOSIS — O085 Metabolic disorders following an ectopic and molar pregnancy: Secondary | ICD-10-CM | POA: Diagnosis not present

## 2018-12-24 DIAGNOSIS — Z7722 Contact with and (suspected) exposure to environmental tobacco smoke (acute) (chronic): Secondary | ICD-10-CM | POA: Insufficient documentation

## 2018-12-24 DIAGNOSIS — O219 Vomiting of pregnancy, unspecified: Secondary | ICD-10-CM | POA: Diagnosis not present

## 2018-12-24 DIAGNOSIS — O24912 Unspecified diabetes mellitus in pregnancy, second trimester: Secondary | ICD-10-CM | POA: Diagnosis not present

## 2018-12-24 DIAGNOSIS — R519 Headache, unspecified: Secondary | ICD-10-CM | POA: Diagnosis not present

## 2018-12-24 DIAGNOSIS — M419 Scoliosis, unspecified: Secondary | ICD-10-CM | POA: Insufficient documentation

## 2018-12-24 DIAGNOSIS — O212 Late vomiting of pregnancy: Secondary | ICD-10-CM | POA: Diagnosis not present

## 2018-12-24 DIAGNOSIS — E876 Hypokalemia: Secondary | ICD-10-CM

## 2018-12-24 DIAGNOSIS — O132 Gestational [pregnancy-induced] hypertension without significant proteinuria, second trimester: Secondary | ICD-10-CM | POA: Diagnosis not present

## 2018-12-24 DIAGNOSIS — O26892 Other specified pregnancy related conditions, second trimester: Secondary | ICD-10-CM | POA: Diagnosis not present

## 2018-12-24 DIAGNOSIS — O0990 Supervision of high risk pregnancy, unspecified, unspecified trimester: Secondary | ICD-10-CM

## 2018-12-24 DIAGNOSIS — R109 Unspecified abdominal pain: Secondary | ICD-10-CM | POA: Insufficient documentation

## 2018-12-24 DIAGNOSIS — O99282 Endocrine, nutritional and metabolic diseases complicating pregnancy, second trimester: Secondary | ICD-10-CM | POA: Insufficient documentation

## 2018-12-24 LAB — CBC WITH DIFFERENTIAL/PLATELET
Abs Immature Granulocytes: 0.04 10*3/uL (ref 0.00–0.07)
Basophils Absolute: 0 10*3/uL (ref 0.0–0.1)
Basophils Relative: 0 %
Eosinophils Absolute: 0.1 10*3/uL (ref 0.0–0.5)
Eosinophils Relative: 1 %
HCT: 30.8 % — ABNORMAL LOW (ref 36.0–46.0)
Hemoglobin: 10.7 g/dL — ABNORMAL LOW (ref 12.0–15.0)
Immature Granulocytes: 0 %
Lymphocytes Relative: 26 %
Lymphs Abs: 2.7 10*3/uL (ref 0.7–4.0)
MCH: 27.9 pg (ref 26.0–34.0)
MCHC: 34.7 g/dL (ref 30.0–36.0)
MCV: 80.2 fL (ref 80.0–100.0)
Monocytes Absolute: 0.6 10*3/uL (ref 0.1–1.0)
Monocytes Relative: 6 %
Neutro Abs: 6.9 10*3/uL (ref 1.7–7.7)
Neutrophils Relative %: 67 %
Platelets: 229 10*3/uL (ref 150–400)
RBC: 3.84 MIL/uL — ABNORMAL LOW (ref 3.87–5.11)
RDW: 15.4 % (ref 11.5–15.5)
WBC: 10.5 10*3/uL (ref 4.0–10.5)
nRBC: 0 % (ref 0.0–0.2)

## 2018-12-24 LAB — URINALYSIS, ROUTINE W REFLEX MICROSCOPIC
Bilirubin Urine: NEGATIVE
Glucose, UA: NEGATIVE mg/dL
Hgb urine dipstick: NEGATIVE
Ketones, ur: 5 mg/dL — AB
Leukocytes,Ua: NEGATIVE
Nitrite: NEGATIVE
Protein, ur: NEGATIVE mg/dL
Specific Gravity, Urine: 1.027 (ref 1.005–1.030)
pH: 6 (ref 5.0–8.0)

## 2018-12-24 LAB — COMPREHENSIVE METABOLIC PANEL
ALT: 10 U/L (ref 0–44)
AST: 13 U/L — ABNORMAL LOW (ref 15–41)
Albumin: 2.5 g/dL — ABNORMAL LOW (ref 3.5–5.0)
Alkaline Phosphatase: 79 U/L (ref 38–126)
Anion gap: 10 (ref 5–15)
BUN: 7 mg/dL (ref 6–20)
CO2: 24 mmol/L (ref 22–32)
Calcium: 8.8 mg/dL — ABNORMAL LOW (ref 8.9–10.3)
Chloride: 104 mmol/L (ref 98–111)
Creatinine, Ser: 0.73 mg/dL (ref 0.44–1.00)
GFR calc Af Amer: >60 mL/min (ref >60)
GFR calc non Af Amer: >60 mL/min (ref >60)
Glucose, Bld: 85 mg/dL (ref 70–99)
Potassium: 3.1 mmol/L — ABNORMAL LOW (ref 3.5–5.1)
Sodium: 138 mmol/L (ref 135–145)
Total Bilirubin: 0.1 mg/dL — ABNORMAL LOW (ref 0.3–1.2)
Total Protein: 6.3 g/dL — ABNORMAL LOW (ref 6.5–8.1)

## 2018-12-24 LAB — PROTEIN / CREATININE RATIO, URINE
Creatinine, Urine: 249.9 mg/dL
Protein Creatinine Ratio: 0.07 mg/mg{Cre} (ref 0.00–0.15)
Total Protein, Urine: 17 mg/dL

## 2018-12-24 LAB — POCT FERN TEST: POCT Fern Test: NEGATIVE

## 2018-12-24 LAB — GLUCOSE, CAPILLARY: Glucose-Capillary: 90 mg/dL (ref 70–99)

## 2018-12-24 MED ORDER — LACTATED RINGERS IV BOLUS
1000.0000 mL | Freq: Once | INTRAVENOUS | Status: AC
  Administered 2018-12-24: 1000 mL via INTRAVENOUS

## 2018-12-24 MED ORDER — ONDANSETRON 8 MG PO TBDP: 8.0000 mg | ORAL_TABLET | Freq: Three times a day (TID) | ORAL | 0 refills | Status: AC | PRN

## 2018-12-24 MED ORDER — DEXAMETHASONE SODIUM PHOSPHATE 10 MG/ML IJ SOLN
10.0000 mg | Freq: Once | INTRAMUSCULAR | Status: AC
  Administered 2018-12-24: 10 mg via INTRAVENOUS
  Filled 2018-12-24: qty 1

## 2018-12-24 MED ORDER — METOCLOPRAMIDE HCL 5 MG/ML IJ SOLN
10.0000 mg | Freq: Once | INTRAMUSCULAR | Status: AC
  Administered 2018-12-24: 10 mg via INTRAVENOUS
  Filled 2018-12-24: qty 2

## 2018-12-24 MED ORDER — POTASSIUM CHLORIDE ER 10 MEQ PO TBCR: 10.0000 meq | EXTENDED_RELEASE_TABLET | Freq: Every day | ORAL | 0 refills | Status: DC

## 2018-12-24 MED ORDER — DIPHENHYDRAMINE HCL 50 MG/ML IJ SOLN
12.5000 mg | Freq: Once | INTRAMUSCULAR | Status: AC
  Administered 2018-12-24: 12.5 mg via INTRAVENOUS
  Filled 2018-12-24: qty 1

## 2018-12-24 NOTE — MAU Provider Note (Signed)
History     CSN: [id number]  Arrival date and time: 12/24/18 1152   First Provider Initiated Contact with Patient 12/24/18 1244      Chief Complaint  Patient presents with  . Abdominal Pain  . Emesis   Ms. Leslie Skinner is a 23 y.o. G2P1001 at [redacted]w[redacted]d who presents to MAU for preeclampsia evaluation after she experienced visual changes this morning while working from home. Pt reports two, brief episodes of seeing small black spots "like gnats" in her vision.  Pt also reports HA that started this morning along with the changes in vision. Pt rates HA as 6/10. Pt has history of migraine HA "when I was younger, but now they only come when I'm in pain." Pt denies any HA so far this pregnancy. Pt reports taking Tylenol 1000mg  early this morning, but reports it did not help.  Pt also reports N/V that started this morning along with the small black spots. Pt reports vomiting x4 since this morning. Pt denies problems with N/V this pregnancy.  Pt also reports SOB when the spots appeared, but denies feeling SOB at this time. Pt also reports generalized weakness, hot flashes, and a general feeling of being unwell.  Pt denies blurry vision, epigastric pain, swelling in face and hands, sudden weight gain. Pt denies chest pain.  Pt denies constipation, diarrhea, or urinary problems. Pt denies fever, chills, fatigue, sweating or changes in appetite. Pt denies dizziness, light-headedness, weakness.  Pt denies VB, ctx, LOF.  Current pregnancy problems? Hx PIH/DM, late [facility] Blood Type? A Positive Allergies? Morphine, tramadol Current medications? none Current [facility] & next appt? [facility], 01/02/2019   OB History    Gravida  2   Para  1   Term  1   Preterm      AB      Living  1     SAB      TAB      Ectopic      Multiple  0   Live Births  1           Past Medical History:  Diagnosis Date  . Acanthosis nigricans, acquired   . Constipation   . Diabetes mellitus  type II    Young age around 13 per pt  . GERD (gastroesophageal reflux disease)   . Goiter   . Hypertension   . Migraines   . Obesity   . Osteochondroma of lower leg   . Pollen allergies   . Pregnancy induced hypertension   . Scoliosis     Past Surgical History:  Procedure Laterality Date  . LOWER LEG SOFT TISSUE TUMOR EXCISION  2011  . OSTEOCHONDROMA EXCISION      Family History  Problem Relation Age of Onset  . Diabetes Mother   . Ulcers Mother   . Thyroid disease Mother   . Cancer Mother   . Obesity Mother   . Heart disease Mother   . Heart attack Mother        caused death   . Diabetes Maternal Grandmother   . Stroke Maternal Grandmother   . Ulcers Maternal Grandmother   . Diabetes Maternal Grandfather   . GER disease Maternal Grandfather   . Heart disease Maternal Grandfather   . GER disease Sister   . Diabetes Father   . GER disease Father   . Obesity Sister   . Diabetes Paternal Grandmother   . Thyroid disease Paternal Grandmother   . Diabetes Paternal Grandfather   .  Heart disease Maternal Aunt   . Diabetes Maternal Aunt   . Thyroid disease Maternal Aunt   . Cancer Maternal Aunt   . Obesity Maternal Aunt   . GER disease Maternal Aunt     Social History   Tobacco Use  . Smoking status: Passive Smoke Exposure - Never Smoker  . Smokeless tobacco: Never Used  Substance Use Topics  . Alcohol use: No  . Drug use: No    Allergies:  Allergies  Allergen Reactions  . Morphine And Related Other (See Comments)    Hot flashes, can't breathe  . Tramadol Other (See Comments)    Hot flashes     Medications Prior to Admission  Medication Sig Dispense Refill Last Dose  . acetaminophen (TYLENOL) 500 MG tablet Take 1 tablet (500 mg total) by mouth every 6 (six) hours as needed. 30 tablet 0 12/24/2018 at 0900  . cyclobenzaprine (FLEXERIL) 10 MG tablet Take 1 tablet (10 mg total) by mouth 3 (three) times daily as needed for muscle spasms. 30 tablet 0   .  Elastic Bandages & Supports (COMFORT FIT MATERNITY SUPP LG) MISC 1 Units by Does not apply route daily. 1 each 0   . Prenatal Vit-Fe Fumarate-FA (PRENATAL COMPLETE) 14-0.4 MG TABS Take 1 tablet by mouth daily. 30 tablet 12 More than a month at Unknown time    Review of Systems  Constitutional: Negative for chills, diaphoresis, fatigue and fever.  Eyes: Positive for visual disturbance.  Respiratory: Negative for shortness of breath.   Cardiovascular: Negative for chest pain.  Gastrointestinal: Positive for nausea and vomiting. Negative for abdominal pain, constipation and diarrhea.  Genitourinary: Negative for dysuria, flank pain, frequency, pelvic pain, urgency, vaginal bleeding and vaginal discharge.  Neurological: Positive for weakness and headaches. Negative for dizziness and light-headedness.   Physical Exam   Blood pressure 139/89, pulse 89, temperature 98.7 F (37.1 C), temperature source Oral, resp. rate 20, last menstrual period 07/30/2018, SpO2 100 %, unknown if currently breastfeeding.  Patient Vitals for the past 24 hrs:  BP Temp Temp src Pulse Resp SpO2  12/24/18 1753 139/89 98.7 F (37.1 C) Oral 89 20 100 %  12/24/18 1500 (!) 95/48 - - 76 - -  12/24/18 1445 (!) 92/47 - - 82 - -  12/24/18 1430 (!) 93/47 - - 77 - -  12/24/18 1415 (!) 96/45 - - 81 - -  12/24/18 1401 (!) 93/44 - - 70 - -  12/24/18 1346 (!) 100/49 - - 68 - -  12/24/18 1331 122/63 - - 86 - -  12/24/18 1315 114/60 - - 85 - -  12/24/18 1302 116/63 - - 90 - -  12/24/18 1206 (!) 143/82 98.8 F (37.1 C) Oral (!) 112 20 97 %   Physical Exam  Constitutional: She is oriented to person, place, and time. She appears well-developed and well-nourished. No distress.  HENT:  Head: Normocephalic and atraumatic.  Respiratory: Effort normal.  GI: Soft. She exhibits no distension and no mass. There is no abdominal tenderness. There is no rebound and no guarding.  Genitourinary: There is no rash, tenderness or lesion on  the right labia. There is no rash, tenderness or lesion on the left labia. Cervix exhibits no motion tenderness, no discharge and no friability.    No vaginal discharge, tenderness or bleeding.  No tenderness or bleeding in the vagina.    Genitourinary Comments: On SSE: no pooling of fluid, no fluid coming from os. CE: long/closed/posterior   Neurological:  She is alert and oriented to person, place, and time.  Skin: Skin is warm and dry. She is not diaphoretic.  Psychiatric: She has a normal mood and affect. Her behavior is normal. Judgment and thought content normal.   Results for orders placed or performed during the hospital encounter of 12/24/18 (from the past 24 hour(s))  Urinalysis, Routine w reflex microscopic     Status: Abnormal   Collection Time: 12/24/18 12:08 PM  Result Value Ref Range   Color, Urine YELLOW YELLOW   APPearance CLEAR CLEAR   Specific Gravity, Urine 1.027 1.005 - 1.030   pH 6.0 5.0 - 8.0   Glucose, UA NEGATIVE NEGATIVE mg/dL   Hgb urine dipstick NEGATIVE NEGATIVE   Bilirubin Urine NEGATIVE NEGATIVE   Ketones, ur 5 (A) NEGATIVE mg/dL   Protein, ur NEGATIVE NEGATIVE mg/dL   Nitrite NEGATIVE NEGATIVE   Leukocytes,Ua NEGATIVE NEGATIVE  Protein / creatinine ratio, urine     Status: None   Collection Time: 12/24/18 12:28 PM  Result Value Ref Range   Creatinine, Urine 249.90 mg/dL   Total Protein, Urine 17 mg/dL   Protein Creatinine Ratio 0.07 0.00 - 0.15 mg/mg[Cre]  CBC with Differential/Platelet     Status: Abnormal   Collection Time: 12/24/18 12:59 PM  Result Value Ref Range   WBC 10.5 4.0 - 10.5 K/uL   RBC 3.84 (L) 3.87 - 5.11 MIL/uL   Hemoglobin 10.7 (L) 12.0 - 15.0 g/dL   HCT 30.8 (L) 36.0 - 46.0 %   MCV 80.2 80.0 - 100.0 fL   MCH 27.9 26.0 - 34.0 pg   MCHC 34.7 30.0 - 36.0 g/dL   RDW 15.4 11.5 - 15.5 %   Platelets 229 150 - 400 K/uL   nRBC 0.0 0.0 - 0.2 %   Neutrophils Relative % 67 %   Neutro Abs 6.9 1.7 - 7.7 K/uL   Lymphocytes Relative 26 %    Lymphs Abs 2.7 0.7 - 4.0 K/uL   Monocytes Relative 6 %   Monocytes Absolute 0.6 0.1 - 1.0 K/uL   Eosinophils Relative 1 %   Eosinophils Absolute 0.1 0.0 - 0.5 K/uL   Basophils Relative 0 %   Basophils Absolute 0.0 0.0 - 0.1 K/uL   Immature Granulocytes 0 %   Abs Immature Granulocytes 0.04 0.00 - 0.07 K/uL  Comprehensive metabolic panel     Status: Abnormal   Collection Time: 12/24/18 12:59 PM  Result Value Ref Range   Sodium 138 135 - 145 mmol/L   Potassium 3.1 (L) 3.5 - 5.1 mmol/L   Chloride 104 98 - 111 mmol/L   CO2 24 22 - 32 mmol/L   Glucose, Bld 85 70 - 99 mg/dL   BUN 7 6 - 20 mg/dL   Creatinine, Ser 0.73 0.44 - 1.00 mg/dL   Calcium 8.8 (L) 8.9 - 10.3 mg/dL   Total Protein 6.3 (L) 6.5 - 8.1 g/dL   Albumin 2.5 (L) 3.5 - 5.0 g/dL   AST 13 (L) 15 - 41 U/L   ALT 10 0 - 44 U/L   Alkaline Phosphatase 79 38 - 126 U/L   Total Bilirubin 0.1 (L) 0.3 - 1.2 mg/dL   GFR calc non Af Amer >60 >60 mL/min   GFR calc Af Amer >60 >60 mL/min   Anion gap 10 5 - 15  Glucose, capillary     Status: None   Collection Time: 12/24/18  1:15 PM  Result Value Ref Range   Glucose-Capillary 90 70 - 99  mg/dL   MAU Course  Procedures  MDM -preeclampsia evaluation with elevated BP on admission with HA, visual change, N/V, generalized weakness, hot flashes, feeling unwell -only one elevated pressure on admission, remainder of BPs WNL or low -FHR: 152 -UA: 5ketones, otherwise WNL -CBC w/Diff: WNL for pregnancy, platelets 229 -CMP: K 3.1, no abnormalities requiring treatment AST/ALT 13/10 -PCr: 0.07 -CBG: 90 -HA cocktail given, pt reports all symptoms have now resolved -pt unable to urinate after single bag of fluids, second bag ordered -pt unable able to urinate after second bag of fluids despite multiple attempts and assistance by RN, third bag of fluids ordered -PO challenge successful -immediately after starting 3rd bag of fluids, patient felt strong urge to urinate and emptied her bladder onto  the bed, despite multiple attempts to use the bathroom prior -pt then attempted to make it to bathroom and again lost control of her bladder prior to making it to the toilet -SSE performed as fluid was clear and watery and patient unable to be certain it was not coming from the vagina. No pooling on SSE, no leaking of fluid from os -CE: long/closed/posterior -Fern: negative -consulted with Dr. Glo Herring @611PM , per Dr. Glo Herring, is likely from increased amount of fluids and is not cause for additional work-up in MAU. Pt OK to be discharged home. -pt discharged to home in stable condition  Orders Placed This Encounter  Procedures  . Urinalysis, Routine w reflex microscopic    Standing Status:   Standing    Number of Occurrences:   1  . CBC with Differential/Platelet    Standing Status:   Standing    Number of Occurrences:   1  . Comprehensive metabolic panel    Standing Status:   Standing    Number of Occurrences:   1  . Protein / creatinine ratio, urine    Standing Status:   Standing    Number of Occurrences:   1  . Glucose, capillary    Standing Status:   Standing    Number of Occurrences:   1  . POCT fern test    If suspected rupture of membranes and/or if amniotic fluid leakage is present    Standing Status:   Standing    Number of Occurrences:   1  . Insert peripheral IV    Standing Status:   Standing    Number of Occurrences:   1  . Discharge patient    Order Specific Question:   Discharge disposition    Answer:   01-Home or Self Care [1]    Order Specific Question:   Discharge patient date    Answer:   12/24/2018   Meds ordered this encounter  Medications  . lactated ringers bolus 1,000 mL  . dexamethasone (DECADRON) injection 10 mg  . diphenhydrAMINE (BENADRYL) injection 12.5 mg  . metoCLOPramide (REGLAN) injection 10 mg  . lactated ringers bolus 1,000 mL  . lactated ringers bolus 1,000 mL  . potassium chloride (KLOR-CON) 10 MEQ tablet    Sig: Take 1 tablet (10  mEq total) by mouth daily.    Dispense:  4 tablet    Refill:  0    Order Specific Question:   Supervising Provider    Answer:   ERVIN, MICHAEL L [[id number]]  . ondansetron (ZOFRAN ODT) 8 MG disintegrating tablet    Sig: Take 1 tablet (8 mg total) by mouth every 8 (eight) hours as needed for up to 7 days for nausea or vomiting.  Dispense:  21 tablet    Refill:  0    Order Specific Question:   Supervising Provider    Answer:   ERVIN, MICHAEL L [[id number]]   Assessment and Plan   1. Transient hypertension of pregnancy in second trimester   2. Supervision of high risk pregnancy, antepartum   3. Pregnancy headache in second trimester   4. Visual disturbance   5. Nausea and vomiting in pregnancy prior to [redacted] weeks gestation   6. Generalized weakness   7. Hypokalemia    Allergies as of 12/24/2018      Reactions   Morphine And Related Other (See Comments)   Hot flashes, can't breathe   Tramadol Other (See Comments)   Hot flashes       Medication List    TAKE these medications   acetaminophen 500 MG tablet Commonly known as: TYLENOL Take 1 tablet (500 mg total) by mouth every 6 (six) hours as needed.   Comfort Fit Maternity Supp Lg Misc 1 Units by Does not apply route daily.   cyclobenzaprine 10 MG tablet Commonly known as: FLEXERIL Take 1 tablet (10 mg total) by mouth 3 (three) times daily as needed for muscle spasms.   ondansetron 8 MG disintegrating tablet Commonly known as: Zofran ODT Take 1 tablet (8 mg total) by mouth every 8 (eight) hours as needed for up to 7 days for nausea or vomiting.   potassium chloride 10 MEQ tablet Commonly known as: KLOR-CON Take 1 tablet (10 mEq total) by mouth daily.   Prenatal Complete 14-0.4 MG Tabs Take 1 tablet by mouth daily.      -RX K-Dur x4days -RX Zofran for PRN use -pt advised to use Tylenol 1000mg  for HA at home and to present to MAU if not significantly improved after use -strict preeclampsia/VB/pain/return MAU precautions  given -pt discharged to home in stable condition  Elmyra Ricks E Cain Fitzhenry 12/24/2018, 6:28 PM

## 2018-12-24 NOTE — Progress Notes (Signed)
Assisted BTB.  N. Nugent, NP into eval pt & r/o possible ROM.

## 2018-12-24 NOTE — MAU Note (Signed)
Started having blurring vision.  Works at home.  Just started- was seeing spots, like a swarm of nats.  When she stood up, she started throwing up. "felt a rising up into her chest, like she was going to faint". Now having cramping in her abd. No bleeding or leaking.

## 2018-12-24 NOTE — Progress Notes (Signed)
CBG 90mg /dl

## 2018-12-24 NOTE — MAU Note (Signed)
Pt given crackers and fluids for PO challenge.

## 2018-12-24 NOTE — Discharge Instructions (Signed)
Preterm Labor and Birth Information  The normal length of a pregnancy is 39-41 weeks. Preterm labor is when labor starts before 37 completed weeks of pregnancy. What are the risk factors for preterm labor? Preterm labor is more likely to occur in women who:  Have certain infections during pregnancy such as a bladder infection, sexually transmitted infection, or infection inside the uterus (chorioamnionitis).  Have a shorter-than-normal cervix.  Have gone into preterm labor before.  Have had surgery on their cervix.  Are younger than age 87 or older than age 100.  Are African American.  Are pregnant with twins or multiple babies (multiple gestation).  Take street drugs or smoke while pregnant.  Do not gain enough weight while pregnant.  Became pregnant shortly after having been pregnant. What are the symptoms of preterm labor? Symptoms of preterm labor include:  Cramps similar to those that can happen during a menstrual period. The cramps may happen with diarrhea.  Pain in the abdomen or lower back.  Regular uterine contractions that may feel like tightening of the abdomen.  A feeling of increased pressure in the pelvis.  Increased watery or bloody mucus discharge from the vagina.  Water breaking (ruptured amniotic sac). Why is it important to recognize signs of preterm labor? It is important to recognize signs of preterm labor because babies who are born prematurely may not be fully developed. This can put them at an increased risk for:  Long-term (chronic) heart and lung problems.  Difficulty immediately after birth with regulating body systems, including blood sugar, body temperature, heart rate, and breathing rate.  Bleeding in the brain.  Cerebral palsy.  Learning difficulties.  Death. These risks are highest for babies who are born before 38 weeks of pregnancy. How is preterm labor treated? Treatment depends on the length of your pregnancy, your condition,  and the health of your baby. It may involve:  Having a stitch (suture) placed in your cervix to prevent your cervix from opening too early (cerclage).  Taking or being given medicines, such as: ? Hormone medicines. These may be given early in pregnancy to help support the pregnancy. ? Medicine to stop contractions. ? Medicines to help mature the babys lungs. These may be prescribed if the risk of delivery is high. ? Medicines to prevent your baby from developing cerebral palsy. If the labor happens before 34 weeks of pregnancy, you may need to stay in the hospital. What should I do if I think I am in preterm labor? If you think that you are going into preterm labor, call your health care provider right away. How can I prevent preterm labor in future pregnancies? To increase your chance of having a full-term pregnancy:  Do not use any tobacco products, such as cigarettes, chewing tobacco, and e-cigarettes. If you need help quitting, ask your health care provider.  Do not use street drugs or medicines that have not been prescribed to you during your pregnancy.  Talk with your health care provider before taking any herbal supplements, even if you have been taking them regularly.  Make sure you gain a healthy amount of weight during your pregnancy.  Watch for infection. If you think that you might have an infection, get it checked right away.  Make sure to tell your health care provider if you have gone into preterm labor before. This information is not intended to replace advice given to you by your health care provider. Make sure you discuss any questions you have with your  health care provider. Document Released: 05/19/2003 Document Revised: 06/20/2018 Document Reviewed: 07/20/2015 Elsevier Patient Education  [place] of Pregnancy The second trimester is from week 14 through week 27 (months 4 through 6). The second trimester is often a time when you feel your  best. Your body has adjusted to being pregnant, and you begin to feel better physically. Usually, morning sickness has lessened or quit completely, you may have more energy, and you may have an increase in appetite. The second trimester is also a time when the fetus is growing rapidly. At the end of the sixth month, the fetus is about 9 inches long and weighs about 1 pounds. You will likely begin to feel the baby move (quickening) between 16 and 20 weeks of pregnancy. Body changes during your second trimester Your body continues to go through many changes during your second trimester. The changes vary from woman to woman.  Your weight will continue to increase. You will notice your lower abdomen bulging out.  You may begin to get stretch marks on your hips, abdomen, and breasts.  You may develop headaches that can be relieved by medicines. The medicines should be approved by your health care provider.  You may urinate more often because the fetus is pressing on your bladder.  You may develop or continue to have heartburn as a result of your pregnancy.  You may develop constipation because certain hormones are causing the muscles that push waste through your intestines to slow down.  You may develop hemorrhoids or swollen, bulging veins (varicose veins).  You may have back pain. This is caused by: ? Weight gain. ? Pregnancy hormones that are relaxing the joints in your pelvis. ? A shift in weight and the muscles that support your balance.  Your breasts will continue to grow and they will continue to become tender.  Your gums may bleed and may be sensitive to brushing and flossing.  Dark spots or blotches (chloasma, mask of pregnancy) may develop on your face. This will likely fade after the baby is born.  A dark line from your belly button to the pubic area (linea nigra) may appear. This will likely fade after the baby is born.  You may have changes in your hair. These can include  thickening of your hair, rapid growth, and changes in texture. Some women also have hair loss during or after pregnancy, or hair that feels dry or thin. Your hair will most likely return to normal after your baby is born. What to expect at prenatal visits During a routine prenatal visit:  You will be weighed to make sure you and the fetus are growing normally.  Your blood pressure will be taken.  Your abdomen will be measured to track your baby's growth.  The fetal heartbeat will be listened to.  Any test results from the previous visit will be discussed. Your health care provider may ask you:  How you are feeling.  If you are feeling the baby move.  If you have had any abnormal symptoms, such as leaking fluid, bleeding, severe headaches, or abdominal cramping.  If you are using any tobacco products, including cigarettes, chewing tobacco, and electronic cigarettes.  If you have any questions. Other tests that may be performed during your second trimester include:  Blood tests that check for: ? Low iron levels (anemia). ? High blood sugar that affects pregnant women (gestational diabetes) between 44 and 28 weeks. ? Rh antibodies. This is to check for  a protein on red blood cells (Rh factor).  Urine tests to check for infections, diabetes, or protein in the urine.  An ultrasound to confirm the proper growth and development of the baby.  An amniocentesis to check for possible genetic problems.  Fetal screens for spina bifida and Down syndrome.  HIV (human immunodeficiency virus) testing. Routine prenatal testing includes screening for HIV, unless you choose not to have this test. Follow these instructions at home: Medicines  Follow your health care provider's instructions regarding medicine use. Specific medicines may be either safe or unsafe to take during pregnancy.  Take a prenatal vitamin that contains at least 600 micrograms (mcg) of folic acid.  If you develop  constipation, try taking a stool softener if your health care provider approves. Eating and drinking   Eat a balanced diet that includes fresh fruits and vegetables, whole grains, good sources of protein such as meat, eggs, or tofu, and low-fat dairy. Your health care provider will help you determine the amount of weight gain that is right for you.  Avoid raw meat and uncooked cheese. These carry germs that can cause birth defects in the baby.  If you have low calcium intake from food, talk to your health care provider about whether you should take a daily calcium supplement.  Limit foods that are high in fat and processed sugars, such as fried and sweet foods.  To prevent constipation: ? Drink enough fluid to keep your urine clear or pale yellow. ? Eat foods that are high in fiber, such as fresh fruits and vegetables, whole grains, and beans. Activity  Exercise only as directed by your health care provider. Most women can continue their usual exercise routine during pregnancy. Try to exercise for 30 minutes at least 5 days a week. Stop exercising if you experience uterine contractions.  Avoid heavy lifting, wear low heel shoes, and practice good posture.  A sexual relationship may be continued unless your health care provider directs you otherwise. Relieving pain and discomfort  Wear a good support bra to prevent discomfort from breast tenderness.  Take warm sitz baths to soothe any pain or discomfort caused by hemorrhoids. Use hemorrhoid cream if your health care provider approves.  Rest with your legs elevated if you have leg cramps or low back pain.  If you develop varicose veins, wear support hose. Elevate your feet for 15 minutes, 3-4 times a day. Limit salt in your diet. Prenatal Care  Write down your questions. Take them to your prenatal visits.  Keep all your prenatal visits as told by your health care provider. This is important. Safety  Wear your seat belt at all  times when driving.  Make a list of emergency phone numbers, including numbers for family, friends, the hospital, and police and fire departments. General instructions  Ask your health care provider for a referral to a local prenatal education class. Begin classes no later than the beginning of month 6 of your pregnancy.  Ask for help if you have counseling or nutritional needs during pregnancy. Your health care provider can offer advice or refer you to specialists for help with various needs.  Do not use hot tubs, steam rooms, or saunas.  Do not douche or use tampons or scented sanitary pads.  Do not cross your legs for long periods of time.  Avoid cat litter boxes and soil used by cats. These carry germs that can cause birth defects in the baby and possibly loss of the fetus by  miscarriage or stillbirth.  Avoid all smoking, herbs, alcohol, and unprescribed drugs. Chemicals in these products can affect the formation and growth of the baby.  Do not use any products that contain nicotine or tobacco, such as cigarettes and e-cigarettes. If you need help quitting, ask your health care provider.  Visit your dentist if you have not gone yet during your pregnancy. Use a soft toothbrush to brush your teeth and be gentle when you floss. Contact a health care provider if:  You have dizziness.  You have mild pelvic cramps, pelvic pressure, or nagging pain in the abdominal area.  You have persistent nausea, vomiting, or diarrhea.  You have a bad smelling vaginal discharge.  You have pain when you urinate. Get help right away if:  You have a fever.  You are leaking fluid from your vagina.  You have spotting or bleeding from your vagina.  You have severe abdominal cramping or pain.  You have rapid weight gain or weight loss.  You have shortness of breath with chest pain.  You notice sudden or extreme swelling of your face, hands, ankles, feet, or legs.  You have not felt your baby  move in over an hour.  You have severe headaches that do not go away when you take medicine.  You have vision changes. Summary  The second trimester is from week 14 through week 27 (months 4 through 6). It is also a time when the fetus is growing rapidly.  Your body goes through many changes during pregnancy. The changes vary from woman to woman.  Avoid all smoking, herbs, alcohol, and unprescribed drugs. These chemicals affect the formation and growth your baby.  Do not use any tobacco products, such as cigarettes, chewing tobacco, and e-cigarettes. If you need help quitting, ask your health care provider.  Contact your health care provider if you have any questions. Keep all prenatal visits as told by your health care provider. This is important. This information is not intended to replace advice given to you by your health care provider. Make sure you discuss any questions you have with your health care provider. Document Released: 02/20/2001 Document Revised: 06/20/2018 Document Reviewed: 04/03/2016 Elsevier Patient Education  [place]. Preeclampsia and Eclampsia Preeclampsia is a serious condition that may develop during pregnancy. This condition causes high blood pressure and increased protein in your urine along with other symptoms, such as headaches and vision changes. These symptoms may develop as the condition gets worse. Preeclampsia may occur at 20 weeks of pregnancy or later. Diagnosing and treating preeclampsia early is very important. If not treated early, it can cause serious problems for you and your baby. One problem it can lead to is eclampsia. Eclampsia is a condition that causes muscle jerking or shaking (convulsions or seizures) and other serious problems for the mother. During pregnancy, delivering your baby may be the best treatment for preeclampsia or eclampsia. For most women, preeclampsia and eclampsia symptoms go away after giving birth. In rare cases, a  woman may develop preeclampsia after giving birth (postpartum preeclampsia). This usually occurs within 48 hours after childbirth but may occur up to 6 weeks after giving birth. What are the causes? The cause of preeclampsia is not known. What increases the risk? The following risk factors make you more likely to develop preeclampsia:  Being pregnant for the first time.  Having had preeclampsia during a past pregnancy.  Having a family history of preeclampsia.  Having high blood pressure.  Being pregnant with  more than one baby.  Being 81 or older.  Being African-American.  Having kidney disease or diabetes.  Having medical conditions such as lupus or blood diseases.  Being very overweight (obese). What are the signs or symptoms? The most common symptoms are:  Severe headaches.  Vision problems, such as blurred or double vision.  Abdominal pain, especially upper abdominal pain. Other symptoms that may develop as the condition gets worse include:  Sudden weight gain.  Sudden swelling of the hands, face, legs, and feet.  Severe nausea and vomiting.  Numbness in the face, arms, legs, and feet.  Dizziness.  Urinating less than usual.  Slurred speech.  Convulsions or seizures. How is this diagnosed? There are no screening tests for preeclampsia. Your health care provider will ask you about symptoms and check for signs of preeclampsia during your prenatal visits. You may also have tests that include:  Checking your blood pressure.  Urine tests to check for protein. Your health care provider will check for this at every prenatal visit.  Blood tests.  Monitoring your baby's heart rate.  Ultrasound. How is this treated? You and your health care provider will determine the treatment approach that is best for you. Treatment may include:  Having more frequent prenatal exams to check for signs of preeclampsia, if you have an increased risk for  preeclampsia.  Medicine to lower your blood pressure.  Staying in the hospital, if your condition is severe. There, treatment will focus on controlling your blood pressure and the amount of fluids in your body (fluid retention).  Taking medicine (magnesium sulfate) to prevent seizures. This may be given as an injection or through an IV.  Taking a low-dose aspirin during your pregnancy.  Delivering your baby early. You may have your labor started with medicine (induced), or you may have a cesarean delivery. Follow these instructions at home: Eating and drinking   Drink enough fluid to keep your urine pale yellow.  Avoid caffeine. Lifestyle  Do not use any products that contain nicotine or tobacco, such as cigarettes and e-cigarettes. If you need help quitting, ask your health care provider.  Do not use alcohol or drugs.  Avoid stress as much as possible. Rest and get plenty of sleep. General instructions  Take over-the-counter and prescription medicines only as told by your health care provider.  When lying down, lie on your left side. This keeps pressure off your major blood vessels.  When sitting or lying down, raise (elevate) your feet. Try putting some pillows underneath your lower legs.  Exercise regularly. Ask your health care provider what kinds of exercise are best for you.  Keep all follow-up and prenatal visits as told by your health care provider. This is important. How is this prevented? There is no known way of preventing preeclampsia or eclampsia from developing. However, to lower your risk of complications and detect problems early:  Get regular prenatal care. Your health care provider may be able to diagnose and treat the condition early.  Maintain a healthy weight. Ask your health care provider for help managing weight gain during pregnancy.  Work with your health care provider to manage any long-term (chronic) health conditions you have, such as diabetes or  kidney problems.  You may have tests of your blood pressure and kidney function after giving birth.  Your health care provider may have you take low-dose aspirin during your next pregnancy. Contact a health care provider if:  You have symptoms that your health care provider  told you may require more treatment or monitoring, such as: ? Headaches. ? Nausea or vomiting. ? Abdominal pain. ? Dizziness. ? Light-headedness. Get help right away if:  You have severe: ? Abdominal pain. ? Headaches that do not get better. ? Dizziness. ? Vision problems. ? Confusion. ? Nausea or vomiting.  You have any of the following: ? A seizure. ? Sudden, rapid weight gain. ? Sudden swelling in your hands, ankles, or face. ? Trouble moving any part of your body. ? Numbness in any part of your body. ? Trouble speaking. ? Abnormal bleeding.  You faint. Summary  Preeclampsia is a serious condition that may develop during pregnancy.  This condition causes high blood pressure and increased protein in your urine along with other symptoms, such as headaches and vision changes.  Diagnosing and treating preeclampsia early is very important. If not treated early, it can cause serious problems for you and your baby.  Get help right away if you have symptoms that your health care provider told you to watch for. This information is not intended to replace advice given to you by your health care provider. Make sure you discuss any questions you have with your health care provider. Document Released: 02/24/2000 Document Revised: 10/29/2017 Document Reviewed: 10/03/2015 Elsevier Patient Education  2020 [employer].

## 2018-12-24 NOTE — MAU Note (Signed)
Pt had episode of incontinence, shortly after emptying of bladder complained of sharp lower abdominal pain, and stated she had the urge to pee again, she was unable to pee while trying on the toilet but when standing up "it just came out everywhere on the floor" as stated by pt.

## 2018-12-24 NOTE — Telephone Encounter (Signed)
Patient called and stated that she was having blurred vision and feels like she is having a panic attack.  Explained to patient that her blood pressure could be up and that our clinic RN is working at another office. Patient said something doesn't feel right.  Advised patient to go to MAU for further evaluation.  Patient verbalized understanding and said that her sister will be taking her.

## 2018-12-26 ENCOUNTER — Other Ambulatory Visit: Payer: Self-pay

## 2018-12-26 ENCOUNTER — Other Ambulatory Visit (HOSPITAL_COMMUNITY): Payer: Self-pay | Admitting: *Deleted

## 2018-12-26 ENCOUNTER — Ambulatory Visit (HOSPITAL_COMMUNITY)
Admission: RE | Admit: 2018-12-26 | Discharge: 2018-12-26 | Disposition: A | Discharge status: Home / Self Care | Payer: Medicaid Other | Source: Ambulatory Visit | Attending: Obstetrics and Gynecology | Admitting: Obstetrics and Gynecology

## 2018-12-26 ENCOUNTER — Other Ambulatory Visit: Payer: Self-pay | Admitting: Obstetrics and Gynecology

## 2018-12-26 DIAGNOSIS — O09292 Supervision of pregnancy with other poor reproductive or obstetric history, second trimester: Secondary | ICD-10-CM

## 2018-12-26 DIAGNOSIS — O0990 Supervision of high risk pregnancy, unspecified, unspecified trimester: Secondary | ICD-10-CM | POA: Diagnosis present

## 2018-12-26 DIAGNOSIS — O99212 Obesity complicating pregnancy, second trimester: Secondary | ICD-10-CM | POA: Diagnosis present

## 2018-12-26 DIAGNOSIS — Z3A21 21 weeks gestation of pregnancy: Secondary | ICD-10-CM

## 2018-12-26 DIAGNOSIS — Z362 Encounter for other antenatal screening follow-up: Secondary | ICD-10-CM

## 2019-01-01 ENCOUNTER — Encounter: Payer: Self-pay | Admitting: General Practice

## 2019-01-02 ENCOUNTER — Encounter: Payer: Self-pay | Admitting: Family

## 2019-01-02 ENCOUNTER — Other Ambulatory Visit: Payer: Self-pay

## 2019-01-02 ENCOUNTER — Ambulatory Visit (INDEPENDENT_AMBULATORY_CARE_PROVIDER_SITE_OTHER): Payer: Medicaid Other | Admitting: Family

## 2019-01-02 ENCOUNTER — Other Ambulatory Visit (HOSPITAL_COMMUNITY)
Admission: RE | Admit: 2019-01-02 | Discharge: 2019-01-02 | Disposition: A | Discharge status: Home / Self Care | Payer: Medicaid Other | Source: Ambulatory Visit | Attending: Family | Admitting: Family

## 2019-01-02 VITALS — BP 132/83 | HR 72 | Wt 230.8 lb

## 2019-01-02 DIAGNOSIS — Z3A22 22 weeks gestation of pregnancy: Secondary | ICD-10-CM | POA: Diagnosis not present

## 2019-01-02 DIAGNOSIS — O10912 Unspecified pre-existing hypertension complicating pregnancy, second trimester: Secondary | ICD-10-CM

## 2019-01-02 DIAGNOSIS — O0990 Supervision of high risk pregnancy, unspecified, unspecified trimester: Secondary | ICD-10-CM | POA: Insufficient documentation

## 2019-01-02 DIAGNOSIS — O0992 Supervision of high risk pregnancy, unspecified, second trimester: Secondary | ICD-10-CM | POA: Diagnosis not present

## 2019-01-02 DIAGNOSIS — O10919 Unspecified pre-existing hypertension complicating pregnancy, unspecified trimester: Secondary | ICD-10-CM

## 2019-01-02 MED ORDER — BLOOD GLUCOSE MONITORING SUPPL KIT: PACK | 0 refills | Status: DC

## 2019-01-02 MED ORDER — ASPIRIN 81 MG PO CHEW: 81.0000 mg | CHEWABLE_TABLET | Freq: Every day | ORAL | 2 refills | Status: DC

## 2019-01-02 NOTE — Progress Notes (Signed)
   PRENATAL VISIT NOTE  Subjective:  Leslie Skinner is a 23 y.o. G2P1001 at [redacted]w[redacted]d being seen today for ongoing prenatal care.  She is currently monitored for the following issues for this high-risk pregnancy and has Chronic hypertension during pregnancy, antepartum; GERD (gastroesophageal reflux disease); Migraine; Scoliosis; Obesity, Class III, BMI 40-49.9 (morbid obesity) ([place]); Anxiety state, unspecified; Pre-diabetes; Pyelonephritis affecting pregnancy; Current mild episode of major depressive disorder ([place]); Chronic bilateral thoracic back pain; and Supervision of high risk pregnancy, antepartum on their problem list.  Pt states was diagnosed with hypertension by a PCP, no meds were required. Also states diagnosed with diabetes as a child but never took medication.    Patient reports depressive symptoms, denies suicidal ideation.  Missed appt with [name], desires to have it rescheduled.  Reports stress 10/10, not currently utilizing self-care measures or stress reduction activities.  Stress related to having a [age] year old child and pregnancy being unexpected.  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  Contractions: Not present.  .  Movement: Present. Denies leaking of fluid.   The following portions of the patient's history were reviewed and updated as appropriate: allergies, current medications, past family history, past medical history, past social history, past surgical history and problem list.   Objective:   Vitals:   01/02/19 1040  BP: 132/83  Pulse: 72  Weight: 230 lb 12.8 oz (104.7 kg)    Fetal Status: Fetal Heart Rate (bpm): 155 Fundal Height: 23 cm Movement: Present     General:  Alert, oriented and cooperative. Patient is in no acute distress.  Skin: Skin is warm and dry. No rash noted.   Cardiovascular: Normal heart rate noted  Respiratory: Normal respiratory effort, no problems with respiration noted  Abdomen: Soft, gravid, appropriate for gestational age.   Pain/Pressure: Absent     Pelvic: Cervical exam performed        Extremities: Normal range of motion.  Edema: None  Mental Status: Normal mood and affect. Normal behavior. Normal judgment and thought content.   Assessment and Plan:  Pregnancy: G2P1001 at [redacted]w[redacted]d 1. Supervision of high risk pregnancy, antepartum - Babyscripts Schedule Optimization to enter blood pressures; will not follow that schedule due to chronic health conditions - Cytology - PAP( [place]) - Discussed incorporating stress reduction activities (walking, journaling)  Will reschedule with [name], [place]  2. Chronic hypertension affecting pregnancy - aspirin 81 MG chewable tablet; Chew 1 tablet (81 mg total) by mouth daily.  Dispense: 30 tablet; Refill: 2 - Transfer to either [facility] or [facility] due to chronic health conditions - Follow-up ultrasound already scheduled for 01/23/2019  Preterm labor symptoms and general obstetric precautions including but not limited to vaginal bleeding, contractions, leaking of fluid and fetal movement were reviewed in detail with the patient. Please refer to After Visit Summary for other counseling recommendations.   No follow-ups on file.  Future Appointments  Date Time Provider [place]  01/06/2019  8:45 AM [place]  01/23/2019  1:45 PM [place] [id number]  01/29/2019  8:30 AM Laury Deep, CNM [facility] None    Cyndee Brightly, CNM

## 2019-01-02 NOTE — Progress Notes (Signed)
Patient reports last pap was 2019. The system has last pap 2017 [facility]. Declined flu Baby scripts

## 2019-01-06 ENCOUNTER — Ambulatory Visit (INDEPENDENT_AMBULATORY_CARE_PROVIDER_SITE_OTHER): Payer: Medicaid Other | Admitting: Clinical

## 2019-01-06 ENCOUNTER — Other Ambulatory Visit: Payer: Self-pay

## 2019-01-06 DIAGNOSIS — F4323 Adjustment disorder with mixed anxiety and depressed mood: Secondary | ICD-10-CM | POA: Diagnosis not present

## 2019-01-06 LAB — CYTOLOGY - PAP
Comment: NEGATIVE
Diagnosis: NEGATIVE
High risk HPV: NEGATIVE

## 2019-01-06 NOTE — BH Specialist Note (Signed)
Integrated Behavioral Health via Telemedicine Video Visit  01/06/2019 Leslie Skinner [id number]  Number of [place] visits: 1 (3 total) Session Start time: 8:45  Session End time: 9:05 Total time: 20  Referring Provider: Laury Deep, CNM Type of Visit: Video Patient/Family location: Home [facility] Provider location: [facility] All persons participating in visit: Patient Leslie Skinner and [place]  Confirmed patient's address: Yes  Confirmed patient's phone number: Yes  Any changes to demographics: No   Confirmed patient's insurance: Yes  Any changes to patient's insurance: No   Discussed confidentiality: Yes   I connected with Leslie Skinner by a video enabled telemedicine application and verified that I am speaking with the correct person using two identifiers.     I discussed the limitations of evaluation and management by telemedicine and the availability of in person appointments.  I discussed that the purpose of this visit is to provide behavioral health care while limiting exposure to the novel coronavirus.   Discussed there is a possibility of technology failure and discussed alternative modes of communication if that failure occurs.  I discussed that engaging in this video visit, they consent to the provision of behavioral healthcare and the services will be billed under their insurance.  Patient and/or legal guardian expressed understanding and consented to video visit: Yes   PRESENTING CONCERNS: Patient and/or family reports the following symptoms/concerns: Pt states her primary concern today is feeling more anxious and depressed during pregnancy, and is interested in starting medication to prevent escalation of symptoms that she experienced during her last pregnancy/postpartum. Pt feels her symptoms have escalated during covid pandemic, as it is affecting her ability to obtain childcare while she works virtually.  Duration of problem:  Current pregnancy; Severity of problem: moderate  STRENGTHS (Protective Factors/Coping Skills): Open to treatment  GOALS ADDRESSED: Patient will: 1.  Reduce symptoms of: anxiety, depression and stress  2.  Increase knowledge and/or ability of: healthy habits and stress reduction  3.  Demonstrate ability to: Increase healthy adjustment to current life circumstances, Increase adequate support systems for patient/family and Increase motivation to adhere to plan of care  INTERVENTIONS: Interventions utilized:  Solution-Focused Strategies and Psychoeducation and/or Health Education Standardized Assessments completed: Not Needed  ASSESSMENT: Patient currently experiencing Adjustment disorder with mixed anxiety and depression.   Patient may benefit from psychoeducation and brief therapeutic interventions regarding coping with symptoms of anxiety and depression .  PLAN: 1. Follow up with behavioral health clinician on : One week  2. Behavioral recommendations:  -Begin taking BH medication as prescribed -Sign [id number] consent sent to phone text today, for childcare referral 3. Referral(s): Integrated Orthoptist (In Clinic) and [employer] Resources:  Childcare  I discussed the assessment and treatment plan with the patient and/or parent/guardian. They were provided an opportunity to ask questions and all were answered. They agreed with the plan and demonstrated an understanding of the instructions.   They were advised to call back or seek an in-person evaluation if the symptoms worsen or if the condition fails to improve as anticipated.  Caroleen Hamman [facility]  Depression screen [facility] 2/9 01/02/2019 12/15/2018 08/05/2018 10/21/2017 10/02/2017  Decreased Interest 1 1 1 1 2   Down, Depressed, Hopeless 2 2 1 1 2   PHQ - 2 Score 3 3 2 2 4   Altered sleeping 1 1 - 2 2  Tired, decreased energy 2 2 - 0 2  Change in appetite 0 1 - 1 2  Feeling bad or failure about  yourself  0 1 - 0 0   Trouble concentrating 1 1 - 0 0  Moving slowly or fidgety/restless 0 0 - 0 0  Suicidal thoughts 0 0 - 0 0  PHQ-9 Score 7 9 - 5 10  Difficult doing work/chores - Not difficult at all - - -  Some recent data might be hidden   GAD 7 : Generalized Anxiety Score 01/02/2019 12/15/2018 10/21/2017 10/02/2017  Nervous, Anxious, on Edge 1 2 1 1   Control/stop worrying 1 2 1 1   Worry too much - different things 1 2 1 1   Trouble relaxing 0 1 1 1   Restless 1 1 0 0  Easily annoyed or irritable 2 2 1 3   Afraid - awful might happen 0 1 0 0  Total GAD 7 Score 6 11 5 7   Anxiety Difficulty - Not difficult at all - -

## 2019-01-09 ENCOUNTER — Telehealth: Payer: Self-pay | Admitting: Clinical

## 2019-01-09 NOTE — Telephone Encounter (Signed)
Integrated Behavioral Health Medication Management Phone Note  MRN: [id number] NAME: Leslie Skinner  Time Call Initiated: 10:29 Time Call Completed: 10:31 Total Call Time: 2 minutes  Current Medications:  Outpatient Medications Prior to Visit  Medication Sig Dispense Refill  . acetaminophen (TYLENOL) 500 MG tablet Take 1 tablet (500 mg total) by mouth every 6 (six) hours as needed. (Patient not taking: Reported on 01/02/2019) 30 tablet 0  . aspirin 81 MG chewable tablet Chew 1 tablet (81 mg total) by mouth daily. 30 tablet 2  . Blood Glucose Monitoring Suppl KIT Check blood pressure 1-2 times per week 1 kit 0  . cyclobenzaprine (FLEXERIL) 10 MG tablet Take 1 tablet (10 mg total) by mouth 3 (three) times daily as needed for muscle spasms. (Patient not taking: Reported on 01/02/2019) 30 tablet 0  . Elastic Bandages & Supports (COMFORT FIT MATERNITY SUPP LG) MISC 1 Units by Does not apply route daily. (Patient not taking: Reported on 01/02/2019) 1 each 0  . potassium chloride (KLOR-CON) 10 MEQ tablet Take 1 tablet (10 mEq total) by mouth daily. (Patient not taking: Reported on 01/02/2019) 4 tablet 0  . Prenatal Vit-Fe Fumarate-FA (PRENATAL COMPLETE) 14-0.4 MG TABS Take 1 tablet by mouth daily. (Patient not taking: Reported on 01/02/2019) 30 tablet 12   No facility-administered medications prior to visit.     Patient has been able to get all medications filled as prescribed: No: Prescription has not been sent to pharmacy.  Additional patient concerns: Not at this time  Patient advised to schedule appointment with provider for evaluation of medication side effects or additional concerns: NoVisits already scheduled   [name], LCSW

## 2019-01-12 ENCOUNTER — Inpatient Hospital Stay (HOSPITAL_COMMUNITY)
Admission: AD | Admit: 2019-01-12 | Discharge: 2019-01-12 | Disposition: A | Discharge status: Home / Self Care | Payer: Medicaid Other | Attending: Obstetrics & Gynecology | Admitting: Obstetrics & Gynecology

## 2019-01-12 ENCOUNTER — Other Ambulatory Visit: Payer: Self-pay

## 2019-01-12 ENCOUNTER — Encounter (HOSPITAL_COMMUNITY): Payer: Self-pay

## 2019-01-12 DIAGNOSIS — Z823 Family history of stroke: Secondary | ICD-10-CM | POA: Insufficient documentation

## 2019-01-12 DIAGNOSIS — E669 Obesity, unspecified: Secondary | ICD-10-CM | POA: Diagnosis not present

## 2019-01-12 DIAGNOSIS — O0992 Supervision of high risk pregnancy, unspecified, second trimester: Secondary | ICD-10-CM | POA: Diagnosis not present

## 2019-01-12 DIAGNOSIS — O99891 Other specified diseases and conditions complicating pregnancy: Secondary | ICD-10-CM | POA: Diagnosis present

## 2019-01-12 DIAGNOSIS — Z885 Allergy status to narcotic agent status: Secondary | ICD-10-CM | POA: Insufficient documentation

## 2019-01-12 DIAGNOSIS — O132 Gestational [pregnancy-induced] hypertension without significant proteinuria, second trimester: Secondary | ICD-10-CM | POA: Diagnosis not present

## 2019-01-12 DIAGNOSIS — W19XXXA Unspecified fall, initial encounter: Secondary | ICD-10-CM

## 2019-01-12 DIAGNOSIS — E119 Type 2 diabetes mellitus without complications: Secondary | ICD-10-CM | POA: Insufficient documentation

## 2019-01-12 DIAGNOSIS — Z8249 Family history of ischemic heart disease and other diseases of the circulatory system: Secondary | ICD-10-CM | POA: Diagnosis not present

## 2019-01-12 DIAGNOSIS — M419 Scoliosis, unspecified: Secondary | ICD-10-CM | POA: Diagnosis not present

## 2019-01-12 DIAGNOSIS — Z3A23 23 weeks gestation of pregnancy: Secondary | ICD-10-CM

## 2019-01-12 DIAGNOSIS — O24112 Pre-existing diabetes mellitus, type 2, in pregnancy, second trimester: Secondary | ICD-10-CM | POA: Insufficient documentation

## 2019-01-12 DIAGNOSIS — O162 Unspecified maternal hypertension, second trimester: Secondary | ICD-10-CM | POA: Diagnosis not present

## 2019-01-12 DIAGNOSIS — Z888 Allergy status to other drugs, medicaments and biological substances status: Secondary | ICD-10-CM | POA: Diagnosis not present

## 2019-01-12 DIAGNOSIS — M7918 Myalgia, other site: Secondary | ICD-10-CM | POA: Diagnosis not present

## 2019-01-12 DIAGNOSIS — O99212 Obesity complicating pregnancy, second trimester: Secondary | ICD-10-CM | POA: Diagnosis not present

## 2019-01-12 DIAGNOSIS — W010XXA Fall on same level from slipping, tripping and stumbling without subsequent striking against object, initial encounter: Secondary | ICD-10-CM | POA: Insufficient documentation

## 2019-01-12 DIAGNOSIS — Z7722 Contact with and (suspected) exposure to environmental tobacco smoke (acute) (chronic): Secondary | ICD-10-CM | POA: Diagnosis not present

## 2019-01-12 DIAGNOSIS — O26892 Other specified pregnancy related conditions, second trimester: Secondary | ICD-10-CM | POA: Diagnosis not present

## 2019-01-12 DIAGNOSIS — O9A212 Injury, poisoning and certain other consequences of external causes complicating pregnancy, second trimester: Secondary | ICD-10-CM | POA: Diagnosis not present

## 2019-01-12 DIAGNOSIS — Z833 Family history of diabetes mellitus: Secondary | ICD-10-CM | POA: Insufficient documentation

## 2019-01-12 DIAGNOSIS — Z809 Family history of malignant neoplasm, unspecified: Secondary | ICD-10-CM | POA: Insufficient documentation

## 2019-01-12 DIAGNOSIS — O0990 Supervision of high risk pregnancy, unspecified, unspecified trimester: Secondary | ICD-10-CM

## 2019-01-12 DIAGNOSIS — Z8349 Family history of other endocrine, nutritional and metabolic diseases: Secondary | ICD-10-CM | POA: Insufficient documentation

## 2019-01-12 DIAGNOSIS — R102 Pelvic and perineal pain: Secondary | ICD-10-CM | POA: Diagnosis not present

## 2019-01-12 LAB — URINALYSIS, ROUTINE W REFLEX MICROSCOPIC
Bilirubin Urine: NEGATIVE
Glucose, UA: NEGATIVE mg/dL
Hgb urine dipstick: NEGATIVE
Ketones, ur: NEGATIVE mg/dL
Leukocytes,Ua: NEGATIVE
Nitrite: NEGATIVE
Protein, ur: NEGATIVE mg/dL
Specific Gravity, Urine: 1.013 (ref 1.005–1.030)
pH: 6 (ref 5.0–8.0)

## 2019-01-12 MED ORDER — CYCLOBENZAPRINE HCL 10 MG PO TABS: 10.0000 mg | ORAL_TABLET | Freq: Three times a day (TID) | ORAL | Status: DC | PRN

## 2019-01-12 MED ORDER — ACETAMINOPHEN 500 MG PO TABS
1000.0000 mg | ORAL_TABLET | Freq: Four times a day (QID) | ORAL | Status: DC | PRN
  Administered 2019-01-12: 1000 mg via ORAL
  Filled 2019-01-12: qty 2

## 2019-01-12 MED ORDER — CYCLOBENZAPRINE HCL 10 MG PO TABS: 10.0000 mg | ORAL_TABLET | Freq: Three times a day (TID) | ORAL | 0 refills | Status: DC | PRN

## 2019-01-12 NOTE — Discharge Instructions (Signed)
Preventing Injuries During Pregnancy  Injuries can happen during pregnancy. Minor falls and accidents usually do not harm you or your baby. But some injuries can harm you and your baby. Tell your doctor about any injury you suffer. What can I do to avoid injuries? Safety  Remove rugs and loose objects on the floor.  Wear comfortable shoes that have a good grip. Do not wear shoes that have high heels.  Always wear your seat belt in the car. The lap belt should be below your belly. Always drive safely.  Do not ride on a motorcycle. Activity  Do not take part in rough and violent activities or sports.  Avoid: ? Walking on wet or slippery floors. ? Lifting heavy pots of boiling or hot liquids. ? Fixing electrical problems. ? Being near fires. General instructions  Take over-the-counter and prescription medicines only as told by your doctor.  Know your blood type and the blood type of the baby's father.  If you are a victim of domestic violence: ? Call your local emergency services (911 in the [place] ? Contact the [employer] Violence Hotline for help and support. Get help right away if:  You fall on your belly or receive any serious blow to your belly.  You have a stiff neck or neck pain after a fall or an injury.  You get a headache or have problems with vision after an injury.  You do not feel the baby move or the baby is not moving as much as normal.  You have been a victim of domestic violence or any other kind of attack.  You have been in a car accident.  You have bleeding from your vagina.  Fluid is leaking from your vagina.  You start to have cramping or pain in your belly (contractions).  You have very bad pain in your lower back.  You feel weak or pass out (faint).  You start to throw up (vomit) after an injury.  You have been burned. Summary  Some injuries that happen during pregnancy can do harm to the baby.  Tell your doctor about any  injury.  Take steps to avoid injury. This includes removing rugs and loose objects on the floor. Always wear your seat belt in the car.  Do not take part in rough and violent activities or sports.  Get help right away if you have any serious accident or injury. This information is not intended to replace advice given to you by your health care provider. Make sure you discuss any questions you have with your health care provider. Document Released: 03/31/2010 Document Revised: 03/07/2016 Document Reviewed: 03/07/2016 Elsevier Patient Education  2020 [place].   Musculoskeletal Pain Musculoskeletal pain refers to aches and pains in your bones, joints, muscles, and the tissues that surround them. This pain can occur in any part of the body. It can last for a short time (acute) or a long time (chronic). A physical exam, lab tests, and imaging studies may be done to find the cause of your musculoskeletal pain. Follow these instructions at home:  Lifestyle  Try to control or lower your stress levels. Stress increases muscle tension and can worsen musculoskeletal pain. It is important to recognize when you are anxious or stressed and learn ways to manage it. This may include: ? Meditation or yoga. ? Cognitive or behavioral therapy. ? Acupuncture or massage therapy.  You may continue all activities unless the activities cause more pain. When the pain gets better, slowly  resume your normal activities. Gradually increase the intensity and duration of your activities or exercise. Managing pain, stiffness, and swelling  Take over-the-counter and prescription medicines only as told by your health care provider.  When your pain is severe, bed rest may be helpful. Lie or sit in any position that is comfortable, but get out of bed and walk around at least every couple of hours.  If directed, apply heat to the affected area as often as told by your health care provider. Use the heat source that  your health care provider recommends, such as a moist heat pack or a heating pad. ? Place a towel between your skin and the heat source. ? Leave the heat on for 20-30 minutes. ? Remove the heat if your skin turns bright red. This is especially important if you are unable to feel pain, heat, or cold. You may have a greater risk of getting burned.  If directed, put ice on the painful area. ? Put ice in a plastic bag. ? Place a towel between your skin and the bag. ? Leave the ice on for 20 minutes, 2-3 times a day. General instructions  Your health care provider may recommend that you see a physical therapist. This person can help you come up with a safe exercise program. Do any exercises as told by your physical therapist.  Keep all follow-up visits, including any physical therapy visits, as told by your health care providers. This is important. Contact a health care provider if:  Your pain gets worse.  Medicines do not help ease your pain.  You cannot use the part of your body that hurts, such as your arm, leg, or neck.  You have trouble sleeping.  You have trouble doing your normal activities. Get help right away if:  You have a new injury and your pain is worse or different.  You feel numb or you have tingling in the painful area. Summary  Musculoskeletal pain refers to aches and pains in your bones, joints, muscles, and the tissues that surround them.  This pain can occur in any part of the body.  Your health care provider may recommend that you see a physical therapist. This person can help you come up with a safe exercise program. Do any exercises as told by your physical therapist.  Lower your stress level. Stress can worsen musculoskeletal pain. Ways to lower stress may include meditation, yoga, cognitive or behavioral therapy, acupuncture, and massage therapy. This information is not intended to replace advice given to you by your health care provider. Make sure you  discuss any questions you have with your health care provider. Document Released: 02/26/2005 Document Revised: 02/08/2017 Document Reviewed: 03/28/2016 Elsevier Patient Education  2020 [employer].

## 2019-01-12 NOTE — MAU Provider Note (Signed)
History     CSN: [id number]  Arrival date and time: 01/12/19 8921   First Provider Initiated Contact with Patient 01/12/19 [phone]      Chief Complaint  Patient presents with  . Pelvic Pain  . Fall   22 y.o. G2P1001 '@23' .5 weeks presenting for pelvic pressure after a fall last night. She slipped and fell down 10+ stairs outside around midnight. She is unsure what she hit. She had no LOC. She was able to sleep after but woke up with pelvic pressure. Sx are only when she is standing. Denies VB, LOF, or ctx. Reports no FM since before fall. She has not taken anything for pain. She has had nothing po today.   OB History    Gravida  2   Para  1   Term  1   Preterm      AB      Living  1     SAB      TAB      Ectopic      Multiple  0   Live Births  1           Past Medical History:  Diagnosis Date  . Acanthosis nigricans, acquired   . Constipation   . Diabetes mellitus type II    Young age around 43 per pt  . GERD (gastroesophageal reflux disease)   . Goiter   . Hypertension   . Migraines   . Obesity   . Osteochondroma of lower leg   . Pollen allergies   . Pregnancy induced hypertension   . Scoliosis     Past Surgical History:  Procedure Laterality Date  . LOWER LEG SOFT TISSUE TUMOR EXCISION  2011  . OSTEOCHONDROMA EXCISION      Family History  Problem Relation Age of Onset  . Diabetes Mother   . Ulcers Mother   . Thyroid disease Mother   . Cancer Mother   . Obesity Mother   . Heart disease Mother   . Heart attack Mother        caused death   . Diabetes Maternal Grandmother   . Stroke Maternal Grandmother   . Ulcers Maternal Grandmother   . Diabetes Maternal Grandfather   . GER disease Maternal Grandfather   . Heart disease Maternal Grandfather   . GER disease Sister   . Diabetes Father   . GER disease Father   . Obesity Sister   . Diabetes Paternal Grandmother   . Thyroid disease Paternal Grandmother   . Diabetes Paternal Grandfather    . Heart disease Maternal Aunt   . Diabetes Maternal Aunt   . Thyroid disease Maternal Aunt   . Cancer Maternal Aunt   . Obesity Maternal Aunt   . GER disease Maternal Aunt     Social History   Tobacco Use  . Smoking status: Passive Smoke Exposure - Never Smoker  . Smokeless tobacco: Never Used  Substance Use Topics  . Alcohol use: No  . Drug use: No    Allergies:  Allergies  Allergen Reactions  . Morphine And Related Other (See Comments)    Hot flashes, can't breathe  . Tramadol Other (See Comments)    Hot flashes     Medications Prior to Admission  Medication Sig Dispense Refill Last Dose  . aspirin 81 MG chewable tablet Chew 1 tablet (81 mg total) by mouth daily. 30 tablet 2 01/11/2019 at Unknown time  . Blood Glucose Monitoring Suppl KIT Check blood pressure  1-2 times per week 1 kit 0 Past Month at Unknown time  . Prenatal Vit-Fe Fumarate-FA (PRENATAL COMPLETE) 14-0.4 MG TABS Take 1 tablet by mouth daily. 30 tablet 12 01/11/2019 at Unknown time  . acetaminophen (TYLENOL) 500 MG tablet Take 1 tablet (500 mg total) by mouth every 6 (six) hours as needed. (Patient not taking: Reported on 01/02/2019) 30 tablet 0   . Elastic Bandages & Supports (COMFORT FIT MATERNITY SUPP LG) MISC 1 Units by Does not apply route daily. (Patient not taking: Reported on 01/02/2019) 1 each 0   . potassium chloride (KLOR-CON) 10 MEQ tablet Take 1 tablet (10 mEq total) by mouth daily. (Patient not taking: Reported on 01/02/2019) 4 tablet 0   . [DISCONTINUED] cyclobenzaprine (FLEXERIL) 10 MG tablet Take 1 tablet (10 mg total) by mouth 3 (three) times daily as needed for muscle spasms. (Patient not taking: Reported on 01/02/2019) 30 tablet 0     Review of Systems  Gastrointestinal: Negative for abdominal pain.  Genitourinary: Positive for pelvic pain. Negative for vaginal bleeding and vaginal discharge.  Musculoskeletal: Negative for back pain.   Physical Exam   Blood pressure (!) 110/54, pulse  90, temperature 98.8 F (37.1 C), temperature source Oral, resp. rate 19, weight 105.1 kg, last menstrual period 07/30/2018, SpO2 100 %, unknown if currently breastfeeding.  Physical Exam  Nursing note and vitals reviewed. Constitutional: She is oriented to person, place, and time. She appears well-nourished. No distress.  HENT:  Head: Normocephalic and atraumatic.  Neck: Normal range of motion.  Cardiovascular: Normal rate.  Respiratory: Effort normal. No respiratory distress.  GI: Soft. She exhibits no distension and no mass. There is abdominal tenderness (palpation to LMQ elcited SP discomfort). There is no rebound and no guarding.  Genitourinary:    Genitourinary Comments: VE: closed/long; no blood   Musculoskeletal: Normal range of motion.  Neurological: She is alert and oriented to person, place, and time.  Skin: Skin is warm and dry.  Psychiatric: She has a normal mood and affect.  EFM: 150 bpm, mod variability, + accels, rare mild variable decels Toco: none  Results for orders placed or performed during the hospital encounter of 01/12/19 (from the past 24 hour(s))  Urinalysis, Routine w reflex microscopic     Status: None   Collection Time: 01/12/19 11:13 AM  Result Value Ref Range   Color, Urine YELLOW YELLOW   APPearance CLEAR CLEAR   Specific Gravity, Urine 1.013 1.005 - 1.030   pH 6.0 5.0 - 8.0   Glucose, UA NEGATIVE NEGATIVE mg/dL   Hgb urine dipstick NEGATIVE NEGATIVE   Bilirubin Urine NEGATIVE NEGATIVE   Ketones, ur NEGATIVE NEGATIVE mg/dL   Protein, ur NEGATIVE NEGATIVE mg/dL   Nitrite NEGATIVE NEGATIVE   Leukocytes,Ua NEGATIVE NEGATIVE   MAU Course  Procedures Prolonged EFM  Meds ordered this encounter  Medications  . acetaminophen (TYLENOL) tablet 1,000 mg  . cyclobenzaprine (FLEXERIL) tablet 10 mg  . cyclobenzaprine (FLEXERIL) 10 MG tablet    Sig: Take 1 tablet (10 mg total) by mouth 3 (three) times daily as needed (back pain).    Dispense:  30 tablet     Refill:  0    Order Specific Question:   Supervising Provider    Answer:   [place] Mode [[id number]]   MDM Labs ordered and reviewed. FHT reassuring, FM audible on EFM. No evidence of PTL or abruption. Discussed comfort measures for MSK pain. Stable for discharge home.   Assessment and Plan  1. Musculoskeletal pain   2. Supervision of high risk pregnancy, antepartum   3. [redacted] weeks gestation of pregnancy   4. Fall, initial encounter    Discharge home Follow up at [facility] as scheduled PTL/abruption precautions Rx Flexeril Tylenol prn Heating pad prn  Allergies as of 01/12/2019      Reactions   Morphine And Related Other (See Comments)   Hot flashes, can't breathe   Tramadol Other (See Comments)   Hot flashes       Medication List    STOP taking these medications   potassium chloride 10 MEQ tablet Commonly known as: KLOR-CON     TAKE these medications   acetaminophen 500 MG tablet Commonly known as: TYLENOL Take 1 tablet (500 mg total) by mouth every 6 (six) hours as needed.   aspirin 81 MG chewable tablet Chew 1 tablet (81 mg total) by mouth daily.   Blood Glucose Monitoring Suppl Kit Check blood pressure 1-2 times per week   Comfort Fit Maternity Supp Lg Misc 1 Units by Does not apply route daily.   cyclobenzaprine 10 MG tablet Commonly known as: FLEXERIL Take 1 tablet (10 mg total) by mouth 3 (three) times daily as needed (back pain). What changed: reasons to take this   Prenatal Complete 14-0.4 MG Tabs Take 1 tablet by mouth daily.      Julianne Handler, CNM 01/12/2019, 12:42 PM

## 2019-01-12 NOTE — MAU Note (Signed)
Slipped on the stairs last night, went down the whole flight. Unsure exactly what happened, her sister said she was bouncing all over.  Denies bleeding or leaking.  C/o pain in pelvic, increased since last night. Reports +FM.

## 2019-01-14 ENCOUNTER — Telehealth: Payer: Self-pay | Admitting: *Deleted

## 2019-01-14 NOTE — Telephone Encounter (Signed)
Patient called to report that she fell down stairs on Sunday. She was seen at MAU on Monday 01/12/2019.  Derl Barrow, RN

## 2019-01-15 ENCOUNTER — Other Ambulatory Visit: Payer: Self-pay | Admitting: Obstetrics and Gynecology

## 2019-01-15 ENCOUNTER — Ambulatory Visit (INDEPENDENT_AMBULATORY_CARE_PROVIDER_SITE_OTHER): Payer: Medicaid Other | Admitting: Clinical

## 2019-01-15 ENCOUNTER — Other Ambulatory Visit: Payer: Self-pay

## 2019-01-15 DIAGNOSIS — F4323 Adjustment disorder with mixed anxiety and depressed mood: Secondary | ICD-10-CM

## 2019-01-15 DIAGNOSIS — F4321 Adjustment disorder with depressed mood: Secondary | ICD-10-CM | POA: Diagnosis not present

## 2019-01-15 MED ORDER — SERTRALINE HCL 50 MG PO TABS: 50.0000 mg | ORAL_TABLET | Freq: Every day | ORAL | 6 refills | Status: DC

## 2019-01-15 NOTE — Progress Notes (Signed)
Rx per request of [facility].

## 2019-01-15 NOTE — BH Specialist Note (Signed)
Integrated Behavioral Health via Telemedicine Video Visit  01/15/2019 DEMETRISS KO RL:[id number]  Number of [place] visits: 2(4 total) Session Start time: 8:19  Session End time: 8:39 Total time: 20  Referring Provider: Kathrine Haddock, CNM Type of Visit: Video Patient/Family location: Home [facility] Provider location: [facility] All persons participating in visit: Patient Leslie Skinner, [place] [facility] Intern Audria Nine  Confirmed patient's address: Yes  Confirmed patient's phone number: Yes  Any changes to demographics: No   Confirmed patient's insurance: Yes  Any changes to patient's insurance: No   Discussed confidentiality: At previous visit  I connected with [name]  by a video enabled telemedicine application and verified that I am speaking with the correct person using two identifiers.     I discussed the limitations of evaluation and management by telemedicine and the availability of in person appointments.  I discussed that the purpose of this visit is to provide behavioral health care while limiting exposure to the novel coronavirus.   Discussed there is a possibility of technology failure and discussed alternative modes of communication if that failure occurs.  I discussed that engaging in this video visit, they consent to the provision of behavioral healthcare and the services will be billed under their insurance.  Patient and/or legal guardian expressed understanding and consented to video visit: Yes   PRESENTING CONCERNS: Patient and/or family reports the following symptoms/concerns: Pt states her primary concern today is feeling depressed, as she is reminded that her mother passed two years ago in November(heart attack), and little support from FOB. Pt is starting a new job, working from home on Sunday, and plans to take her son on walks on her days off. Pt is requesting Zoloft as she took last pregnancy.  Duration of problem:  Current pregnancy escalation; Severity of problem: moderate  STRENGTHS (Protective Factors/Coping Skills): Open to treatment  GOALS ADDRESSED: Patient will: 1.  Reduce symptoms of: anxiety, depression and stress  2.  Increase knowledge and/or ability of: healthy habits  3.  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   Patient may benefit from continued brief therapeutic interventions regarding coping with symptoms of anxiety, depression, stress, and grief.  PLAN: 1. Follow up with behavioral health clinician on : One week 2. Behavioral recommendations:  -Begin taking Zoloft, as prescribed; continue taking prenatal vitamin daily -Take son on at least one outdoor walk on day off (Friday/Saturday); consider spending time outdoors for up to 20 minutes in the morning, before work 3. Referral(s): [facility] (In Clinic)  I discussed the assessment and treatment plan with the patient and/or parent/guardian. They were provided an opportunity to ask questions and all were answered. They agreed with the plan and demonstrated an understanding of the instructions.   They were advised to call back or seek an in-person evaluation if the symptoms worsen or if the condition fails to improve as anticipated.  Caroleen [facility]  Depression screen [facility] 2/9 01/02/2019 12/15/2018 08/05/2018 10/21/2017 10/02/2017  Decreased Interest 1 1 1 1 2   Down, Depressed, Hopeless 2 2 1 1 2   PHQ - 2 Score 3 3 2 2 4   Altered sleeping 1 1 - 2 2  Tired, decreased energy 2 2 - 0  2  Change in appetite 0 1 - 1 2  Feeling bad or failure about yourself  0 1 - 0 0   Trouble concentrating 1 1 - 0 0  Moving slowly or fidgety/restless 0 0 - 0 0  Suicidal thoughts 0 0 - 0 0  PHQ-9 Score 7 9 - 5 10  Difficult doing work/chores - Not difficult at all - - -  Some recent data might be hidden   GAD 7 : Generalized Anxiety Score 01/02/2019 12/15/2018 10/21/2017 10/02/2017  Nervous, Anxious, on Edge 1 2 1 1   Control/stop worrying 1 2 1 1   Worry too much - different things 1 2 1 1   Trouble relaxing 0 1 1 1   Restless 1 1 0 0  Easily annoyed or irritable 2 2 1 3   Afraid - awful might happen 0 1 0 0  Total GAD 7 Score 6 11 5 7   Anxiety Difficulty - Not difficult at all - -

## 2019-01-19 ENCOUNTER — Telehealth: Payer: Self-pay | Admitting: Clinical

## 2019-01-19 NOTE — Telephone Encounter (Signed)
Integrated Behavioral Health Medication Management Phone Note  MRN: [id number] NAME: Leslie Skinner  Time Call Initiated: 3:55 Time Call Completed: 3:59 Total Call Time: 4 minutes  Current Medications:  Outpatient Medications Prior to Visit  Medication Sig Dispense Refill  . acetaminophen (TYLENOL) 500 MG tablet Take 1 tablet (500 mg total) by mouth every 6 (six) hours as needed. (Patient not taking: Reported on 01/02/2019) 30 tablet 0  . aspirin 81 MG chewable tablet Chew 1 tablet (81 mg total) by mouth daily. 30 tablet 2  . Blood Glucose Monitoring Suppl KIT Check blood pressure 1-2 times per week 1 kit 0  . cyclobenzaprine (FLEXERIL) 10 MG tablet Take 1 tablet (10 mg total) by mouth 3 (three) times daily as needed (back pain). 30 tablet 0  . Elastic Bandages & Supports (COMFORT FIT MATERNITY SUPP LG) MISC 1 Units by Does not apply route daily. (Patient not taking: Reported on 01/02/2019) 1 each 0  . Prenatal Vit-Fe Fumarate-FA (PRENATAL COMPLETE) 14-0.4 MG TABS Take 1 tablet by mouth daily. 30 tablet 12  . sertraline (ZOLOFT) 50 MG tablet Take 1 tablet (50 mg total) by mouth daily. 30 tablet 6   No facility-administered medications prior to visit.     Patient has been able to get all medications filled as prescribed: No; pt plans to pick up today  Patient is currently taking all medications as prescribed: No: Will pick up today  Patient reports experiencing side effects: N/A  Patient describes feeling this way on medications: N/A  Additional patient concerns: Pt wants to know if Flexeril is safe to take in pregnancy; Medical providers confirm it is safe to take in pregnancy  Patient advised to schedule appointment with provider for evaluation of medication side effects or additional concerns: Yes- Will f/u with [facility] on 01/29/19   [name], LCSW

## 2019-01-21 ENCOUNTER — Other Ambulatory Visit: Payer: Self-pay

## 2019-01-21 ENCOUNTER — Other Ambulatory Visit (HOSPITAL_COMMUNITY): Payer: Self-pay | Admitting: *Deleted

## 2019-01-21 ENCOUNTER — Ambulatory Visit (HOSPITAL_COMMUNITY)
Admission: RE | Admit: 2019-01-21 | Discharge: 2019-01-21 | Disposition: A | Discharge status: Home / Self Care | Payer: Medicaid Other | Source: Ambulatory Visit | Attending: Obstetrics and Gynecology | Admitting: Obstetrics and Gynecology

## 2019-01-21 DIAGNOSIS — Z362 Encounter for other antenatal screening follow-up: Secondary | ICD-10-CM

## 2019-01-21 DIAGNOSIS — O99212 Obesity complicating pregnancy, second trimester: Secondary | ICD-10-CM

## 2019-01-21 DIAGNOSIS — Z3A25 25 weeks gestation of pregnancy: Secondary | ICD-10-CM

## 2019-01-21 DIAGNOSIS — O09292 Supervision of pregnancy with other poor reproductive or obstetric history, second trimester: Secondary | ICD-10-CM | POA: Diagnosis not present

## 2019-01-23 ENCOUNTER — Ambulatory Visit (HOSPITAL_COMMUNITY): Payer: Medicaid Other

## 2019-01-23 ENCOUNTER — Ambulatory Visit (HOSPITAL_COMMUNITY): Payer: Self-pay

## 2019-01-28 ENCOUNTER — Telehealth: Payer: Self-pay | Admitting: Clinical

## 2019-01-28 NOTE — Telephone Encounter (Signed)
Spoke to patient about her virtual appointment w/ [name] on 11/19 @ 9:15. Patient instructed that [name] will be giving her a call around her appointment time w/ further instructions on how to do the visit virtually. Patient instructed that she does not have to come to the office for the appointment. Patient verbalized understanding.

## 2019-01-29 ENCOUNTER — Encounter: Payer: Medicaid Other | Admitting: Obstetrics and Gynecology

## 2019-01-29 ENCOUNTER — Other Ambulatory Visit: Payer: Self-pay | Admitting: *Deleted

## 2019-01-29 ENCOUNTER — Other Ambulatory Visit: Payer: Self-pay

## 2019-01-29 ENCOUNTER — Ambulatory Visit: Payer: Medicaid Other | Admitting: Clinical

## 2019-01-29 DIAGNOSIS — Z91199 Patient's noncompliance with other medical treatment and regimen due to unspecified reason: Secondary | ICD-10-CM

## 2019-01-29 DIAGNOSIS — Z5329 Procedure and treatment not carried out because of patient's decision for other reasons: Secondary | ICD-10-CM

## 2019-01-29 MED ORDER — COMFORT FIT MATERNITY SUPP LG MISC: 1.0000 [IU] | Freq: Every day | 0 refills | Status: DC

## 2019-01-29 NOTE — BH Specialist Note (Signed)
Pt did not arrive to video visit and did not answer the phone ; Left HIPPA-compliant message to call back [name] from [facility] at [phone].  ; left MyChart message for patient.    [place] via Telemedicine Video Visit  01/29/2019 Leslie Skinner [id number]   [name]

## 2019-01-29 NOTE — Progress Notes (Signed)
Patient called requesting a reprint of the pregnancy belt Rx. Rx printed.   Derl Barrow, RN

## 2019-02-09 ENCOUNTER — Encounter (HOSPITAL_COMMUNITY): Payer: Self-pay | Admitting: *Deleted

## 2019-02-09 ENCOUNTER — Inpatient Hospital Stay (HOSPITAL_COMMUNITY)
Admission: AD | Admit: 2019-02-09 | Discharge: 2019-02-09 | Disposition: A | Discharge status: Home / Self Care | Payer: Medicaid Other | Attending: Obstetrics and Gynecology | Admitting: Obstetrics and Gynecology

## 2019-02-09 ENCOUNTER — Other Ambulatory Visit (INDEPENDENT_AMBULATORY_CARE_PROVIDER_SITE_OTHER): Payer: Medicaid Other

## 2019-02-09 ENCOUNTER — Other Ambulatory Visit: Payer: Self-pay

## 2019-02-09 DIAGNOSIS — O0990 Supervision of high risk pregnancy, unspecified, unspecified trimester: Secondary | ICD-10-CM

## 2019-02-09 DIAGNOSIS — Z809 Family history of malignant neoplasm, unspecified: Secondary | ICD-10-CM | POA: Insufficient documentation

## 2019-02-09 DIAGNOSIS — O99891 Other specified diseases and conditions complicating pregnancy: Secondary | ICD-10-CM | POA: Diagnosis not present

## 2019-02-09 DIAGNOSIS — O24112 Pre-existing diabetes mellitus, type 2, in pregnancy, second trimester: Secondary | ICD-10-CM | POA: Insufficient documentation

## 2019-02-09 DIAGNOSIS — O162 Unspecified maternal hypertension, second trimester: Secondary | ICD-10-CM | POA: Insufficient documentation

## 2019-02-09 DIAGNOSIS — E669 Obesity, unspecified: Secondary | ICD-10-CM | POA: Diagnosis not present

## 2019-02-09 DIAGNOSIS — Z3689 Encounter for other specified antenatal screening: Secondary | ICD-10-CM | POA: Diagnosis present

## 2019-02-09 DIAGNOSIS — Z823 Family history of stroke: Secondary | ICD-10-CM | POA: Insufficient documentation

## 2019-02-09 DIAGNOSIS — Z886 Allergy status to analgesic agent status: Secondary | ICD-10-CM | POA: Diagnosis not present

## 2019-02-09 DIAGNOSIS — Z885 Allergy status to narcotic agent status: Secondary | ICD-10-CM | POA: Diagnosis not present

## 2019-02-09 DIAGNOSIS — M549 Dorsalgia, unspecified: Secondary | ICD-10-CM | POA: Insufficient documentation

## 2019-02-09 DIAGNOSIS — M419 Scoliosis, unspecified: Secondary | ICD-10-CM | POA: Insufficient documentation

## 2019-02-09 DIAGNOSIS — E119 Type 2 diabetes mellitus without complications: Secondary | ICD-10-CM | POA: Diagnosis not present

## 2019-02-09 DIAGNOSIS — O99212 Obesity complicating pregnancy, second trimester: Secondary | ICD-10-CM | POA: Diagnosis not present

## 2019-02-09 DIAGNOSIS — Z7722 Contact with and (suspected) exposure to environmental tobacco smoke (acute) (chronic): Secondary | ICD-10-CM | POA: Insufficient documentation

## 2019-02-09 DIAGNOSIS — Z833 Family history of diabetes mellitus: Secondary | ICD-10-CM | POA: Diagnosis not present

## 2019-02-09 DIAGNOSIS — Z8249 Family history of ischemic heart disease and other diseases of the circulatory system: Secondary | ICD-10-CM | POA: Insufficient documentation

## 2019-02-09 DIAGNOSIS — Z3A27 27 weeks gestation of pregnancy: Secondary | ICD-10-CM | POA: Insufficient documentation

## 2019-02-09 DIAGNOSIS — G44209 Tension-type headache, unspecified, not intractable: Secondary | ICD-10-CM | POA: Diagnosis not present

## 2019-02-09 LAB — CBC
HCT: 31.8 % — ABNORMAL LOW (ref 36.0–46.0)
Hemoglobin: 10.5 g/dL — ABNORMAL LOW (ref 12.0–15.0)
MCH: 26.7 pg (ref 26.0–34.0)
MCHC: 33 g/dL (ref 30.0–36.0)
MCV: 80.9 fL (ref 80.0–100.0)
Platelets: 246 10*3/uL (ref 150–400)
RBC: 3.93 MIL/uL (ref 3.87–5.11)
RDW: 14.6 % (ref 11.5–15.5)
WBC: 10.8 10*3/uL — ABNORMAL HIGH (ref 4.0–10.5)
nRBC: 0 % (ref 0.0–0.2)

## 2019-02-09 LAB — URINALYSIS, ROUTINE W REFLEX MICROSCOPIC
Bilirubin Urine: NEGATIVE
Glucose, UA: NEGATIVE mg/dL
Hgb urine dipstick: NEGATIVE
Ketones, ur: NEGATIVE mg/dL
Leukocytes,Ua: NEGATIVE
Nitrite: NEGATIVE
Protein, ur: NEGATIVE mg/dL
Specific Gravity, Urine: 1.012 (ref 1.005–1.030)
pH: 7 (ref 5.0–8.0)

## 2019-02-09 LAB — COMPREHENSIVE METABOLIC PANEL
ALT: 11 U/L (ref 0–44)
AST: 14 U/L — ABNORMAL LOW (ref 15–41)
Albumin: 2.5 g/dL — ABNORMAL LOW (ref 3.5–5.0)
Alkaline Phosphatase: 98 U/L (ref 38–126)
Anion gap: 9 (ref 5–15)
BUN: 6 mg/dL (ref 6–20)
CO2: 23 mmol/L (ref 22–32)
Calcium: 8.6 mg/dL — ABNORMAL LOW (ref 8.9–10.3)
Chloride: 105 mmol/L (ref 98–111)
Creatinine, Ser: 0.69 mg/dL (ref 0.44–1.00)
GFR calc Af Amer: >60 mL/min (ref >60)
GFR calc non Af Amer: >60 mL/min (ref >60)
Glucose, Bld: 97 mg/dL (ref 70–99)
Potassium: 3.5 mmol/L (ref 3.5–5.1)
Sodium: 137 mmol/L (ref 135–145)
Total Bilirubin: 0.4 mg/dL (ref 0.3–1.2)
Total Protein: 6.1 g/dL — ABNORMAL LOW (ref 6.5–8.1)

## 2019-02-09 LAB — PROTEIN / CREATININE RATIO, URINE
Creatinine, Urine: 99.8 mg/dL
Protein Creatinine Ratio: 0.07 mg/mg{Cre} (ref 0.00–0.15)
Total Protein, Urine: 7 mg/dL

## 2019-02-09 MED ORDER — CYCLOBENZAPRINE HCL 10 MG PO TABS
10.0000 mg | ORAL_TABLET | Freq: Three times a day (TID) | ORAL | Status: DC | PRN
  Administered 2019-02-09: 10 mg via ORAL
  Filled 2019-02-09: qty 1

## 2019-02-09 MED ORDER — BLOOD PRESSURE MONITOR AUTOMAT DEVI: 1.0000 | Freq: Every day | 0 refills | Status: DC

## 2019-02-09 MED ORDER — CYCLOBENZAPRINE HCL 10 MG PO TABS: 10.0000 mg | ORAL_TABLET | Freq: Three times a day (TID) | ORAL | 0 refills | Status: DC | PRN

## 2019-02-09 MED ORDER — BUTALBITAL-APAP-CAFFEINE 50-325-40 MG PO TABS
1.0000 | ORAL_TABLET | Freq: Four times a day (QID) | ORAL | Status: DC | PRN
  Administered 2019-02-09: 1 via ORAL
  Filled 2019-02-09: qty 1

## 2019-02-09 NOTE — MAU Note (Signed)
Patient reports to MAU c/o back pain and a HA that started tonight. Pt reports taking 1000 mg of tylenol with no relief. Patient denies visual disturbance or RUQ pain. +FM. No bleeding or LOF.

## 2019-02-09 NOTE — MAU Provider Note (Signed)
History     CSN: [id number]  Arrival date and time: 02/09/19 0865   First Provider Initiated Contact with Patient 02/09/19 0048      Chief Complaint  Patient presents with  . Back Pain  . Headache   23 y.o. G2P1001 '@27' .5 wks presenting with LBP and HA. Sx started yesterday. LBP is constant. Rates 10/10. Took Tylenol but didn't help. Denies pushing, pulling, lifting, and injury. Denies urinary sx. Denies VB, LOF, and ctx. +FM.  HA is frontal. Rates 10/10. Took Tylenol w/o relief. Denies visual disturbances, SOB, RUQ pain, and CP. Her pregnancy is complicated by [facility], obesity, short pregnancy interval, and GERD.  OB History    Gravida  2   Para  1   Term  1   Preterm      AB      Living  1     SAB      TAB      Ectopic      Multiple  0   Live Births  1           Past Medical History:  Diagnosis Date  . Acanthosis nigricans, acquired   . Constipation   . Diabetes mellitus type II    Young age around 49 per pt  . GERD (gastroesophageal reflux disease)   . Goiter   . Hypertension   . Migraines   . Obesity   . Osteochondroma of lower leg   . Pollen allergies   . Pregnancy induced hypertension   . Scoliosis     Past Surgical History:  Procedure Laterality Date  . LOWER LEG SOFT TISSUE TUMOR EXCISION  2011  . OSTEOCHONDROMA EXCISION      Family History  Problem Relation Age of Onset  . Diabetes Mother   . Ulcers Mother   . Thyroid disease Mother   . Cancer Mother   . Obesity Mother   . Heart disease Mother   . Heart attack Mother        caused death   . Diabetes Maternal Grandmother   . Stroke Maternal Grandmother   . Ulcers Maternal Grandmother   . Diabetes Maternal Grandfather   . GER disease Maternal Grandfather   . Heart disease Maternal Grandfather   . GER disease Sister   . Diabetes Father   . GER disease Father   . Obesity Sister   . Diabetes Paternal Grandmother   . Thyroid disease Paternal Grandmother   . Diabetes Paternal  Grandfather   . Heart disease Maternal Aunt   . Diabetes Maternal Aunt   . Thyroid disease Maternal Aunt   . Cancer Maternal Aunt   . Obesity Maternal Aunt   . GER disease Maternal Aunt     Social History   Tobacco Use  . Smoking status: Passive Smoke Exposure - Never Smoker  . Smokeless tobacco: Never Used  Substance Use Topics  . Alcohol use: No  . Drug use: No    Allergies:  Allergies  Allergen Reactions  . Morphine And Related Other (See Comments)    Hot flashes, can't breathe  . Tramadol Other (See Comments)    Hot flashes     Medications Prior to Admission  Medication Sig Dispense Refill Last Dose  . acetaminophen (TYLENOL) 500 MG tablet Take 1 tablet (500 mg total) by mouth every 6 (six) hours as needed. 30 tablet 0 02/09/2019 at Unknown time  . aspirin 81 MG chewable tablet Chew 1 tablet (81 mg total) by mouth  daily. 30 tablet 2 02/08/2019 at Unknown time  . Elastic Bandages & Supports (COMFORT FIT MATERNITY SUPP LG) MISC 1 Units by Does not apply route daily. 1 each 0 02/08/2019 at Unknown time  . Prenatal Vit-Fe Fumarate-FA (PRENATAL COMPLETE) 14-0.4 MG TABS Take 1 tablet by mouth daily. 30 tablet 12 02/08/2019 at Unknown time  . sertraline (ZOLOFT) 50 MG tablet Take 1 tablet (50 mg total) by mouth daily. 30 tablet 6 02/08/2019 at Unknown time  . [DISCONTINUED] cyclobenzaprine (FLEXERIL) 10 MG tablet Take 1 tablet (10 mg total) by mouth 3 (three) times daily as needed (back pain). 30 tablet 0 02/08/2019 at Unknown time  . Blood Glucose Monitoring Suppl KIT Check blood pressure 1-2 times per week 1 kit 0 Unknown at Unknown time    Review of Systems  Constitutional: Negative for chills and fever.  Eyes: Negative for visual disturbance.  Respiratory: Negative for shortness of breath.   Cardiovascular: Negative for chest pain.  Gastrointestinal: Negative for abdominal pain.  Musculoskeletal: Positive for back pain.  Neurological: Positive for headaches.    Physical Exam   Blood pressure (!) 110/56, pulse 94, temperature 98.3 F (36.8 C), temperature source Oral, resp. rate 16, weight 107.4 kg, last menstrual period 07/30/2018, unknown if currently breastfeeding. Patient Vitals for the past 24 hrs:  BP Temp Temp src Pulse Resp Weight  02/09/19 0045 (!) 110/56 - - 94 - -  02/09/19 0025 126/74 98.3 F (36.8 C) Oral (!) 102 16 107.4 kg    Physical Exam  Nursing note and vitals reviewed. Constitutional: She is oriented to person, place, and time. She appears well-developed and well-nourished. No distress.  HENT:  Head: Normocephalic and atraumatic.  Neck: Normal range of motion.  Cardiovascular: Normal rate.  Respiratory: Effort normal. No respiratory distress.  GI: Soft. She exhibits no distension. There is no abdominal tenderness. There is no CVA tenderness.  gravid  Genitourinary:    Genitourinary Comments: SVE: closed/thick   Musculoskeletal: Normal range of motion.     Cervical back: Normal.     Thoracic back: Normal.     Lumbar back: She exhibits tenderness (bilat).  Neurological: She is alert and oriented to person, place, and time.  Skin: Skin is warm and dry.  Psychiatric: She has a normal mood and affect.  EFM: 145 bpm, mod variability, + accels, no decels Toco: none  Results for orders placed or performed during the hospital encounter of 02/09/19 (from the past 24 hour(s))  Urinalysis, Routine w reflex microscopic     Status: Abnormal   Collection Time: 02/09/19 12:33 AM  Result Value Ref Range   Color, Urine YELLOW YELLOW   APPearance HAZY (A) CLEAR   Specific Gravity, Urine 1.012 1.005 - 1.030   pH 7.0 5.0 - 8.0   Glucose, UA NEGATIVE NEGATIVE mg/dL   Hgb urine dipstick NEGATIVE NEGATIVE   Bilirubin Urine NEGATIVE NEGATIVE   Ketones, ur NEGATIVE NEGATIVE mg/dL   Protein, ur NEGATIVE NEGATIVE mg/dL   Nitrite NEGATIVE NEGATIVE   Leukocytes,Ua NEGATIVE NEGATIVE  Protein / creatinine ratio, urine     Status: None    Collection Time: 02/09/19  1:00 AM  Result Value Ref Range   Creatinine, Urine 99.80 mg/dL   Total Protein, Urine 7 mg/dL   Protein Creatinine Ratio 0.07 0.00 - 0.15 mg/mg[Cre]  CBC     Status: Abnormal   Collection Time: 02/09/19  1:28 AM  Result Value Ref Range   WBC 10.8 (H) 4.0 - 10.5 K/uL  RBC 3.93 3.87 - 5.11 MIL/uL   Hemoglobin 10.5 (L) 12.0 - 15.0 g/dL   HCT 31.8 (L) 36.0 - 46.0 %   MCV 80.9 80.0 - 100.0 fL   MCH 26.7 26.0 - 34.0 pg   MCHC 33.0 30.0 - 36.0 g/dL   RDW 14.6 11.5 - 15.5 %   Platelets 246 150 - 400 K/uL   nRBC 0.0 0.0 - 0.2 %  Comprehensive metabolic panel     Status: Abnormal   Collection Time: 02/09/19  1:28 AM  Result Value Ref Range   Sodium 137 135 - 145 mmol/L   Potassium 3.5 3.5 - 5.1 mmol/L   Chloride 105 98 - 111 mmol/L   CO2 23 22 - 32 mmol/L   Glucose, Bld 97 70 - 99 mg/dL   BUN 6 6 - 20 mg/dL   Creatinine, Ser 0.69 0.44 - 1.00 mg/dL   Calcium 8.6 (L) 8.9 - 10.3 mg/dL   Total Protein 6.1 (L) 6.5 - 8.1 g/dL   Albumin 2.5 (L) 3.5 - 5.0 g/dL   AST 14 (L) 15 - 41 U/L   ALT 11 0 - 44 U/L   Alkaline Phosphatase 98 38 - 126 U/L   Total Bilirubin 0.4 0.3 - 1.2 mg/dL   GFR calc non Af Amer >60 >60 mL/min   GFR calc Af Amer >60 >60 mL/min   Anion gap 9 5 - 15   MAU Course  Procedures Meds ordered this encounter  Medications  . butalbital-acetaminophen-caffeine (FIORICET) 50-325-40 MG per tablet 1 tablet  . cyclobenzaprine (FLEXERIL) tablet 10 mg  . cyclobenzaprine (FLEXERIL) 10 MG tablet    Sig: Take 1 tablet (10 mg total) by mouth 3 (three) times daily as needed (back pain).    Dispense:  20 tablet    Refill:  0    Order Specific Question:   Supervising Provider    Answer:   Rip Harbour, MICHAEL L [[id number]]   MDM Labs ordered and reviewed. No evidence of PEC, UTI, pyelo, or PTL. HA and back pain resolved after meds. Stable for discharge home.  Assessment and Plan   1. [redacted] weeks gestation of pregnancy   2. NST (non-stress test) reactive   3.  Acute non intractable tension-type headache   4. Back pain affecting pregnancy in second trimester    Discharge home Follow up at [facility] as scheduled Rx Flexeril PTL precautions PEC precautions  Allergies as of 02/09/2019      Reactions   Morphine And Related Other (See Comments)   Hot flashes, can't breathe   Tramadol Other (See Comments)   Hot flashes       Medication List    TAKE these medications   acetaminophen 500 MG tablet Commonly known as: TYLENOL Take 1 tablet (500 mg total) by mouth every 6 (six) hours as needed.   aspirin 81 MG chewable tablet Chew 1 tablet (81 mg total) by mouth daily.   Blood Glucose Monitoring Suppl Kit Check blood pressure 1-2 times per week   Comfort Fit Maternity Supp Lg Misc 1 Units by Does not apply route daily.   cyclobenzaprine 10 MG tablet Commonly known as: FLEXERIL Take 1 tablet (10 mg total) by mouth 3 (three) times daily as needed (back pain).   Prenatal Complete 14-0.4 MG Tabs Take 1 tablet by mouth daily.   sertraline 50 MG tablet Commonly known as: Zoloft Take 1 tablet (50 mg total) by mouth daily.       Julianne Handler, CNM 02/09/2019,  3:19 AM

## 2019-02-09 NOTE — Discharge Instructions (Signed)
Tension Headache, Adult A tension headache is pain, pressure, or aching in your head. Tension headaches can last from 30 minutes to several days. Follow these instructions at home: Managing pain  Take over-the-counter and prescription medicines only as told by your doctor.  When you have a headache, lie down in a dark, quiet room.  If told, put ice on your head and neck: ? Put ice in a plastic bag. ? Place a towel between your skin and the bag. ? Leave the ice on for 20 minutes, 2-3 times a day.  If told, put heat on the back of your neck. Do this as often as your doctor tells you to. Use the kind of heat that your doctor recommends, such as a moist heat pack or a heating pad. ? Place a towel between your skin and the heat. ? Leave the heat on for 20-30 minutes. ? Remove the heat if your skin turns bright red. Eating and drinking  Eat meals on a regular schedule.  Watch how much alcohol you drink: ? If you are a woman and are not pregnant, do not drink more than 1 drink a day. ? If you are a man, do not drink more than 2 drinks a day.  Drink enough fluid to keep your pee (urine) pale yellow.  Do not use a lot of caffeine, or stop using caffeine. Lifestyle  Get enough sleep. Get 7-9 hours of sleep each night. Or get the amount of sleep that your doctor tells you to.  At bedtime, remove all electronic devices from your room. Examples of electronic devices are computers, phones, and tablets.  Find ways to lessen your stress. Some things that can lessen stress are: ? Exercise. ? Deep breathing. ? Yoga. ? Music. ? Positive thoughts.  Sit up straight. Do not tighten (tense) your muscles.  Do not use any products that have nicotine or tobacco in them, such as cigarettes and e-cigarettes. If you need help quitting, ask your doctor. General instructions   Keep all follow-up visits as told by your doctor. This is important.  Avoid things that can bring on headaches. Keep a  journal to find out if certain things bring on headaches. For example, write down: ? What you eat and drink. ? How much sleep you get. ? Any change to your diet or medicines. Contact a doctor if:  Your headache does not get better.  Your headache comes back.  You have a headache and sounds, light, or smells bother you.  You feel sick to your stomach (nauseous) or you throw up (vomit).  Your stomach hurts. Get help right away if:  You suddenly get a very bad headache along with any of these: ? A stiff neck. ? Feeling sick to your stomach. ? Throwing up. ? Feeling weak. ? Trouble seeing. ? Feeling short of breath. ? A rash. ? Feeling unusually sleepy. ? Trouble speaking. ? Pain in your eye or ear. ? Trouble walking or balancing. ? Feeling like you will pass out (faint). ? Passing out. Summary  A tension headache is pain, pressure, or aching in your head.  Tension headaches can last from 30 minutes to several days.  Lifestyle changes and medicines may help relieve pain. This information is not intended to replace advice given to you by your health care provider. Make sure you discuss any questions you have with your health care provider. Document Released: 05/23/2009 Document Revised: 02/08/2017 Document Reviewed: 06/08/2016 Elsevier Patient Education  2020 Elsevier   Inc.   Back Pain in Pregnancy Back pain during pregnancy is common. Back pain may be caused by several factors that are related to changes during your pregnancy. Follow these instructions at home: Managing pain, stiffness, and swelling      If directed, for sudden (acute) back pain, put ice on the painful area. ? Put ice in a plastic bag. ? Place a towel between your skin and the bag. ? Leave the ice on for 20 minutes, 2-3 times per day.  If directed, apply heat to the affected area before you exercise. Use the heat source that your health care provider recommends, such as a moist heat pack or a  heating pad. ? Place a towel between your skin and the heat source. ? Leave the heat on for 20-30 minutes. ? Remove the heat if your skin turns bright red. This is especially important if you are unable to feel pain, heat, or cold. You may have a greater risk of getting burned.  If directed, massage the affected area. Activity  Exercise as told by your health care provider. Gentle exercise is the best way to prevent or manage back pain.  Listen to your body when lifting. If lifting hurts, ask for help or bend your knees. This uses your leg muscles instead of your back muscles.  Squat down when picking up something from the floor. Do not bend over.  Only use bed rest for short periods as told by your health care provider. Bed rest should only be used for the most severe episodes of back pain. Standing, sitting, and lying down  Do not stand in one place for long periods of time.  Use good posture when sitting. Make sure your head rests over your shoulders and is not hanging forward. Use a pillow on your lower back if necessary.  Try sleeping on your side, preferably the left side, with a pregnancy support pillow or 1-2 regular pillows between your legs. ? If you have back pain after a night's rest, your bed may be too soft. ? A firm mattress may provide more support for your back during pregnancy. General instructions  Do not wear high heels.  Eat a healthy diet. Try to gain weight within your health care provider's recommendations.  Use a maternity girdle, elastic sling, or back brace as told by your health care provider.  Take over-the-counter and prescription medicines only as told by your health care provider.  Work with a physical therapist or massage therapist to find ways to manage back pain. Acupuncture or massage therapy may be helpful.  Keep all follow-up visits as told by your health care provider. This is important. Contact a health care provider if:  Your back pain  interferes with your daily activities.  You have increasing pain in other parts of your body. Get help right away if:  You develop numbness, tingling, weakness, or problems with the use of your arms or legs.  You develop severe back pain that is not controlled with medicine.  You have a change in bowel or bladder control.  You develop shortness of breath, dizziness, or you faint.  You develop nausea, vomiting, or sweating.  You have back pain that is a rhythmic, cramping pain similar to labor pains. Labor pain is usually 1-2 minutes apart, lasts for about 1 minute, and involves a bearing down feeling or pressure in your pelvis.  You have back pain and your water breaks or you have vaginal bleeding.  You have  back pain or numbness that travels down your leg.  Your back pain developed after you fell.  You develop pain on one side of your back.  You see blood in your urine.  You develop skin blisters in the area of your back pain. Summary  Back pain may be caused by several factors that are related to changes during your pregnancy.  Follow instructions as told by your health care provider for managing pain, stiffness, and swelling.  Exercise as told by your health care provider. Gentle exercise is the best way to prevent or manage back pain.  Take over-the-counter and prescription medicines only as told by your health care provider.  Keep all follow-up visits as told by your health care provider. This is important. This information is not intended to replace advice given to you by your health care provider. Make sure you discuss any questions you have with your health care provider. Document Released: 06/06/2005 Document Revised: 06/17/2018 Document Reviewed: 08/14/2017 Elsevier Patient Education  2020 [employer].

## 2019-02-09 NOTE — Progress Notes (Signed)
   Patient in clinic for 28 week labs only.  Derl Barrow, RN

## 2019-02-10 LAB — GLUCOSE TOLERANCE, 2 HOURS W/ 1HR
Glucose, 1 hour: 165 mg/dL (ref 65–179)
Glucose, 2 hour: 130 mg/dL (ref 65–152)
Glucose, Fasting: 82 mg/dL (ref 65–91)

## 2019-02-10 LAB — HIV ANTIBODY (ROUTINE TESTING W REFLEX): HIV Screen 4th Generation wRfx: NONREACTIVE

## 2019-02-10 LAB — RPR: RPR Ser Ql: NONREACTIVE

## 2019-02-18 ENCOUNTER — Telehealth: Payer: Medicaid Other | Admitting: Certified Nurse Midwife

## 2019-02-18 ENCOUNTER — Telehealth: Payer: Self-pay | Admitting: *Deleted

## 2019-02-18 NOTE — Telephone Encounter (Signed)
Patient was called to get checked in for Mychart visit today. However, patient requested to be rescheduled. Patient spoke with nurse regarding glucose results. Glucose results were negative. Patient advised she does not have GDM with this pregnancy. Pt also advised to keep a record of blood pressure and make sure she is taking her ASA on a daily bases. Pt voiced understanding.  Derl Barrow, RN

## 2019-02-19 ENCOUNTER — Other Ambulatory Visit: Payer: Self-pay

## 2019-02-19 ENCOUNTER — Ambulatory Visit (HOSPITAL_COMMUNITY)
Admission: RE | Admit: 2019-02-19 | Discharge: 2019-02-19 | Disposition: A | Discharge status: Home / Self Care | Payer: Medicaid Other | Source: Ambulatory Visit | Attending: Obstetrics and Gynecology | Admitting: Obstetrics and Gynecology

## 2019-02-19 ENCOUNTER — Other Ambulatory Visit (HOSPITAL_COMMUNITY): Payer: Self-pay | Admitting: *Deleted

## 2019-02-19 ENCOUNTER — Ambulatory Visit (HOSPITAL_COMMUNITY): Payer: Medicaid Other | Admitting: *Deleted

## 2019-02-19 ENCOUNTER — Encounter (HOSPITAL_COMMUNITY): Payer: Self-pay

## 2019-02-19 DIAGNOSIS — O0990 Supervision of high risk pregnancy, unspecified, unspecified trimester: Secondary | ICD-10-CM | POA: Insufficient documentation

## 2019-02-19 DIAGNOSIS — O99213 Obesity complicating pregnancy, third trimester: Secondary | ICD-10-CM | POA: Diagnosis not present

## 2019-02-19 DIAGNOSIS — Z362 Encounter for other antenatal screening follow-up: Secondary | ICD-10-CM | POA: Diagnosis not present

## 2019-02-19 DIAGNOSIS — Z3A29 29 weeks gestation of pregnancy: Secondary | ICD-10-CM

## 2019-02-19 DIAGNOSIS — O09293 Supervision of pregnancy with other poor reproductive or obstetric history, third trimester: Secondary | ICD-10-CM | POA: Diagnosis not present

## 2019-02-25 ENCOUNTER — Telehealth (INDEPENDENT_AMBULATORY_CARE_PROVIDER_SITE_OTHER): Payer: Medicaid Other | Admitting: Advanced Practice Midwife

## 2019-02-25 ENCOUNTER — Encounter: Payer: Self-pay | Admitting: Advanced Practice Midwife

## 2019-02-25 ENCOUNTER — Other Ambulatory Visit: Payer: Self-pay

## 2019-02-25 DIAGNOSIS — O0990 Supervision of high risk pregnancy, unspecified, unspecified trimester: Secondary | ICD-10-CM

## 2019-02-25 DIAGNOSIS — O0992 Supervision of high risk pregnancy, unspecified, second trimester: Secondary | ICD-10-CM

## 2019-02-25 DIAGNOSIS — Z3A25 25 weeks gestation of pregnancy: Secondary | ICD-10-CM

## 2019-02-25 MED ORDER — CYCLOBENZAPRINE HCL 10 MG PO TABS: 10.0000 mg | ORAL_TABLET | Freq: Three times a day (TID) | ORAL | 1 refills | Status: DC | PRN

## 2019-02-25 NOTE — Progress Notes (Signed)
   TELEHEALTH VIRTUAL OBSTETRICS VISIT ENCOUNTER NOTE  I connected with Leslie Skinner on 02/25/19 at 11:10 AM EST by telephone at home and verified that I am speaking with the correct person using two identifiers.   I discussed the limitations, risks, security and privacy concerns of performing an evaluation and management service by telephone and the availability of in person appointments. I also discussed with the patient that there may be a patient responsible charge related to this service. The patient expressed understanding and agreed to proceed.  Subjective:  Leslie Skinner is a 23 y.o. G2P1001 at [redacted]w[redacted]d being followed for ongoing prenatal care.  She is currently monitored for the following issues for this low-risk pregnancy and has Chronic hypertension during pregnancy, antepartum; GERD (gastroesophageal reflux disease); Migraine; Scoliosis; Obesity, Class III, BMI 40-49.9 (morbid obesity) ([place]); Anxiety state, unspecified; Pre-diabetes; Pyelonephritis affecting pregnancy; Current mild episode of major depressive disorder ([place]); Chronic bilateral thoracic back pain; and Supervision of high risk pregnancy, antepartum on their problem list.  Patient reports no complaints. Reports fetal movement. Denies any contractions, bleeding or leaking of fluid.   The following portions of the patient's history were reviewed and updated as appropriate: allergies, current medications, past family history, past medical history, past social history, past surgical history and problem list.   Objective:   General:  Alert, oriented and cooperative.   Mental Status: Normal mood and affect perceived. Normal judgment and thought content.  Rest of physical exam deferred due to type of encounter  Assessment and Plan:  Pregnancy: G2P1001 at [redacted]w[redacted]d 1. Supervision of high risk pregnancy, antepartum Routine care - Patient is worried about eating ice. Discussed PICA and related concerns. She is only eating eat  at this time. However, she has noticed that certain scents help calm her. She finds that the smell of gorilla tape is soothing. So, she will put some tape under her nose.   Preterm labor symptoms and general obstetric precautions including but not limited to vaginal bleeding, contractions, leaking of fluid and fetal movement were reviewed in detail with the patient.  I discussed the assessment and treatment plan with the patient. The patient was provided an opportunity to ask questions and all were answered. The patient agreed with the plan and demonstrated an understanding of the instructions. The patient was advised to call back or seek an in-person office evaluation/go to MAU at [facility] for any urgent or concerning symptoms. Please refer to After Visit Summary for other counseling recommendations.   I provided 18 minutes of non-face-to-face time during this encounter.  Return in about 2 weeks (around 03/11/2019) for virtual visit .  Future Appointments  Date Time Provider [place]  03/23/2019  7:45 AM WH-MFC [place] 2 WH-MFCUS MFC-US  03/23/2019  7:50 AM WH-MFC NURSE WH-MFC MFC-US    Marcille Buffy DNP, CNM  02/25/19  11:13 AM  [facility] for [facility]

## 2019-03-11 ENCOUNTER — Telehealth (INDEPENDENT_AMBULATORY_CARE_PROVIDER_SITE_OTHER): Payer: Medicaid Other | Admitting: Advanced Practice Midwife

## 2019-03-11 ENCOUNTER — Encounter: Payer: Self-pay | Admitting: Advanced Practice Midwife

## 2019-03-11 ENCOUNTER — Other Ambulatory Visit: Payer: Self-pay

## 2019-03-11 VITALS — BP 124/72 | HR 98

## 2019-03-11 DIAGNOSIS — Z3A32 32 weeks gestation of pregnancy: Secondary | ICD-10-CM

## 2019-03-11 DIAGNOSIS — O0993 Supervision of high risk pregnancy, unspecified, third trimester: Secondary | ICD-10-CM

## 2019-03-11 DIAGNOSIS — O99891 Other specified diseases and conditions complicating pregnancy: Secondary | ICD-10-CM | POA: Diagnosis not present

## 2019-03-11 DIAGNOSIS — O0990 Supervision of high risk pregnancy, unspecified, unspecified trimester: Secondary | ICD-10-CM

## 2019-03-11 DIAGNOSIS — M549 Dorsalgia, unspecified: Secondary | ICD-10-CM

## 2019-03-11 MED ORDER — LOPERAMIDE HCL 2 MG PO TABS: 2.0000 mg | ORAL_TABLET | Freq: Four times a day (QID) | ORAL | 0 refills | Status: DC | PRN

## 2019-03-11 MED ORDER — FAMOTIDINE 40 MG PO TABS: 40.0000 mg | ORAL_TABLET | Freq: Every day | ORAL | 3 refills | Status: DC

## 2019-03-11 MED ORDER — FERROUS SULFATE 325 (65 FE) MG PO TABS: 325.0000 mg | ORAL_TABLET | ORAL | 3 refills | Status: DC

## 2019-03-11 NOTE — Progress Notes (Signed)
   TELEHEALTH VIRTUAL OBSTETRICS VISIT ENCOUNTER NOTE  I connected with WESTLYNN VOWELS on 03/11/19 at 11:10 AM EST by telephone at home and verified that I am speaking with the correct person using two identifiers.   I discussed the limitations, risks, security and privacy concerns of performing an evaluation and management service by telephone and the availability of in person appointments. I also discussed with the patient that there may be a patient responsible charge related to this service. The patient expressed understanding and agreed to proceed.  Subjective:  Leslie Skinner is a 23 y.o. G2P1001 at [redacted]w[redacted]d being followed for ongoing prenatal care.  She is currently monitored for the following issues for this high-risk pregnancy and has Chronic hypertension during pregnancy, antepartum; GERD (gastroesophageal reflux disease); Migraine; Scoliosis; Obesity, Class III, BMI 40-49.9 (morbid obesity) ([place]); Anxiety state, unspecified; Pre-diabetes; Pyelonephritis affecting pregnancy; Current mild episode of major depressive disorder ([place]); Chronic bilateral thoracic back pain; and Supervision of high risk pregnancy, antepartum on their problem list.  Patient reports diarrhea, acid reflux x 2 days, back pain . Reports fetal movement. Denies any contractions, bleeding or leaking of fluid.   The following portions of the patient's history were reviewed and updated as appropriate: allergies, current medications, past family history, past medical history, past social history, past surgical history and problem list.   Objective:   General:  Alert, oriented and cooperative.   Mental Status: Normal mood and affect perceived. Normal judgment and thought content.  Rest of physical exam deferred due to type of encounter  Assessment and Plan:  Pregnancy: G2P1001 at [redacted]w[redacted]d 1. Supervision of high risk pregnancy, antepartum - routine care - BRAT diet  - Offered PT referral for back pain. Patient has  tried flexeril, maternity support belt, heat/ice without improvement.   Preterm labor symptoms and general obstetric precautions including but not limited to vaginal bleeding, contractions, leaking of fluid and fetal movement were reviewed in detail with the patient.  I discussed the assessment and treatment plan with the patient. The patient was provided an opportunity to ask questions and all were answered. The patient agreed with the plan and demonstrated an understanding of the instructions. The patient was advised to call back or seek an in-person office evaluation/go to [facility]U at [facility] for any urgent or concerning symptoms. Please refer to After Visit Summary for other counseling recommendations.   I provided 15 minutes of non-face-to-face time during this encounter.  Return in about 2 weeks (around 03/25/2019) for virtual visit .  Future Appointments  Date Time Provider [place]  03/23/2019  7:45 AM WH-MFC [place] 2 WH-MFCUS MFC-US  03/23/2019  7:50 AM WH-MFC NURSE WH-MFC MFC-US  04/10/2019 11:10 AM Gavin Pound, CNM [facility] None    Marcille Buffy DNP, CNM  03/11/19  11:48 AM  [facility] for [facility]

## 2019-03-11 NOTE — Addendum Note (Signed)
Addended by: Marcille Buffy D on: 03/11/2019 01:30 PM   Modules accepted: Orders

## 2019-03-13 NOTE — L&D Delivery Note (Signed)
Delivery Note At 66 a viable female infant was delivered via SVD, presentation: ROT. APGAR: 8, 5, 9; weight 7'1.   Placenta status: spontaneously delivered intact with gentle cord traction. Fundus firm with massage and Pitocin.   Anesthesia: epidural Lacerations: periurethral-hemostatic Est. Blood Loss (mL): 150 Placenta to LD Complications nuchal x1 tight, delivered through Cord ph n/a   Mom to postpartum. Baby to Couplet care / Skin to Skin.    Julianne Handler, CNM 05/03/2019 2:26 PM

## 2019-03-23 ENCOUNTER — Ambulatory Visit (HOSPITAL_COMMUNITY): Payer: Medicaid Other | Admitting: *Deleted

## 2019-03-23 ENCOUNTER — Ambulatory Visit (HOSPITAL_COMMUNITY)
Admission: RE | Admit: 2019-03-23 | Discharge: 2019-03-23 | Disposition: A | Discharge status: Home / Self Care | Payer: Medicaid Other | Source: Ambulatory Visit | Attending: Obstetrics and Gynecology | Admitting: Obstetrics and Gynecology

## 2019-03-23 ENCOUNTER — Other Ambulatory Visit: Payer: Self-pay

## 2019-03-23 ENCOUNTER — Encounter (HOSPITAL_COMMUNITY): Payer: Self-pay

## 2019-03-23 ENCOUNTER — Other Ambulatory Visit (HOSPITAL_COMMUNITY): Payer: Self-pay | Admitting: *Deleted

## 2019-03-23 DIAGNOSIS — O09293 Supervision of pregnancy with other poor reproductive or obstetric history, third trimester: Secondary | ICD-10-CM

## 2019-03-23 DIAGNOSIS — O0990 Supervision of high risk pregnancy, unspecified, unspecified trimester: Secondary | ICD-10-CM

## 2019-03-23 DIAGNOSIS — O99213 Obesity complicating pregnancy, third trimester: Secondary | ICD-10-CM

## 2019-03-23 DIAGNOSIS — Z3A33 33 weeks gestation of pregnancy: Secondary | ICD-10-CM

## 2019-03-23 DIAGNOSIS — O10013 Pre-existing essential hypertension complicating pregnancy, third trimester: Secondary | ICD-10-CM

## 2019-03-26 ENCOUNTER — Ambulatory Visit: Payer: Medicaid Other | Attending: Advanced Practice Midwife | Admitting: Physical Therapy

## 2019-03-27 ENCOUNTER — Telehealth (INDEPENDENT_AMBULATORY_CARE_PROVIDER_SITE_OTHER): Payer: Medicaid Other | Admitting: Advanced Practice Midwife

## 2019-03-27 ENCOUNTER — Encounter: Payer: Self-pay | Admitting: Advanced Practice Midwife

## 2019-03-27 ENCOUNTER — Other Ambulatory Visit: Payer: Self-pay

## 2019-03-27 DIAGNOSIS — O0990 Supervision of high risk pregnancy, unspecified, unspecified trimester: Secondary | ICD-10-CM

## 2019-03-27 DIAGNOSIS — Z3A34 34 weeks gestation of pregnancy: Secondary | ICD-10-CM

## 2019-03-27 DIAGNOSIS — O0993 Supervision of high risk pregnancy, unspecified, third trimester: Secondary | ICD-10-CM

## 2019-03-27 NOTE — Progress Notes (Signed)
   TELEHEALTH VIRTUAL OBSTETRICS VISIT ENCOUNTER NOTE  I connected with Leslie Skinner on 03/27/19 at 10:30 AM EST by mychart video at home and verified that I am speaking with the correct person using two identifiers.   I discussed the limitations, risks, security and privacy concerns of performing an evaluation and management service by telephone and the availability of in person appointments. I also discussed with the patient that there may be a patient responsible charge related to this service. The patient expressed understanding and agreed to proceed.  Subjective:  Leslie Skinner is a 24 y.o. G2P1001 at [redacted]w[redacted]d being followed for ongoing prenatal care.  She is currently monitored for the following issues for this high-risk pregnancy and has Chronic hypertension during pregnancy, antepartum; GERD (gastroesophageal reflux disease); Migraine; Scoliosis; Obesity, Class III, BMI 40-49.9 (morbid obesity) ([place]); Anxiety state, unspecified; Pre-diabetes; Pyelonephritis affecting pregnancy; Current mild episode of major depressive disorder ([place]); Chronic bilateral thoracic back pain; and Supervision of high risk pregnancy, antepartum on their problem list.  Patient reports no complaints. Reports fetal movement. Denies any contractions, bleeding or leaking of fluid.   The following portions of the patient's history were reviewed and updated as appropriate: allergies, current medications, past family history, past medical history, past social history, past surgical history and problem list.   Objective:   General:  Alert, oriented and cooperative.   Mental Status: Normal mood and affect perceived. Normal judgment and thought content.  Rest of physical exam deferred due to type of encounter  BP today with home cuff  127/74   Assessment and Plan:  Pregnancy: G2P1001 at [redacted]w[redacted]d 1. Supervision of high risk pregnancy, antepartum - routine care - heartburn is improving  Preterm labor symptoms and  general obstetric precautions including but not limited to vaginal bleeding, contractions, leaking of fluid and fetal movement were reviewed in detail with the patient.  I discussed the assessment and treatment plan with the patient. The patient was provided an opportunity to ask questions and all were answered. The patient agreed with the plan and demonstrated an understanding of the instructions. The patient was advised to call back or seek an in-person office evaluation/go to [facility]U at [facility] for any urgent or concerning symptoms. Please refer to After Visit Summary for other counseling recommendations.   I provided 12 minutes of non-face-to-face time during this encounter.  Return in about 2 weeks (around 04/10/2019) for in person visit with GBS and 36 week labs .  Future Appointments  Date Time Provider [place]  04/10/2019 11:10 AM Gavin Pound, CNM [facility] None  04/21/2019  7:40 AM [place] NURSE [place] MFC-US  04/21/2019  7:45 AM [place] 2 WH-MFCUS MFC-US    Marcille Buffy DNP, CNM  03/27/19  10:57 AM  [facility]

## 2019-04-10 ENCOUNTER — Other Ambulatory Visit (HOSPITAL_COMMUNITY)
Admission: RE | Admit: 2019-04-10 | Discharge: 2019-04-10 | Disposition: A | Discharge status: Home / Self Care | Payer: Medicaid Other | Source: Ambulatory Visit

## 2019-04-10 ENCOUNTER — Other Ambulatory Visit: Payer: Self-pay

## 2019-04-10 ENCOUNTER — Ambulatory Visit (INDEPENDENT_AMBULATORY_CARE_PROVIDER_SITE_OTHER): Payer: Medicaid Other

## 2019-04-10 VITALS — BP 133/80 | HR 105 | Temp 97.8°F | Wt 247.0 lb

## 2019-04-10 DIAGNOSIS — O0990 Supervision of high risk pregnancy, unspecified, unspecified trimester: Secondary | ICD-10-CM

## 2019-04-10 DIAGNOSIS — O10919 Unspecified pre-existing hypertension complicating pregnancy, unspecified trimester: Secondary | ICD-10-CM

## 2019-04-10 DIAGNOSIS — Z3A36 36 weeks gestation of pregnancy: Secondary | ICD-10-CM

## 2019-04-10 DIAGNOSIS — O10913 Unspecified pre-existing hypertension complicating pregnancy, third trimester: Secondary | ICD-10-CM

## 2019-04-10 DIAGNOSIS — O0992 Supervision of high risk pregnancy, unspecified, second trimester: Secondary | ICD-10-CM

## 2019-04-10 NOTE — Patient Instructions (Signed)

## 2019-04-10 NOTE — Progress Notes (Signed)
   PRENATAL VISIT NOTE  Subjective:  Leslie Skinner is a 24 y.o. G2P1001 at [redacted]w[redacted]d who presents today for routine prenatal care.  She is currently being monitored for supervision of a high-risk pregnancy with problems as listed below.  Patient endorses fetal movement and abdominal cramping, but no contractions.  She denies vaginal concerns including discharge, bleeding, leaking, itching, and burning.   Patient Active Problem List   Diagnosis Date Noted  . Supervision of high risk pregnancy, antepartum 12/15/2018  . Chronic bilateral thoracic back pain 08/05/2018  . Current mild episode of major depressive disorder ([place]) 11/14/2017  . Pyelonephritis affecting pregnancy 10/07/2017  . Anxiety state, unspecified 06/12/2012  . Pre-diabetes 06/12/2012  . Obesity, Class III, BMI 40-49.9 (morbid obesity) ([place]) 08/23/2011  . Scoliosis 11/22/2010  . Migraine 11/08/2010  . GERD (gastroesophageal reflux disease)   . Chronic hypertension during pregnancy, antepartum 05/16/2010    The following portions of the patient's history were reviewed and updated as appropriate: allergies, current medications, past family history, past medical history, past social history, past surgical history and problem list. Problem list updated.  Objective:   Vitals:   04/10/19 1117  BP: 133/80  Pulse: (!) 105  Temp: 97.8 F (36.6 C)  Weight: 247 lb (112 kg)    Fetal Status: Fetal Heart Rate (bpm): 147   Movement: Present  Presentation: Undeterminable  General:  Alert, oriented and cooperative. Patient is in no acute distress.  Skin: Skin is warm and dry.   Cardiovascular: Regular rate and rhythm.  Respiratory: Normal respiratory effort.   Abdomen: Soft, gravid, appropriate for gestational age.  Pelvic: Cervical exam performed, but not tolerated. GBS collected  Extremities: Normal range of motion.  Edema: None  Mental Status: Normal mood and affect. Normal behavior. Normal judgment and thought content.    Assessment and Plan:  Pregnancy: G2P1001 at [redacted]w[redacted]d  1. Supervision of high risk pregnancy, antepartum -Labs as below. -Educated on GBS bacteria including what it is, why we test, and how and when we treat if needed. -Discussed releasing of results to mychart. -Discussed and reviewed postpartum planning including contraception, pediatricians, and infant feedings. *Patient desires to breastfeed and is considering PPIUD for contraception. -Discussed patient anxiety regarding upcoming delivery.  Reassured that it is okay if ROM or onset of contractions occur.  Further informed that most have adequate time to get to the hospital prior to delivery.  -Anticipatory guidance for upcoming visits.   - Culture, beta strep (group b only) - Cervicovaginal ancillary only( [place])   2. Chronic hypertension during pregnancy, antepartum -BP Stable   Term labor symptoms and general obstetric precautions including but not limited to vaginal bleeding, contractions, leaking of fluid and fetal movement were reviewed with the patient.  Please refer to After Visit Summary for other counseling recommendations.  Return in about 2 weeks (around 04/24/2019) for HR-ROB via virutal visit.  Future Appointments  Date Time Provider [place]  04/21/2019  7:40 AM [place] MFC-US  04/21/2019  7:45 AM [place] 2 WH-MFCUS MFC-US    Maryann Conners, CNM 04/10/2019, 11:50 AM

## 2019-04-13 LAB — CERVICOVAGINAL ANCILLARY ONLY
Bacterial Vaginitis (gardnerella): NEGATIVE
Candida Glabrata: NEGATIVE
Candida Vaginitis: NEGATIVE
Chlamydia: NEGATIVE
Comment: NEGATIVE
Comment: NEGATIVE
Comment: NEGATIVE
Comment: NEGATIVE
Comment: NEGATIVE
Comment: NORMAL
Neisseria Gonorrhea: NEGATIVE
Trichomonas: NEGATIVE

## 2019-04-14 LAB — CULTURE, BETA STREP (GROUP B ONLY): Strep Gp B Culture: NEGATIVE

## 2019-04-21 ENCOUNTER — Ambulatory Visit (HOSPITAL_COMMUNITY)
Admission: RE | Admit: 2019-04-21 | Discharge: 2019-04-21 | Disposition: A | Discharge status: Home / Self Care | Payer: Medicaid Other | Source: Ambulatory Visit | Attending: Obstetrics and Gynecology | Admitting: Obstetrics and Gynecology

## 2019-04-21 ENCOUNTER — Ambulatory Visit (HOSPITAL_COMMUNITY): Payer: Medicaid Other | Admitting: *Deleted

## 2019-04-21 ENCOUNTER — Other Ambulatory Visit: Payer: Self-pay

## 2019-04-21 ENCOUNTER — Encounter (HOSPITAL_COMMUNITY): Payer: Self-pay

## 2019-04-21 DIAGNOSIS — O0990 Supervision of high risk pregnancy, unspecified, unspecified trimester: Secondary | ICD-10-CM | POA: Insufficient documentation

## 2019-04-21 DIAGNOSIS — O4103X0 Oligohydramnios, third trimester, not applicable or unspecified: Secondary | ICD-10-CM | POA: Insufficient documentation

## 2019-04-21 DIAGNOSIS — O10013 Pre-existing essential hypertension complicating pregnancy, third trimester: Secondary | ICD-10-CM | POA: Diagnosis not present

## 2019-04-21 DIAGNOSIS — Z3A37 37 weeks gestation of pregnancy: Secondary | ICD-10-CM

## 2019-04-21 DIAGNOSIS — Z362 Encounter for other antenatal screening follow-up: Secondary | ICD-10-CM | POA: Diagnosis not present

## 2019-04-21 DIAGNOSIS — O09293 Supervision of pregnancy with other poor reproductive or obstetric history, third trimester: Secondary | ICD-10-CM

## 2019-04-21 DIAGNOSIS — O99213 Obesity complicating pregnancy, third trimester: Secondary | ICD-10-CM | POA: Diagnosis present

## 2019-04-21 NOTE — Procedures (Signed)
Leslie Skinner 09-28-1995 [redacted]w[redacted]d  Fetus A Non-Stress Test Interpretation for 04/21/19  Indication: Decreased Fetal Movement  Fetal Heart Rate A Mode: External Baseline Rate (A): 150 bpm Variability: Moderate Accelerations: 15 x 15 Decelerations: None  Uterine Activity Mode: Palpation, Toco Contraction Frequency (min): Occas. Contraction Duration (sec): 30-45 Resting Tone Palpated: Relaxed Resting Time: Adequate  Interpretation (Fetal Testing) Nonstress Test Interpretation: Reactive Comments: EFM tracing reviewed by Dr. Annamaria Boots

## 2019-04-22 ENCOUNTER — Telehealth: Payer: Self-pay | Admitting: General Practice

## 2019-04-22 ENCOUNTER — Telehealth: Payer: Medicaid Other | Admitting: Advanced Practice Midwife

## 2019-04-22 NOTE — Telephone Encounter (Signed)
Called patient to check in for Mychart video appt at 4:10pm, but no answer.  Asked patient to give our office a call.

## 2019-04-29 ENCOUNTER — Other Ambulatory Visit: Payer: Self-pay

## 2019-04-29 ENCOUNTER — Other Ambulatory Visit: Payer: Self-pay | Admitting: Advanced Practice Midwife

## 2019-04-29 ENCOUNTER — Ambulatory Visit (INDEPENDENT_AMBULATORY_CARE_PROVIDER_SITE_OTHER): Payer: Medicaid Other | Admitting: Certified Nurse Midwife

## 2019-04-29 ENCOUNTER — Encounter: Payer: Self-pay | Admitting: Certified Nurse Midwife

## 2019-04-29 VITALS — BP 140/79 | HR 90 | Temp 98.1°F | Wt 252.4 lb

## 2019-04-29 DIAGNOSIS — O10919 Unspecified pre-existing hypertension complicating pregnancy, unspecified trimester: Secondary | ICD-10-CM

## 2019-04-29 DIAGNOSIS — Z3A39 39 weeks gestation of pregnancy: Secondary | ICD-10-CM

## 2019-04-29 DIAGNOSIS — O0990 Supervision of high risk pregnancy, unspecified, unspecified trimester: Secondary | ICD-10-CM

## 2019-04-29 DIAGNOSIS — O10913 Unspecified pre-existing hypertension complicating pregnancy, third trimester: Secondary | ICD-10-CM

## 2019-04-29 NOTE — Patient Instructions (Addendum)
New Induction of Labor Process for [place] and [place]  In Fall 2020 [facility] and [place] changed it's process for scheduling inductions of labor to create more induction slots and to make sure patients get COVID-19 testing in advance. After you have been tested you need to quarantine so that you do not get infected after your test. You should not go anywhere after your test except necessary medical appointments.  You have been scheduled for induction of labor on 05/02/19 at 12:00 am (midnight).  Please arrive on 05/01/19 at 11:45 pm at Entrance C, Maternity Assessment Unit (MAU) security desk unless you are called to be placed on hold. You may eat a light meal before coming to the hospital.  Thank you,  [facility] induction is when steps are taken to cause a pregnant woman to begin the labor process. Most women go into labor on their own between 37 weeks and 42 weeks of pregnancy. When this does not happen or when there is a medical need for labor to begin, steps may be taken to induce labor. Labor induction causes a pregnant woman's uterus to contract. It also causes the cervix to soften (ripen), open (dilate), and thin out (efface). Usually, labor is not induced before 39 weeks of pregnancy unless there is a medical reason to do so. Your health care provider will determine if labor induction is needed. Before inducing labor, your health care provider will consider a number of factors, including:  Your medical condition and your baby's.  How many weeks along you are in your pregnancy.  How mature your baby's lungs are.  The condition of your cervix.  The position of your baby.  The size of your birth canal. What are some reasons for labor induction? Labor may be induced if:  Your health or your baby's health is at risk.  Your pregnancy is overdue by 1 week or more.  Your water breaks but labor  does not start on its own.  There is a low amount of amniotic fluid around your baby. You may also choose (elect) to have labor induced at a certain time. Generally, elective labor induction is done no earlier than 39 weeks of pregnancy. What methods are used for labor induction? Methods used for labor induction include:  Prostaglandin medicine. This medicine starts contractions and causes the cervix to dilate and ripen. It can be taken by mouth (orally) or by being inserted into the vagina (suppository).  Inserting a small, thin tube (catheter) with a balloon into the vagina and then expanding the balloon with water to dilate the cervix.  Stripping the membranes. In this method, your health care provider gently separates amniotic sac tissue from the cervix. This causes the cervix to stretch, which in turn causes the release of a hormone called progesterone. The hormone causes the uterus to contract. This procedure is often done during an office visit, after which you will be sent home to wait for contractions to begin.  Breaking the water. In this method, your health care provider uses a small instrument to make a small hole in the amniotic sac. This eventually causes the amniotic sac to break. Contractions should begin after a few hours.  Medicine to trigger or strengthen contractions. This medicine is given through an IV that is inserted into a vein in your arm. Except for membrane stripping, which can be done in a clinic, labor induction is done in  the hospital so that you and your baby can be carefully monitored. How long does it take for labor to be induced? The length of time it takes to induce labor depends on how ready your body is for labor. Some inductions can take up to 2-3 days, while others may take less than a day. Induction may take longer if:  You are induced early in your pregnancy.  It is your first pregnancy.  Your cervix is not ready. What are some risks associated with  labor induction? Some risks associated with labor induction include:  Changes in fetal heart rate, such as being too high, too low, or irregular (erratic).  Failed induction.  Infection in the mother or the baby.  Increased risk of having a cesarean delivery.  Fetal death.  Breaking off (abruption) of the placenta from the uterus (rare).  Rupture of the uterus (very rare). When induction is needed for medical reasons, the benefits of induction generally outweigh the risks. What are some reasons for not inducing labor? Labor induction should not be done if:  Your baby does not tolerate contractions.  You have had previous surgeries on your uterus, such as a myomectomy, removal of fibroids, or a vertical scar from a previous cesarean delivery.  Your placenta lies very low in your uterus and blocks the opening of the cervix (placenta previa).  Your baby is not in a head-down position.  The umbilical cord drops down into the birth canal in front of the baby.  There are unusual circumstances, such as the baby being very early (premature).  You have had more than 2 previous cesarean deliveries. Summary  Labor induction is when steps are taken to cause a pregnant woman to begin the labor process.  Labor induction causes a pregnant woman's uterus to contract. It also causes the cervix to ripen, dilate, and efface.  Labor is not induced before 39 weeks of pregnancy unless there is a medical reason to do so.  When induction is needed for medical reasons, the benefits of induction generally outweigh the risks. This information is not intended to replace advice given to you by your health care provider. Make sure you discuss any questions you have with your health care provider. Document Revised: 03/01/2017 Document Reviewed: 04/11/2016 Elsevier Patient Education  [place].   Balloon Catheter Placement for Cervical Ripening Balloon catheter placement for cervical  ripening is a procedure to help your cervix start to soften (ripen) and open (dilate). It is done to prepare your body for labor induction. During this procedure, a thin tube (catheter) is placed through your cervix. A tiny balloon attached to the catheter is inflated with water. Pressure from the balloon is what helps your cervix start to open. Cervical ripening with a balloon catheter can make labor induction shorter and easier. You may have this procedure if:  Your cervix is not ready for labor.  Your health care provider has planned labor induction.  You are not having twins or multiples.  Your baby is in the head-down position.  You do not have any other pregnancy complications that require you to be monitored in the hospital after balloon catheter placement. If your health care provider has recommended labor induction to stimulate a vaginal birth, this procedure may be started the day before induction. You will go home with the balloon in place and return to start induction in 12-24 hours. You may have this procedure and stay in the hospital so that your progress can be  monitored as well. Tell a health care provider about:  Any allergies you have.  All medicines you are taking, including vitamins, herbs, eye drops, creams, and over-the-counter medicines.  Any blood disorders you have.  Any surgeries you have had.  Any medical conditions you have. What are the risks? Generally, this is a safe procedure. However, problems may occur, including:  Infection.  Bleeding.  Cramping or pain.  Difficulty passing urine.  The baby moving from the head-down position to a position with the feet or buttocks down (breech position). What happens before the procedure?  Your health care provider may check your baby's heartbeat (fetal monitoring) before the procedure.  You may be asked to empty your bladder. What happens during the procedure?   You will be positioned on the exam table  as if you were having a pelvic exam or Pap test.  Your health care provider may insert a medical instrument into your vagina (speculum) to see your cervix.  Your cervix may be cleaned with a germ-killing solution.  The catheter will be inserted through the opening of your cervix.  A balloon on the end of the catheter will be inflated with sterile water. Some catheters have two balloons, one on each side of the cervix.  Depending on the type of balloon catheter, the end of the catheter may be left free outside your cervix or taped to your leg. The procedure may vary among health care providers and hospitals. What can I expect after the procedure?  After the procedure, it is common to have: ? A feeling of pressure inside your vagina. ? Light vaginal bleeding (spotting).  You may have fetal monitoring before going home.  You may be sent home with the catheter in place and asked to return to start your induction in about 12-24 hours. Follow these instructions at home:  Take over-the-counter and prescription medicines only as told by your health care provider.  Return to your normal activities at home as told by your health care provider. Ask your health care provider what activities are safe for you. Do not leave home until you return for labor induction.  You may shower at home. Do not take baths, swim, or use a hot tub unless your health care provider approves.  As your cervix opens, your catheter and balloon may fall out before you return for labor induction. Ask your health care provider what you should do if this happens.  Keep all follow-up visits as told by your health care provider. This is important. You will need to return to start induction as told by your health care provider. Contact your health care provider if:  You have chills or a fever.  You have constant pain or cramps (not contractions).  You have trouble passing urine.  Your water breaks.  You have vaginal  bleeding that is heavier than spotting.  You have contractions that start to last longer and come closer together (about every 5 minutes).  The balloon catheter falls out before you return to start your induction. Summary  Cervical ripening with placement of a balloon catheter is an outpatient procedure to prepare you for labor induction.  Cervical ripening with a balloon catheter helps your cervix start to open for birth.  You will be positioned on the exam table. The catheter will be inserted through the opening of your cervix. A balloon on the end of the catheter will be inflated with water.  Pressure from the balloon will cause ripening of your  cervix. You will go home with the balloon in place and return to start induction in 12-24 hours.  Contact your health care provider if you have pain, fever, vaginal bleeding, or trouble passing urine. Also contact him or her if your water breaks, you start to go into labor, or your balloon catheter falls out before you return to start your induction. This information is not intended to replace advice given to you by your health care provider. Make sure you discuss any questions you have with your health care provider. Document Revised: 10/25/2017 Document Reviewed: 10/25/2017 Elsevier Patient Education  [place].

## 2019-04-29 NOTE — Progress Notes (Signed)
   PRENATAL VISIT NOTE  Subjective:  Leslie Skinner is a 24 y.o. G2P1001 at [redacted]w[redacted]d being seen today for ongoing prenatal care.  She is currently monitored for the following issues for this high-risk pregnancy and has Chronic hypertension during pregnancy, antepartum; GERD (gastroesophageal reflux disease); Migraine; Scoliosis; Obesity, Class III, BMI 40-49.9 (morbid obesity) ([place]); Anxiety state, unspecified; Pre-diabetes; Pyelonephritis affecting pregnancy; Current mild episode of major depressive disorder ([place]); Chronic bilateral thoracic back pain; and Supervision of high risk pregnancy, antepartum on their problem list.  Patient reports no complaints.  Contractions: Not present. Vag. Bleeding: None.  Movement: Present. Denies leaking of fluid.   The following portions of the patient's history were reviewed and updated as appropriate: allergies, current medications, past family history, past medical history, past social history, past surgical history and problem list.   Objective:   Vitals:   04/29/19 1007  BP: 140/79  Pulse: 90  Temp: 98.1 F (36.7 C)  Weight: 252 lb 6.4 oz (114.5 kg)    Fetal Status: Fetal Heart Rate (bpm): 153 Fundal Height: 42 cm Movement: Present  Presentation: Vertex  General:  Alert, oriented and cooperative. Patient is in no acute distress.  Skin: Skin is warm and dry. No rash noted.   Cardiovascular: Normal heart rate noted  Respiratory: Normal respiratory effort, no problems with respiration noted  Abdomen: Soft, gravid, appropriate for gestational age.  Pain/Pressure: Absent     Pelvic: Cervical exam performed Dilation: Fingertip Effacement (%): Thick Station: Ballotable  Extremities: Normal range of motion.  Edema: None  Mental Status: Normal mood and affect. Normal behavior. Normal judgment and thought content.   Assessment and Plan:  Pregnancy: G2P1001 at [redacted]w[redacted]d 1. Supervision of high risk pregnancy, antepartum - Patient doing well, no  complaints - Educated and discussed plan of care with IOL, discussed with patient recommend delivery 39-40 weeks with CHTN. Patient is amendable to plan but wants induction to be over the weekend d/t child care  - IOL scheduled for [place] on 2/20, discussed with patient to arrive to hospital on 2/19 @2345 - orders for admission placed.  - Discussed with patient process of IOL with cytotec and FB, discussed with pt that pain medication can be given prior to FB insertion, patient verbalizes understanding   2. Chronic hypertension during pregnancy, antepartum - BP 140/79 today - Patient denies HA, vision changes or RUQ pain   3. Obesity, Class III, BMI 40-49.9 (morbid obesity) ([facility]) - TWG 32lb during pregnancy  - EFW Est. FW:    3643  gm      8 lb 1 oz     86  % at 37.6 weeks   Term labor symptoms and general obstetric precautions including but not limited to vaginal bleeding, contractions, leaking of fluid and fetal movement were reviewed in detail with the patient. Please refer to After Visit Summary for other counseling recommendations.   Return in about 5 weeks (around 06/01/2019) for POSTPARTUM.  Future Appointments  Date Time Provider [place]  05/02/2019 12:00 AM [facility] [place] None    Lajean Manes, CNM

## 2019-04-30 ENCOUNTER — Telehealth (HOSPITAL_COMMUNITY): Payer: Self-pay | Admitting: *Deleted

## 2019-04-30 ENCOUNTER — Encounter (HOSPITAL_COMMUNITY): Payer: Self-pay | Admitting: *Deleted

## 2019-04-30 NOTE — Telephone Encounter (Signed)
Preadmission screen  

## 2019-05-02 ENCOUNTER — Encounter (HOSPITAL_COMMUNITY): Payer: Self-pay | Admitting: Family Medicine

## 2019-05-02 ENCOUNTER — Inpatient Hospital Stay (HOSPITAL_COMMUNITY): Payer: Medicaid Other | Admitting: Anesthesiology

## 2019-05-02 ENCOUNTER — Other Ambulatory Visit: Payer: Self-pay

## 2019-05-02 ENCOUNTER — Inpatient Hospital Stay (HOSPITAL_COMMUNITY)
Admission: AD | Admit: 2019-05-02 | Discharge: 2019-05-04 | DRG: 807 | Disposition: A | Discharge status: Home / Self Care | Payer: Medicaid Other | Attending: Family Medicine | Admitting: Family Medicine

## 2019-05-02 ENCOUNTER — Inpatient Hospital Stay (HOSPITAL_COMMUNITY): Payer: Medicaid Other

## 2019-05-02 DIAGNOSIS — F4323 Adjustment disorder with mixed anxiety and depressed mood: Secondary | ICD-10-CM

## 2019-05-02 DIAGNOSIS — E66813 Obesity, class 3: Secondary | ICD-10-CM | POA: Diagnosis present

## 2019-05-02 DIAGNOSIS — F419 Anxiety disorder, unspecified: Secondary | ICD-10-CM | POA: Diagnosis present

## 2019-05-02 DIAGNOSIS — F329 Major depressive disorder, single episode, unspecified: Secondary | ICD-10-CM | POA: Diagnosis present

## 2019-05-02 DIAGNOSIS — M546 Pain in thoracic spine: Secondary | ICD-10-CM | POA: Diagnosis present

## 2019-05-02 DIAGNOSIS — O1002 Pre-existing essential hypertension complicating childbirth: Principal | ICD-10-CM | POA: Diagnosis present

## 2019-05-02 DIAGNOSIS — O99214 Obesity complicating childbirth: Secondary | ICD-10-CM | POA: Diagnosis present

## 2019-05-02 DIAGNOSIS — O99814 Abnormal glucose complicating childbirth: Secondary | ICD-10-CM | POA: Diagnosis present

## 2019-05-02 DIAGNOSIS — M419 Scoliosis, unspecified: Secondary | ICD-10-CM | POA: Diagnosis present

## 2019-05-02 DIAGNOSIS — F411 Generalized anxiety disorder: Secondary | ICD-10-CM | POA: Diagnosis present

## 2019-05-02 DIAGNOSIS — Z20822 Contact with and (suspected) exposure to covid-19: Secondary | ICD-10-CM | POA: Diagnosis present

## 2019-05-02 DIAGNOSIS — O99344 Other mental disorders complicating childbirth: Secondary | ICD-10-CM | POA: Diagnosis present

## 2019-05-02 DIAGNOSIS — R7303 Prediabetes: Secondary | ICD-10-CM | POA: Diagnosis present

## 2019-05-02 DIAGNOSIS — O26893 Other specified pregnancy related conditions, third trimester: Secondary | ICD-10-CM | POA: Diagnosis present

## 2019-05-02 DIAGNOSIS — Z7722 Contact with and (suspected) exposure to environmental tobacco smoke (acute) (chronic): Secondary | ICD-10-CM | POA: Diagnosis present

## 2019-05-02 DIAGNOSIS — O10919 Unspecified pre-existing hypertension complicating pregnancy, unspecified trimester: Secondary | ICD-10-CM | POA: Diagnosis present

## 2019-05-02 DIAGNOSIS — Z3A39 39 weeks gestation of pregnancy: Secondary | ICD-10-CM

## 2019-05-02 DIAGNOSIS — G8929 Other chronic pain: Secondary | ICD-10-CM | POA: Diagnosis present

## 2019-05-02 DIAGNOSIS — Z30430 Encounter for insertion of intrauterine contraceptive device: Secondary | ICD-10-CM | POA: Diagnosis not present

## 2019-05-02 DIAGNOSIS — Z30431 Encounter for routine checking of intrauterine contraceptive device: Secondary | ICD-10-CM | POA: Diagnosis not present

## 2019-05-02 DIAGNOSIS — O0990 Supervision of high risk pregnancy, unspecified, unspecified trimester: Secondary | ICD-10-CM

## 2019-05-02 LAB — COMPREHENSIVE METABOLIC PANEL
ALT: 19 U/L (ref 0–44)
AST: 32 U/L (ref 15–41)
Albumin: 2.5 g/dL — ABNORMAL LOW (ref 3.5–5.0)
Alkaline Phosphatase: 191 U/L — ABNORMAL HIGH (ref 38–126)
Anion gap: 9 (ref 5–15)
BUN: 6 mg/dL (ref 6–20)
CO2: 22 mmol/L (ref 22–32)
Calcium: 8.6 mg/dL — ABNORMAL LOW (ref 8.9–10.3)
Chloride: 104 mmol/L (ref 98–111)
Creatinine, Ser: 0.75 mg/dL (ref 0.44–1.00)
GFR calc Af Amer: >60 mL/min (ref >60)
GFR calc non Af Amer: >60 mL/min (ref >60)
Glucose, Bld: 124 mg/dL — ABNORMAL HIGH (ref 70–99)
Potassium: 3.2 mmol/L — ABNORMAL LOW (ref 3.5–5.1)
Sodium: 135 mmol/L (ref 135–145)
Total Bilirubin: 0.6 mg/dL (ref 0.3–1.2)
Total Protein: 6.1 g/dL — ABNORMAL LOW (ref 6.5–8.1)

## 2019-05-02 LAB — RPR: RPR Ser Ql: NONREACTIVE

## 2019-05-02 LAB — CBC
HCT: 31.2 % — ABNORMAL LOW (ref 36.0–46.0)
HCT: 32.2 % — ABNORMAL LOW (ref 36.0–46.0)
Hemoglobin: 10 g/dL — ABNORMAL LOW (ref 12.0–15.0)
Hemoglobin: 10.1 g/dL — ABNORMAL LOW (ref 12.0–15.0)
MCH: 23.9 pg — ABNORMAL LOW (ref 26.0–34.0)
MCH: 23.4 pg — ABNORMAL LOW (ref 26.0–34.0)
MCHC: 32.1 g/dL (ref 30.0–36.0)
MCHC: 31.4 g/dL (ref 30.0–36.0)
MCV: 74.5 fL — ABNORMAL LOW (ref 80.0–100.0)
MCV: 74.5 fL — ABNORMAL LOW (ref 80.0–100.0)
Platelets: 269 10*3/uL (ref 150–400)
Platelets: 242 10*3/uL (ref 150–400)
RBC: 4.19 MIL/uL (ref 3.87–5.11)
RBC: 4.32 MIL/uL (ref 3.87–5.11)
RDW: 16.7 % — ABNORMAL HIGH (ref 11.5–15.5)
RDW: 16.5 % — ABNORMAL HIGH (ref 11.5–15.5)
WBC: 12 10*3/uL — ABNORMAL HIGH (ref 4.0–10.5)
WBC: 10.4 10*3/uL (ref 4.0–10.5)
nRBC: 0.2 % (ref 0.0–0.2)
nRBC: 0.2 % (ref 0.0–0.2)

## 2019-05-02 LAB — GLUCOSE, CAPILLARY: Glucose-Capillary: 102 mg/dL — ABNORMAL HIGH (ref 70–99)

## 2019-05-02 LAB — TYPE AND SCREEN
ABO/RH(D): A POS
Antibody Screen: NEGATIVE

## 2019-05-02 LAB — SARS CORONAVIRUS 2 (TAT 6-24 HRS): SARS Coronavirus 2: NEGATIVE

## 2019-05-02 LAB — ABO/RH: ABO/RH(D): A POS

## 2019-05-02 MED ORDER — FENTANYL CITRATE (PF) 100 MCG/2ML IJ SOLN
INTRAMUSCULAR | Status: AC
  Filled 2019-05-02: qty 2

## 2019-05-02 MED ORDER — LACTATED RINGERS IV SOLN: 500.0000 mL | Freq: Once | INTRAVENOUS | Status: DC

## 2019-05-02 MED ORDER — MISOPROSTOL 50MCG HALF TABLET
ORAL_TABLET | ORAL | Status: AC
  Filled 2019-05-02: qty 1

## 2019-05-02 MED ORDER — FENTANYL-BUPIVACAINE-NACL 0.5-0.125-0.9 MG/250ML-% EP SOLN
12.0000 mL/h | EPIDURAL | Status: DC | PRN
  Administered 2019-05-03: 12 mL/h via EPIDURAL
  Filled 2019-05-02: qty 250

## 2019-05-02 MED ORDER — SOD CITRATE-CITRIC ACID 500-334 MG/5ML PO SOLN: 30.0000 mL | ORAL | Status: DC | PRN

## 2019-05-02 MED ORDER — LACTATED RINGERS IV SOLN
500.0000 mL | INTRAVENOUS | Status: DC | PRN
  Administered 2019-05-03: 1000 mL via INTRAVENOUS

## 2019-05-02 MED ORDER — DIPHENHYDRAMINE HCL 50 MG/ML IJ SOLN: 12.5000 mg | INTRAMUSCULAR | Status: DC | PRN

## 2019-05-02 MED ORDER — FENTANYL-BUPIVACAINE-NACL 0.5-0.125-0.9 MG/250ML-% EP SOLN
EPIDURAL | Status: AC
  Filled 2019-05-02: qty 250

## 2019-05-02 MED ORDER — LEVONORGESTREL 19.5 MCG/DAY IU IUD
INTRAUTERINE_SYSTEM | Freq: Once | INTRAUTERINE | Status: AC
  Administered 2019-05-03: 1 via INTRAUTERINE
  Filled 2019-05-02: qty 1

## 2019-05-02 MED ORDER — OXYTOCIN BOLUS FROM INFUSION
500.0000 mL | Freq: Once | INTRAVENOUS | Status: AC
  Administered 2019-05-03: 500 mL via INTRAVENOUS

## 2019-05-02 MED ORDER — OXYTOCIN 40 UNITS IN NORMAL SALINE INFUSION - SIMPLE MED
1.0000 m[IU]/min | INTRAVENOUS | Status: DC
  Administered 2019-05-02: 2 m[IU]/min via INTRAVENOUS

## 2019-05-02 MED ORDER — ACETAMINOPHEN 325 MG PO TABS: 650.0000 mg | ORAL_TABLET | ORAL | Status: DC | PRN

## 2019-05-02 MED ORDER — PHENYLEPHRINE 40 MCG/ML (10ML) SYRINGE FOR IV PUSH (FOR BLOOD PRESSURE SUPPORT): 80.0000 ug | PREFILLED_SYRINGE | INTRAVENOUS | Status: DC | PRN

## 2019-05-02 MED ORDER — FENTANYL CITRATE (PF) 100 MCG/2ML IJ SOLN
100.0000 ug | INTRAMUSCULAR | Status: DC | PRN
  Administered 2019-05-02 (×5): 100 ug via INTRAVENOUS
  Filled 2019-05-02 (×4): qty 2

## 2019-05-02 MED ORDER — ONDANSETRON HCL 4 MG/2ML IJ SOLN: 4.0000 mg | Freq: Four times a day (QID) | INTRAMUSCULAR | Status: DC | PRN

## 2019-05-02 MED ORDER — TERBUTALINE SULFATE 1 MG/ML IJ SOLN: 0.2500 mg | Freq: Once | INTRAMUSCULAR | Status: DC | PRN

## 2019-05-02 MED ORDER — EPHEDRINE 5 MG/ML INJ: 10.0000 mg | INTRAVENOUS | Status: DC | PRN

## 2019-05-02 MED ORDER — LIDOCAINE HCL (PF) 1 % IJ SOLN: 30.0000 mL | INTRAMUSCULAR | Status: DC | PRN

## 2019-05-02 MED ORDER — PHENYLEPHRINE 40 MCG/ML (10ML) SYRINGE FOR IV PUSH (FOR BLOOD PRESSURE SUPPORT)
80.0000 ug | PREFILLED_SYRINGE | INTRAVENOUS | Status: DC | PRN
  Filled 2019-05-02: qty 10

## 2019-05-02 MED ORDER — OXYTOCIN 40 UNITS IN NORMAL SALINE INFUSION - SIMPLE MED
2.5000 [IU]/h | INTRAVENOUS | Status: DC
  Administered 2019-05-03: 2.5 [IU]/h via INTRAVENOUS
  Filled 2019-05-02: qty 1000

## 2019-05-02 MED ORDER — MISOPROSTOL 50MCG HALF TABLET
50.0000 ug | ORAL_TABLET | ORAL | Status: DC
  Administered 2019-05-02 (×4): 50 ug via BUCCAL
  Filled 2019-05-02 (×3): qty 1

## 2019-05-02 MED ORDER — MISOPROSTOL 25 MCG QUARTER TABLET
25.0000 ug | ORAL_TABLET | ORAL | Status: DC | PRN
  Administered 2019-05-02: 25 ug via VAGINAL
  Filled 2019-05-02 (×2): qty 1

## 2019-05-02 MED ORDER — LACTATED RINGERS IV SOLN: INTRAVENOUS | Status: DC

## 2019-05-02 NOTE — Progress Notes (Deleted)
OBSTETRIC ADMISSION HISTORY AND PHYSICAL  Leslie Skinner is a 24 y.o. female G2P1001 with IUP at [redacted]w[redacted]d presenting for IOL 2/2 cHTN. She reports +FMs. No LOF, VB, blurry vision, headaches, peripheral edema, or RUQ pain. She plans on breastfeeding. She requests IUD for birth control.  Dating: By LMP --->  Estimated Date of Delivery: 05/06/19  Sono:   @[redacted]w[redacted]d , normal anatomy, cephalic presentation, [id number], 86%ile, EFW 8#1  Prenatal History/Complications: cHTN- no meds, on aspirin Pre-diabetes Obesity  Past Medical History: Past Medical History:  Diagnosis Date  . Acanthosis nigricans, acquired   . Constipation   . Diabetes mellitus type II    Young age around 64 per pt  . GERD (gastroesophageal reflux disease)   . Goiter   . Hypertension   . Migraines   . Obesity   . Osteochondroma of lower leg   . Pollen allergies   . Pregnancy induced hypertension   . Scoliosis   . Spinal headache     Past Surgical History: Past Surgical History:  Procedure Laterality Date  . LOWER LEG SOFT TISSUE TUMOR EXCISION  2011  . OSTEOCHONDROMA EXCISION      Obstetrical History: OB History    Gravida  2   Para  1   Term  1   Preterm      AB      Living  1     SAB      TAB      Ectopic      Multiple  0   Live Births  1           Social History: Social History   Socioeconomic History  . Marital status: Single    Spouse name: Not on file  . Number of children: 1  . Years of education: 12th  . Highest education level: High school graduate  Occupational History  . Not on file  Tobacco Use  . Smoking status: Passive Smoke Exposure - Never Smoker  . Smokeless tobacco: Never Used  Substance and Sexual Activity  . Alcohol use: No  . Drug use: No  . Sexual activity: Yes    Birth control/protection: None  Other Topics Concern  . Not on file  Social History Narrative   Lives with Mom [name]), sister Joelene Millin Minor 1993), and nephew ([name], Tavaris  Little 2011).   12th grade at [employer] did not graduate- will finish fall 2014.      Patient is living with her sisters Joelene Millin Minor and Janett Billow Minor) as of May 2020. She has her one year old son [name]. Patient reported she has been do depressed/down that she went to live with siblings. She really only talk to her one year son, which he is really not talking. Derl Barrow, RN 12/15/18   Social Determinants of Health   Financial Resource Strain:   . Difficulty of Paying Living Expenses: Not on file  Food Insecurity:   . Worried About Charity fundraiser in the Last Year: Not on file  . Ran Out of Food in the Last Year: Not on file  Transportation Needs:   . Lack of Transportation (Medical): Not on file  . Lack of Transportation (Non-Medical): Not on file  Physical Activity: Inactive  . Days of Exercise per Week: 0 days  . Minutes of Exercise per Session: 0 min  Stress: Stress Concern Present  . Feeling of Stress : Very much  Social Connections: Somewhat Isolated  . Frequency of  Communication with Friends and Family: More than three times a week  . Frequency of Social Gatherings with Friends and Family: More than three times a week  . Attends Religious Services: 1 to 4 times per year  . Active Member of Clubs or Organizations: No  . Attends Archivist Meetings: Never  . Marital Status: Never married    Family History: Family History  Problem Relation Age of Onset  . Diabetes Mother   . Ulcers Mother   . Thyroid disease Mother   . Cancer Mother   . Obesity Mother   . Heart disease Mother   . Heart attack Mother        caused death   . Diabetes Maternal Grandmother   . Stroke Maternal Grandmother   . Ulcers Maternal Grandmother   . Diabetes Maternal Grandfather   . GER disease Maternal Grandfather   . Heart disease Maternal Grandfather   . GER disease Sister   . Diabetes Father   . GER disease Father   . Obesity Sister   . Diabetes Paternal  Grandmother   . Thyroid disease Paternal Grandmother   . Diabetes Paternal Grandfather   . Heart disease Maternal Aunt   . Diabetes Maternal Aunt   . Thyroid disease Maternal Aunt   . Cancer Maternal Aunt   . Obesity Maternal Aunt   . GER disease Maternal Aunt     Allergies: Allergies  Allergen Reactions  . Morphine And Related Other (See Comments)    Hot flashes, can't breathe  . Tramadol Other (See Comments)    Hot flashes     Medications Prior to Admission  Medication Sig Dispense Refill Last Dose  . acetaminophen (TYLENOL) 500 MG tablet Take 1 tablet (500 mg total) by mouth every 6 (six) hours as needed. 30 tablet 0   . aspirin 81 MG chewable tablet Chew 1 tablet (81 mg total) by mouth daily. 30 tablet 2   . Blood Pressure Monitoring (BLOOD PRESSURE MONITOR AUTOMAT) DEVI 1 Device by Does not apply route daily. Automatic Blood pressure monitor with regular/large cuff. To monitor blood pressure at home on regular bases. ICD-10 Code: O09.90. 1 each 0   . cyclobenzaprine (FLEXERIL) 10 MG tablet Take 1 tablet (10 mg total) by mouth every 8 (eight) hours as needed for muscle spasms. 30 tablet 1   . Elastic Bandages & Supports (COMFORT FIT MATERNITY SUPP LG) MISC 1 Units by Does not apply route daily. 1 each 0   . famotidine (PEPCID) 40 MG tablet Take 1 tablet (40 mg total) by mouth at bedtime. (Patient not taking: Reported on 04/29/2019) 30 tablet 3   . ferrous sulfate 325 (65 FE) MG tablet Take 1 tablet (325 mg total) by mouth every other day. (Patient not taking: Reported on 04/29/2019) 15 tablet 3   . loperamide (IMODIUM A-D) 2 MG tablet Take 1 tablet (2 mg total) by mouth 4 (four) times daily as needed for diarrhea or loose stools. (Patient not taking: Reported on 04/21/2019) 30 tablet 0   . Prenatal Vit-Fe Fumarate-FA (PRENATAL COMPLETE) 14-0.4 MG TABS Take 1 tablet by mouth daily. 30 tablet 12   . sertraline (ZOLOFT) 50 MG tablet Take 1 tablet (50 mg total) by mouth daily. (Patient not  taking: Reported on 04/29/2019) 30 tablet 6      Review of Systems:  All systems reviewed and negative except as stated in HPI  PE: Blood pressure 118/61, pulse 88, temperature 98.7 F (37.1 C), temperature source  Oral, resp. rate 16, height 5\' 2"  (1.575 m), weight 115.4 kg, last menstrual period 07/30/2018, unknown if currently breastfeeding. General appearance: alert and cooperative Lungs: regular rate and effort Heart: regular rate  Abdomen: soft, non-tender Extremities: Homans sign is negative, no sign of DVT Presentation: cephalic EFM: [id number] bpm, moderate variability, 15x15 accels, no decels Toco: uterine irritibility Dilation: Closed Effacement (%): Thick Station: -3 Exam by:: Dr.Pawel Soules   Prenatal labs: ABO, Rh: --/--/A POS, A POS Performed at [facility], [place] [address], [place], [place] [address]  [phone]) Antibody: NEG (02/20 0045) Rubella: 3.99 (10/05 0859) RPR: Non Reactive (11/30 0843)  HBsAg: Negative (10/05 0859)  HIV: Non Reactive (11/30 0843)  GBS: Negative/-- (01/29 1129)  2 hr GTT 82/165/130  Prenatal Transfer Tool  Maternal Diabetes: No Genetic Screening: Normal Maternal Ultrasounds/Referrals: Normal Fetal Ultrasounds or other Referrals:  Referred to Materal Fetal Medicine  Maternal Substance Abuse:  No Significant Maternal Medications:  Aspirin Significant Maternal Lab Results: Group B Strep negative  Results for orders placed or performed during the hospital encounter of 05/02/19 (from the past 24 hour(s))  SARS CORONAVIRUS 2 (TAT 6-24 HRS) Nasopharyngeal Nasopharyngeal Swab   Collection Time: 05/02/19 12:23 AM   Specimen: Nasopharyngeal Swab  Result Value Ref Range   SARS Coronavirus 2 NEGATIVE NEGATIVE  Type and screen   Collection Time: 05/02/19 12:45 AM  Result Value Ref Range   ABO/RH(D) A POS    Antibody Screen NEG    Sample Expiration      05/05/2019,2359 Performed at [facility], [place] [address], [place],  [place] [address]   ABO/Rh   Collection Time: 05/02/19 12:45 AM  Result Value Ref Range   ABO/RH(D)      A POS Performed at [facility] [address] [place], [place] [address]   CBC   Collection Time: 05/02/19  1:00 AM  Result Value Ref Range   WBC 12.0 (H) 4.0 - 10.5 K/uL   RBC 4.19 3.87 - 5.11 MIL/uL   Hemoglobin 10.0 (L) 12.0 - 15.0 g/dL   HCT 31.2 (L) 36.0 - 46.0 %   MCV 74.5 (L) 80.0 - 100.0 fL   MCH 23.9 (L) 26.0 - 34.0 pg   MCHC 32.1 30.0 - 36.0 g/dL   RDW 16.7 (H) 11.5 - 15.5 %   Platelets 269 150 - 400 K/uL   nRBC 0.2 0.0 - 0.2 %  Comprehensive metabolic panel   Collection Time: 05/02/19  1:00 AM  Result Value Ref Range   Sodium 135 135 - 145 mmol/L   Potassium 3.2 (L) 3.5 - 5.1 mmol/L   Chloride 104 98 - 111 mmol/L   CO2 22 22 - 32 mmol/L   Glucose, Bld 124 (H) 70 - 99 mg/dL   BUN 6 6 - 20 mg/dL   Creatinine, Ser 0.75 0.44 - 1.00 mg/dL   Calcium 8.6 (L) 8.9 - 10.3 mg/dL   Total Protein 6.1 (L) 6.5 - 8.1 g/dL   Albumin 2.5 (L) 3.5 - 5.0 g/dL   AST 32 15 - 41 U/L   ALT 19 0 - 44 U/L   Alkaline Phosphatase 191 (H) 38 - 126 U/L   Total Bilirubin 0.6 0.3 - 1.2 mg/dL   GFR calc non Af Amer >60 >60 mL/min   GFR calc Af Amer >60 >60 mL/min   Anion gap 9 5 - 15    Patient Active Problem List   Diagnosis Date Noted  . Supervision  of high risk pregnancy, antepartum 12/15/2018  . Chronic bilateral thoracic back pain 08/05/2018  . Current mild episode of major depressive disorder ([place]) 11/14/2017  . Pyelonephritis affecting pregnancy 10/07/2017  . Anxiety state, unspecified 06/12/2012  . Pre-diabetes 06/12/2012  . Obesity, Class III, BMI 40-49.9 (morbid obesity) ([place]) 08/23/2011  . Scoliosis 11/22/2010  . Migraine 11/08/2010  . GERD (gastroesophageal reflux disease)   . Chronic hypertension during pregnancy, antepartum 05/16/2010    Assessment: JULLIETTE HAGEMAN is a 24 y.o. G2P1001 at [redacted]w[redacted]d here for IOL 2/2 cHTN  1. Labor: start with cytotec. Consider FB  with next check. 2. FWB: Cat I, EFW 9# 3. Pain: per patient request 4. GBS: negative 5. Contraception: pp IUD, consented and ordered   Plan: Risks and benefits of induction were reviewed, including failure of method, prolonged labor, need for further intervention, risk of cesarean.  Patient and family seem to understand these risks and wish to proceed. Options of cytotec, foley bulb, AROM, and pitocin reviewed, with use of each discussed.   [place], DO  05/02/2019, 4:49 AM

## 2019-05-02 NOTE — Progress Notes (Signed)
Labor Progress Note Leslie Skinner is a 24 y.o. G2P1001 at [redacted]w[redacted]d presented for IOL CHTN  S:  Patient comfortable. Starting to feel intermittent contractions  O:  BP 132/80   Pulse 83   Temp 98.5 F (36.9 C) (Oral)   Resp 18   Ht 5\' 2"  (1.575 m)   Wt 115.4 kg   LMP 07/30/2018 (LMP Unknown)   BMI 46.53 kg/m   Fetal Tracing:  Baseline: 130 Variability: moderate Accels: 15x15 Decels: none  Toco: 2-5   CVE: Dilation: Closed Effacement (%): Thick Cervical Position: Posterior Station: -3 Presentation: Vertex Exam by:: M. Early, RN   A&P: 24 y.o. G2P1001 [redacted]w[redacted]d IOL CHTN #Labor: Continue cytotec #Pain: per patient request #FWB: Cat 1 #GBS negative  Wende Mott, CNM 2:21 PM

## 2019-05-02 NOTE — Progress Notes (Signed)
Labor Progress Note Leslie Skinner is a 24 y.o. G2P1001 at [redacted]w[redacted]d presented for IOL for CHTN  S:  Patient comfortable  O:  BP 127/71   Pulse 78   Temp 98.1 F (36.7 C) (Oral)   Resp 18   Ht 5\' 2"  (1.575 m)   Wt 115.4 kg   LMP 07/30/2018 (LMP Unknown)   BMI 46.53 kg/m   Fetal Tracing:  Baseline: 130 Variability: moderate Accels: 15x15 Decels: none  Toco: 4-6   CVE: 0/thick/-3   A&P: 24 y.o. G2P1001 [redacted]w[redacted]d IOL CHTN #Labor: Unable to place FB at this time. Will continue cytotec #Pain: IV fentanyl #FWB: Cat 1 #GBS negative  Wende Mott, CNM 9:20 AM

## 2019-05-02 NOTE — Progress Notes (Signed)
LABOR PROGRESS NOTE  Leslie Skinner is a 24 y.o. G2P1001 at [redacted]w[redacted]d  admitted for IOL cHTN (no meds) and preDM.  Subjective: Chronic back pain but otherwise not feeling much  Objective: BP (!) 146/89   Pulse 88   Temp 97.8 F (36.6 C) (Oral)   Resp 16   Ht 5\' 2"  (1.575 m)   Wt 115.4 kg   LMP 07/30/2018 (LMP Unknown)   BMI 46.53 kg/m  or  Vitals:   05/02/19 1707 05/02/19 1814 05/02/19 1903 05/02/19 2052  BP: (!) 135/93 (!) 141/73 137/77 (!) 146/89  Pulse: 94 86 86 88  Resp: 20 20 18 16   Temp:      TempSrc:      Weight:      Height:         Dilation: Closed Effacement (%): Thick Cervical Position: Posterior Station: -3 Presentation: Vertex(confirmed by bedsaide u/s) Exam by:: Dione Plover MD FHT: baseline rate 145, moderate varibility, +acel, -decel Toco: none  Labs: Lab Results  Component Value Date   WBC 12.0 (H) 05/02/2019   HGB 10.0 (L) 05/02/2019   HCT 31.2 (L) 05/02/2019   MCV 74.5 (L) 05/02/2019   PLT 269 05/02/2019    Patient Active Problem List   Diagnosis Date Noted  . Supervision of high risk pregnancy, antepartum 12/15/2018  . Chronic bilateral thoracic back pain 08/05/2018  . Current mild episode of major depressive disorder ([place]) 11/14/2017  . Pyelonephritis affecting pregnancy 10/07/2017  . Anxiety state, unspecified 06/12/2012  . Pre-diabetes 06/12/2012  . Obesity, Class III, BMI 40-49.9 (morbid obesity) ([place]) 08/23/2011  . Scoliosis 11/22/2010  . Migraine 11/08/2010  . GERD (gastroesophageal reflux disease)   . Chronic hypertension during pregnancy, antepartum 05/16/2010    Assessment / Plan: 24 y.o. G2P1001 at [redacted]w[redacted]d here for IOL cHTN (no meds) and preDM.  Labor: unable to tolerate vaginal exam, vertex confirmed by [place]. S/p Miso x5. Discussed options including early epidural vs starting pitocin now and getting epidural once comfortable and checking cervix at that time. She is amenable to start pitocin, start 2x2 and then epidural once  uncomfortable.  Fetal Wellbeing:  Cat I Pain Control:  Planning on epidural GBS: neg Anticipated MOD:  SVD  cHTN: normotensive to mild range, ctm  PreDM: CBG 102 this AM, not checking routinely   Augustin Coupe, MD/[facility] OB Fellow  05/02/2019, 9:52 PM

## 2019-05-02 NOTE — Progress Notes (Signed)
Leslie Skinner is a 24 y.o. G2P1001 at [redacted]w[redacted]d admitted for IOL 2/2 cHTN  Subjective: Not feeling contractions  Objective: BP 118/61   Pulse 88   Temp 98.7 F (37.1 C) (Oral)   Resp 16   Ht 5\' 2"  (1.575 m)   Wt 115.4 kg   LMP 07/30/2018 (LMP Unknown)   BMI 46.53 kg/m  No intake/output data recorded.  FHT:  FHR: 135 bpm, variability: moderate,  accelerations:  Present,  decelerations:  Absent UC:   Difficult to pick up on TOCO  SVE:   Dilation: Closed Effacement (%): Thick Station: -3 Exam by:: Janetta Hora, RN  Labs: Lab Results  Component Value Date   WBC 12.0 (H) 05/02/2019   HGB 10.0 (L) 05/02/2019   HCT 31.2 (L) 05/02/2019   MCV 74.5 (L) 05/02/2019   PLT 269 05/02/2019    Assessment / Plan: 24 yo G2P1001 at 39.3 EGA here for IOL 2/2 cHTN  Labor: S/p cyto x2. Consider FB at next check. Patient does not tolerate cervical exams well, so may need speculum and pre-medication Fetal Wellbeing:  Category I Pain Control:  per patient request CHTN: blood pressures have been normotensive since arrival Pre-Diabetes: elevated glucose on arrival, patient did eat before coming in. CBG ordered for this morning I/D:  GBS negative Anticipated MOD:  vaginal  Kenta Laster L Jshaun Abernathy DO OB Fellow, [facility] 05/02/2019, 6:48 AM

## 2019-05-02 NOTE — Progress Notes (Signed)
Labor Progress Note Leslie Skinner is a 24 y.o. G2P1001 at [redacted]w[redacted]d presented for IOL for CHTN  S:  Patient resting.   O:  BP (!) 116/53   Pulse 70   Temp 98.5 F (36.9 C) (Oral)   Resp 18   Ht 5\' 2"  (1.575 m)   Wt 115.4 kg   LMP 07/30/2018 (LMP Unknown)   BMI 46.53 kg/m   Fetal Tracing:  Baseline: 130 Variability: moderate Accels: 15x15 Decels: none  Toco: occasional uc's   CVE: Dilation: Closed Effacement (%): Thick Cervical Position: Posterior Station: -3 Presentation: Vertex(by bedside US) Exam by:: M. Early, RN   A&P: 24 y.o. G2P1001 [redacted]w[redacted]d IOL for CHTN #Labor: Patient declined cervical exam. States it is too painful even with pain medication. Discussed reasons for cervical exams during an induction and methods of induction. Patient continued to decline cervical exam. Vertex verified by bedside u/s and cytotec given. Discussed importance of exam with next plan of care. Encouraged early epidural if needed.  #Pain: per patient request #FWB: Cat 1 #GBS negative  Wende Mott, CNM 5:26 PM

## 2019-05-03 ENCOUNTER — Encounter (HOSPITAL_COMMUNITY): Payer: Self-pay | Admitting: Family Medicine

## 2019-05-03 DIAGNOSIS — Z3A39 39 weeks gestation of pregnancy: Secondary | ICD-10-CM

## 2019-05-03 DIAGNOSIS — Z30430 Encounter for insertion of intrauterine contraceptive device: Secondary | ICD-10-CM

## 2019-05-03 DIAGNOSIS — Z30431 Encounter for routine checking of intrauterine contraceptive device: Secondary | ICD-10-CM

## 2019-05-03 DIAGNOSIS — O1002 Pre-existing essential hypertension complicating childbirth: Secondary | ICD-10-CM

## 2019-05-03 DIAGNOSIS — Z8659 Personal history of other mental and behavioral disorders: Secondary | ICD-10-CM | POA: Insufficient documentation

## 2019-05-03 HISTORY — DX: Personal history of other mental and behavioral disorders: Z86.59

## 2019-05-03 MED ORDER — SIMETHICONE 80 MG PO CHEW: 80.0000 mg | CHEWABLE_TABLET | ORAL | Status: DC | PRN

## 2019-05-03 MED ORDER — ACETAMINOPHEN 325 MG PO TABS
650.0000 mg | ORAL_TABLET | ORAL | Status: DC | PRN
  Administered 2019-05-03 – 2019-05-04 (×3): 650 mg via ORAL
  Filled 2019-05-03 (×3): qty 2

## 2019-05-03 MED ORDER — DIPHENHYDRAMINE HCL 25 MG PO CAPS: 25.0000 mg | ORAL_CAPSULE | Freq: Four times a day (QID) | ORAL | Status: DC | PRN

## 2019-05-03 MED ORDER — SODIUM BICARBONATE 8.4 % IV SOLN
INTRAVENOUS | Status: DC | PRN
  Administered 2019-05-03: 5 mL via EPIDURAL

## 2019-05-03 MED ORDER — SODIUM CHLORIDE (PF) 0.9 % IJ SOLN
INTRAMUSCULAR | Status: DC | PRN
  Administered 2019-05-02: 12 mL/h via EPIDURAL

## 2019-05-03 MED ORDER — LIDOCAINE HCL (PF) 1 % IJ SOLN
INTRAMUSCULAR | Status: DC | PRN
  Administered 2019-05-02: 10 mL via EPIDURAL
  Administered 2019-05-02: 2 mL via EPIDURAL

## 2019-05-03 MED ORDER — DIBUCAINE (PERIANAL) 1 % EX OINT: 1.0000 "application " | TOPICAL_OINTMENT | CUTANEOUS | Status: DC | PRN

## 2019-05-03 MED ORDER — TETANUS-DIPHTH-ACELL PERTUSSIS 5-2.5-18.5 LF-MCG/0.5 IM SUSP: 0.5000 mL | Freq: Once | INTRAMUSCULAR | Status: DC

## 2019-05-03 MED ORDER — BENZOCAINE-MENTHOL 20-0.5 % EX AERO
1.0000 "application " | INHALATION_SPRAY | CUTANEOUS | Status: DC | PRN
  Administered 2019-05-03: 1 via TOPICAL
  Filled 2019-05-03: qty 56

## 2019-05-03 MED ORDER — PRENATAL MULTIVITAMIN CH
1.0000 | ORAL_TABLET | Freq: Every day | ORAL | Status: DC
  Administered 2019-05-04: 1 via ORAL
  Filled 2019-05-03: qty 1

## 2019-05-03 MED ORDER — ONDANSETRON HCL 4 MG PO TABS: 4.0000 mg | ORAL_TABLET | ORAL | Status: DC | PRN

## 2019-05-03 MED ORDER — SENNOSIDES-DOCUSATE SODIUM 8.6-50 MG PO TABS
2.0000 | ORAL_TABLET | ORAL | Status: DC
  Administered 2019-05-03: 2 via ORAL
  Filled 2019-05-03: qty 2

## 2019-05-03 MED ORDER — ZOLPIDEM TARTRATE 5 MG PO TABS: 5.0000 mg | ORAL_TABLET | Freq: Every evening | ORAL | Status: DC | PRN

## 2019-05-03 MED ORDER — LORAZEPAM 2 MG/ML IJ SOLN
0.5000 mg | Freq: Once | INTRAMUSCULAR | Status: AC
  Administered 2019-05-03: 0.5 mg via INTRAVENOUS
  Filled 2019-05-03: qty 1

## 2019-05-03 MED ORDER — WITCH HAZEL-GLYCERIN EX PADS: 1.0000 "application " | MEDICATED_PAD | CUTANEOUS | Status: DC | PRN

## 2019-05-03 MED ORDER — IBUPROFEN 600 MG PO TABS
600.0000 mg | ORAL_TABLET | Freq: Four times a day (QID) | ORAL | Status: DC
  Administered 2019-05-03 – 2019-05-04 (×4): 600 mg via ORAL
  Filled 2019-05-03 (×4): qty 1

## 2019-05-03 MED ORDER — COCONUT OIL OIL: 1.0000 "application " | TOPICAL_OIL | Status: DC | PRN

## 2019-05-03 MED ORDER — ONDANSETRON HCL 4 MG/2ML IJ SOLN: 4.0000 mg | INTRAMUSCULAR | Status: DC | PRN

## 2019-05-03 NOTE — Anesthesia Procedure Notes (Signed)
Epidural Patient location during procedure: OB Start time: 05/02/2019 11:11 PM End time: 05/02/2019 11:21 PM  Staffing Anesthesiologist: Pervis Hocking, DO Performed: anesthesiologist   Preanesthetic Checklist Completed: patient identified, IV checked, risks and benefits discussed, monitors and equipment checked, pre-op evaluation and timeout performed  Epidural Patient position: sitting Prep: DuraPrep and site prepped and draped Patient monitoring: continuous pulse ox, blood pressure, heart rate and cardiac monitor Approach: midline Location: L3-L4 Injection technique: LOR air  Needle:  Needle type: Tuohy  Needle gauge: 17 G Needle length: 9 cm Needle insertion depth: 9 cm Catheter type: closed end flexible Catheter size: 19 Gauge Catheter at skin depth: 15 cm Test dose: negative  Assessment Sensory level: T8 Events: blood not aspirated, injection not painful, no injection resistance, no paresthesia and negative IV test  Additional Notes Patient identified. Risks/Benefits/Options discussed with patient including but not limited to bleeding, infection, nerve damage, paralysis, failed block, incomplete pain control, headache, blood pressure changes, nausea, vomiting, reactions to medication both or allergic, itching and postpartum back pain. Confirmed with bedside nurse the patient's most recent platelet count. Confirmed with patient that they are not currently taking any anticoagulation, have any bleeding history or any family history of bleeding disorders. Patient expressed understanding and wished to proceed. All questions were answered. Sterile technique was used throughout the entire procedure. Please see nursing notes for vital signs. Test dose was given through epidural catheter and negative prior to continuing to dose epidural or start infusion. Warning signs of high block given to the patient including shortness of breath, tingling/numbness in hands, complete motor  block, or any concerning symptoms with instructions to call for help. Patient was given instructions on fall risk and not to get out of bed. All questions and concerns addressed with instructions to call with any issues or inadequate analgesia.  Reason for block:procedure for pain

## 2019-05-03 NOTE — Progress Notes (Signed)
Pitocin decreased per verbal order of Colman Cater CNM

## 2019-05-03 NOTE — Progress Notes (Signed)
Labor Progress Note [name] is a 24 y.o. G2P1001 at [redacted]w[redacted]d presented for IOL for CHTN  S:  Called to bedside d/t pt feeling pressure and urge to push. Requesting her sister at bedside during exam.   O:  BP 124/86   Pulse (!) 109   Temp 98.5 F (36.9 C) (Oral)   Resp 20   Ht 5\' 2"  (1.575 m)   Wt 115.4 kg   LMP 07/30/2018 (LMP Unknown)   SpO2 99%   BMI 46.53 kg/m  EFM: baseline 140 bpm/ mod variability/ no accels/ early decels  Toco/IUPC: 2-4 SVE: Dilation: (patient does not tolerate ) Effacement (%): 70 Cervical Position: Posterior Station: -2 Presentation: Vertex Exam by:: Colman Cater CNM Pitocin: 12 mu/min  A/P: 24 y.o. G2P1001 [redacted]w[redacted]d  1. Labor: latent 2. FWB: Cat I 3. Pain: epidural  Pt consents to cervical check, unable to assess d/t pt intolerance (slid up to top of bed). Will request epidural dose up and offer Ativan to facilitate tolerance of cervical exam.   Julianne Handler, CNM 11:12 AM

## 2019-05-03 NOTE — Progress Notes (Signed)
NICU here to evaluate infant

## 2019-05-03 NOTE — Progress Notes (Signed)
Epidural redosed per anesthesia

## 2019-05-03 NOTE — Progress Notes (Addendum)
Pt tossing herself in bed, then turning tro side and clutching siderail.  Moaning loudly.  Providers attempting to talk calming to pt but pt does not verbally reposnd to providers Sister not in room.  Was able to have pt call sister pt came in at approximately 1110 and helped to calm pt.  CNM attempted a vaginal exam at 66 with pt's consent but was unable to assess cervix

## 2019-05-03 NOTE — H&P (Signed)
OBSTETRIC ADMISSION HISTORY AND PHYSICAL  Leslie Skinner is a 24 y.o. female G2P1001 with IUP at [redacted]w[redacted]d presenting for IOL 2/2 cHTN. She reports +FMs. No LOF, VB, blurry vision, headaches, peripheral edema, or RUQ pain. She plans on breastfeeding. She requests IUD for birth control.  Dating: By LMP --->  Estimated Date of Delivery: 05/06/19  Sono:   @[redacted]w[redacted]d , normal anatomy, cephalic presentation, [id number], 86%ile, EFW 8#1  Prenatal History/Complications: cHTN- no meds, on aspirin Pre-diabetes Obesity  Past Medical History: Past Medical History:  Diagnosis Date  . Acanthosis nigricans, acquired   . Constipation   . Diabetes mellitus type II    Young age around 60 per pt  . GERD (gastroesophageal reflux disease)   . Goiter   . Hypertension   . Migraines   . Obesity   . Osteochondroma of lower leg   . Pollen allergies   . Pregnancy induced hypertension   . Scoliosis   . Spinal headache     Past Surgical History: Past Surgical History:  Procedure Laterality Date  . LOWER LEG SOFT TISSUE TUMOR EXCISION  Skinner  . OSTEOCHONDROMA EXCISION      Obstetrical History: OB History    Gravida  2   Para  1   Term  1   Preterm      AB      Living  1     SAB      TAB      Ectopic      Multiple  0   Live Births  1           Social History: Social History   Socioeconomic History  . Marital status: Single    Spouse name: Not on file  . Number of children: 1  . Years of education: 12th  . Highest education level: High school graduate  Occupational History  . Not on file  Tobacco Use  . Smoking status: Passive Smoke Exposure - Never Smoker  . Smokeless tobacco: Never Used  Substance and Sexual Activity  . Alcohol use: No  . Drug use: No  . Sexual activity: Yes    Birth control/protection: None  Other Topics Concern  . Not on file  Social History Narrative   Lives with Mom Leslie Skinner), sister Leslie Skinner 1993), and nephew (Leslie Skinner's son, Leslie  Little Skinner).   12th grade at [employer] did not graduate- will finish fall 2014.      Patient is living with her sisters Leslie Skinner and Leslie Skinner) as of May 2020. She has her one year old son Leslie Skinner. Patient reported she has been do depressed/down that she went to live with siblings. She really only talk to her one year son, which he is really not talking. Leslie Skinner, Leslie Skinner 12/15/18   Social Determinants of Health   Financial Resource Strain:   . Difficulty of Paying Living Expenses: Not on file  Food Insecurity:   . Worried About Charity fundraiser in the Last Year: Not on file  . Ran Out of Food in the Last Year: Not on file  Transportation Needs:   . Lack of Transportation (Medical): Not on file  . Lack of Transportation (Non-Medical): Not on file  Physical Activity: Inactive  . Days of Exercise per Week: 0 days  . Minutes of Exercise per Session: 0 min  Stress: Stress Concern Present  . Feeling of Stress : Very much  Social Connections: Somewhat Isolated  . Frequency of  Communication with Friends and Family: More than three times a week  . Frequency of Social Gatherings with Friends and Family: More than three times a week  . Attends Religious Services: 1 to 4 times per year  . Active Member of Clubs or Organizations: No  . Attends Archivist Meetings: Never  . Marital Status: Never married    Family History: Family History  Problem Relation Age of Onset  . Diabetes Mother   . Ulcers Mother   . Thyroid disease Mother   . Cancer Mother   . Obesity Mother   . Heart disease Mother   . Heart attack Mother        caused death   . Diabetes Maternal Grandmother   . Stroke Maternal Grandmother   . Ulcers Maternal Grandmother   . Diabetes Maternal Grandfather   . GER disease Maternal Grandfather   . Heart disease Maternal Grandfather   . GER disease Sister   . Diabetes Father   . GER disease Father   . Obesity Sister   . Diabetes Paternal  Grandmother   . Thyroid disease Paternal Grandmother   . Diabetes Paternal Grandfather   . Heart disease Maternal Aunt   . Diabetes Maternal Aunt   . Thyroid disease Maternal Aunt   . Cancer Maternal Aunt   . Obesity Maternal Aunt   . GER disease Maternal Aunt     Allergies: Allergies  Allergen Reactions  . Morphine And Related Other (See Comments)    Hot flashes, can't breathe  . Tramadol Other (See Comments)    Hot flashes     Medications Prior to Admission  Medication Sig Dispense Refill Last Dose  . acetaminophen (TYLENOL) 500 MG tablet Take 1 tablet (500 mg total) by mouth every 6 (six) hours as needed. 30 tablet 0   . aspirin 81 MG chewable tablet Chew 1 tablet (81 mg total) by mouth daily. 30 tablet 2   . Blood Pressure Monitoring (BLOOD PRESSURE MONITOR AUTOMAT) DEVI 1 Device by Does not apply route daily. Automatic Blood pressure monitor with regular/large cuff. To monitor blood pressure at home on regular bases. ICD-10 Code: O09.90. 1 each 0   . cyclobenzaprine (FLEXERIL) 10 MG tablet Take 1 tablet (10 mg total) by mouth every 8 (eight) hours as needed for muscle spasms. 30 tablet 1   . Elastic Bandages & Supports (COMFORT FIT MATERNITY SUPP LG) MISC 1 Units by Does not apply route daily. 1 each 0   . famotidine (PEPCID) 40 MG tablet Take 1 tablet (40 mg total) by mouth at bedtime. (Patient not taking: Reported on 04/29/2019) 30 tablet 3   . ferrous sulfate 325 (65 FE) MG tablet Take 1 tablet (325 mg total) by mouth every other day. (Patient not taking: Reported on 04/29/2019) 15 tablet 3   . loperamide (IMODIUM A-D) 2 MG tablet Take 1 tablet (2 mg total) by mouth 4 (four) times daily as needed for diarrhea or loose stools. (Patient not taking: Reported on 04/21/2019) 30 tablet 0   . Prenatal Vit-Fe Fumarate-FA (PRENATAL COMPLETE) 14-0.4 MG TABS Take 1 tablet by mouth daily. 30 tablet 12   . sertraline (ZOLOFT) 50 MG tablet Take 1 tablet (50 mg total) by mouth daily. (Patient not  taking: Reported on 04/29/2019) 30 tablet 6      Review of Systems:  All systems reviewed and negative except as stated in HPI  PE: Blood pressure 118/61, pulse 88, temperature 98.7 F (37.1 C), temperature source  Oral, resp. rate 16, height 5\' 2"  (1.575 m), weight 115.4 kg, last menstrual period 07/30/2018, unknown if currently breastfeeding. General appearance: alert and cooperative Lungs: regular rate and effort Heart: regular rate  Abdomen: soft, non-tender Extremities: Homans sign is negative, no sign of DVT Presentation: cephalic EFM: [id number] bpm, moderate variability, 15x15 accels, no decels Toco: uterine irritibility Dilation: Closed Effacement (%): Thick Station: -3 Exam by:: Dr.Erich Kochan   Prenatal labs: ABO, Rh: --/--/A POS, A POS Performed at [facility], [place] [address], [place], [place] [address]  [phone]) Antibody: NEG (02/20 0045) Rubella: 3.99 (10/05 0859) RPR: Non Reactive (11/30 0843)  HBsAg: Negative (10/05 0859)  HIV: Non Reactive (11/30 0843)  GBS: Negative/-- (01/29 1129)  2 hr GTT 82/165/130  Prenatal Transfer Tool  Maternal Diabetes: No Genetic Screening: Normal Maternal Ultrasounds/Referrals: Normal Fetal Ultrasounds or other Referrals:  Referred to Materal Fetal Medicine  Maternal Substance Abuse:  No Significant Maternal Medications:  Aspirin Significant Maternal Lab Results: Group B Strep negative  Results for orders placed or performed during the hospital encounter of 05/02/19 (from the past 24 hour(s))  SARS CORONAVIRUS 2 (TAT 6-24 HRS) Nasopharyngeal Nasopharyngeal Swab   Collection Time: 05/02/19 12:23 AM   Specimen: Nasopharyngeal Swab  Result Value Ref Range   SARS Coronavirus 2 NEGATIVE NEGATIVE  Type and screen   Collection Time: 05/02/19 12:45 AM  Result Value Ref Range   ABO/RH(D) A POS    Antibody Screen NEG    Sample Expiration      05/05/2019,2359 Performed at [facility], [place] [address], [place],  [place] [address]   ABO/Rh   Collection Time: 05/02/19 12:45 AM  Result Value Ref Range   ABO/RH(D)      A POS Performed at [place] [address] [place], [place] [address]   CBC   Collection Time: 05/02/19  1:00 AM  Result Value Ref Range   WBC 12.0 (H) 4.0 - 10.5 K/uL   RBC 4.19 3.87 - 5.11 MIL/uL   Hemoglobin 10.0 (L) 12.0 - 15.0 g/dL   HCT 31.2 (L) 36.0 - 46.0 %   MCV 74.5 (L) 80.0 - 100.0 fL   MCH 23.9 (L) 26.0 - 34.0 pg   MCHC 32.1 30.0 - 36.0 g/dL   RDW 16.7 (H) 11.5 - 15.5 %   Platelets 269 150 - 400 K/uL   nRBC 0.2 0.0 - 0.2 %  Comprehensive metabolic panel   Collection Time: 05/02/19  1:00 AM  Result Value Ref Range   Sodium 135 135 - 145 mmol/L   Potassium 3.2 (L) 3.5 - 5.1 mmol/L   Chloride 104 98 - 111 mmol/L   CO2 22 22 - 32 mmol/L   Glucose, Bld 124 (H) 70 - 99 mg/dL   BUN 6 6 - 20 mg/dL   Creatinine, Ser 0.75 0.44 - 1.00 mg/dL   Calcium 8.6 (L) 8.9 - 10.3 mg/dL   Total Protein 6.1 (L) 6.5 - 8.1 g/dL   Albumin 2.5 (L) 3.5 - 5.0 g/dL   AST 32 15 - 41 U/L   ALT 19 0 - 44 U/L   Alkaline Phosphatase 191 (H) 38 - 126 U/L   Total Bilirubin 0.6 0.3 - 1.2 mg/dL   GFR calc non Af Amer >60 >60 mL/min   GFR calc Af Amer >60 >60 mL/min   Anion gap 9 5 - 15    Patient Active Problem List   Diagnosis Date Noted  . Supervision  of high risk pregnancy, antepartum 12/15/2018  . Chronic bilateral thoracic back pain 08/05/2018  . Current mild episode of major depressive disorder ([place]) 11/14/2017  . Pyelonephritis affecting pregnancy 10/07/2017  . Anxiety state, unspecified 06/12/2012  . Pre-diabetes 06/12/2012  . Obesity, Class III, BMI 40-49.9 (morbid obesity) ([place]) 08/23/2011  . Scoliosis 11/22/2010  . Migraine 11/08/2010  . GERD (gastroesophageal reflux disease)   . Chronic hypertension during pregnancy, antepartum 05/16/2010    Assessment: DESLYN SCHMELTER is a 24 y.o. G2P1001 at [redacted]w[redacted]d here for IOL 2/2 cHTN  1. Labor: start with cytotec. Consider FB  with next check. 2. FWB: Cat I, EFW 9# 3. Pain: per patient request 4. GBS: negative 5. Contraception: pp IUD, consented and ordered   Plan: Risks and benefits of induction were reviewed, including failure of method, prolonged labor, need for further intervention, risk of cesarean.  Patient and family seem to understand these risks and wish to proceed. Options of cytotec, foley bulb, AROM, and pitocin reviewed, with use of each discussed.   Kylia Grajales L Landan Fedie, DO  05/02/2019, 12:49 AM

## 2019-05-03 NOTE — Progress Notes (Signed)
Labor Progress Note Leslie Skinner is a 24 y.o. G2P1001 at [redacted]w[redacted]d presented for IOL for CHTN  S:  Comfortable with epidural, not feeling ctx.  O:  BP (!) 143/85   Pulse (!) 103   Temp 98.5 F (36.9 C) (Oral)   Resp 18   Ht 5\' 2"  (1.575 m)   Wt 115.4 kg   LMP 07/30/2018 (LMP Unknown)   SpO2 99%   BMI 46.53 kg/m  EFM: baseline 140 bpm/ mod variability/ + accels/ no decels  Toco: 2-4 SVE: attempted, indeterminable (previously 3/70/-2) Pitocin: 6 mu/min  A/P: 24 y.o. G2P1001 [redacted]w[redacted]d  1. Labor: latent 2. FWB: Cat I 3. Pain: epidural 4. CHTN: stable  Pt consented and agrees to vaginal exam and possible AROM, hx of aversion and intolerance of exams since admission. Pt tearful and pulling away with attempt of cervical check, states "it hurts" while examiners fingers were still external. Was only able to palpate just inside introitus and unable to reach cervix. Pt denies previous hx of exam intolerance but reports painful delivery with last baby despite epidural. Pt in unable to feel ctx therefore she has an effective regional block. No complaints regarding indwelling foley. Unclear why pt perceives vaginal exam as painful. Discussed with pt IOL may be prolonged if cannot assess cervical progression and further augment labor when necessary. Informed she can let us know if she wants to attempt another exam at some point and can offer anxiolytic prior. Will continue Pitocin titration. Will likely need increased epidural block before delivery. Anticipate labor progress and SVD.  Julianne Handler, CNM 8:54 AM

## 2019-05-03 NOTE — Progress Notes (Signed)
Pt no longer tossing side to side in bed, pt still moaning with uc but not as laudly.  Sister no longer in room

## 2019-05-03 NOTE — Anesthesia Preprocedure Evaluation (Signed)
Anesthesia Evaluation  Patient identified by MRN, date of birth, ID band Patient awake    Reviewed: Allergy & Precautions, Patient's Chart, lab work & pertinent test results  History of Anesthesia Complications (+) POST - OP SPINAL HEADACHE and history of anesthetic complications  Airway Mallampati: III  TM Distance: >3 FB Neck ROM: Full    Dental no notable dental hx. (+) Teeth Intact, Dental Advisory Given   Pulmonary neg pulmonary ROS,    Pulmonary exam normal breath sounds clear to auscultation       Cardiovascular hypertension, Normal cardiovascular exam Rhythm:Regular Rate:Normal     Neuro/Psych  Headaches, PSYCHIATRIC DISORDERS Anxiety Depression    GI/Hepatic Neg liver ROS, GERD  Medicated and Controlled,  Endo/Other  diabetes, Type 2Morbid obesityBMI 84  Renal/GU negative Renal ROS  negative genitourinary   Musculoskeletal Scoliosis  Chronic LBP   Abdominal (+) + obese,   Peds  Hematology negative hematology ROS (+) plt 246, hct 31.8   Anesthesia Other Findings IOL for chronic HTN and DM  Reproductive/Obstetrics (+) Pregnancy                             Lab Results  Component Value Date   WBC 10.4 05/02/2019   HGB 10.1 (L) 05/02/2019   HCT 32.2 (L) 05/02/2019   MCV 74.5 (L) 05/02/2019   PLT 242 05/02/2019    Anesthesia Physical  Anesthesia Plan  ASA: III and emergent  Anesthesia Plan: Epidural   Post-op Pain Management:    Induction:   PONV Risk Score and Plan:   Airway Management Planned:   Additional Equipment:   Intra-op Plan:   Post-operative Plan:   Informed Consent: I have reviewed the patients History and Physical, chart, labs and discussed the procedure including the risks, benefits and alternatives for the proposed anesthesia with the patient or authorized representative who has indicated his/her understanding and acceptance.       Plan  Discussed with:   Anesthesia Plan Comments:         Anesthesia Quick Evaluation

## 2019-05-03 NOTE — Progress Notes (Signed)
LABOR PROGRESS NOTE  Leslie Skinner is a 24 y.o. G2P1001 at [redacted]w[redacted]d  admitted for IOL cHTN (no meds) and preDM.  Subjective: Sleeping on entry to room Once awake endorsing feeling contractions  Objective: BP (!) 114/45   Pulse 84   Temp 98 F (36.7 C) (Oral)   Resp 16   Ht 5\' 2"  (1.575 m)   Wt 115.4 kg   LMP 07/30/2018 (LMP Unknown)   SpO2 99%   BMI 46.53 kg/m  or  Vitals:   05/03/19 0010 05/03/19 0030 05/03/19 0100 05/03/19 0130  BP: 133/67 127/62 127/67 (!) 114/45  Pulse: 79 87 98 84  Resp: 16 16 16    Temp:      TempSrc:      SpO2: 99%     Weight:      Height:         Dilation: 3 Effacement (%): 70 Cervical Position: Posterior Station: -2 Presentation: Vertex Exam by:: Lenise Herald RN FHT: baseline rate 145, moderate varibility, +acel, -decel Toco: q2 min  Labs: Lab Results  Component Value Date   WBC 10.4 05/02/2019   HGB 10.1 (L) 05/02/2019   HCT 32.2 (L) 05/02/2019   MCV 74.5 (L) 05/02/2019   PLT 242 05/02/2019    Patient Active Problem List   Diagnosis Date Noted  . Supervision of high risk pregnancy, antepartum 12/15/2018  . Chronic bilateral thoracic back pain 08/05/2018  . Current mild episode of major depressive disorder ([place]) 11/14/2017  . Pyelonephritis affecting pregnancy 10/07/2017  . Anxiety state 06/12/2012  . Pre-diabetes 06/12/2012  . Obesity, Class III, BMI 40-49.9 (morbid obesity) ([place]) 08/23/2011  . Scoliosis 11/22/2010  . Migraine 11/08/2010  . GERD (gastroesophageal reflux disease)   . Chronic hypertension during pregnancy, antepartum 05/16/2010    Assessment / Plan: 24 y.o. G2P1001 at [redacted]w[redacted]d here for IOL cHTN (no meds) and preDM.  Labor: s/p miso x5 and pit started at 2143. Now w epidural but continues to have significant trouble with cervical exams. After coaching able to get adequate exam, cervix is no favorable at 3/70/-2, cont pitocin and limit exams unless warranted by patient symptoms or FHT.   Fetal Wellbeing:  Cat I  currently, had been having intermittent lates but resolved with reducing pitocin 8>4 Pain Control:  epidural GBS: neg Anticipated MOD:  SVD  cHTN: normotensive to mild range, ctm  PreDM: CBG 102 this AM, not checking routinely   Augustin Coupe, MD/MPH OB Fellow  05/03/2019, 2:07 AM

## 2019-05-03 NOTE — Progress Notes (Signed)
Sister returned to room

## 2019-05-03 NOTE — Progress Notes (Signed)
NICU called infant O2 sat 87 %. Request NICU come and evaluate infant

## 2019-05-03 NOTE — Progress Notes (Signed)
Labor Progress Note ALANEA NEILSON is a 24 y.o. G2P1001 at [redacted]w[redacted]d presented for IOL for CHTN  S:  Comfortable with epidural, actively pushing x1 hr.  O:  BP (!) 124/111   Pulse (!) 116   Temp 98.9 F (37.2 C) (Oral)   Resp 15   Ht 5\' 2"  (1.575 m)   Wt 115.4 kg   LMP 07/30/2018 (LMP Unknown)   SpO2 99%   BMI 46.53 kg/m  EFM: baseline 140 bpm/ mod variability/ no accels/ late decels  Toco/IUPC: 1.5-2 SVE: Dilation: 10 Dilation Complete Date: 05/03/19 Dilation Complete Time: 1149 Effacement (%): 100 Cervical Position: Posterior Station: 0, Plus 1 Presentation: Vertex Exam by:: Colman Cater CNM Pitocin: 12 mu/min  A/P: 24 y.o. G2P1001 [redacted]w[redacted]d  1. Labor: second stage 2. FWB: Cat II 3. Pain: epidural  Recurrent variable and late decels, better fetal tolerance while positioned on maternal right side. Minimal maternal effort with pushing appreciated with palpation, although pt can feel perineal pressure. Will continue active coached pushing and decrease Pitocin. Anticipate SVD. Dr. Kennon Rounds updated.   Julianne Handler, CNM 12:57 PM

## 2019-05-03 NOTE — Procedures (Signed)
Post-Placental IUD Insertion Procedure Note  Patient identified, informed consent signed prior to delivery, signed copy in chart, time out was performed.    Liletta IUD loaded into inserter. IUD inserter grasped between sterile gloved fingers. IUD inserter advanced to fundus and resistance detected. IUD inserter pulled back approx 1 cm and IUD released into uterus. Strings trimmed to the level of the introitus. Patient tolerated procedure well.  Lot # [id number] Expiration Date 07/30/22  Patient given post procedure instructions and IUD care card with expiration date.  Patient is asked to keep IUD strings tucked in her vagina until her postpartum follow up visit in 4-6 weeks. Patient advised to abstain from sexual intercourse and pulling on strings before her follow-up visit. Patient verbalized an understanding of the plan of care and agrees.

## 2019-05-03 NOTE — Progress Notes (Signed)
LABOR PROGRESS NOTE  Leslie Skinner is a 24 y.o. G2P1001 at [redacted]w[redacted]d  admitted for IOL cHTN (no meds) and preDM.  Subjective: Sleeping  Objective: BP (!) 109/48   Pulse 81   Temp 97.8 F (36.6 C) (Axillary)   Resp 16   Ht 5\' 2"  (1.575 m)   Wt 115.4 kg   LMP 07/30/2018 (LMP Unknown)   SpO2 99%   BMI 46.53 kg/m  or  Vitals:   05/03/19 0530 05/03/19 0600 05/03/19 0630 05/03/19 0700  BP: 110/65 119/61 (!) 114/56 (!) 109/48  Pulse: 90 89 86 81  Resp: 16 16 16 16   Temp:      TempSrc:      SpO2:      Weight:      Height:         Dilation: 3 Effacement (%): 70 Cervical Position: Posterior Station: -2 Presentation: Vertex Exam by:: Zamyah Wiesman MD FHT: baseline rate 140, moderate varibility, +acel, -decel Toco: q2-3 min  Labs: Lab Results  Component Value Date   WBC 10.4 05/02/2019   HGB 10.1 (L) 05/02/2019   HCT 32.2 (L) 05/02/2019   MCV 74.5 (L) 05/02/2019   PLT 242 05/02/2019    Patient Active Problem List   Diagnosis Date Noted  . Supervision of high risk pregnancy, antepartum 12/15/2018  . Chronic bilateral thoracic back pain 08/05/2018  . Current mild episode of major depressive disorder ([place]) 11/14/2017  . Pyelonephritis affecting pregnancy 10/07/2017  . Anxiety state 06/12/2012  . Pre-diabetes 06/12/2012  . Obesity, Class III, BMI 40-49.9 (morbid obesity) ([place]) 08/23/2011  . Scoliosis 11/22/2010  . Migraine 11/08/2010  . GERD (gastroesophageal reflux disease)   . Chronic hypertension during pregnancy, antepartum 05/16/2010    Assessment / Plan: 24 y.o. G2P1001 at [redacted]w[redacted]d here for IOL cHTN (no meds) and preDM.  Labor: s/p miso x5 and pit started at 2143. Cervix favorable at 3/70/-2 at last exam at 0200, cont pitocin and limit exams unless warranted by patient symptoms or FHT. Consider AROM in next four hours if no progress.   Fetal Wellbeing:  Cat I Pain Control:  epidural GBS: neg Anticipated MOD:  SVD  cHTN: normotensive to mild range, ctm  PreDM:  CBG 102 AM of admission, not checking routinely   Augustin Coupe, MD/MP[facility] OB Fellow  05/03/2019, 7:05 AM

## 2019-05-03 NOTE — Discharge Summary (Signed)
Postpartum Discharge Summary     Patient Name: Leslie Skinner DOB: 1995/10/02 MRN: [id number]  Date of admission: 05/02/2019 Delivering Provider: Julianne Handler   Date of discharge: 05/04/2019  Admitting diagnosis: Chronic hypertension during pregnancy, antepartum [O10.919] Intrauterine pregnancy: [redacted]w[redacted]d    Secondary diagnosis:  Active Problems:   Chronic hypertension during pregnancy, antepartum   Obesity, Class III, BMI 40-49.9 (morbid obesity) ([place]   Anxiety state   Pre-diabetes   Chronic bilateral thoracic back pain   Supervision of high risk pregnancy, antepartum   SVD (spontaneous vaginal delivery)  Additional problems: None     Discharge diagnosis: Term Pregnancy Delivered and CHTN                                                                                                Post partum procedures:IUD placed after delivery  Augmentation: Pitocin and Cytotec  Complications: None  Hospital course:  Induction of Labor With Vaginal Delivery   24y.o. yo G2P2001 at 324w4das admitted to the hospital 05/02/2019 for induction of labor.  Indication for induction: CHTN.  Patient had an uncomplicated labor course as follows: Membrane Rupture Time/Date: 12:15 PM ,05/03/2019   Intrapartum Procedures: Episiotomy: None [1]                                         Lacerations:  None [1]  Patient had delivery of a Viable infant.  Information for the patient's newborn:  BeAdhira, Jamil0[[id number]]Delivery Method: Vaginal, Spontaneous(Filed from Delivery Summary)    05/03/2019  Details of delivery can be found in separate delivery note.  Patient had a routine postpartum course. She was noted to have elevated blood pressures and started on Vasotec; had not been on medications during pregnancy for cHTN. Ideally, patient would have stayed for another day, but infant in NICU and found to have tetralogy of fallot and needed to be transferred to another hospital and therefore mom  requested early discharge. She was also restarted on Zoloft for anxiety/depression and BH f/u requested in clinic. IUD placed post-placentally. Patient is discharged home 05/04/19. Delivery time: 2:07 PM    Magnesium Sulfate received: No BMZ received: No Rhophylac:N/A MMR:N/A Transfusion:No  Physical exam  Vitals:   05/03/19 1757 05/03/19 2200 05/04/19 0200 05/04/19 0600  BP: (!) 148/91 (!) 147/81 (!) 143/83 (!) 145/93  Pulse: 84 98 68 80  Resp: '18 18 18 18  ' Temp: 98.8 F (37.1 C) 98.7 F (37.1 C) 97.9 F (36.6 C) 98.2 F (36.8 C)  TempSrc: Oral Oral Oral Oral  SpO2:  100% 100% 100%  Weight:      Height:       General: alert, cooperative and no distress Lochia: appropriate Uterine Fundus: firm Incision: N/A DVT Evaluation: No evidence of DVT seen on physical exam. Labs: Lab Results  Component Value Date   WBC 17.5 (H) 05/04/2019   HGB 9.5 (L) 05/04/2019   HCT 30.8 (L) 05/04/2019   MCV 75.3 (L) 05/04/2019  PLT 231 05/04/2019   CMP Latest Ref Rng & Units 05/02/2019  Glucose 70 - 99 mg/dL 124(H)  BUN 6 - 20 mg/dL 6  Creatinine 0.44 - 1.00 mg/dL 0.75  Sodium 135 - 145 mmol/L 135  Potassium 3.5 - 5.1 mmol/L 3.2(L)  Chloride 98 - 111 mmol/L 104  CO2 22 - 32 mmol/L 22  Calcium 8.9 - 10.3 mg/dL 8.6(L)  Total Protein 6.5 - 8.1 g/dL 6.1(L)  Total Bilirubin 0.3 - 1.2 mg/dL 0.6  Alkaline Phos 38 - 126 U/L 191(H)  AST 15 - 41 U/L 32  ALT 0 - 44 U/L 19   Edinburgh Score: Edinburgh Postnatal Depression Scale Screening Tool 05/04/2019  I have been able to laugh and see the funny side of things. 3  I have looked forward with enjoyment to things. 3  I have blamed myself unnecessarily when things went wrong. 3  I have been anxious or worried for no good reason. 3  I have felt scared or panicky for no good reason. 2  Things have been getting on top of me. 3  I have been so unhappy that I have had difficulty sleeping. 2  I have felt sad or miserable. 2  I have been so  unhappy that I have been crying. 1  The thought of harming myself has occurred to me. 0  Edinburgh Postnatal Depression Scale Total 22    Discharge instruction: per After Visit Summary and "Baby and Me Booklet".  After visit meds:  Allergies as of 05/04/2019      Reactions   Morphine And Related Other (See Comments)   Hot flashes, can't breathe   Tramadol Other (See Comments)   Hot flashes       Medication List    STOP taking these medications   aspirin 81 MG chewable tablet   famotidine 40 MG tablet Commonly known as: Pepcid   ferrous sulfate 325 (65 FE) MG tablet   loperamide 2 MG tablet Commonly known as: Imodium A-D     TAKE these medications   acetaminophen 325 MG tablet Commonly known as: Tylenol Take 2 tablets (650 mg total) by mouth every 6 (six) hours as needed (for pain scale < 4). What changed:   medication strength  how much to take  reasons to take this   Blood Pressure Monitor Automat Devi 1 Device by Does not apply route daily. Automatic Blood pressure monitor with regular/large cuff. To monitor blood pressure at home on regular bases. ICD-10 Code: O09.90.   Comfort Fit Maternity Supp Lg Misc 1 Units by Does not apply route daily.   cyclobenzaprine 10 MG tablet Commonly known as: FLEXERIL Take 1 tablet (10 mg total) by mouth every 8 (eight) hours as needed for muscle spasms.   enalapril 5 MG tablet Commonly known as: VASOTEC Take 1 tablet (5 mg total) by mouth daily. Start taking on: May 05, 2019   ibuprofen 600 MG tablet Commonly known as: ADVIL Take 1 tablet (600 mg total) by mouth every 6 (six) hours.   Prenatal Complete 14-0.4 MG Tabs Take 1 tablet by mouth daily.   senna-docusate 8.6-50 MG tablet Commonly known as: Senokot-S Take 2 tablets by mouth daily. Start taking on: May 05, 2019   sertraline 50 MG tablet Commonly known as: Zoloft Take 1 tablet (50 mg total) by mouth daily.       Diet: routine  diet  Activity: Advance as tolerated. Pelvic rest for 6 weeks.   Outpatient follow up:4 weeks  Follow up Appt: Future Appointments  Date Time Provider [place]  05/13/2019  1:15 PM Lynnea Ferrier, LCSW [facility] None  06/18/2019  9:50 AM Laury Deep, CNM [facility] None   Follow up Visit:  Please schedule this patient for Postpartum visit in: 6 weeks with the following provider: Any provider In-Person For C/S patients schedule nurse incision check in weeks 2 weeks: no High risk pregnancy complicated by: HTN Delivery mode:  SVD Anticipated Birth Control:  PP IUD Placed PP Procedures needed: BP check  Schedule Integrated BH visit: yes  Newborn Data: Live born female  Birth Weight: 3225g APGAR (1 MIN): 8   APGAR (5 MINS): 5   APGAR (10 MINS): 9    Newborn Delivery   Birth date/time: 05/03/2019 14:07:00 Delivery type:       Baby Feeding: Breast Disposition:NICU; transferring    05/04/2019 Chauncey Mann, MD

## 2019-05-04 LAB — CBC
HCT: 30.8 % — ABNORMAL LOW (ref 36.0–46.0)
Hemoglobin: 9.5 g/dL — ABNORMAL LOW (ref 12.0–15.0)
MCH: 23.2 pg — ABNORMAL LOW (ref 26.0–34.0)
MCHC: 30.8 g/dL (ref 30.0–36.0)
MCV: 75.3 fL — ABNORMAL LOW (ref 80.0–100.0)
Platelets: 231 10*3/uL (ref 150–400)
RBC: 4.09 MIL/uL (ref 3.87–5.11)
RDW: 16.8 % — ABNORMAL HIGH (ref 11.5–15.5)
WBC: 17.5 10*3/uL — ABNORMAL HIGH (ref 4.0–10.5)
nRBC: 0 % (ref 0.0–0.2)

## 2019-05-04 MED ORDER — IBUPROFEN 600 MG PO TABS: 600.0000 mg | ORAL_TABLET | Freq: Four times a day (QID) | ORAL | 0 refills | Status: DC

## 2019-05-04 MED ORDER — SERTRALINE HCL 50 MG PO TABS
50.0000 mg | ORAL_TABLET | Freq: Every day | ORAL | Status: DC
  Administered 2019-05-04: 50 mg via ORAL
  Filled 2019-05-04: qty 1

## 2019-05-04 MED ORDER — ENALAPRIL MALEATE 5 MG PO TABS: 5.0000 mg | ORAL_TABLET | Freq: Every day | ORAL | 0 refills | Status: DC

## 2019-05-04 MED ORDER — ACETAMINOPHEN 325 MG PO TABS: 650.0000 mg | ORAL_TABLET | Freq: Four times a day (QID) | ORAL | 0 refills | Status: DC | PRN

## 2019-05-04 MED ORDER — SERTRALINE HCL 50 MG PO TABS: 50.0000 mg | ORAL_TABLET | Freq: Every day | ORAL | 0 refills | Status: DC

## 2019-05-04 MED ORDER — ENALAPRIL MALEATE 5 MG PO TABS
5.0000 mg | ORAL_TABLET | Freq: Every day | ORAL | Status: DC
  Administered 2019-05-04: 5 mg via ORAL
  Filled 2019-05-04: qty 1

## 2019-05-04 MED ORDER — OXYCODONE HCL 5 MG PO TABS
5.0000 mg | ORAL_TABLET | ORAL | Status: DC | PRN
  Administered 2019-05-04 (×3): 5 mg via ORAL
  Filled 2019-05-04 (×3): qty 1

## 2019-05-04 MED ORDER — SENNOSIDES-DOCUSATE SODIUM 8.6-50 MG PO TABS: 2.0000 | ORAL_TABLET | ORAL | 0 refills | Status: DC

## 2019-05-04 MED FILL — ACETAMINOPHEN 325 MG TABS: 325 | 5 days supply | Qty: 30 | Fill #0

## 2019-05-04 MED FILL — SERTRALINE HCL 50 MG TABLET: 50 | 30 days supply | Qty: 30 | Fill #0

## 2019-05-04 MED FILL — ENALAPRIL MALEATE 5 MG TABS: 5 | 30 days supply | Qty: 30 | Fill #0

## 2019-05-04 MED FILL — IBUPROFEN 600 MG TABS: 600 | 8 days supply | Qty: 30 | Fill #0

## 2019-05-04 MED FILL — SENEXON-S 8.6-50 MG TABS: 8.6-50 | 15 days supply | Qty: 30 | Fill #0

## 2019-05-04 NOTE — Progress Notes (Signed)
CSW met with MOB and MOB's sister at [facility] bedside. When CSW arrived, MOB was on the phone and MOB's sister was packing up some of MOB's items. CSW offered to return at a later time, but MOB declined. CSW offered emotional supports and resources to family and they were receptive. MOB reported having a good support team and transportation to got her to [place] informed MOB about the [employer] and MOB expressed interest. CSW completed an online application for MOB and is awaiting a response.   CSW also contacted [facility] to receive information regarding hospital visitation; information was shared with MOB.   CSW will continue to offer resources and supports to family while infant remains in NICU.    Laurey Arrow, MSW, LCSW Clinical Social Work [phone]

## 2019-05-04 NOTE — Anesthesia Postprocedure Evaluation (Signed)
Anesthesia Post Note  Patient: Leslie Skinner  Procedure(s) Performed: AN AD HOC LABOR EPIDURAL     Patient location during evaluation: Mother Baby Anesthesia Type: Epidural Level of consciousness: awake Pain management: satisfactory to patient Vital Signs Assessment: post-procedure vital signs reviewed and stable Respiratory status: spontaneous breathing Cardiovascular status: stable Anesthetic complications: no    Last Vitals:  Vitals:   05/04/19 0200 05/04/19 0600  BP: (!) 143/83 (!) 145/93  Pulse: 68 80  Resp: 18 18  Temp: 36.6 C 36.8 C  SpO2: 100% 100%    Last Pain:  Vitals:   05/04/19 0600  TempSrc: Oral  PainSc: 0-No pain   Pain Goal: Patients Stated Pain Goal: 3 (05/03/19 1758)                 Casimer Lanius

## 2019-05-04 NOTE — Clinical Social Work Maternal (Addendum)
CLINICAL SOCIAL WORK MATERNAL/CHILD NOTE  Patient Details  Name: Leslie Skinner MRN: [id number] Date of Birth: 1996-03-07  Date:  05/04/2019  Clinical Social Worker Initiating Note:  Elijio Miles Date/Time: Initiated:  05/04/19/1018     Child's Name:  Leslie Skinner   Biological Parents:  Mother, Kripa Foskey and Elijio Miles)   Need for Interpreter:  None   Reason for Referral:  Behavioral Health Concerns   Address:  [address], [place], [place] [address]    Phone number:  [phone] (home)     Additional phone number:   Household Members/Support Persons (HM/SP):   Household Member/Support Person 1   HM/SP Name Relationship DOB or Age  HM/SP -1 Etta Grandchild 10/25/2017  HM/SP -2        HM/SP -3        HM/SP -4        HM/SP -5        HM/SP -6        HM/SP -7        HM/SP -8          Natural Supports (not living in the home):  Immediate Family   Professional Supports: None   Employment: Full-time   Type of Work: Teaching laboratory technician   Education:  High school graduate   Homebound arranged:    Museum/gallery curator Resources:  Medicaid   Other Resources:  Physicist, medical , [place] Considerations Which May Impact Care:    Strengths:  Ability to meet basic needs , Home prepared for child    Psychotropic Medications:         Pediatrician:       Pediatrician List:   [facility]      Pediatrician Fax Number:    Risk Factors/Current Problems:  Mental Health Concerns    Cognitive State:  Able to Concentrate , Alert , Linear Thinking    Mood/Affect:  Calm , Comfortable , Interested , Relaxed    CSW Assessment: CSW received consult for history of anxiety, depression and PPD.  CSW met with MOB to offer support and complete assessment.    MOB resting in bed with infant asleep in bassinet and MOB's sister present at  bedside, when CSW entered the room. CSW introduced self and received verbal permission to complete assessment with MOB's sister present. MOB pleasant and easy to engage throughout assessment. CSW inquired about MOB's mental health history and MOB reported having depression all her life and stated she was diagnosed with bipolar at a younger age. MOB shared experiencing PPD after her previous child was born and reported symptoms of depression and having low interest in things. MOB stated she feels symptoms never really went away. MOB reported she met with [employer]ag[employer] Specialist at [facility], during her pregnancy and felt it was helpful. MOB also confirmed being started on Zoloft during her pregnancy but reported she never picked up her prescription and believes prescription has expired. MOB reported she is interested in being started on the Zoloft now. CSW received verbal permission to reach out to MD regarding medication and to reach out to [place] to schedule follow-up. CSW provided education regarding the baby blues period vs. perinatal mood disorders, discussed treatment and gave resources for mental health follow up if concerns arise. CSW recommended self-evaluation during the postpartum time period using the New Mom Checklist from  Postpartum Progress and encouraged MOB to contact a medical professional if symptoms are noted at any time. MOB did not appear to be displaying any acute mental health symptoms and denied any current SI, HI or DV. MOB reported feeling well-supported by her two sisters. CSW to make [facility] and [facility] referrals to offer additional support post-discharge.   MOB confirmed having all essential items for infant once discharged and stated infant would be sleeping in a bassinet once home. CSW provided review of Sudden Infant Death Syndrome (SIDS) precautions and safe sleeping habits.     CSW Plan/Description:  No Further Intervention Required/No Barriers to Discharge, Sudden Infant Death  Syndrome (SIDS) Education, Perinatal Mood and Anxiety Disorder (PMADs) Education    [place], LCSW 05/04/2019, 10:53 AM

## 2019-05-04 NOTE — Progress Notes (Signed)
  Visited with Pt and her sister Leslie Skinner as Leslie Skinner got the news from Dr Higinio Roger that her daughter will need to be transferred to [facility] for heart surgery.  Leslie Skinner was understandably emotional as she processed the information.  Chaplain assisted family in navigating information, asking questions, and making plans for the transfer.  Chaplain normalized significant grief and shock that Leslie Skinner's healthy baby was not as healthy as she thought.  Chaplain offered space for patient to name her fears and concerns as well as her grief over the loss of her mother 2 years ago, baby has her mother's middle name, and her feelings of longing for that presence in her life right now.  Chaplain did initial research about covid visitation at [facility] and followed up with LCSW, Morton Amy who met patient immediately after chaplain left.  Chaplain also prayed with patient at her request.    Please page as further needs arise.  Donald Prose. Elyn Peers, M.Div. [facility] Chaplain Pager [phone] Office [phone]     05/04/19 1608  Clinical Encounter Type  Visited With Patient and family together  Visit Type Spiritual support;Other (Comment)  Spiritual Encounters  Spiritual Needs Prayer;Emotional;Grief support

## 2019-05-04 NOTE — Progress Notes (Addendum)
POSTPARTUM PROGRESS NOTE  Subjective: Leslie Skinner is a 24 y.o. G2P2001 s/p SVD at [redacted]w[redacted]d.  She reports she doing well. No acute events overnight. She denies any problems with ambulating, voiding or po intake. Denies nausea or vomiting. Pain is moderately controlled.  Lochia is less than menses.  Objective: Blood pressure (!) 145/93, pulse 80, temperature 98.2 F (36.8 C), temperature source Oral, resp. rate 18, height 5\' 2"  (1.575 m), weight 254 lb 6.4 oz (115.4 kg), last menstrual period 07/30/2018, SpO2 100 %, unknown if currently breastfeeding.  Physical Exam:  General: alert, cooperative and no distress Chest: RRR, no respiratory distress Abdomen: soft, non-tender  Uterine Fundus: firm, appropriately tender Extremities: No calf swelling or tenderness  Recent Labs    05/02/19 2251 05/04/19 0528  HGB 10.1* 9.5*  HCT 32.2* 30.8*    Assessment/Plan: Leslie Skinner is a 23 y.o. G2P2001 s/p SVD at [redacted]w[redacted]d for IOL.  Routine Postpartum Care: Doing well, pain well-controlled.  -- Continue routine care, lactation support  -- Contraception: ppIUD -- Feeding: Breast  Dispo: Plan for discharge likely tomorrow.  Lurline Del, DO Resident, [facility]  I confirm that I have verified the information documented in the resident's note and that I have also personally reperformed the history, physical exam and all medical decision making activities of this service and have verified that all service and findings are accurately documented in this student's note.   Order placed to start patient on Enalapril 5 mg daily today.   Luvenia Redden, PA-C 05/04/2019 10:11 AM

## 2019-05-04 NOTE — Lactation Note (Signed)
This note was copied from a baby's chart. Lactation Consultation Note Baby 30 hrs old. Baby sleeping. talking w/mom about feeding and supplementing. Mom's last 2 feedings has been formula feeding. Mom stated she didn't BF her first child. Mom stated last feeding for baby was 0100 and gave formula. Mom encouraged to feed baby 8-12 times/24 hours and with feeding cues. Mom encouraged to waken baby for feedings if hasn't cued in 3 hrs. RN to assess mom and baby is being cleaned and weighted. LC told mom someone would be back soon after staff finished working with them.  Patient Name: Leslie Skinner [id number] Date: 05/04/2019 Reason for consult: Initial assessment   Maternal Data    Feeding    LATCH Score       Type of Nipple: Everted at rest and after stimulation  Comfort (Breast/Nipple): Soft / non-tender        Interventions Interventions: Breast feeding basics reviewed;Hand express  Lactation Tools Discussed/Used     Consult Status Consult Status: Follow-up Date: 05/04/19 Follow-up type: In-patient    Theodoro Kalata 05/04/2019, 6:10 AM

## 2019-05-13 ENCOUNTER — Encounter: Payer: Medicaid Other | Admitting: Licensed Clinical Social Worker

## 2019-05-14 ENCOUNTER — Telehealth: Payer: Self-pay | Admitting: Clinical

## 2019-05-14 NOTE — Telephone Encounter (Signed)
Called pt to schedule a follow up. Pt did not answer. Voicemail wasn't left due to voicemail box being full.

## 2019-05-20 ENCOUNTER — Institutional Professional Consult (permissible substitution): Payer: Medicaid Other | Admitting: Licensed Clinical Social Worker

## 2019-05-20 ENCOUNTER — Telehealth: Payer: Self-pay | Admitting: Licensed Clinical Social Worker

## 2019-05-20 NOTE — Telephone Encounter (Signed)
Called pt regarding mychart visit. Unable to leave message. Patient no show visit

## 2019-06-18 ENCOUNTER — Ambulatory Visit: Payer: Medicaid Other | Admitting: Obstetrics and Gynecology

## 2019-07-23 ENCOUNTER — Telehealth: Payer: Self-pay | Admitting: General Practice

## 2019-07-23 NOTE — Telephone Encounter (Signed)
Patient left message wanting to reschedule postpartum visit.  Returned call to schedule, but no answer.  Left message for pt to give our office a call to reschedule.

## 2019-08-05 ENCOUNTER — Ambulatory Visit: Payer: Medicaid Other | Admitting: Obstetrics and Gynecology

## 2020-01-27 ENCOUNTER — Ambulatory Visit: Payer: Medicaid Other | Admitting: Family Medicine

## 2020-01-28 ENCOUNTER — Other Ambulatory Visit (HOSPITAL_COMMUNITY)
Admission: RE | Admit: 2020-01-28 | Discharge: 2020-01-28 | Disposition: A | Discharge status: Home / Self Care | Payer: Medicaid Other | Source: Ambulatory Visit | Attending: Family Medicine | Admitting: Family Medicine

## 2020-01-28 ENCOUNTER — Ambulatory Visit (INDEPENDENT_AMBULATORY_CARE_PROVIDER_SITE_OTHER): Payer: Medicaid Other | Admitting: Family Medicine

## 2020-01-28 ENCOUNTER — Other Ambulatory Visit: Payer: Self-pay

## 2020-01-28 ENCOUNTER — Encounter: Payer: Self-pay | Admitting: Family Medicine

## 2020-01-28 VITALS — BP 100/70 | HR 96 | Ht 62.0 in | Wt 240.5 lb

## 2020-01-28 DIAGNOSIS — R350 Frequency of micturition: Secondary | ICD-10-CM | POA: Diagnosis not present

## 2020-01-28 DIAGNOSIS — B9689 Other specified bacterial agents as the cause of diseases classified elsewhere: Secondary | ICD-10-CM

## 2020-01-28 DIAGNOSIS — Z7251 High risk heterosexual behavior: Secondary | ICD-10-CM | POA: Diagnosis not present

## 2020-01-28 DIAGNOSIS — N760 Acute vaginitis: Secondary | ICD-10-CM | POA: Diagnosis not present

## 2020-01-28 DIAGNOSIS — N3000 Acute cystitis without hematuria: Secondary | ICD-10-CM | POA: Diagnosis not present

## 2020-01-28 DIAGNOSIS — Z975 Presence of (intrauterine) contraceptive device: Secondary | ICD-10-CM

## 2020-01-28 DIAGNOSIS — Z113 Encounter for screening for infections with a predominantly sexual mode of transmission: Secondary | ICD-10-CM

## 2020-01-28 HISTORY — DX: Presence of (intrauterine) contraceptive device: Z97.5

## 2020-01-28 LAB — POCT WET PREP (WET MOUNT)
Clue Cells Wet Prep Whiff POC: POSITIVE
Trichomonas Wet Prep HPF POC: ABSENT

## 2020-01-28 LAB — POCT URINALYSIS DIP (MANUAL ENTRY)
Bilirubin, UA: NEGATIVE
Glucose, UA: NEGATIVE mg/dL
Ketones, POC UA: NEGATIVE mg/dL
Nitrite, UA: POSITIVE — AB
Protein Ur, POC: 30 mg/dL — AB
Spec Grav, UA: 1.025 (ref 1.010–1.025)
Urobilinogen, UA: 0.2 E.U./dL
pH, UA: 6 (ref 5.0–8.0)

## 2020-01-28 LAB — POCT UA - MICROSCOPIC ONLY

## 2020-01-28 MED ORDER — METRONIDAZOLE 500 MG PO TABS: 500.0000 mg | ORAL_TABLET | Freq: Two times a day (BID) | ORAL | 0 refills | Status: AC

## 2020-01-28 MED ORDER — CEPHALEXIN 500 MG PO CAPS: 500.0000 mg | ORAL_CAPSULE | Freq: Four times a day (QID) | ORAL | 0 refills | Status: AC

## 2020-01-28 NOTE — Progress Notes (Signed)
   SUBJECTIVE:   CHIEF COMPLAINT / HPI:   Chief Complaint  Patient presents with  . Urinary Frequency     Leslie Skinner is a 24 y.o. female here for urinary frequency.   Dysuria: Pain urinating started 2 days ago. Patient reports frequency and  urgency, but no blood in urine. Endorses lower back and pelvic. No condom but has been sexually active with one female partner. No vaginal discharge. She has tried nothing for this. Reports no antibiotics in the last 30 days. Reports hx of frequent UTIs. Had a UTI when she was pregnant in Feb 2021.   Patient reports no exposure to an STI.  Patient reports no CVA tenderness, fevers, vaginal discharge or rashes  IUD was placed post-placental.    PERTINENT  PMH / PSH: reviewed and updated as appropriate   OBJECTIVE:   BP 100/70   Pulse 96   Ht 5\' 2"  (1.575 m)   Wt 240 lb 8 oz (109.1 kg)   SpO2 98%   BMI 43.99 kg/m    GEN: well appearing female in no acute distress  CVS: well perfused  RESP: speaking in full sentences without pause  ABD: soft, non-tender, non-distended, no palpable masses  Pelvic exam: normal external genitalia, vulva, VAGINA and CERVIX: mucoid thin discharge. WET MOUNT done - results: KOH done, clue cells, excessive bacteria, positive Whiff test, exam chaperoned by CMA.    ASSESSMENT/PLAN:   BV (bacterial vaginosis)  Confirmed on wet prep. G/C Marveen Reeks is pending. Symptoms consistent with this.  - Treatment: Flagyl 500 BID x 7 days and abstain from coitus during course of treatment. Advised patient to not drink alcohol while taking this medication.  - F/U if symptoms not improving or getting worse.  - Will f/u on G/C Chlamydia and call in Rx if positive.  - Self care instructions given including avoiding douching. Handout given.  - F/U with PCP as needed.  - Return precautions including abdominal pain, fever, chills, nausea, or vomiting given.   High risk heterosexual behavior GC and chlamydia DNA  probe sent  to lab. HIV and RPR deferred for next visit as lab was closed. Advised patient to use barrier protection/condoms.   UTI (urinary tract infection) UA consistent with pyuria. Treat with Keflex QID for 5 days. Follow up if not improving.   IUD contraception IUD in place. Strings visualized and shortened      Lyndee Hensen, DO PGY-2, [facility] 01/28/2020

## 2020-01-28 NOTE — Assessment & Plan Note (Addendum)
UA consistent with pyuria. Treat with Keflex QID for 5 days. Follow up if not improving.

## 2020-01-28 NOTE — Assessment & Plan Note (Signed)
IUD in place. Strings visualized and shortened

## 2020-01-28 NOTE — Patient Instructions (Addendum)
It was great seeing you today!  Visit Remembers: - Stop by the pharmacy to pick up your prescriptions  - Continue to work on your healthy eating habits and incorporating exercise into your daily life. - For UTI: take Cephalexin 4 times a day  - For Bacterial vaginosis: take Metronidazole twice a day. DO NOT drink alcohol while taking this medication     Regarding lab work today:  Due to recent changes in healthcare laws, you may see the results of your imaging and laboratory studies on MyChart before your provider has had a chance to review them.  I understand that in some cases there may be results that are confusing or concerning to you. Not all laboratory results come back in the same time frame and you may be waiting for multiple results in order to interpret others.  Please give [place] 72 hours in order for your provider to thoroughly review all the results before contacting the office for clarification of your results. If everything is normal, you will get a letter in the mail or a message in My Chart. Please give [place] a call if you do not hear from [place] after 2 weeks.  Please bring all of your medications with you to each visit.    If you haven't already, sign up for My Chart to have easy access to your labs results, and communication with your primary care physician.  Feel free to call with any questions or concerns at any time, at [phone].   Take care,  Dr. Rushie [facility]

## 2020-01-28 NOTE — Assessment & Plan Note (Addendum)
Confirmed on wet prep. G/C Leslie Skinner is pending. Symptoms consistent with this.  - Treatment: Flagyl 500 BID x 7 days and abstain from coitus during course of treatment. Advised patient to not drink alcohol while taking this medication.  - F/U if symptoms not improving or getting worse.  - Will f/u on G/C Chlamydia and call in Rx if positive.  - Self care instructions given including avoiding douching. Handout given.  - F/U with PCP as needed.  - Return precautions including abdominal pain, fever, chills, nausea, or vomiting given.   High risk heterosexual behavior GC and chlamydia DNA  probe sent to lab. HIV and RPR deferred for next visit as lab was closed. Advised patient to use barrier protection/condoms.

## 2020-02-01 LAB — CERVICOVAGINAL ANCILLARY ONLY
Chlamydia: NEGATIVE
Comment: NEGATIVE
Comment: NORMAL
Neisseria Gonorrhea: NEGATIVE

## 2020-03-24 IMAGING — US US FETAL BPP W/ NON-STRESS
1 series · 13 of 13 positions shown · non-contrast
Comparison: none

[Series 1: us fetal bpp w/nonstress · 13 acquisitions, 13 frames shown]
[im 1/13]
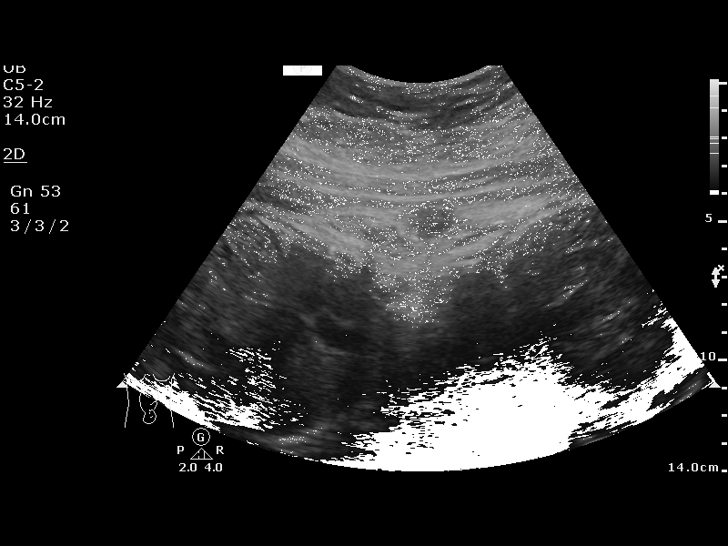
[im 2/13]
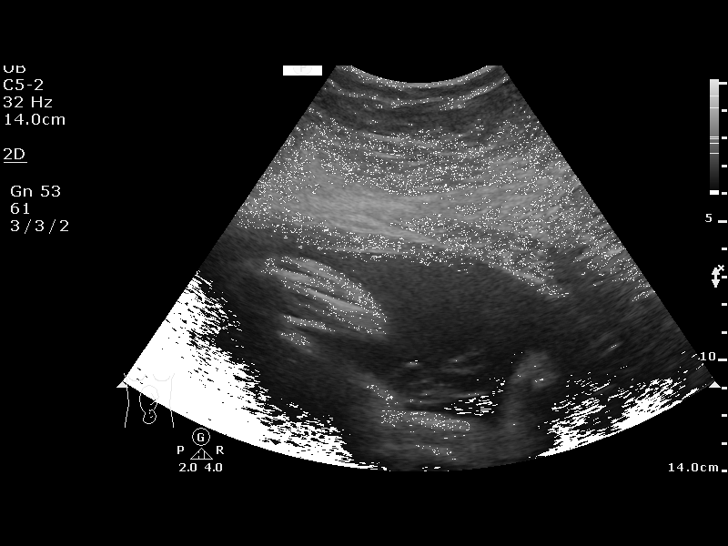
[im 3/13]
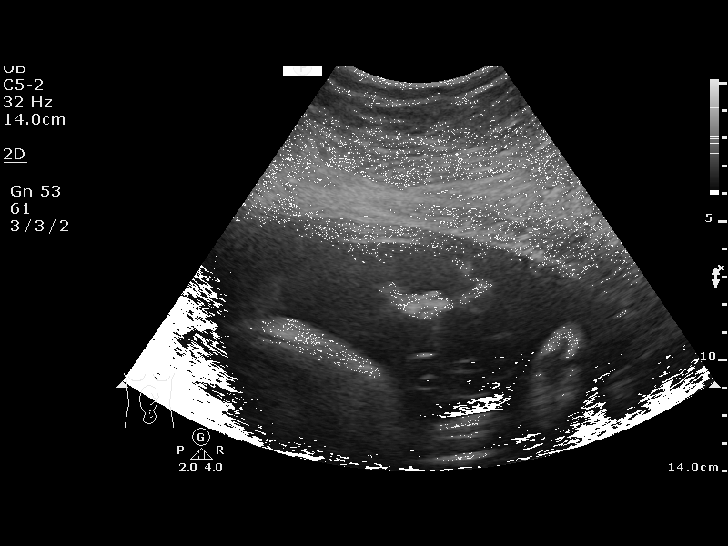
[im 4/13]
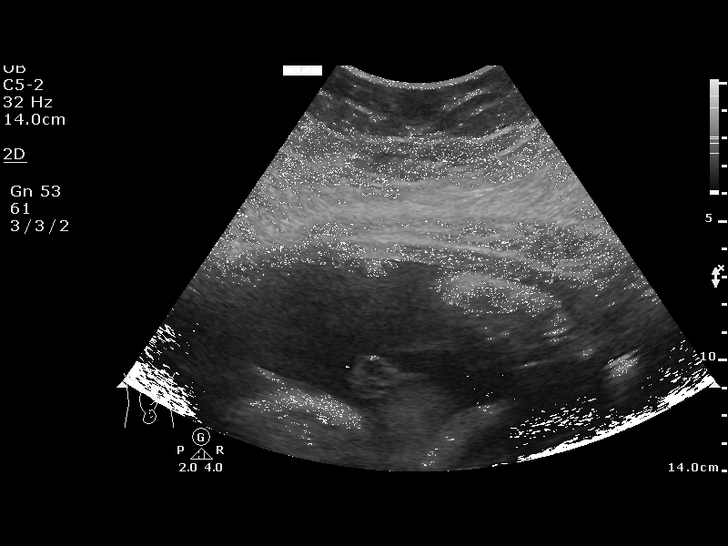
[im 5/13]
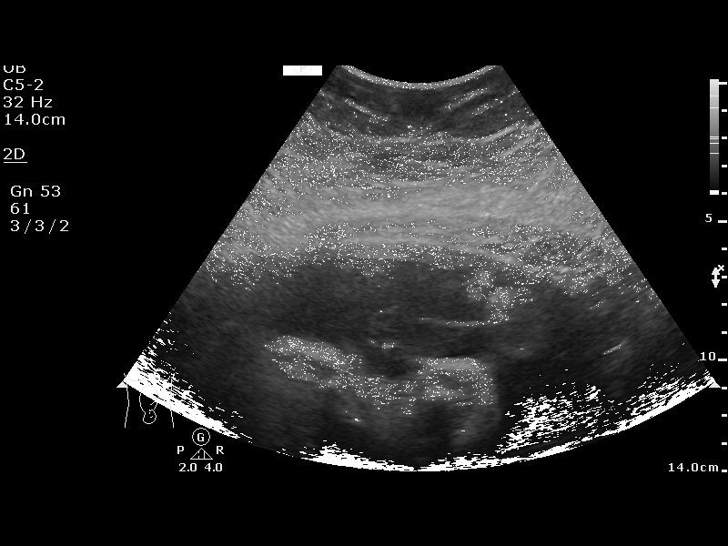
[im 6/13]
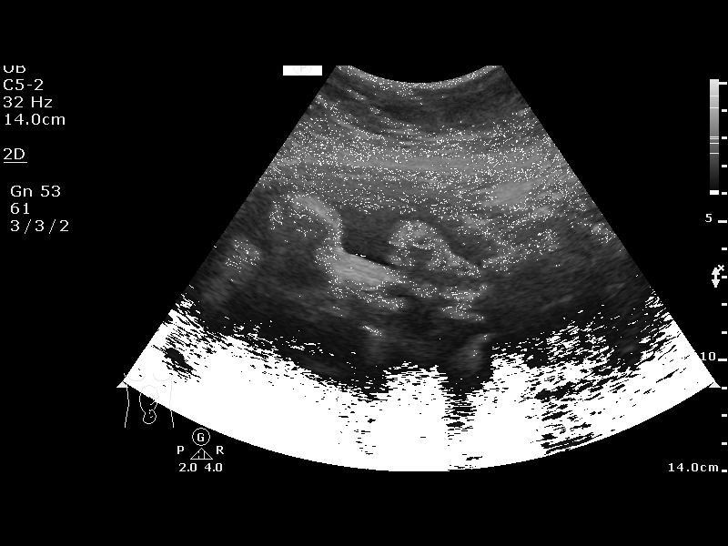
[im 7/13]
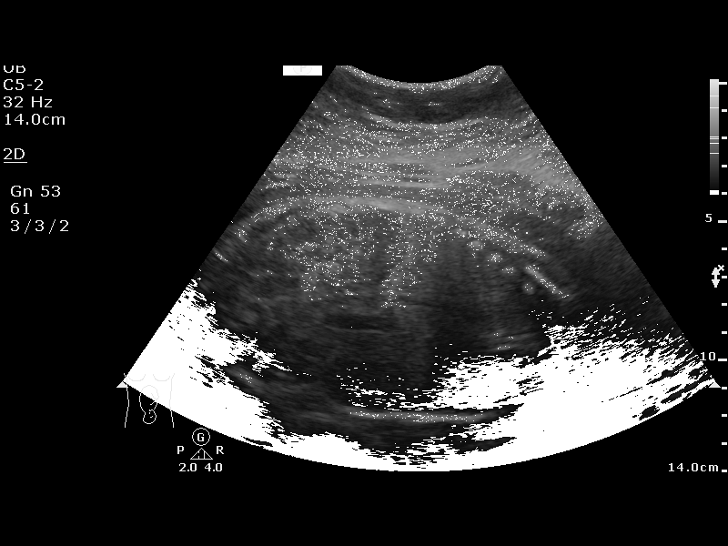
[im 8/13]
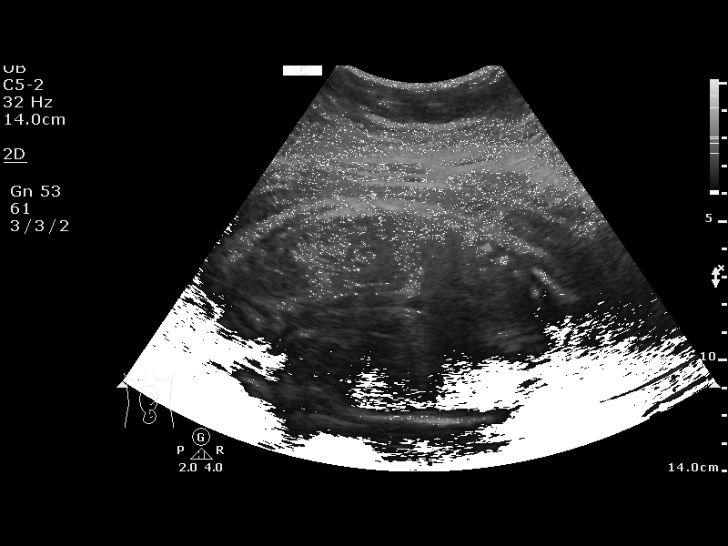
[im 9/13]
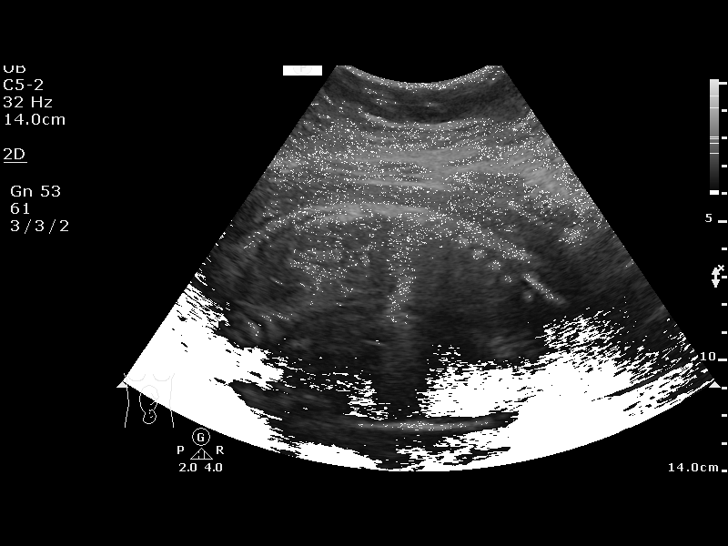
[im 10/13]
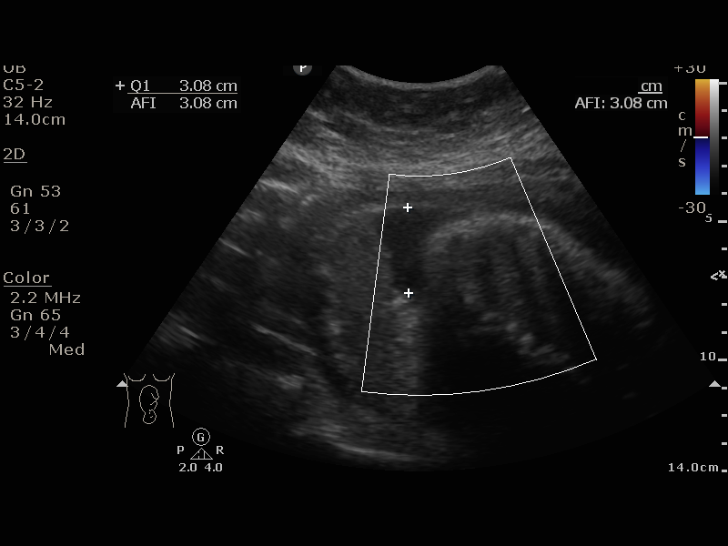
[im 11/13]
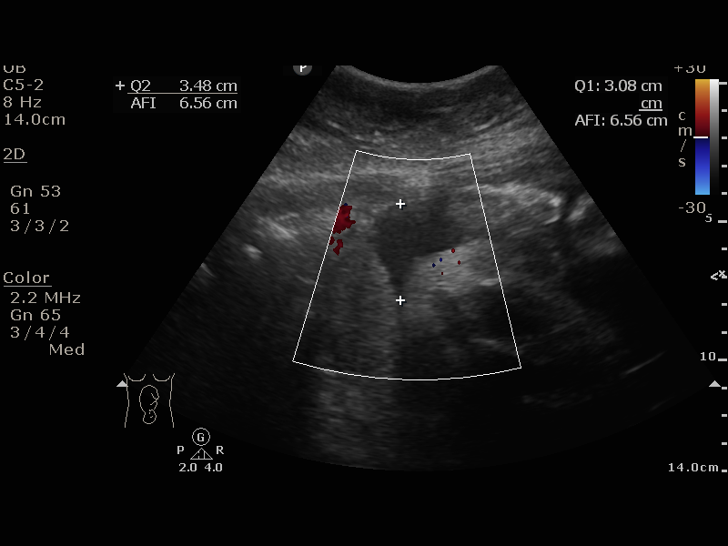
[im 12/13]
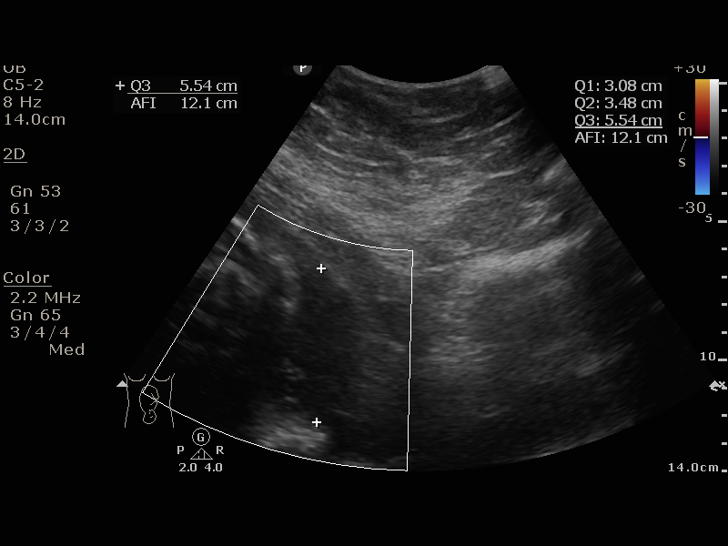
[im 13/13]
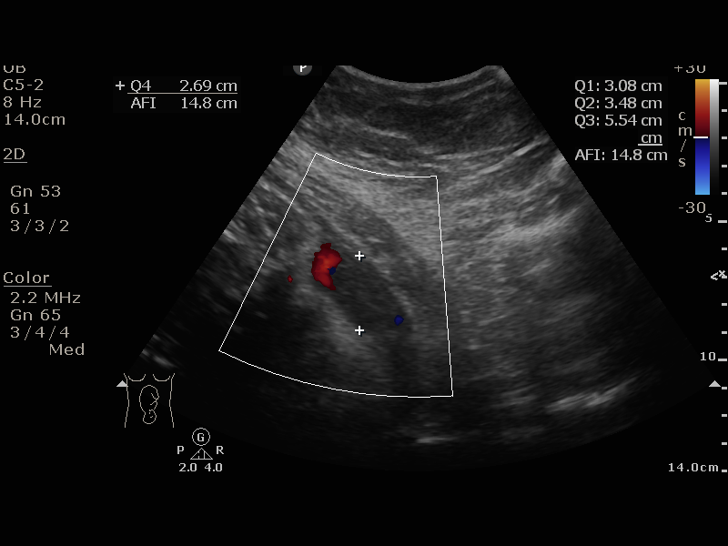

[13 of 13 positions shown; findings below may reference images not displayed]

BSN
Women's
[REDACTED]

1  US FETAL BPP W/NONSTRESS                    76818.4

1  NOMASIBULELE MOATSHE                [id number]      [id number]     [id number]
Indications

32 weeks gestation of pregnancy
Unspecified pre-existing hypertension
complicating pregnancy, third trimester
OB History

Blood Type:            Height:  5'3"   Weight (lb):  231       BMI:
Gravidity:    1
Fetal Evaluation

Num Of Fetuses:     1
Preg. Location:     Intrauterine
Cardiac Activity:   Observed
Fetal Lie:          Maternal right side
Presentation:       Cephalic

Amniotic Fluid
AFI FV:      Subjectively within normal limits

AFI Sum(cm)     %Tile       Largest Pocket(cm)
14.79           52

RUQ(cm)       RLQ(cm)       LUQ(cm)        LLQ(cm)
3.08
Biophysical Evaluation

Amniotic F.V:   Pocket => 2 cm two         F. Tone:        Observed
planes
F. Movement:    Observed                   N.S.T:          Nonreactive
F. Breathing:   Observed                   Score:          [DATE]
Gestational Age

LMP:           32w 0d        Date:  01/23/17                 EDD:   10/30/17
Best:          32w 0d     Det. By:  LMP  (01/23/17)          EDD:   10/30/17
Impression

Hypertension in pregnancy- reassuring testing
Recommendations

Continue weekly BPP until delivery

## 2020-08-09 NOTE — Progress Notes (Deleted)
   Subjective:   Patient ID: Leslie Skinner    DOB: [birth year], 25 y.o. female   MRN: [id number]  Leslie Skinner is a 25 y.o. female with a history of chronic HTN, migraine, GERD, scoliosis, anxiety, chronic b/l thoracic back pain, MDD, h/o PPD, prediabetes, obesity,  here for contraception management.  Contraceptive Counseling: Patient presenting today to discuss birth control options. She is currently on *** . She is sexually active with *** *** partners. She does/does not use condoms. She denies any dyspareunia, dysuria, hematuria, vaginal discharge. LMP*** Other past birth controls used include: She is a nonsmoker. She denies history of migraines with aura or clotting disorders/history of blood clots.   Review of Systems:  Per HPI.   Objective:   There were no vitals taken for this visit. Vitals and nursing note reviewed.  General: pleasant ***, sitting comfortably in exam chair, well nourished, well developed, in no acute distress with non-toxic appearance HEENT: normocephalic, atraumatic, moist mucous membranes, oropharynx clear without erythema or exudate, TM normal bilaterally  Neck: supple, non-tender without lymphadenopathy CV: regular rate and rhythm without murmurs, rubs, or gallops, no lower extremity edema, 2+ radial and pedal pulses bilaterally Lungs: clear to auscultation bilaterally with normal work of breathing on room air Resp: breathing comfortably on room air, speaking in full sentences Abdomen: soft, non-tender, non-distended, no masses or organomegaly palpable, normoactive bowel sounds Skin: warm, dry, no rashes or lesions Extremities: warm and well perfused, normal tone MSK: ROM grossly intact, strength intact, gait normal Neuro: Alert and oriented, speech normal  Assessment & Plan:   No problem-specific Assessment & Plan notes found for this encounter.  No orders of the defined types were placed in this encounter.  No orders of the defined types  were placed in this encounter.   {    This will disappear when note is signed, click to select method of visit    :1}  Mina Marble, DO PGY-3, [facility] 08/09/2020 7:39 AM

## 2020-08-10 ENCOUNTER — Ambulatory Visit: Payer: Medicaid Other | Admitting: Family Medicine

## 2020-10-12 ENCOUNTER — Ambulatory Visit (INDEPENDENT_AMBULATORY_CARE_PROVIDER_SITE_OTHER): Payer: Medicaid Other | Admitting: Family Medicine

## 2020-10-12 ENCOUNTER — Encounter: Payer: Self-pay | Admitting: Family Medicine

## 2020-10-12 ENCOUNTER — Other Ambulatory Visit: Payer: Self-pay

## 2020-10-12 VITALS — BP 108/70 | HR 65 | Wt 240.2 lb

## 2020-10-12 DIAGNOSIS — Z975 Presence of (intrauterine) contraceptive device: Secondary | ICD-10-CM | POA: Diagnosis not present

## 2020-10-12 DIAGNOSIS — Z30432 Encounter for removal of intrauterine contraceptive device: Secondary | ICD-10-CM | POA: Diagnosis not present

## 2020-10-12 DIAGNOSIS — Z30016 Encounter for initial prescription of transdermal patch hormonal contraceptive device: Secondary | ICD-10-CM | POA: Diagnosis present

## 2020-10-12 MED ORDER — NORELGESTROMIN-ETH ESTRADIOL 150-35 MCG/24HR TD PTWK: 1.0000 | MEDICATED_PATCH | TRANSDERMAL | 12 refills | Status: DC

## 2020-10-12 NOTE — Assessment & Plan Note (Addendum)
Post placental IUD placed in Feb 2021.  IUD removed per pt request due to ongoing pelvic pain. Reports improved pain since removal today.

## 2020-10-12 NOTE — Assessment & Plan Note (Addendum)
The patient denies history of VTE. They have no history of migraine with aura. Does have hypertension. They have no other contraindications for estrogen therapy. Declines Nexplanon. Depo provera caused weight gain. Possibly interested in OCPs or patches. Education given regarding options for contraception, including barrier methods, injectable contraception, IUD replacement, oral contraceptives. Patient choose transdermal patches. Advised to use barrier protection for next 7 days.  - Start [employer]

## 2020-10-12 NOTE — Progress Notes (Signed)
   SUBJECTIVE:   CHIEF COMPLAINT / HPI:   Chief Complaint  Patient presents with   IUD removal     Leslie Skinner is a 25 y.o. female here for  IUD string check but would like IUD removed.  Pt having pelivic pain since having ppIUD placed May 03 2019 after her daughter. She no longer wants to have an IUD. She does not smoke.     PERTINENT  PMH / PSH: reviewed and updated as appropriate   OBJECTIVE:   BP 108/70   Pulse 65   Wt 240 lb 3.2 oz (109 kg)   Breastfeeding No   BMI 43.93 kg/m    GEN: well appearing female in no acute distress  CVS: well perfused  RESP: speaking in full sentences without pause  ABD: soft, non-tender, non-distended, no palpable masses  Pelvic exam: normal external genitalia, vulva, VAGINA and CERVIX: normal appearing cervix without discharge or lesions, ADNEXA: normal adnexa in size, nontender and no masses, exam chaperoned by CMA Shari    PROCEDURE NOTE: IUD removal  Patient given informed consent for IUD removal. She is aware this will stop the birth control method provided by the IUD immediately. Informed consent given and signed copy in the chart.Appropriate time out taken. Patient placed in the lithotomy position and the cervix brought into view using speculum. The IUD strings were identified coming from the cervical os. These strings were grasped with ring forceps, and the IUD withdrawn gently from the uterus. There were no complications and no blood loss. Patient tolerated the procedure well.   ASSESSMENT/PLAN:   IUD contraception Post placental IUD placed in Feb 2021.  IUD removed per pt request due to ongoing pelvic pain. Reports improved pain since removal today.  Contraception management The patient denies history of VTE. They have no history of migraine with aura. Does have hypertension. They have no other contraindications for estrogen therapy. Declines Nexplanon. Depo provera caused weight gain. Possibly interested in OCPs or  patches. Education given regarding options for contraception, including barrier methods, injectable contraception, IUD replacement, oral contraceptives. Patient choose transdermal patches. Advised to use barrier protection for next 7 days.  - Start [employer]    Lyndee Hensen, DO PGY-3, [facility] 10/12/2020

## 2020-10-12 NOTE — Patient Instructions (Signed)
It was great seeing you today!   Stop by the pharmacy to pick up your birth control patches. Remove once a week.  For the next 7 days be sure to use condoms.   If you have questions or concerns please do not hesitate to call at [phone].  Dr. Rushie Chestnut [facility]

## 2021-01-13 ENCOUNTER — Emergency Department (HOSPITAL_COMMUNITY)
Admission: EM | Admit: 2021-01-13 | Discharge: 2021-01-14 | Disposition: A | Discharge status: Home / Self Care | Payer: Medicaid Other | Attending: Emergency Medicine | Admitting: Emergency Medicine

## 2021-01-13 ENCOUNTER — Encounter (HOSPITAL_COMMUNITY): Payer: Self-pay

## 2021-01-13 DIAGNOSIS — M791 Myalgia, unspecified site: Secondary | ICD-10-CM | POA: Insufficient documentation

## 2021-01-13 DIAGNOSIS — Z20822 Contact with and (suspected) exposure to covid-19: Secondary | ICD-10-CM | POA: Diagnosis not present

## 2021-01-13 DIAGNOSIS — R0981 Nasal congestion: Secondary | ICD-10-CM | POA: Diagnosis not present

## 2021-01-13 DIAGNOSIS — J029 Acute pharyngitis, unspecified: Secondary | ICD-10-CM | POA: Diagnosis present

## 2021-01-13 DIAGNOSIS — Z79899 Other long term (current) drug therapy: Secondary | ICD-10-CM | POA: Insufficient documentation

## 2021-01-13 DIAGNOSIS — I10 Essential (primary) hypertension: Secondary | ICD-10-CM | POA: Diagnosis not present

## 2021-01-13 DIAGNOSIS — E119 Type 2 diabetes mellitus without complications: Secondary | ICD-10-CM | POA: Insufficient documentation

## 2021-01-13 DIAGNOSIS — R509 Fever, unspecified: Secondary | ICD-10-CM | POA: Insufficient documentation

## 2021-01-13 LAB — RESP PANEL BY RT-PCR (FLU A&B, COVID) ARPGX2
Influenza A by PCR: NEGATIVE
Influenza B by PCR: NEGATIVE
SARS Coronavirus 2 by RT PCR: NEGATIVE

## 2021-01-13 NOTE — ED Provider Notes (Signed)
Emergency Medicine Provider Triage Evaluation Note  Leslie Skinner , a 25 y.o. female  was evaluated in triage.  Pt complains of several days of nasal congestion, headaches, sore throat, body aches, fever.  Taking over-the-counter medication.  No sick contacts.  No cough or difficulty breathing.  No urinary symptoms.  No diarrhea.  Took Tylenol just prior to arrival  Review of Systems  Positive: Fever, st, ha, body aches Negative: cough  Physical Exam  BP (!) 142/89 (BP Location: Left Arm)   Pulse (!) 130   Temp (!) 102.8 F (39.3 C) (Oral)   Resp 19   SpO2 97%  Gen:   Awake, no distress   Resp:  Normal effort  MSK:   Moves extremities without difficulty  Other:  No ttp of abd  Medical Decision Making  Medically screening exam initiated at 9:10 PM.  Appropriate orders placed.  Leslie Skinner was informed that the remainder of the evaluation will be completed by another provider, this initial triage assessment does not replace that evaluation, and the importance of remaining in the ED until their evaluation is complete.  Resp panel   Franchot Heidelberg, PA-C 01/13/21 2110    Jeanell Sparrow, DO 01/13/21 2148

## 2021-01-13 NOTE — ED Triage Notes (Signed)
Pt states that for the past few days she has been having flu like symptoms, headaches, sore throat, body aches, fevers

## 2021-01-14 ENCOUNTER — Other Ambulatory Visit: Payer: Self-pay

## 2021-01-14 LAB — GROUP A STREP BY PCR: Group A Strep by PCR: NOT DETECTED

## 2021-01-14 MED ORDER — NAPROXEN 125 MG/5ML PO SUSP: 250.0000 mg | Freq: Two times a day (BID) | ORAL | 0 refills | Status: DC | PRN

## 2021-01-14 MED ORDER — LIDOCAINE VISCOUS HCL 2 % MT SOLN: 15.0000 mL | OROMUCOSAL | 0 refills | Status: DC | PRN

## 2021-01-14 MED ORDER — ACETAMINOPHEN 325 MG PO TABS
650.0000 mg | ORAL_TABLET | Freq: Once | ORAL | Status: DC
  Filled 2021-01-14: qty 2

## 2021-01-14 MED ORDER — LIDOCAINE VISCOUS HCL 2 % MT SOLN
15.0000 mL | Freq: Once | OROMUCOSAL | Status: AC
  Administered 2021-01-14: 15 mL via OROMUCOSAL
  Filled 2021-01-14: qty 15

## 2021-01-14 MED ORDER — ACETAMINOPHEN 160 MG/5ML PO SOLN
650.0000 mg | Freq: Once | ORAL | Status: AC
  Administered 2021-01-14: 650 mg via ORAL
  Filled 2021-01-14: qty 20.3

## 2021-01-14 NOTE — Discharge Instructions (Addendum)
You are seen in the emergency department today for sore throat with a fever.  Your flu, COVID, and strep test were all negative.  At this point we suspect that you have a virus causing your symptoms.  We are sending you home with the following medicines to help your symptoms:  - Naproxen is a nonsteroidal anti-inflammatory medication that will help with pain and swelling. Be sure to take this medication as prescribed with food, 1 pill every 12 hours,  It should be taken with food, as it can cause stomach upset, and more seriously, stomach bleeding. Do not take other nonsteroidal anti-inflammatory medications with this such as Advil, Motrin, Aleve, Mobic, Goodie Powder, or Motrin.    - viscous lidocaine- take every 4 hours as needed for pain.   You make take Tylenol per over the counter dosing with these medications.   We have prescribed you new medication(s) today. Discuss the medications prescribed today with your pharmacist as they can have adverse effects and interactions with your other medicines including over the counter and prescribed medications. Seek medical evaluation if you start to experience new or abnormal symptoms after taking one of these medicines, seek care immediately if you start to experience difficulty breathing, feeling of your throat closing, facial swelling, or rash as these could be indications of a more serious allergic reaction  Please follow-up with primary care within 3 days.  Return to the emergency department for new or worsening symptoms including but not limited to new or worsening pain, inability to keep fluids down, vomiting, passing out, chest pain, trouble breathing, trouble swallowing, or any other concerns.

## 2021-01-14 NOTE — ED Notes (Signed)
RN reviewed discharge instructions with pt. Pt verbalized understanding and had no further questions. VSS upon discharge.  

## 2021-01-14 NOTE — ED Notes (Signed)
Pt attempted to swallow pills but couldn't due to severe sore throat. EDP made aware

## 2021-01-14 NOTE — ED Provider Notes (Signed)
[facility] EMERGENCY DEPARTMENT Provider Note   CSN: [id number] Arrival date & time: 01/13/21  1856     History Chief Complaint  Patient presents with   Influenza    Leslie Skinner is a 25 y.o. female with a hx of T2DM, hypertension, migraines, and scoliosis who presents to the ED with complaints of flu like sxs x 2 days. Patient reports congestion, sore throat, fever, chills, & body aches. Sore throat worse with swallowing but able to swallow. Denies chest pain, dyspnea, cough, N/V/D, abdominal pain, ear pain, dysuria, frequency, urgency, or rashes.  HPI     Past Medical History:  Diagnosis Date   Acanthosis nigricans, acquired    Constipation    Diabetes mellitus type II    Young age around 2 per pt   GERD (gastroesophageal reflux disease)    Goiter    Hypertension    Migraines    Obesity    Osteochondroma of lower leg    Pollen allergies    Pregnancy induced hypertension    Scoliosis    Spinal headache     Patient Active Problem List   Diagnosis Date Noted   IUD contraception 01/28/2020   History of postpartum depression 05/03/2019   Chronic bilateral thoracic back pain 08/05/2018   Current mild episode of major depressive disorder ([place]) 11/14/2017   Contraception management 09/30/2014   Anxiety state 06/12/2012   Pre-diabetes 06/12/2012   Obesity, Class III, BMI 40-49.9 (morbid obesity) ([place]) 08/23/2011   Scoliosis 11/22/2010   Migraine 11/08/2010   GERD (gastroesophageal reflux disease)    Chronic hypertension during pregnancy, antepartum 05/16/2010    Past Surgical History:  Procedure Laterality Date   LOWER LEG SOFT TISSUE TUMOR EXCISION  2011   OSTEOCHONDROMA EXCISION       OB History     Gravida  2   Para  2   Term  2   Preterm      AB      Living  1      SAB      IAB      Ectopic      Multiple  0   Live Births  1           Family History  Problem Relation Age of Onset   Diabetes Mother     Ulcers Mother    Thyroid disease Mother    Cancer Mother    Obesity Mother    Heart disease Mother    Heart attack Mother        caused death    Diabetes Maternal Grandmother    Stroke Maternal Grandmother    Ulcers Maternal Grandmother    Diabetes Maternal Grandfather    GER disease Maternal Grandfather    Heart disease Maternal Grandfather    GER disease Sister    Diabetes Father    GER disease Father    Obesity Sister    Diabetes Paternal Grandmother    Thyroid disease Paternal Grandmother    Diabetes Paternal Grandfather    Heart disease Maternal Aunt    Diabetes Maternal Aunt    Thyroid disease Maternal Aunt    Cancer Maternal Aunt    Obesity Maternal Aunt    GER disease Maternal Aunt     Social History   Tobacco Use   Smoking status: Never    Passive exposure: Yes   Smokeless tobacco: Never  Vaping Use   Vaping Use: Never used  Substance Use Topics  Alcohol use: No   Drug use: No    Home Medications Prior to Admission medications   Medication Sig Start Date End Date Taking? Authorizing Provider  acetaminophen (TYLENOL) 325 MG tablet Take 2 tablets (650 mg total) by mouth every 6 (six) hours as needed (for pain scale < 4). 05/04/19   Fair, Marin Shutter, MD  Blood Pressure Monitoring (BLOOD PRESSURE MONITOR AUTOMAT) DEVI 1 Device by Does not apply route daily. Automatic Blood pressure monitor with regular/large cuff. To monitor blood pressure at home on regular bases. ICD-10 Code: O09.90. 02/09/19   Donnamae Jude, MD  cyclobenzaprine (FLEXERIL) 10 MG tablet Take 1 tablet (10 mg total) by mouth every 8 (eight) hours as needed for muscle spasms. 02/25/19   Tresea Mall, CNM  Elastic Bandages & Supports (COMFORT FIT MATERNITY SUPP LG) MISC 1 Units by Does not apply route daily. 01/29/19   Laury Deep, CNM  enalapril (VASOTEC) 5 MG tablet Take 1 tablet (5 mg total) by mouth daily. 05/05/19   Fair, Marin Shutter, MD  ibuprofen (ADVIL) 600 MG tablet Take 1 tablet (600  mg total) by mouth every 6 (six) hours. 05/04/19   Fair, Marin Shutter, MD  norelgestromin-ethinyl estradiol (ORTHO EVRA) 150-35 MCG/24HR transdermal patch Place 1 patch onto the skin once a week. 10/12/20   Lyndee Hensen, DO  Prenatal Vit-Fe Fumarate-FA (PRENATAL COMPLETE) 14-0.4 MG TABS Take 1 tablet by mouth daily. 12/15/18   Laury Deep, CNM  senna-docusate (SENOKOT-S) 8.6-50 MG tablet Take 2 tablets by mouth daily. 05/05/19   Fair, Marin Shutter, MD  sertraline (ZOLOFT) 50 MG tablet Take 1 tablet (50 mg total) by mouth daily. 05/04/19   FairMarin Shutter, MD    Allergies    Morphine and related and Tramadol  Review of Systems   Review of Systems  Constitutional:  Positive for chills and fever.  HENT:  Positive for congestion and sore throat. Negative for ear pain.   Respiratory:  Negative for cough and shortness of breath.   Cardiovascular:  Negative for chest pain.  Gastrointestinal:  Negative for abdominal pain, diarrhea, nausea and vomiting.  Genitourinary:  Negative for dysuria, frequency and urgency.  Musculoskeletal:  Positive for myalgias.  All other systems reviewed and are negative.  Physical Exam Updated Vital Signs BP 129/86 (BP Location: Left Arm)   Pulse (!) 114   Temp (!) 102.6 F (39.2 C) (Oral)   Resp 18   Ht 5\' 2"  (1.575 m)   Wt 111.1 kg   SpO2 98%   BMI 44.81 kg/m   Physical Exam Vitals and nursing note reviewed.  Constitutional:      General: She is not in acute distress.    Appearance: She is well-developed.  HENT:     Head: Normocephalic and atraumatic.     Right Ear: Ear canal normal. Tympanic membrane is not perforated, erythematous, retracted or bulging.     Left Ear: Ear canal normal. Tympanic membrane is not perforated, erythematous, retracted or bulging.     Ears:     Comments: No mastoid erythema/swelling/tenderness.     Nose:     Right Sinus: No maxillary sinus tenderness or frontal sinus tenderness.     Left Sinus: No maxillary sinus tenderness  or frontal sinus tenderness.     Mouth/Throat:     Pharynx: Uvula midline. Posterior oropharyngeal erythema present. No oropharyngeal exudate.     Tonsils: Tonsillar exudate present. 2+ on the right. 2+ on the left.  Comments: Posterior oropharynx is symmetric appearing. Patient tolerating own secretions without difficulty. No trismus. No drooling. No hot potato voice. No swelling beneath the tongue, submandibular compartment is soft.  Eyes:     General:        Right eye: No discharge.        Left eye: No discharge.     Conjunctiva/sclera: Conjunctivae normal.     Pupils: Pupils are equal, round, and reactive to light.  Cardiovascular:     Rate and Rhythm: Regular rhythm. Tachycardia present.     Heart sounds: No murmur heard. Pulmonary:     Effort: Pulmonary effort is normal. No respiratory distress.     Breath sounds: Normal breath sounds. No wheezing, rhonchi or rales.  Abdominal:     General: There is no distension.     Palpations: Abdomen is soft.     Tenderness: There is no abdominal tenderness.  Musculoskeletal:     Cervical back: Normal range of motion and neck supple. No edema or rigidity.  Lymphadenopathy:     Cervical: No cervical adenopathy.  Skin:    General: Skin is warm and dry.     Findings: No rash.  Neurological:     Mental Status: She is alert.  Psychiatric:        Behavior: Behavior normal.    ED Results / Procedures / Treatments   Labs (all labs ordered are listed, but only abnormal results are displayed) Labs Reviewed  RESP PANEL BY RT-PCR (FLU A&B, COVID) [id number]  GROUP A STREP BY PCR    EKG None  Radiology No results found.  Procedures Procedures   Medications Ordered in ED Medications  lidocaine (XYLOCAINE) 2 % viscous mouth solution 15 mL (15 mLs Mouth/Throat Given 01/14/21 0320)  acetaminophen (TYLENOL) 160 MG/5ML solution 650 mg (650 mg Oral Given 01/14/21 0320)    ED Course  I have reviewed the triage vital signs and the nursing  notes.  Pertinent labs & imaging results that were available during my care of the patient were reviewed by me and considered in my medical decision making (see chart for details).    MDM Rules/Calculators/A&P                           Patient presents to the ED with complaints of flu like sxs  Patient is nontoxic, in no acute distress, vitals notable for tachycardia and fever.   Additional history obtained:  Additional history obtained from chart review & nursing note review.   Lab Tests:  I Ordered, reviewed, and interpreted labs, which included:  COVID/flu/strep test: Negative  ED Course:  Exam is without signs of AOM, AOE, or mastoiditis. Strep negative, exam not consistent w/ RPA/PTA at this time. No sinus tenderness. No meningeal signs. Lungs are CTA without focal adventitious sounds, no signs of increased work of breathing, no respiratory complaints, doubt CAP. Abdomen nontender w/o peritoneal signs.  No urinary symptoms.  Overall suspect viral at this time, would consider mono, however very early in sxs. recommended supportive care. I discussed results, treatment plan, need for follow-up, and return precautions with the patient. Provided opportunity for questions, patient confirmed understanding and is in agreement with plan.    Portions of this note were generated with [employer]. Dictation errors may occur despite best attempts at proofreading.   Final Clinical Impression(s) / ED Diagnoses Final diagnoses:  Pharyngitis, unspecified etiology    Rx / DC Orders ED Discharge  Orders          Ordered    naproxen (NAPROSYN) 125 MG/5ML suspension  2 times daily PRN        01/14/21 0514    lidocaine (XYLOCAINE) 2 % solution  As needed        01/14/21 0514             Amaryllis Dyke, PA-C 01/14/21 0515    Quintella Reichert, MD 01/14/21 [phone]

## 2021-01-15 ENCOUNTER — Emergency Department (HOSPITAL_COMMUNITY): Payer: Medicaid Other

## 2021-01-15 ENCOUNTER — Emergency Department (HOSPITAL_COMMUNITY)
Admission: EM | Admit: 2021-01-15 | Discharge: 2021-01-15 | Disposition: A | Discharge status: Home / Self Care | Payer: Medicaid Other | Attending: Emergency Medicine | Admitting: Emergency Medicine

## 2021-01-15 ENCOUNTER — Other Ambulatory Visit: Payer: Self-pay

## 2021-01-15 ENCOUNTER — Encounter (HOSPITAL_COMMUNITY): Payer: Self-pay | Admitting: Emergency Medicine

## 2021-01-15 DIAGNOSIS — J039 Acute tonsillitis, unspecified: Secondary | ICD-10-CM | POA: Insufficient documentation

## 2021-01-15 DIAGNOSIS — E119 Type 2 diabetes mellitus without complications: Secondary | ICD-10-CM | POA: Insufficient documentation

## 2021-01-15 DIAGNOSIS — Z79899 Other long term (current) drug therapy: Secondary | ICD-10-CM | POA: Insufficient documentation

## 2021-01-15 DIAGNOSIS — Z7722 Contact with and (suspected) exposure to environmental tobacco smoke (acute) (chronic): Secondary | ICD-10-CM | POA: Diagnosis not present

## 2021-01-15 DIAGNOSIS — I10 Essential (primary) hypertension: Secondary | ICD-10-CM | POA: Insufficient documentation

## 2021-01-15 DIAGNOSIS — F410 Panic disorder [episodic paroxysmal anxiety] without agoraphobia: Secondary | ICD-10-CM | POA: Diagnosis not present

## 2021-01-15 DIAGNOSIS — R070 Pain in throat: Secondary | ICD-10-CM | POA: Diagnosis present

## 2021-01-15 LAB — CBC WITH DIFFERENTIAL/PLATELET
Abs Immature Granulocytes: 0.06 10*3/uL (ref 0.00–0.07)
Basophils Absolute: 0 10*3/uL (ref 0.0–0.1)
Basophils Relative: 0 %
Eosinophils Absolute: 0 10*3/uL (ref 0.0–0.5)
Eosinophils Relative: 0 %
HCT: 36.8 % (ref 36.0–46.0)
Hemoglobin: 12.2 g/dL (ref 12.0–15.0)
Immature Granulocytes: 0 %
Lymphocytes Relative: 20 %
Lymphs Abs: 2.9 10*3/uL (ref 0.7–4.0)
MCH: 26.3 pg (ref 26.0–34.0)
MCHC: 33.2 g/dL (ref 30.0–36.0)
MCV: 79.3 fL — ABNORMAL LOW (ref 80.0–100.0)
Monocytes Absolute: 1.3 10*3/uL — ABNORMAL HIGH (ref 0.1–1.0)
Monocytes Relative: 9 %
Neutro Abs: 9.9 10*3/uL — ABNORMAL HIGH (ref 1.7–7.7)
Neutrophils Relative %: 71 %
Platelets: 194 10*3/uL (ref 150–400)
RBC: 4.64 MIL/uL (ref 3.87–5.11)
RDW: 14.5 % (ref 11.5–15.5)
WBC: 14.2 10*3/uL — ABNORMAL HIGH (ref 4.0–10.5)
nRBC: 0 % (ref 0.0–0.2)

## 2021-01-15 LAB — URINALYSIS, ROUTINE W REFLEX MICROSCOPIC
Bacteria, UA: NONE SEEN
Bilirubin Urine: NEGATIVE
Glucose, UA: NEGATIVE mg/dL
Hgb urine dipstick: NEGATIVE
Ketones, ur: 5 mg/dL — AB
Leukocytes,Ua: NEGATIVE
Nitrite: NEGATIVE
Protein, ur: 100 mg/dL — AB
Specific Gravity, Urine: 1.019 (ref 1.005–1.030)
pH: 6 (ref 5.0–8.0)

## 2021-01-15 LAB — COMPREHENSIVE METABOLIC PANEL
ALT: 27 U/L (ref 0–44)
AST: 27 U/L (ref 15–41)
Albumin: 3.9 g/dL (ref 3.5–5.0)
Alkaline Phosphatase: 89 U/L (ref 38–126)
Anion gap: 10 (ref 5–15)
BUN: 9 mg/dL (ref 6–20)
CO2: 28 mmol/L (ref 22–32)
Calcium: 8.6 mg/dL — ABNORMAL LOW (ref 8.9–10.3)
Chloride: 98 mmol/L (ref 98–111)
Creatinine, Ser: 1 mg/dL (ref 0.44–1.00)
GFR, Estimated: >60 mL/min (ref >60)
Glucose, Bld: 100 mg/dL — ABNORMAL HIGH (ref 70–99)
Potassium: 2.6 mmol/L — CL (ref 3.5–5.1)
Sodium: 136 mmol/L (ref 135–145)
Total Bilirubin: 1.4 mg/dL — ABNORMAL HIGH (ref 0.3–1.2)
Total Protein: 8.4 g/dL — ABNORMAL HIGH (ref 6.5–8.1)

## 2021-01-15 MED ORDER — IBUPROFEN 100 MG/5ML PO SUSP
600.0000 mg | Freq: Once | ORAL | Status: DC
  Filled 2021-01-15: qty 30

## 2021-01-15 MED ORDER — NYSTATIN 100000 UNIT/ML MT SUSP: 5.0000 mL | Freq: Three times a day (TID) | OROMUCOSAL | 0 refills | Status: DC | PRN

## 2021-01-15 MED ORDER — IBUPROFEN 100 MG/5ML PO SUSP: 400.0000 mg | Freq: Four times a day (QID) | ORAL | 0 refills | Status: DC | PRN

## 2021-01-15 MED ORDER — MIDAZOLAM HCL 2 MG/2ML IJ SOLN: 1.0000 mg | Freq: Once | INTRAMUSCULAR | Status: DC

## 2021-01-15 MED ORDER — POTASSIUM CHLORIDE 10 MEQ/100ML IV SOLN
10.0000 meq | INTRAVENOUS | Status: DC
  Administered 2021-01-15: 10 meq via INTRAVENOUS
  Filled 2021-01-15: qty 100

## 2021-01-15 MED ORDER — MIDAZOLAM HCL 2 MG/2ML IJ SOLN: 2.0000 mg | Freq: Once | INTRAMUSCULAR | Status: DC

## 2021-01-15 MED ORDER — KETOROLAC TROMETHAMINE 30 MG/ML IJ SOLN
15.0000 mg | Freq: Once | INTRAMUSCULAR | Status: AC
  Administered 2021-01-15: 15 mg via INTRAVENOUS
  Filled 2021-01-15: qty 1

## 2021-01-15 MED ORDER — PENICILLIN G BENZATHINE 1200000 UNIT/2ML IM SUSY
1.2000 10*6.[IU] | PREFILLED_SYRINGE | Freq: Once | INTRAMUSCULAR | Status: AC
  Administered 2021-01-15: 1.2 10*6.[IU] via INTRAMUSCULAR
  Filled 2021-01-15: qty 2

## 2021-01-15 MED ORDER — LACTATED RINGERS IV BOLUS
1000.0000 mL | Freq: Once | INTRAVENOUS | Status: AC
  Administered 2021-01-15: 1000 mL via INTRAVENOUS

## 2021-01-15 MED ORDER — POTASSIUM CHLORIDE CRYS ER 20 MEQ PO TBCR
40.0000 meq | EXTENDED_RELEASE_TABLET | Freq: Once | ORAL | Status: DC
  Filled 2021-01-15: qty 2

## 2021-01-15 MED ORDER — DEXAMETHASONE SODIUM PHOSPHATE 10 MG/ML IJ SOLN
10.0000 mg | Freq: Once | INTRAMUSCULAR | Status: AC
  Administered 2021-01-15: 10 mg via INTRAVENOUS
  Filled 2021-01-15: qty 1

## 2021-01-15 MED ORDER — LIDOCAINE VISCOUS HCL 2 % MT SOLN
15.0000 mL | Freq: Once | OROMUCOSAL | Status: AC
  Administered 2021-01-15: 15 mL via OROMUCOSAL
  Filled 2021-01-15: qty 15

## 2021-01-15 MED ORDER — POTASSIUM CHLORIDE 20 MEQ PO PACK
60.0000 meq | PACK | Freq: Every day | ORAL | Status: DC
  Administered 2021-01-15: 60 meq via ORAL
  Filled 2021-01-15: qty 3

## 2021-01-15 MED ORDER — SODIUM CHLORIDE 0.9 % IV BOLUS
1000.0000 mL | Freq: Once | INTRAVENOUS | Status: AC
  Administered 2021-01-15: 1000 mL via INTRAVENOUS

## 2021-01-15 MED ORDER — MAGNESIUM SULFATE 2 GM/50ML IV SOLN
2.0000 g | Freq: Once | INTRAVENOUS | Status: AC
  Administered 2021-01-15: 2 g via INTRAVENOUS
  Filled 2021-01-15: qty 50

## 2021-01-15 MED ORDER — ALBUTEROL SULFATE (2.5 MG/3ML) 0.083% IN NEBU
5.0000 mg | INHALATION_SOLUTION | Freq: Once | RESPIRATORY_TRACT | Status: AC
  Administered 2021-01-15: 5 mg via RESPIRATORY_TRACT
  Filled 2021-01-15: qty 6

## 2021-01-15 NOTE — ED Notes (Signed)
Date and time results received: 01/15/21 2131 (use smartphrase ".now" to insert current time)  Test: potassium Critical Value: 2.6  Name of Provider Notified: Kathrynn Humble  Orders Received? Or Actions Taken?:  n/a

## 2021-01-15 NOTE — ED Provider Notes (Signed)
Emergency Medicine Provider Triage Evaluation Note  Leslie Skinner , a 25 y.o. female  was evaluated in triage.  Pt complains of "I cannot breathe, my hands are numb and I cannot feel anything".  Patient unable to answer questions secondary to hyperventilation..  Review of Systems  Positive: Shortness of breath, hand numbness and tingling. Negative:   Physical Exam  BP (!) 146/110 (BP Location: Right Arm)   Pulse (!) 160   Temp 98.9 F (37.2 C) (Oral)   Resp (!) 46   SpO2 98%  Gen:   Patient screaming, in acute distress, hyperventilating Resp:  Contracted, tachypneic MSK:   Moves extremities without difficulty  Other:  Tachycardic to 160.  Medical Decision Making  Medically screening exam initiated at 5:34 PM.  Appropriate orders placed.  Leslie Skinner was informed that the remainder of the evaluation will be completed by another provider, this initial triage assessment does not replace that evaluation, and the importance of remaining in the ED until their evaluation is complete.  Patient evaluated with Dr. Kathrynn Humble.  She is maintaining an airway and satting at 99%.  Patient given 2 mg of Versed and VBG, BMP, CBC ordered in triage.  Patient will need a room.   Sherrill Raring, PA-C 01/15/21 [place], Ankit, MD 01/15/21 [phone]

## 2021-01-15 NOTE — Discharge Instructions (Addendum)
We suspect that your symptoms will improve over the next 12 to 24 hours.  Continue to take ibuprofen 400 to 600 mg every 6 hours.

## 2021-01-15 NOTE — ED Provider Notes (Signed)
[facility] DEPT Provider Note   CSN: [id number] Arrival date & time: 01/15/21  1603     History Chief Complaint  Patient presents with   Sore Throat    Leslie Skinner is a 25 y.o. female.  HPI     25 year old female comes in with chief complaint of sore throat.   She reports that she has been feeling unwell for the last few days. Nursing staff, attention when patient started having respiratory distress.  Patient allegedly was screaming that she" could not breathe".  When I saw the patient she was tearful, hyperventilating and had contracted upper extremities.  She reports that she has had a sore throat for the past few days, it is not getting better.  She is unable to keep fluid down because of pain.  Patient was seen yesterday in the ER for pharyngitis.  She at that time had passed oral challenge.  She reports that she has pain with swallowing, has not drink any fluid because of it.  Also reports some flank pain and lower abdominal pressure with urination.  Has history of UTI.  Denies any high risk behavior for STDs and denies any vaginal discharge or bleeding.   Past Medical History:  Diagnosis Date   Acanthosis nigricans, acquired    Constipation    Diabetes mellitus type II    Young age around 42 per pt   GERD (gastroesophageal reflux disease)    Goiter    Hypertension    Migraines    Obesity    Osteochondroma of lower leg    Pollen allergies    Pregnancy induced hypertension    Scoliosis    Spinal headache     Patient Active Problem List   Diagnosis Date Noted   IUD contraception 01/28/2020   History of postpartum depression 05/03/2019   Chronic bilateral thoracic back pain 08/05/2018   Current mild episode of major depressive disorder ([place]) 11/14/2017   Contraception management 09/30/2014   Anxiety state 06/12/2012   Pre-diabetes 06/12/2012   Obesity, Class III, BMI 40-49.9 (morbid obesity) ([place]) 08/23/2011   Scoliosis  11/22/2010   Migraine 11/08/2010   GERD (gastroesophageal reflux disease)    Chronic hypertension during pregnancy, antepartum 05/16/2010    Past Surgical History:  Procedure Laterality Date   LOWER LEG SOFT TISSUE TUMOR EXCISION  2011   OSTEOCHONDROMA EXCISION       OB History     Gravida  2   Para  2   Term  2   Preterm      AB      Living  1      SAB      IAB      Ectopic      Multiple  0   Live Births  1           Family History  Problem Relation Age of Onset   Diabetes Mother    Ulcers Mother    Thyroid disease Mother    Cancer Mother    Obesity Mother    Heart disease Mother    Heart attack Mother        caused death    Diabetes Maternal Grandmother    Stroke Maternal Grandmother    Ulcers Maternal Grandmother    Diabetes Maternal Grandfather    GER disease Maternal Grandfather    Heart disease Maternal Grandfather    GER disease Sister    Diabetes Father    GER disease Father  Obesity Sister    Diabetes Paternal Grandmother    Thyroid disease Paternal Grandmother    Diabetes Paternal Grandfather    Heart disease Maternal Aunt    Diabetes Maternal Aunt    Thyroid disease Maternal Aunt    Cancer Maternal Aunt    Obesity Maternal Aunt    GER disease Maternal Aunt     Social History   Tobacco Use   Smoking status: Never    Passive exposure: Yes   Smokeless tobacco: Never  Vaping Use   Vaping Use: Never used  Substance Use Topics   Alcohol use: No   Drug use: No    Home Medications Prior to Admission medications   Medication Sig Start Date End Date Taking? Authorizing Provider  ibuprofen (CHILDRENS IBUPROFEN) 100 MG/5ML suspension Take 20 mLs (400 mg total) by mouth every 6 (six) hours as needed for fever. 01/15/21  Yes Varney Biles, MD  acetaminophen (TYLENOL) 325 MG tablet Take 2 tablets (650 mg total) by mouth every 6 (six) hours as needed (for pain scale < 4). 05/04/19   Fair, Marin Shutter, MD  Blood Pressure Monitoring  (BLOOD PRESSURE MONITOR AUTOMAT) DEVI 1 Device by Does not apply route daily. Automatic Blood pressure monitor with regular/large cuff. To monitor blood pressure at home on regular bases. ICD-10 Code: O09.90. 02/09/19   Donnamae Jude, MD  cyclobenzaprine (FLEXERIL) 10 MG tablet Take 1 tablet (10 mg total) by mouth every 8 (eight) hours as needed for muscle spasms. 02/25/19   Tresea Mall, CNM  Elastic Bandages & Supports (COMFORT FIT MATERNITY SUPP LG) MISC 1 Units by Does not apply route daily. 01/29/19   Laury Deep, CNM  enalapril (VASOTEC) 5 MG tablet Take 1 tablet (5 mg total) by mouth daily. 05/05/19   Fair, Marin Shutter, MD  lidocaine (XYLOCAINE) 2 % solution Use as directed 15 mLs in the mouth or throat as needed for mouth pain. 01/14/21   Petrucelli, Samantha R, PA-C  naproxen (NAPROSYN) 125 MG/5ML suspension Take 10 mLs (250 mg total) by mouth 2 (two) times daily as needed. 01/14/21   Petrucelli, Glynda Jaeger, PA-C  norelgestromin-ethinyl estradiol (ORTHO EVRA) 150-35 MCG/24HR transdermal patch Place 1 patch onto the skin once a week. 10/12/20   Lyndee Hensen, DO  Prenatal Vit-Fe Fumarate-FA (PRENATAL COMPLETE) 14-0.4 MG TABS Take 1 tablet by mouth daily. 12/15/18   Laury Deep, CNM  senna-docusate (SENOKOT-S) 8.6-50 MG tablet Take 2 tablets by mouth daily. 05/05/19   Fair, Marin Shutter, MD  sertraline (ZOLOFT) 50 MG tablet Take 1 tablet (50 mg total) by mouth daily. 05/04/19   Chauncey Mann, MD    Allergies    Morphine and related and Tramadol  Review of Systems   Review of Systems  Unable to perform ROS: Severe respiratory distress   Physical Exam Updated Vital Signs BP 121/68   Pulse (!) 104   Temp 98.9 F (37.2 C) (Oral)   Resp 16   SpO2 100%   Physical Exam Vitals and nursing note reviewed.  Constitutional:      General: She is in acute distress.     Appearance: She is not ill-appearing, toxic-appearing or diaphoretic.  HENT:     Head: Atraumatic.     Comments: Pt  has no trismus, stridor or crepitus over the neck. Pt has L tonsillar enlargement and exudates. Pharynx is erythematous.     Mouth/Throat:     Pharynx: Uvula midline. No pharyngeal swelling, oropharyngeal exudate or uvula swelling.  Tonsils: Tonsillar abscess present. No tonsillar exudate. 1+ on the left.  Cardiovascular:     Rate and Rhythm: Tachycardia present.  Pulmonary:     Effort: Respiratory distress present.     Breath sounds: Normal breath sounds. No stridor. No wheezing.  Musculoskeletal:     Cervical back: Normal range of motion and neck supple.  Lymphadenopathy:     Cervical: Cervical adenopathy present.  Skin:    General: Skin is warm and dry.  Neurological:     Mental Status: She is oriented to person, place, and time.    ED Results / Procedures / Treatments   Labs (all labs ordered are listed, but only abnormal results are displayed) Labs Reviewed  COMPREHENSIVE METABOLIC PANEL - Abnormal; Notable for the following components:      Result Value   Potassium 2.6 (*)    Glucose, Bld 100 (*)    Calcium 8.6 (*)    Total Protein 8.4 (*)    Total Bilirubin 1.4 (*)    All other components within normal limits  CBC WITH DIFFERENTIAL/PLATELET - Abnormal; Notable for the following components:   WBC 14.2 (*)    MCV 79.3 (*)    Neutro Abs 9.9 (*)    Monocytes Absolute 1.3 (*)    All other components within normal limits  BLOOD GAS, VENOUS  URINALYSIS, ROUTINE W REFLEX MICROSCOPIC  GC/CHLAMYDIA PROBE AMP ([address]) NOT AT [facility]    EKG EKG Interpretation  Date/Time:  Sunday January 15 2021 22:08:14 EST Ventricular Rate:  103 PR Interval:  138 QRS Duration: 84 QT Interval:  340 QTC Calculation: 445 R Axis:   45 Text Interpretation: Sinus tachycardia Otherwise normal ECG No acute changes No significant change since last tracing Confirmed by Varney Biles [phone]) on 01/15/2021 10:40:00 PM  Radiology DG Neck Soft Tissue  Result Date: 01/15/2021 CLINICAL  DATA:  Sore throat EXAM: NECK SOFT TISSUES - 1+ VIEW COMPARISON:  None. FINDINGS: There is no evidence of retropharyngeal soft tissue swelling or epiglottic enlargement. The cervical airway is unremarkable and no radio-opaque foreign body identified. IMPRESSION: Negative. Electronically Signed   By: Ulyses Jarred M.D.   On: 01/15/2021 20:14    Procedures Procedures   Medications Ordered in ED Medications  potassium chloride (KLOR-CON) packet 60 mEq (60 mEq Oral Given 01/15/21 2151)  ibuprofen (ADVIL) 100 MG/5ML suspension 600 mg (has no administration in time range)  albuterol (PROVENTIL) (2.5 MG/3ML) 0.083% nebulizer solution 5 mg (5 mg Nebulization Given 01/15/21 1714)  ketorolac (TORADOL) 30 MG/ML injection 15 mg (15 mg Intravenous Given 01/15/21 2017)  dexamethasone (DECADRON) injection 10 mg (10 mg Intravenous Given 01/15/21 2018)  lidocaine (XYLOCAINE) 2 % viscous mouth solution 15 mL (15 mLs Mouth/Throat Given 01/15/21 2018)  sodium chloride 0.9 % bolus 1,000 mL (0 mLs Intravenous Stopped 01/15/21 2115)  penicillin g benzathine (BICILLIN LA) 1200000 UNIT/2ML injection 1.2 Million Units (1.2 Million Units Intramuscular Given 01/15/21 2019)  lactated ringers bolus 1,000 mL (1,000 mLs Intravenous New Bag/Given 01/15/21 2215)  magnesium sulfate IVPB 2 g 50 mL (0 g Intravenous Stopped 01/15/21 2257)  ketorolac (TORADOL) 30 MG/ML injection 15 mg (15 mg Intravenous Given 01/15/21 2212)    ED Course  I have reviewed the triage vital signs and the nursing notes.  Pertinent labs & imaging results that were available during my care of the patient were reviewed by me and considered in my medical decision making (see chart for details).  Clinical Course as of 01/15/21 2326  Nancy Fetter Jan 15, 2021  2325 Nursing staff has observed patient drinking oral potassium and keeping it down.   Patient continues to be calm, tachycardia is improved, x-ray soft tissue neck was reassuring, labs are reassuring. She is stable  for discharge. Strict ER return precautions have been discussed, and patient is agreeing with the plan and is comfortable with the workup done and the recommendations from the ER.  [AN]    Clinical Course User Index [AN] Varney Biles, MD   MDM Rules/Calculators/A&P                           25 year old female comes in with chief complaint of respiratory distress.  She originally came to the ER with sore throat.  While in the waiting room, she started screaming that she was having trouble breathing and was pulled back.  Patient appears to be having a panic attack.  Her O2 sats are 98%.  She is screaming, her lungs are clear to auscultation, there is no stridor and her voice is not muffled.   We tried to calm patient down.  I ordered some Versed and also breathing treatment.  Patient did calm down and did not require Versed.  Labs were ordered given that this is her second visit.  IV fluid ordered.  Her oral exam is overall reassuring besides exudative tonsils.  No evidence of PTA.  Soft tissue neck ordered just to make sure there is no compromise to the deep space of the neck.   11:26 PM Reassessed the patient twice.  She was now resting comfortably on both occasions.  She has received IM penicillin because of her fear of untreated infection.  She has received Toradol, dexamethasone and 1 L of IV fluid.  Still slightly tachycardic.  Potassium is 2.6.  She is getting oral potassium and IV potassium.  I ordered another liter of fluid.  We will test patient for GC and chlamydia via the throat swab.  I have reviewed the x-rays with the patient.  She continues to have no respiratory distress anymore.  We are still awaiting urine sample.    Final Clinical Impression(s) / ED Diagnoses Final diagnoses:  Tonsillitis    Rx / DC Orders ED Discharge Orders          Ordered    ibuprofen (CHILDRENS IBUPROFEN) 100 MG/5ML suspension  Every 6 hours PRN        01/15/21 2324              Varney Biles, MD 01/15/21 2326

## 2021-01-15 NOTE — ED Notes (Signed)
Pt was able to swallow and keep down some of the potassium packet

## 2021-01-15 NOTE — ED Triage Notes (Signed)
Patient here from home reporting sore throat. Diagnosed with pharyngitis on 11/4. Patient current screaming in triage.

## 2021-01-15 NOTE — ED Notes (Addendum)
Pt threw breathing treatment on the floor and started screaming again pt stated it wasn't helping

## 2021-01-16 LAB — GC/CHLAMYDIA PROBE AMP (~~LOC~~) NOT AT ARMC
Chlamydia: NEGATIVE
Comment: NEGATIVE
Comment: NORMAL
Neisseria Gonorrhea: NEGATIVE

## 2021-02-27 ENCOUNTER — Ambulatory Visit: Payer: Medicaid Other | Admitting: Family Medicine

## 2021-02-27 NOTE — Progress Notes (Deleted)
Err

## 2021-03-10 ENCOUNTER — Encounter (HOSPITAL_COMMUNITY): Payer: Self-pay | Admitting: Obstetrics & Gynecology

## 2021-03-10 ENCOUNTER — Inpatient Hospital Stay (HOSPITAL_COMMUNITY): Payer: Medicaid Other

## 2021-03-10 ENCOUNTER — Other Ambulatory Visit: Payer: Self-pay

## 2021-03-10 ENCOUNTER — Inpatient Hospital Stay (HOSPITAL_COMMUNITY)
Admission: AD | Admit: 2021-03-10 | Discharge: 2021-03-10 | Disposition: A | Discharge status: Home / Self Care | Payer: Medicaid Other | Attending: Obstetrics & Gynecology | Admitting: Obstetrics & Gynecology

## 2021-03-10 DIAGNOSIS — O26899 Other specified pregnancy related conditions, unspecified trimester: Secondary | ICD-10-CM

## 2021-03-10 DIAGNOSIS — O3680X0 Pregnancy with inconclusive fetal viability, not applicable or unspecified: Secondary | ICD-10-CM | POA: Insufficient documentation

## 2021-03-10 DIAGNOSIS — Z3A01 Less than 8 weeks gestation of pregnancy: Secondary | ICD-10-CM | POA: Insufficient documentation

## 2021-03-10 DIAGNOSIS — O26891 Other specified pregnancy related conditions, first trimester: Secondary | ICD-10-CM | POA: Insufficient documentation

## 2021-03-10 DIAGNOSIS — R109 Unspecified abdominal pain: Secondary | ICD-10-CM | POA: Diagnosis present

## 2021-03-10 LAB — CBC
HCT: 35.4 % — ABNORMAL LOW (ref 36.0–46.0)
Hemoglobin: 11.6 g/dL — ABNORMAL LOW (ref 12.0–15.0)
MCH: 26.7 pg (ref 26.0–34.0)
MCHC: 32.8 g/dL (ref 30.0–36.0)
MCV: 81.6 fL (ref 80.0–100.0)
Platelets: 249 10*3/uL (ref 150–400)
RBC: 4.34 MIL/uL (ref 3.87–5.11)
RDW: 16 % — ABNORMAL HIGH (ref 11.5–15.5)
WBC: 8.5 10*3/uL (ref 4.0–10.5)
nRBC: 0 % (ref 0.0–0.2)

## 2021-03-10 LAB — HCG, QUANTITATIVE, PREGNANCY: hCG, Beta Chain, Quant, S: 55 m[IU]/mL — ABNORMAL HIGH (ref <5)

## 2021-03-10 LAB — URINALYSIS, ROUTINE W REFLEX MICROSCOPIC
Bilirubin Urine: NEGATIVE
Glucose, UA: NEGATIVE mg/dL
Hgb urine dipstick: NEGATIVE
Ketones, ur: NEGATIVE mg/dL
Leukocytes,Ua: NEGATIVE
Nitrite: NEGATIVE
Protein, ur: NEGATIVE mg/dL
Specific Gravity, Urine: 1.021 (ref 1.005–1.030)
pH: 6 (ref 5.0–8.0)

## 2021-03-10 LAB — WET PREP, GENITAL
Clue Cells Wet Prep HPF POC: NONE SEEN
Sperm: NONE SEEN
Trich, Wet Prep: NONE SEEN
WBC, Wet Prep HPF POC: <10 (ref <10)
Yeast Wet Prep HPF POC: NONE SEEN

## 2021-03-10 LAB — POCT PREGNANCY, URINE: Preg Test, Ur: POSITIVE — AB

## 2021-03-10 NOTE — MAU Provider Note (Signed)
History     CSN: [id number]  Arrival date and time: 03/10/21 1151   Event Date/Time   First Provider Initiated Contact with Patient 03/10/21 1232      Chief Complaint  Patient presents with   Abdominal Pain   HPI Leslie Skinner is a 25 y.o. G3P2002 at [redacted]w[redacted]d who presents with lower abdominal pain and possible pregnancy. She reports for 2-3 weeks she has been having sharp intermittent abdominal pain. She denies any pain at this time but when it comes, she rates the pain a 6/10. She has not tried any medication for the pain. She denies any bleeding or abnormal discharge. She has not taken a pregnancy test before her arrival to MAU.  OB History     Gravida  3   Para  2   Term  2   Preterm      AB      Living  2      SAB      IAB      Ectopic      Multiple  0   Live Births  2           Past Medical History:  Diagnosis Date   Acanthosis nigricans, acquired    Constipation    Diabetes mellitus type II    Young age around 27 per pt   GERD (gastroesophageal reflux disease)    Goiter    Hypertension    Migraines    Obesity    Osteochondroma of lower leg    Pollen allergies    Pregnancy induced hypertension    Scoliosis    Spinal headache     Past Surgical History:  Procedure Laterality Date   LOWER LEG SOFT TISSUE TUMOR EXCISION  2011   OSTEOCHONDROMA EXCISION      Family History  Problem Relation Age of Onset   Diabetes Mother    Ulcers Mother    Thyroid disease Mother    Cancer Mother    Obesity Mother    Heart disease Mother    Heart attack Mother        caused death    Diabetes Maternal Grandmother    Stroke Maternal Grandmother    Ulcers Maternal Grandmother    Diabetes Maternal Grandfather    GER disease Maternal Grandfather    Heart disease Maternal Grandfather    GER disease Sister    Diabetes Father    GER disease Father    Obesity Sister    Diabetes Paternal Grandmother    Thyroid disease Paternal Grandmother    Diabetes  Paternal Grandfather    Heart disease Maternal Aunt    Diabetes Maternal Aunt    Thyroid disease Maternal Aunt    Cancer Maternal Aunt    Obesity Maternal Aunt    GER disease Maternal Aunt     Social History   Tobacco Use   Smoking status: Never    Passive exposure: Yes   Smokeless tobacco: Never  Vaping Use   Vaping Use: Never used  Substance Use Topics   Alcohol use: No   Drug use: No    Allergies:  Allergies  Allergen Reactions   Morphine And Related Other (See Comments)    Hot flashes, can't breathe   Tramadol Other (See Comments)    Hot flashes     No medications prior to admission.    Review of Systems  Constitutional: Negative.  Negative for fatigue and fever.  HENT: Negative.    Respiratory:  Negative.  Negative for shortness of breath.   Cardiovascular: Negative.  Negative for chest pain.  Gastrointestinal:  Positive for abdominal pain. Negative for constipation, diarrhea, nausea and vomiting.  Genitourinary: Negative.  Negative for dysuria, vaginal bleeding and vaginal discharge.  Neurological: Negative.  Negative for dizziness and headaches.  Physical Exam   Blood pressure 135/73, pulse 86, temperature 98.7 F (37.1 C), temperature source Oral, resp. rate 17, height 5\' 2"  (1.575 m), weight 107.2 kg, last menstrual period 02/06/2021, SpO2 100 %, not currently breastfeeding.  Physical Exam Vitals and nursing note reviewed.  Constitutional:      General: She is not in acute distress.    Appearance: She is well-developed.  HENT:     Head: Normocephalic.  Eyes:     Pupils: Pupils are equal, round, and reactive to light.  Cardiovascular:     Rate and Rhythm: Normal rate and regular rhythm.     Heart sounds: Normal heart sounds.  Pulmonary:     Effort: Pulmonary effort is normal. No respiratory distress.     Breath sounds: Normal breath sounds.  Abdominal:     General: Bowel sounds are normal. There is no distension.     Palpations: Abdomen is soft.      Tenderness: There is no abdominal tenderness.  Skin:    General: Skin is warm and dry.  Neurological:     Mental Status: She is alert and oriented to person, place, and time.  Psychiatric:        Mood and Affect: Mood normal.        Behavior: Behavior normal.        Thought Content: Thought content normal.        Judgment: Judgment normal.    MAU Course  Procedures Results for orders placed or performed during the hospital encounter of 03/10/21 (from the past 24 hour(s))  Pregnancy, urine POC     Status: Abnormal   Collection Time: 03/10/21 12:07 PM  Result Value Ref Range   Preg Test, Ur POSITIVE (A) NEGATIVE  Urinalysis, Routine w reflex microscopic Urine, Clean Catch     Status: None   Collection Time: 03/10/21 12:14 PM  Result Value Ref Range   Color, Urine YELLOW YELLOW   APPearance CLEAR CLEAR   Specific Gravity, Urine 1.021 1.005 - 1.030   pH 6.0 5.0 - 8.0   Glucose, UA NEGATIVE NEGATIVE mg/dL   Hgb urine dipstick NEGATIVE NEGATIVE   Bilirubin Urine NEGATIVE NEGATIVE   Ketones, ur NEGATIVE NEGATIVE mg/dL   Protein, ur NEGATIVE NEGATIVE mg/dL   Nitrite NEGATIVE NEGATIVE   Leukocytes,Ua NEGATIVE NEGATIVE  CBC     Status: Abnormal   Collection Time: 03/10/21 12:36 PM  Result Value Ref Range   WBC 8.5 4.0 - 10.5 K/uL   RBC 4.34 3.87 - 5.11 MIL/uL   Hemoglobin 11.6 (L) 12.0 - 15.0 g/dL   HCT 35.4 (L) 36.0 - 46.0 %   MCV 81.6 80.0 - 100.0 fL   MCH 26.7 26.0 - 34.0 pg   MCHC 32.8 30.0 - 36.0 g/dL   RDW 16.0 (H) 11.5 - 15.5 %   Platelets 249 150 - 400 K/uL   nRBC 0.0 0.0 - 0.2 %  hCG, quantitative, pregnancy     Status: Abnormal   Collection Time: 03/10/21 12:36 PM  Result Value Ref Range   hCG, Beta Chain, Quant, S 55 (H) <5 mIU/mL  Wet prep, genital     Status: None   Collection Time: 03/10/21  12:59 PM   Specimen: Vaginal  Result Value Ref Range   Yeast Wet Prep HPF POC NONE SEEN NONE SEEN   Trich, Wet Prep NONE SEEN NONE SEEN   Clue Cells Wet Prep HPF  POC NONE SEEN NONE SEEN   WBC, Wet Prep HPF POC <10 <10   Sperm NONE SEEN     US OB LESS THAN 14 WEEKS WITH OB TRANSVAGINAL  Result Date: 03/10/2021 CLINICAL DATA:  Abdominal pain, pregnancy. EXAM: OBSTETRIC <14 WK [place] AND TRANSVAGINAL OB US TECHNIQUE: Both transabdominal and transvaginal ultrasound examinations were performed for complete evaluation of the gestation as well as the maternal uterus, adnexal regions, and pelvic cul-de-sac. Transvaginal technique was performed to assess early pregnancy. COMPARISON:  None. FINDINGS: Intrauterine gestational sac: None Yolk sac:  Not Visualized. Embryo:  Not Visualized. Cardiac Activity: Not Visualized. Subchorionic hemorrhage:  None visualized. Maternal uterus/adnexae: Ovaries are unremarkable. Small amount of free fluid is noted which most likely is physiologic. IMPRESSION: No intrauterine gestational sac, yolk sac, fetal pole, or cardiac activity visualized. Differential considerations include intrauterine gestation too early to be sonographically visualized, spontaneous abortion, or ectopic pregnancy. Consider follow-up ultrasound in 14 days and serial quantitative beta HCG follow-up. Electronically Signed   By: Marijo Conception M.D.   On: 03/10/2021 14:19     MDM UA, UPT CBC, HCG ABO/Rh- A Pos Wet prep and gc/chlamydia US OB Comp Less 14 weeks with Transvaginal  Discussed with client the diagnosis of pregnancy of unknown anatomic location.  Three possibilities of outcome are: a healthy pregnancy that is too early to see a yolk sac to confirm the pregnancy is in the uterus, a pregnancy that is not healthy and has not developed and will not develop, and an ectopic pregnancy that is in the abdomen that cannot be identified at this time.  And ectopic pregnancy can be a life threatening situation as a pregnancy needs to be in the uterus which is a muscle and can stretch to accommodate the growth of a pregnancy.  Other structures in the pelvis and abdomen as  not muscular and do not stretch with the growth of a pregnancy.  Worst case scenario is that a structure ruptures with a growing pregnancy not in the uterus and and internal hemorrhage can be a life threatening situation.  We need to follow the progression of this pregnancy carefully.  We need to check another serum pregnancy hormone level to determine if the levels are rising appropriately  and to determine the next steps that are needed for you. Patient's questions were answered.  Will bring patient back to MAU on 1/1 due to office being closed on 1/2 for repeat HCG  Assessment and Plan   1. Pregnancy of unknown anatomic location   2. Abdominal pain affecting pregnancy   3. [redacted] weeks gestation of pregnancy    -Discharge home in stable condition -Strict ectopic precautions discussed -Patient advised to follow-up with MAU on 1/1 for repeat HCG -Patient may return to MAU as needed or if her condition were to change or worsen   Wende Mott CNM 03/10/2021, 4:35 PM

## 2021-03-10 NOTE — MAU Note (Addendum)
...  Leslie Skinner is a 25 y.o. at Unknown here in MAU reporting: Wants to confirm pregnancy. She states she took two tests last night that were faint positive. Denies vaginal bleeding, or abnormal discharge. Endorses intermittent lower abdominal pain that is sharp that has been occurring for two to three weeks. Last felt this pain walking into MAU.  Unsure of LMP. She thinks she maybe had a period last month. IUD removed in August.  +UPT in MAU.  Pain score: 5/10 lower abdominal pain

## 2021-03-12 ENCOUNTER — Inpatient Hospital Stay (HOSPITAL_COMMUNITY)
Admission: AD | Admit: 2021-03-12 | Discharge: 2021-03-12 | Disposition: A | Discharge status: Home / Self Care | Payer: Medicaid Other | Attending: Obstetrics and Gynecology | Admitting: Obstetrics and Gynecology

## 2021-03-12 ENCOUNTER — Other Ambulatory Visit (HOSPITAL_COMMUNITY)
Admit: 2021-03-12 | Discharge: 2021-03-12 | Disposition: A | Discharge status: Home / Self Care | Payer: Medicaid Other | Attending: Obstetrics and Gynecology | Admitting: Obstetrics and Gynecology

## 2021-03-12 ENCOUNTER — Encounter (HOSPITAL_COMMUNITY): Payer: Self-pay | Admitting: Obstetrics and Gynecology

## 2021-03-12 ENCOUNTER — Other Ambulatory Visit: Payer: Self-pay

## 2021-03-12 DIAGNOSIS — O3680X0 Pregnancy with inconclusive fetal viability, not applicable or unspecified: Secondary | ICD-10-CM

## 2021-03-12 DIAGNOSIS — R102 Pelvic and perineal pain: Secondary | ICD-10-CM | POA: Diagnosis present

## 2021-03-12 DIAGNOSIS — Z3A01 Less than 8 weeks gestation of pregnancy: Secondary | ICD-10-CM | POA: Diagnosis not present

## 2021-03-12 DIAGNOSIS — O26891 Other specified pregnancy related conditions, first trimester: Secondary | ICD-10-CM | POA: Insufficient documentation

## 2021-03-12 LAB — HCG, QUANTITATIVE, PREGNANCY: hCG, Beta Chain, Quant, S: 92 m[IU]/mL — ABNORMAL HIGH (ref <5)

## 2021-03-12 NOTE — MAU Note (Signed)
Presents for HCG level.  Denies pain or VB.

## 2021-03-12 NOTE — L&D Delivery Note (Signed)
[facility] Delivery Note  Leslie Skinner is a 26 y.o. G3P2002 s/p SVD at [redacted]w[redacted]d She was admitted for IOL for cHTN.   SROM: 4h 043mith clear fluid GBS Status: Negative Negative/-- (09/28 1326) Maximum Maternal Temperature: 98.4 Temp (24hrs), Avg:98 F (36.7 C), Min:97.7 F (36.5 C), Max:98.4 F (36.9 C)    Labor Progress: Patient arrived at 4 cm dilation and was induced with none.   Delivery Date/Time: 12/30/2021 at 0319 Delivery: Called to room and patient was complete and pushing. Head delivered in direct OA position. Tight nuchal cord present. Anterior shoulder dystocia present. Delivered using McRoberts maneuver. Body delivered in usual fashion. Heavy terminal meconium. Infant with spontaneous cry, placed on mother's abdomen, dried and stimulated. Cord clamped x 2 after 1-minute delay, and cut by FOB. Cord blood drawn. Placenta delivered spontaneously with gentle cord traction. Fundus firm with massage and TXA. Labia, perineum, vagina, and cervix inspected with few superficial abrasions. No tearing.   Placenta: In tact Complications: Shoulder dystocia, nuchal cord Lacerations: None EBL: 321 Analgesia: Epidural   Infant: APGAR (1 MIN):  7 APGAR (5 MINS):  8 APGAR (10 MINS):  pending  Weight: pending  AkLowry RamMD  PGY-1, [facility]  12/30/2021 3:46 AM

## 2021-03-12 NOTE — MAU Provider Note (Signed)
History   Chief Complaint:  HCG Level   Leslie Skinner is  26 y.o. [id number] Patient's last menstrual period was 02/06/2021 (within days).. Patient is here for follow up of quantitative HCG and ongoing surveillance of pregnancy status. She is [redacted]w[redacted]d weeks gestation by LMP.    Since her last visit, the patient is without new complaint. The patient reports bleeding as  none now.  She reports occasional mild cramps.  General ROS:  negative  Her previous Quantitative HCG values are: 55     Physical Exam   Blood pressure 133/72, pulse 93, temperature 97.8 F (36.6 C), temperature source Oral, resp. rate 18, height 5\' 2"  (1.575 m), weight 106.9 kg, last menstrual period 02/06/2021, SpO2 98 %, not currently breastfeeding.  Focused Gynecological Exam: examination not indicated  Labs: Results for orders placed or performed during the hospital encounter of 03/12/21 (from the past 24 hour(s))  hCG, quantitative, pregnancy   Collection Time: 03/12/21  9:19 AM  Result Value Ref Range   hCG, Beta Chain, Quant, S 92 (H) <5 mIU/mL    Ultrasound Studies:   US OB LESS THAN 14 WEEKS WITH OB TRANSVAGINAL  Result Date: 03/10/2021 CLINICAL DATA:  Abdominal pain, pregnancy. EXAM: OBSTETRIC <14 WK [place] AND TRANSVAGINAL OB US TECHNIQUE: Both transabdominal and transvaginal ultrasound examinations were performed for complete evaluation of the gestation as well as the maternal uterus, adnexal regions, and pelvic cul-de-sac. Transvaginal technique was performed to assess early pregnancy. COMPARISON:  None. FINDINGS: Intrauterine gestational sac: None Yolk sac:  Not Visualized. Embryo:  Not Visualized. Cardiac Activity: Not Visualized. Subchorionic hemorrhage:  None visualized. Maternal uterus/adnexae: Ovaries are unremarkable. Small amount of free fluid is noted which most likely is physiologic. IMPRESSION: No intrauterine gestational sac, yolk sac, fetal pole, or cardiac activity visualized. Differential  considerations include intrauterine gestation too early to be sonographically visualized, spontaneous abortion, or ectopic pregnancy. Consider follow-up ultrasound in 14 days and serial quantitative beta HCG follow-up. Electronically Signed   By: Marijo Conception M.D.   On: 03/10/2021 14:19    Assessment:   1. Pregnancy of unknown anatomic location   2. [redacted] weeks gestation of pregnancy       Plan: -Discharge home in stable condition -Ectopic precautions discussed -Patient advised to follow-up with [facility] on Tuesday 03/14/2021 at 0830 for rpt HCG -Patient may return to MAU as needed or if her condition were to change or worsen  Laury Deep, CNM 03/12/2021, 9:29 AM

## 2021-03-14 ENCOUNTER — Ambulatory Visit (INDEPENDENT_AMBULATORY_CARE_PROVIDER_SITE_OTHER): Payer: Self-pay

## 2021-03-14 ENCOUNTER — Other Ambulatory Visit: Payer: Self-pay

## 2021-03-14 VITALS — BP 139/86 | HR 86 | Wt 237.1 lb

## 2021-03-14 DIAGNOSIS — O3680X0 Pregnancy with inconclusive fetal viability, not applicable or unspecified: Secondary | ICD-10-CM

## 2021-03-14 LAB — GC/CHLAMYDIA PROBE AMP (~~LOC~~) NOT AT ARMC
Chlamydia: NEGATIVE
Comment: NEGATIVE
Comment: NORMAL
Neisseria Gonorrhea: NEGATIVE

## 2021-03-14 LAB — BETA HCG QUANT (REF LAB): hCG Quant: 108 m[IU]/mL

## 2021-03-14 NOTE — Progress Notes (Signed)
Pt here today for STAT beta s/p pregnancy unknown location. Pt denies VB and pain.   Pt reports that she is here today to get hormone levels checked.  I informed pt that she is getting her pregnancy hormone level drawn to see if her levels increase as they should.  Pt explained that I will call her with results and f/u.  Pt verbalized understanding with no further questions.   Received beta results of 108.  Reviewed results with Dr. Dione Plover who recommends that pt come in 48 hours for another STAT Beta.  Notified pt provider's recommendation.  Pt endorses that she will be able to come in on 03/16/21 at 0830.  [place] office notified.    Mel Almond, RN  03/14/21

## 2021-03-16 ENCOUNTER — Other Ambulatory Visit: Payer: Self-pay

## 2021-03-16 ENCOUNTER — Ambulatory Visit (INDEPENDENT_AMBULATORY_CARE_PROVIDER_SITE_OTHER): Payer: Medicaid Other

## 2021-03-16 VITALS — BP 131/88 | HR 101 | Wt 235.1 lb

## 2021-03-16 DIAGNOSIS — O3680X0 Pregnancy with inconclusive fetal viability, not applicable or unspecified: Secondary | ICD-10-CM

## 2021-03-16 LAB — BETA HCG QUANT (REF LAB): hCG Quant: 120 m[IU]/mL

## 2021-03-16 NOTE — Progress Notes (Signed)
Beta HCG Follow-up Visit  Leslie Skinner presents to [facility] for follow-up beta HCG lab. She was seen in office on 03/14/21 for follow up beta HCG for on MAU for vaginal bleeding and abdominal pain during early pregnancy without confirmed IUP. Patient denies vaginal bleeding or abdominal pain today. Discussed with patient that we are following beta HCG levels today. Valid contact number for patient confirmed. I will call the patient with results.   Beta HCG results: 03/10/21 @ 3818 29  03/12/21 @ 9371 69  03/14/21 @ 0931 108  03/16/21 @ 6789 120   Results and patient history reviewed with [name], CNM, who consulted with [name], MD for recommendation. Provider states this does not appear to be a viable pregnancy. Recommends repeat ultrasound on 03/21/21 to confirm location of pregnancy. Patient called and informed of plan for follow-up. Reviewed ectopic precautions with patient.  [name] 03/16/2021 9:14 AM

## 2021-03-20 ENCOUNTER — Inpatient Hospital Stay (HOSPITAL_COMMUNITY)
Admission: AD | Admit: 2021-03-20 | Discharge: 2021-03-20 | Disposition: A | Discharge status: Home / Self Care | Payer: Medicaid Other | Attending: Obstetrics & Gynecology | Admitting: Obstetrics & Gynecology

## 2021-03-20 ENCOUNTER — Inpatient Hospital Stay (HOSPITAL_COMMUNITY): Payer: Medicaid Other

## 2021-03-20 ENCOUNTER — Encounter (HOSPITAL_COMMUNITY): Payer: Self-pay | Admitting: Obstetrics & Gynecology

## 2021-03-20 DIAGNOSIS — O26891 Other specified pregnancy related conditions, first trimester: Secondary | ICD-10-CM | POA: Insufficient documentation

## 2021-03-20 DIAGNOSIS — O161 Unspecified maternal hypertension, first trimester: Secondary | ICD-10-CM | POA: Insufficient documentation

## 2021-03-20 DIAGNOSIS — I10 Essential (primary) hypertension: Secondary | ICD-10-CM

## 2021-03-20 DIAGNOSIS — R109 Unspecified abdominal pain: Secondary | ICD-10-CM | POA: Insufficient documentation

## 2021-03-20 DIAGNOSIS — Z3A01 Less than 8 weeks gestation of pregnancy: Secondary | ICD-10-CM | POA: Diagnosis not present

## 2021-03-20 DIAGNOSIS — N939 Abnormal uterine and vaginal bleeding, unspecified: Secondary | ICD-10-CM

## 2021-03-20 DIAGNOSIS — O039 Complete or unspecified spontaneous abortion without complication: Secondary | ICD-10-CM | POA: Diagnosis not present

## 2021-03-20 LAB — COMPREHENSIVE METABOLIC PANEL
ALT: 14 U/L (ref 0–44)
AST: 18 U/L (ref 15–41)
Albumin: 3.5 g/dL (ref 3.5–5.0)
Alkaline Phosphatase: 90 U/L (ref 38–126)
Anion gap: 5 (ref 5–15)
BUN: 13 mg/dL (ref 6–20)
CO2: 26 mmol/L (ref 22–32)
Calcium: 8.7 mg/dL — ABNORMAL LOW (ref 8.9–10.3)
Chloride: 103 mmol/L (ref 98–111)
Creatinine, Ser: 0.83 mg/dL (ref 0.44–1.00)
GFR, Estimated: >60 mL/min (ref >60)
Glucose, Bld: 101 mg/dL — ABNORMAL HIGH (ref 70–99)
Potassium: 3.5 mmol/L (ref 3.5–5.1)
Sodium: 134 mmol/L — ABNORMAL LOW (ref 135–145)
Total Bilirubin: 0.6 mg/dL (ref 0.3–1.2)
Total Protein: 6.9 g/dL (ref 6.5–8.1)

## 2021-03-20 LAB — CBC
HCT: 36.3 % (ref 36.0–46.0)
Hemoglobin: 12.1 g/dL (ref 12.0–15.0)
MCH: 27 pg (ref 26.0–34.0)
MCHC: 33.3 g/dL (ref 30.0–36.0)
MCV: 81 fL (ref 80.0–100.0)
Platelets: 256 10*3/uL (ref 150–400)
RBC: 4.48 MIL/uL (ref 3.87–5.11)
RDW: 15.6 % — ABNORMAL HIGH (ref 11.5–15.5)
WBC: 8.1 10*3/uL (ref 4.0–10.5)
nRBC: 0 % (ref 0.0–0.2)

## 2021-03-20 LAB — HCG, QUANTITATIVE, PREGNANCY: hCG, Beta Chain, Quant, S: 82 m[IU]/mL — ABNORMAL HIGH (ref <5)

## 2021-03-20 MED ORDER — NIFEDIPINE ER OSMOTIC RELEASE 30 MG PO TB24
30.0000 mg | ORAL_TABLET | Freq: Every day | ORAL | 1 refills | Status: DC
Start: 2021-03-20 — End: 2021-07-13

## 2021-03-20 NOTE — MAU Provider Note (Signed)
Patient ANNJANETTE WERTENBERGER is a 26 y.o. G3P2002  At [redacted]w[redacted]d here with complaints of vaginal bleeding that started this morning. She denies abdominal or vaginal pain, she denies fever, chest pain, SOB. She denies nausea/vomiting. She has been followed for PUL; has [place] shceduled for tomorrow. She started spotting this morning and became concerned so she came in.  History     CSN: [id number]  Arrival date and time: 03/20/21 1035   Event Date/Time   First Provider Initiated Contact with Patient 03/20/21 1138      Chief Complaint  Patient presents with   Abdominal Pain   Vaginal Bleeding [name] primary symptoms include vaginal bleeding. This is a new problem. The current episode started today. The problem has been unchanged. The patient is experiencing no pain. Pertinent negatives include no abdominal pain, back pain, chills, constipation, diarrhea, dysuria or fever. The vaginal discharge was bloody. The vaginal bleeding is typical of menses. She has not been passing clots. She has not been passing tissue. Nothing aggravates the symptoms.   OB History     Gravida  3   Para  2   Term  2   Preterm      AB      Living  2      SAB      IAB      Ectopic      Multiple  0   Live Births  2           Past Medical History:  Diagnosis Date   Acanthosis nigricans, acquired    Constipation    Diabetes mellitus type II    Young age around 52 per pt   GERD (gastroesophageal reflux disease)    Goiter    Hypertension    IUD contraception 01/28/2020   Migraines    Obesity    Osteochondroma of lower leg    Pollen allergies    Pregnancy induced hypertension    Scoliosis    Spinal headache     Past Surgical History:  Procedure Laterality Date   LOWER LEG SOFT TISSUE TUMOR EXCISION  2011   OSTEOCHONDROMA EXCISION      Family History  Problem Relation Age of Onset   Diabetes Mother    Ulcers Mother    Thyroid disease Mother    Cancer Mother    Obesity Mother     Heart disease Mother    Heart attack Mother        caused death    Diabetes Maternal Grandmother    Stroke Maternal Grandmother    Ulcers Maternal Grandmother    Diabetes Maternal Grandfather    GER disease Maternal Grandfather    Heart disease Maternal Grandfather    GER disease Sister    Diabetes Father    GER disease Father    Obesity Sister    Diabetes Paternal Grandmother    Thyroid disease Paternal Grandmother    Diabetes Paternal Grandfather    Heart disease Maternal Aunt    Diabetes Maternal Aunt    Thyroid disease Maternal Aunt    Cancer Maternal Aunt    Obesity Maternal Aunt    GER disease Maternal Aunt     Social History   Tobacco Use   Smoking status: Never    Passive exposure: Yes   Smokeless tobacco: Never  Vaping Use   Vaping Use: Never used  Substance Use Topics   Alcohol use: No   Drug use: No    Allergies:  Allergies  Allergen Reactions   Morphine And Related Other (See Comments)    Hot flashes, can't breathe   Tramadol Other (See Comments)    Hot flashes     Medications Prior to Admission  Medication Sig Dispense Refill Last Dose   acetaminophen (TYLENOL) 325 MG tablet Take 2 tablets (650 mg total) by mouth every 6 (six) hours as needed (for pain scale < 4). 30 tablet 0    Blood Pressure Monitoring (BLOOD PRESSURE MONITOR AUTOMAT) DEVI 1 Device by Does not apply route daily. Automatic Blood pressure monitor with regular/large cuff. To monitor blood pressure at home on regular bases. ICD-10 Code: O09.90. 1 each 0     Review of Systems  Constitutional:  Negative for chills and fever.  Gastrointestinal:  Negative for abdominal pain, constipation and diarrhea.  Genitourinary:  Positive for vaginal bleeding. Negative for dysuria.  Musculoskeletal:  Negative for back pain.  Physical Exam   Blood pressure (!) 143/78, pulse 82, temperature 98.6 F (37 C), temperature source Oral, resp. rate 18, height 5\' 2"  (1.575 m), weight 106.2 kg, last  menstrual period 02/06/2021, SpO2 100 %, not currently breastfeeding.  Physical Exam Constitutional:      Appearance: She is well-developed.  Abdominal:     General: Abdomen is flat.     Palpations: Abdomen is soft.     Tenderness: There is no abdominal tenderness.  Genitourinary:    Vagina: Normal.     Cervix: Normal.     Adnexa: Right adnexa normal.     Comments: NEFG; dark red blood in the vagina; no clots, no tissues, no tenderness on exam Skin:    General: Skin is warm and dry.  Neurological:     General: No focal deficit present.     Mental Status: She is alert.    MAU Course  Procedures  MDM -Patient US shows no IUP; unclear if PUL vs. Ectopic vs. Early pregnancy; however bHCG has dropped from 120 to 82, confirming miscarriage in process -Blood type is a A pos -patient with mild bleeding while in MAU, no other complaints. She has been resting while in MAU.  -Patient's BP is elevated, review of chart shows that patient has a history of CHTN. She reports no pain at this moment and did not know that she had high blood pressure in the past.   Assessment and Plan   1. Miscarriage   2. Vaginal bleeding   3. Chronic hypertension   -Discussed with Dr. Nehemiah Settle, patient stable for discharge with recommendation to start  Nifedipine 30 mg XL.  -long discussion with patient about blood pressure and need to go to PCP;  she verbalized understanding.  -bleeding and infection prevention reviewed, including when to return to MAU. Reviewed that more bleeding is expected but that it should begin to taper off. Advised her to refrain from intercourse. Patient and partner verbalized understanding.  -message sent to clinic to schedule patient for non-stat bHCG labs weekly until less than 5 -message sent to clinic to schedule patient for follow up for SAB in two weeks   Mervyn Skeeters Nichelle Renwick 03/20/2021, 11:41 AM

## 2021-03-20 NOTE — MAU Note (Signed)
Woke up this morning and was bleeding really bad.  Having no pain. No pad on, needs one. Has been having hormone levels followed.

## 2021-03-21 ENCOUNTER — Ambulatory Visit: Payer: Medicaid Other

## 2021-03-27 ENCOUNTER — Ambulatory Visit: Payer: Medicaid Other

## 2021-03-27 ENCOUNTER — Other Ambulatory Visit: Payer: Self-pay | Admitting: *Deleted

## 2021-03-27 DIAGNOSIS — O039 Complete or unspecified spontaneous abortion without complication: Secondary | ICD-10-CM

## 2021-03-29 ENCOUNTER — Other Ambulatory Visit: Payer: Self-pay

## 2021-03-29 ENCOUNTER — Ambulatory Visit: Payer: Medicaid Other

## 2021-03-29 DIAGNOSIS — O039 Complete or unspecified spontaneous abortion without complication: Secondary | ICD-10-CM

## 2021-03-30 LAB — BETA HCG QUANT (REF LAB): hCG Quant: <1 m[IU]/mL

## 2021-04-03 ENCOUNTER — Ambulatory Visit: Payer: Medicaid Other | Admitting: Obstetrics and Gynecology

## 2021-04-03 ENCOUNTER — Encounter: Payer: Self-pay | Admitting: Obstetrics and Gynecology

## 2021-04-03 NOTE — Progress Notes (Signed)
Patient did not keep her GYN follow up appointment for 04/03/2021.  Durene Romans MD Attending Center for [employer] farm manager)

## 2021-05-08 ENCOUNTER — Encounter (HOSPITAL_COMMUNITY): Payer: Self-pay | Admitting: Obstetrics & Gynecology

## 2021-05-08 ENCOUNTER — Inpatient Hospital Stay (HOSPITAL_COMMUNITY): Payer: Medicaid Other

## 2021-05-08 ENCOUNTER — Inpatient Hospital Stay (HOSPITAL_COMMUNITY)
Admission: AD | Admit: 2021-05-08 | Discharge: 2021-05-08 | Disposition: A | Discharge status: Home / Self Care | Payer: Medicaid Other | Attending: Family Medicine | Admitting: Family Medicine

## 2021-05-08 ENCOUNTER — Other Ambulatory Visit: Payer: Self-pay

## 2021-05-08 DIAGNOSIS — O09291 Supervision of pregnancy with other poor reproductive or obstetric history, first trimester: Secondary | ICD-10-CM | POA: Diagnosis not present

## 2021-05-08 DIAGNOSIS — R109 Unspecified abdominal pain: Secondary | ICD-10-CM | POA: Diagnosis not present

## 2021-05-08 DIAGNOSIS — Z3A01 Less than 8 weeks gestation of pregnancy: Secondary | ICD-10-CM | POA: Diagnosis not present

## 2021-05-08 DIAGNOSIS — O26899 Other specified pregnancy related conditions, unspecified trimester: Secondary | ICD-10-CM

## 2021-05-08 DIAGNOSIS — E669 Obesity, unspecified: Secondary | ICD-10-CM | POA: Diagnosis not present

## 2021-05-08 DIAGNOSIS — O99211 Obesity complicating pregnancy, first trimester: Secondary | ICD-10-CM | POA: Diagnosis not present

## 2021-05-08 DIAGNOSIS — R1030 Lower abdominal pain, unspecified: Secondary | ICD-10-CM | POA: Diagnosis not present

## 2021-05-08 DIAGNOSIS — Z3491 Encounter for supervision of normal pregnancy, unspecified, first trimester: Secondary | ICD-10-CM

## 2021-05-08 DIAGNOSIS — O26891 Other specified pregnancy related conditions, first trimester: Secondary | ICD-10-CM | POA: Insufficient documentation

## 2021-05-08 LAB — CBC
HCT: 35.2 % — ABNORMAL LOW (ref 36.0–46.0)
Hemoglobin: 12 g/dL (ref 12.0–15.0)
MCH: 27.4 pg (ref 26.0–34.0)
MCHC: 34.1 g/dL (ref 30.0–36.0)
MCV: 80.4 fL (ref 80.0–100.0)
Platelets: 260 10*3/uL (ref 150–400)
RBC: 4.38 MIL/uL (ref 3.87–5.11)
RDW: 14.7 % (ref 11.5–15.5)
WBC: 9.1 10*3/uL (ref 4.0–10.5)
nRBC: 0 % (ref 0.0–0.2)

## 2021-05-08 LAB — POCT PREGNANCY, URINE: Preg Test, Ur: POSITIVE — AB

## 2021-05-08 LAB — HCG, QUANTITATIVE, PREGNANCY: hCG, Beta Chain, Quant, S: 21328 m[IU]/mL — ABNORMAL HIGH (ref <5)

## 2021-05-08 NOTE — MAU Provider Note (Signed)
History     CSN: [id number]  Arrival date and time: 05/08/21 1705   Event Date/Time   First Provider Initiated Contact with Patient 05/08/21 1730      Chief Complaint  Patient presents with   Abdominal Pain   HPI Leslie Skinner is a 26 y.o. G4P2002 in early pregnancy who presents to MAU with chief complaint of lower abdominal cramping. She is s/p miscarriage in Jan 2023 and states the cramping began shortly afterwards. Sometimes the cramping is suprapubic, sometimes left lateral but discomfort is always mild. She denies vaginal bleeding, dysuria, abdominal tenderness, fever or recent illness.  OB History     Gravida  4   Para  2   Term  2   Preterm      AB      Living  2      SAB      IAB      Ectopic      Multiple  0   Live Births  2           Past Medical History:  Diagnosis Date   Acanthosis nigricans, acquired    Constipation    Diabetes mellitus type II    Young age around 2 per pt   GERD (gastroesophageal reflux disease)    Goiter    Hypertension    IUD contraception 01/28/2020   Migraines    Obesity    Osteochondroma of lower leg    Pollen allergies    Pregnancy induced hypertension    Scoliosis    Spinal headache     Past Surgical History:  Procedure Laterality Date   LOWER LEG SOFT TISSUE TUMOR EXCISION  2011   OSTEOCHONDROMA EXCISION      Family History  Problem Relation Age of Onset   Diabetes Mother    Ulcers Mother    Thyroid disease Mother    Cancer Mother    Obesity Mother    Heart disease Mother    Heart attack Mother        caused death    Diabetes Maternal Grandmother    Stroke Maternal Grandmother    Ulcers Maternal Grandmother    Diabetes Maternal Grandfather    GER disease Maternal Grandfather    Heart disease Maternal Grandfather    GER disease Sister    Diabetes Father    GER disease Father    Obesity Sister    Diabetes Paternal Grandmother    Thyroid disease Paternal Grandmother    Diabetes  Paternal Grandfather    Heart disease Maternal Aunt    Diabetes Maternal Aunt    Thyroid disease Maternal Aunt    Cancer Maternal Aunt    Obesity Maternal Aunt    GER disease Maternal Aunt     Social History   Tobacco Use   Smoking status: Never    Passive exposure: Yes   Smokeless tobacco: Never  Vaping Use   Vaping Use: Never used  Substance Use Topics   Alcohol use: No   Drug use: No    Allergies:  Allergies  Allergen Reactions   Morphine And Related Other (See Comments)    Hot flashes, can't breathe   Tramadol Other (See Comments)    Hot flashes     No medications prior to admission.    Review of Systems  Gastrointestinal:  Positive for abdominal pain.  All other systems reviewed and are negative. Physical Exam   Blood pressure 126/71, pulse 100, temperature 98.9 F (37.2  C), resp. rate 18, unknown if currently breastfeeding.  Physical Exam Vitals and nursing note reviewed. Exam conducted with a chaperone present.  Constitutional:      General: She is not in acute distress.    Appearance: She is obese. She is not ill-appearing.  Cardiovascular:     Rate and Rhythm: Normal rate and regular rhythm.  Pulmonary:     Effort: Pulmonary effort is normal.     Breath sounds: Normal breath sounds.  Abdominal:     Palpations: Abdomen is soft.  Skin:    Capillary Refill: Capillary refill takes less than 2 seconds.  Neurological:     Mental Status: She is alert and oriented to person, place, and time.  Psychiatric:        Mood and Affect: Mood normal.        Behavior: Behavior normal.    MAU Course  Procedures  MDM Patient Vitals for the past 24 hrs:  BP Temp Pulse Resp  05/08/21 1720 126/71 98.9 F (37.2 C) 100 18   Results for orders placed or performed during the hospital encounter of 05/08/21 (from the past 24 hour(s))  Pregnancy, urine POC     Status: Abnormal   Collection Time: 05/08/21  5:26 PM  Result Value Ref Range   Preg Test, Ur POSITIVE  (A) NEGATIVE  CBC     Status: Abnormal   Collection Time: 05/08/21  5:44 PM  Result Value Ref Range   WBC 9.1 4.0 - 10.5 K/uL   RBC 4.38 3.87 - 5.11 MIL/uL   Hemoglobin 12.0 12.0 - 15.0 g/dL   HCT 35.2 (L) 36.0 - 46.0 %   MCV 80.4 80.0 - 100.0 fL   MCH 27.4 26.0 - 34.0 pg   MCHC 34.1 30.0 - 36.0 g/dL   RDW 14.7 11.5 - 15.5 %   Platelets 260 150 - 400 K/uL   nRBC 0.0 0.0 - 0.2 %   US OB LESS THAN 14 WEEKS WITH OB TRANSVAGINAL  Result Date: 05/08/2021 CLINICAL DATA:  Lower abdominal pain.  Pregnant patient. EXAM: OBSTETRIC <14 WK [place] AND TRANSVAGINAL OB US TECHNIQUE: Both transabdominal and transvaginal ultrasound examinations were performed for complete evaluation of the gestation as well as the maternal uterus, adnexal regions, and pelvic cul-de-sac. Transvaginal technique was performed to assess early pregnancy. COMPARISON:  None. FINDINGS: Intrauterine gestational sac: Single Yolk sac:  Visualized. Embryo:  Visualized. Cardiac Activity: Visualized. Heart Rate: 130 bpm MSD:   mm    w     d CRL:  0.58 mm   6 w   2 d                  [place] [facility]: December 30, 2021 Subchorionic hemorrhage:  None visualized. Maternal uterus/adnexae: Normal IMPRESSION: Single live IUP as above. Electronically Signed   By: Dorise Bullion III M.D.   On: 05/08/2021 18:20    Assessment and Plan  --26 y.o. [id number] with live IUP at 6w 2d --No abnormal findings on exam --Patient declines rx for PNV, coordination of [facility] --Discharge home in stable condition with first trimester precautions  Darlina Rumpf, CNM 05/08/2021, 7:05 PM

## 2021-05-08 NOTE — MAU Note (Incomplete)
Pt staed she had a miscarriage last month. BHCG level went to 0 on 1/18.  About 2 weeks ago she started having some breast tenderness and abd cramping. Took a pregnancy test and it was positive. Still having lower abd pain on and off. Denies any vag bleeding or discharge at this time.  Has not had period since miscarriage in January .

## 2021-05-31 ENCOUNTER — Telehealth (INDEPENDENT_AMBULATORY_CARE_PROVIDER_SITE_OTHER): Payer: Medicaid Other

## 2021-05-31 DIAGNOSIS — Z3A00 Weeks of gestation of pregnancy not specified: Secondary | ICD-10-CM

## 2021-05-31 DIAGNOSIS — R110 Nausea: Secondary | ICD-10-CM

## 2021-05-31 DIAGNOSIS — O0990 Supervision of high risk pregnancy, unspecified, unspecified trimester: Secondary | ICD-10-CM

## 2021-05-31 DIAGNOSIS — O09899 Supervision of other high risk pregnancies, unspecified trimester: Secondary | ICD-10-CM

## 2021-05-31 MED ORDER — UNISOM SLEEPTABS 25 MG PO TABS: 25.0000 mg | ORAL_TABLET | Freq: Every evening | ORAL | 0 refills | Status: DC | PRN

## 2021-05-31 MED ORDER — BLOOD PRESSURE MONITORING DEVI: 1.0000 | 0 refills | Status: DC

## 2021-05-31 MED ORDER — VITAMIN B-6 100 MG PO TABS: 100.0000 mg | ORAL_TABLET | Freq: Every day | ORAL | 2 refills | Status: DC

## 2021-05-31 NOTE — Progress Notes (Signed)
Pt advised that she stopped taking her BP medicine of Nifedipine '30mg'$  when she found out that she was pregnant. ?

## 2021-05-31 NOTE — Patient Instructions (Signed)

## 2021-05-31 NOTE — Progress Notes (Signed)
New OB Intake ? ?I connected with  Leslie Skinner on 05/31/21 at 11:15 AM EDT by MyChart Video Visit and verified that I am speaking with the correct person using two identifiers. Nurse is located at [facility] and pt is located at [employer]. ? ?I discussed the limitations, risks, security and privacy concerns of performing an evaluation and management service by telephone and the availability of in person appointments. I also discussed with the patient that there may be a patient responsible charge related to this service. The patient expressed understanding and agreed to proceed. ? ?I explained I am completing New OB Intake today. We discussed her EDD of 12/30/21 that is based on U/S on 05/08/21. Pt is G4/P2. I reviewed her allergies, medications, Medical/Surgical/OB history, and appropriate screenings. I informed her of [facility] services. Based on history, this is a/an  pregnancy complicated by hypertension .  ? ?Patient Active Problem List  ? Diagnosis Date Noted  ? History of postpartum depression 05/03/2019  ? Chronic bilateral thoracic back pain 08/05/2018  ? Current mild episode of major depressive disorder ([place]) 11/14/2017  ? Contraception management 09/30/2014  ? Anxiety state 06/12/2012  ? Pre-diabetes 06/12/2012  ? Obesity, Class III, BMI 40-49.9 (morbid obesity) ([place]) 08/23/2011  ? Scoliosis 11/22/2010  ? Migraine 11/08/2010  ? GERD (gastroesophageal reflux disease)   ? Chronic hypertension during pregnancy, antepartum 05/16/2010  ? ? ?Concerns addressed today ? ?Delivery Plans:  ?Plans to deliver at [facility].  ? ?MyChart/Babyscripts ?MyChart access verified. I explained pt will have some visits in office and some virtually. Babyscripts instructions given and order placed. Patient verifies receipt of registration text/e-mail. Account successfully created and app downloaded. ? ?Blood Pressure Cuff  ?Blood pressure cuff ordered for patient to pick-up from [employer]. Explained after first prenatal appt pt  will check weekly and document in 54. ? ?Weight scale: Patient does / does not  have weight scale. Weight scale ordered for patient to pick up from [employer].  ? ?Anatomy US ?Explained first scheduled [place] will be around 19 weeks. Anatomy US scheduled for 08/08/21 at 01:30. Pt notified to arrive at 01:15. ?Scheduled AFP lab only appointment if CenteringPregnancy pt for same day as anatomy US.  ? ?Labs ?Discussed Johnsie Cancel genetic screening with patient. Would like both Panorama and Horizon drawn at new OB visit.Also if interested in genetic testing, tell patient she will need AFP 15-21 weeks to complete genetic testing .Routine prenatal labs needed. ? ?Covid Vaccine ?Patient has not covid vaccine.  ? ?Is patient a CenteringPregnancy candidate? Not a candidate  "Centering Patient" indicated on sticky note ?  ?Is patient a Mom+Baby Combined Care candidate? Not a candidate   Scheduled with Mom+Baby provider  ?  ?Is patient interested in [place]? No  "Interested in [employer] - Schedule next visit with CNM" on sticky note ? ?Informed patient of [facility] website  and placed link in her AVS.  ? ?Social Determinants of Health ?Food Insecurity: Patient denies food insecurity. ?WIC Referral: Patient is interested in referral to [facility].  ?Transportation: Patient denies transportation needs. ?Childcare: Discussed no children allowed at ultrasound appointments. Offered childcare services; patient declines childcare services at this time. ? ?Send link to Pregnancy Navigators ? ? ?Placed OB Box on problem list and updated ? ?First visit review ?I reviewed new OB appt with pt. I explained she will have a pelvic exam, ob bloodwork with genetic screening, and PAP smear. Explained pt will be seen by Dr.Anyanwu at first  visit; encounter routed to appropriate provider. Explained that patient will be seen by pregnancy navigator following visit with provider. [facility] information placed in AVS.  ? ?Bethanne Ginger, CMA ?05/31/2021   11:36 AM  ?

## 2021-06-12 ENCOUNTER — Telehealth: Payer: Medicaid Other | Admitting: Physician Assistant

## 2021-06-12 DIAGNOSIS — J019 Acute sinusitis, unspecified: Secondary | ICD-10-CM

## 2021-06-12 DIAGNOSIS — B9689 Other specified bacterial agents as the cause of diseases classified elsewhere: Secondary | ICD-10-CM | POA: Diagnosis not present

## 2021-06-12 MED ORDER — AMOXICILLIN 500 MG PO CAPS: 500.0000 mg | ORAL_CAPSULE | Freq: Two times a day (BID) | ORAL | 0 refills | Status: AC

## 2021-06-12 NOTE — Progress Notes (Signed)
?Virtual Visit Consent  ? ?[name], you are scheduled for a virtual visit with a [facility] provider today.   ?  ?Just as with appointments in the office, your consent must be obtained to participate.  Your consent will be active for this visit and any virtual visit you may have with one of our providers in the next 365 days.   ?  ?If you have a MyChart account, a copy of this consent can be sent to you electronically.  All virtual visits are billed to your insurance company just like a traditional visit in the office.   ? ?As this is a virtual visit, video technology does not allow for your provider to perform a traditional examination.  This may limit your provider's ability to fully assess your condition.  If your provider identifies any concerns that need to be evaluated in person or the need to arrange testing (such as labs, EKG, etc.), we will make arrangements to do so.   ?  ?Although advances in technology are sophisticated, we cannot ensure that it will always work on either your end or our end.  If the connection with a video visit is poor, the visit may have to be switched to a telephone visit.  With either a video or telephone visit, we are not always able to ensure that we have a secure connection.    ? ?I need to obtain your verbal consent now.   Are you willing to proceed with your visit today?  ?  ?OLUWASEYI TULL has provided verbal consent on 06/12/2021 for a virtual visit (video or telephone). ?  ?Mar Daring, PA-C  ? ?Date: 06/12/2021 7:33 AM ? ? ?Virtual Visit via Video Note  ? ?Reynolds Bowl, connected with  MAKAYA JUNEAU  ([id number], [birth year]) on 06/12/21 at  7:30 AM EDT by a video-enabled telemedicine application and verified that I am speaking with the correct person using two identifiers. ? ?Location: ?Patient: Virtual Visit Location Patient: Home ?Provider: Virtual Visit Location Provider: Home Office ?  ?I discussed the limitations of evaluation and  management by telemedicine and the availability of in person appointments. The patient expressed understanding and agreed to proceed.   ? ?History of Present Illness: ?Leslie Skinner is a 26 y.o. who identifies as a female who was assigned female at birth, and is being seen today for URI symptoms. ? ?HPI: URI  ?This is a new problem. The current episode started 1 to 4 weeks ago. The problem has been gradually worsening. The maximum temperature recorded prior to her arrival was 103 - 104 F. The fever has been present for 1 to 2 days. Associated symptoms include congestion, coughing, ear pain, headaches, a plugged ear sensation (bilateral), rhinorrhea, sinus pain and a sore throat. Associated symptoms comments: Weakness, fatigue. She has tried acetaminophen and increased fluids (vic vapor rub, humidifier) for the symptoms. The treatment provided no relief.   ?She is 54w2dpregnant. ? ?Problems:  ?Patient Active Problem List  ? Diagnosis Date Noted  ? Supervision of high risk pregnancy, antepartum 05/31/2021  ? History of postpartum depression 05/03/2019  ? Chronic bilateral thoracic back pain 08/05/2018  ? Current mild episode of major depressive disorder ([place] 11/14/2017  ? Contraception management 09/30/2014  ? Anxiety state 06/12/2012  ? Pre-diabetes 06/12/2012  ? Obesity, Class III, BMI 40-49.9 (morbid obesity) ([facility] 08/23/2011  ? Scoliosis 11/22/2010  ? Migraine 11/08/2010  ? GERD (gastroesophageal reflux disease)   ?  Chronic hypertension during pregnancy, antepartum 05/16/2010  ?  ?Allergies:  ?Allergies  ?Allergen Reactions  ? Morphine And Related Other (See Comments)  ?  Hot flashes, can't breathe  ? Tramadol Other (See Comments)  ?  Hot flashes   ? ?Medications:  ?Current Outpatient Medications:  ?  amoxicillin (AMOXIL) 500 MG capsule, Take 1 capsule (500 mg total) by mouth 2 (two) times daily for 10 days., Disp: 20 capsule, Rfl: 0 ?  acetaminophen (TYLENOL) 325 MG tablet, Take 2 tablets (650 mg total) by  mouth every 6 (six) hours as needed (for pain scale < 4)., Disp: 30 tablet, Rfl: 0 ?  Blood Pressure Monitoring (BLOOD PRESSURE MONITOR AUTOMAT) DEVI, 1 Device by Does not apply route daily. Automatic Blood pressure monitor with regular/large cuff. To monitor blood pressure at home on regular bases. ICD-10 Code: O09.90., Disp: 1 each, Rfl: 0 ?  Blood Pressure Monitoring DEVI, 1 each by Does not apply route once a week., Disp: 1 each, Rfl: 0 ?  doxylamine, Sleep, (UNISOM SLEEPTABS) 25 MG tablet, Take 1 tablet (25 mg total) by mouth at bedtime as needed., Disp: 30 tablet, Rfl: 0 ?  NIFEdipine (PROCARDIA-XL/NIFEDICAL-XL) 30 MG 24 hr tablet, Take 1 tablet (30 mg total) by mouth daily. (Patient not taking: Reported on 05/31/2021), Disp: 30 tablet, Rfl: 1 ?  Prenatal Vit-Fe Fumarate-FA (MULTIVITAMIN-PRENATAL) 27-0.8 MG TABS tablet, Take 1 tablet by mouth daily at 12 noon., Disp: , Rfl:  ?  pyridOXINE (VITAMIN B-6) 100 MG tablet, Take 1 tablet (100 mg total) by mouth daily., Disp: 30 tablet, Rfl: 2 ? ?Observations/Objective: ?Patient is well-developed, well-nourished in no acute distress.  ?Resting comfortably at home.  ?Head is normocephalic, atraumatic.  ?No labored breathing.  ?Speech is clear and coherent with logical content.  ?Patient is alert and oriented at baseline.  ?Nasal congestion heard ? ?Assessment and Plan: ?1. Acute bacterial sinusitis ?- amoxicillin (AMOXIL) 500 MG capsule; Take 1 capsule (500 mg total) by mouth 2 (two) times daily for 10 days.  Dispense: 20 capsule; Refill: 0 ? ?- Worsening symptoms that have not responded to OTC medications.  ?- Will give amoxicillin ?- Continue allergy medications.  ?- Steam and humidifier can help ?- Stay well hydrated and get plenty of rest.  ?- Seek in person evaluation if no symptom improvement or if symptoms worsen. ? ?Follow Up Instructions: ?I discussed the assessment and treatment plan with the patient. The patient was provided an opportunity to ask questions  and all were answered. The patient agreed with the plan and demonstrated an understanding of the instructions.  A copy of instructions were sent to the patient via MyChart unless otherwise noted below.  ? ? ?The patient was advised to call back or seek an in-person evaluation if the symptoms worsen or if the condition fails to improve as anticipated. ? ?Time:  ?I spent 10 minutes with the patient via telehealth technology discussing the above problems/concerns.   ? ?Mar Daring, PA-C ?

## 2021-06-12 NOTE — Patient Instructions (Signed)
?[name], thank you for joining Mar Daring, Leslie Skinner for today's virtual visit.  While this provider is not your primary care provider (PCP), if your PCP is located in our provider database this encounter information will be shared with them immediately following your visit. ? ?Consent: ?(Patient) Leslie Skinner provided verbal consent for this virtual visit at the beginning of the encounter. ? ?Current Medications: ? ?Current Outpatient Medications:  ?  amoxicillin (AMOXIL) 500 MG capsule, Take 1 capsule (500 mg total) by mouth 2 (two) times daily for 10 days., Disp: 20 capsule, Rfl: 0 ?  acetaminophen (TYLENOL) 325 MG tablet, Take 2 tablets (650 mg total) by mouth every 6 (six) hours as needed (for pain scale < 4)., Disp: 30 tablet, Rfl: 0 ?  Blood Pressure Monitoring (BLOOD PRESSURE MONITOR AUTOMAT) DEVI, 1 Device by Does not apply route daily. Automatic Blood pressure monitor with regular/large cuff. To monitor blood pressure at home on regular bases. ICD-10 Code: O09.90., Disp: 1 each, Rfl: 0 ?  Blood Pressure Monitoring DEVI, 1 each by Does not apply route once a week., Disp: 1 each, Rfl: 0 ?  doxylamine, Sleep, (UNISOM SLEEPTABS) 25 MG tablet, Take 1 tablet (25 mg total) by mouth at bedtime as needed., Disp: 30 tablet, Rfl: 0 ?  NIFEdipine (PROCARDIA-XL/NIFEDICAL-XL) 30 MG 24 hr tablet, Take 1 tablet (30 mg total) by mouth daily. (Patient not taking: Reported on 05/31/2021), Disp: 30 tablet, Rfl: 1 ?  Prenatal Vit-Fe Fumarate-FA (MULTIVITAMIN-PRENATAL) 27-0.8 MG TABS tablet, Take 1 tablet by mouth daily at 12 noon., Disp: , Rfl:  ?  pyridOXINE (VITAMIN B-6) 100 MG tablet, Take 1 tablet (100 mg total) by mouth daily., Disp: 30 tablet, Rfl: 2  ? ?Medications ordered in this encounter:  ?Meds ordered this encounter  ?Medications  ? amoxicillin (AMOXIL) 500 MG capsule  ?  Sig: Take 1 capsule (500 mg total) by mouth 2 (two) times daily for 10 days.  ?  Dispense:  20 capsule  ?  Refill:  0  ?   Order Specific Question:   Supervising Provider  ?  Answer:   Noemi Chapel [[id number]]  ?  ? ?*If you need refills on other medications prior to your next appointment, please contact your pharmacy* ? ?Follow-Up: ?Call back or seek an in-person evaluation if the symptoms worsen or if the condition fails to improve as anticipated. ? ?Other Instructions ?Common Medications Safe in Pregnancy ? ?Acne:      Constipation: ? Benzoyl Peroxide     Colace ? Clindamycin      Dulcolax Suppository ? Topica Erythromycin     Fibercon ? Salicylic Acid      Metamucil ?        Miralax ?AVOID:        Senakot  ? Accutane    Cough: ? Retin-A       Cough Drops ? Tetracycline      Phenergan w/ Codeine if Rx ? Minocycline      Robitussin (Plain & DM) ? ?Antibiotics:     Crabs/Lice: ? Ceclor       RID ? Cephalosporins    AVOID: ? E-Mycins      Kwell ? Keflex ? Macrobid/Macrodantin   Diarrhea: ? Penicillin      Kao-Pectate ? Zithromax      Imodium AD ?        PUSH FLUIDS ?AVOID:      ? Cipro     Fever: ? Tetracycline  Tylenol (Regular or Extra ? Minocycline       Strength) ? Levaquin      Extra Strength-Do not          Exceed 8 tabs/24 hrs ?Caffeine:       ? <248m/day (equiv. To 1 cup of coffee or ? approx. 3 12 oz sodas)   ?      Gas: ?Cold/Hayfever:       Gas-X ? Benadryl      Mylicon ? Claritin       Phazyme ? **Claritin-D       ? Chlor-Trimeton    Headaches: ? Dimetapp      ASA-Free Excedrin ? Drixoral-Non-Drowsy     Cold Compress ? Mucinex (Guaifenasin)     Tylenol (Regular or Extra ? Sudafed/Sudafed-12 Hour     Strength) ? **Sudafed PE Pseudoephedrine  ? Tylenol Cold & Sinus    ? Vicks Vapor Rub ? Zyrtec ? ?**AVOID if Problems With Blood Pressure ? ? ? ? ? ? ? ? ?Heartburn: Avoid lying down for at least 1 hour after meals ? Aciphex     ? Maalox     Rash: ? Milk of Magnesia     Benadryl   ? Mylanta       1% Hydrocortisone Cream ? Pepcid ? Pepcid Complete   Sleep  Aids: ? Prevacid      Ambien  ? Prilosec       Benadryl ? Rolaids       Chamomile Tea ? Tums (Limit 4/day)     Unisom ?        Tylenol PM ?        Warm milk-add vanilla or  ?Hemorrhoids:       Sugar for taste ? Anusol/Anusol H.C. ? (RX: Analapram 2.5%)  Sugar Substitutes: ? Hydrocortisone OTC     Ok in moderation ? Preparation H     ? Tucks       ? Vaseline lotion applied to tissue with wiping   ? ?Herpes:     Throat: ? Acyclovir      Oragel ? Famvir ? Valtrex     Vaccines: ?        Flu Shot ?Leg Cramps:       *Gardasil ? Benadryl      Hepatitis A ?        Hepatitis B ?Nasal Spray:       Pneumovax ? Saline Nasal Spray     Polio Booster ?        Tetanus ?Nausea:       Tuberculosis test or PPD ? Vitamin B6 25 mg TID   AVOID:   ? Dramamine      *Gardasil ? Emetrol       Live Poliovirus ? Ginger Root 250 mg QID    MMR (measles, mumps & ? High Complex Carbs @ Bedtime    rebella) ? Sea Bands-Accupressure    Varicella (Chickenpox) ? Unisom 1/2 tab TID     *No known complications  ?         If received before ?Pain:         Known pregnancy;  ? Darvocet       Resume series after ? Lortab        Delivery ? Percocet    Yeast:  ? Tramadol      Femstat ? Tylenol 3      Gyne-lotrimin ? Ultram       Monistat ?  Vicodin     ?      MISC: ?        All Sunscreens   ?        Hair Coloring/highlights  ?        Insect Repellant's ?         (Including DEET) ?        Mystic Tans ? ? ? ?If you have been instructed to have an in-person evaluation today at a local Urgent Care facility, please use the link below. It will take you to a list of all of our available [facility], including address, phone number and hours of operation. Please do not delay care.  ?[facility] ? ?If you or a family member do not have a primary care provider, use the link below to schedule a visit and establish care. When you choose a [facility] primary care physician or advanced practice provider, you gain a long-term partner in  health. ?Find a Primary Care Provider ? ?Learn more about [facility] in-office and virtual care options: ?[facility] ?

## 2021-06-15 ENCOUNTER — Encounter: Payer: Self-pay | Admitting: Obstetrics & Gynecology

## 2021-06-15 ENCOUNTER — Other Ambulatory Visit (HOSPITAL_COMMUNITY)
Admission: RE | Admit: 2021-06-15 | Discharge: 2021-06-15 | Disposition: A | Discharge status: Home / Self Care | Payer: Medicaid Other | Source: Ambulatory Visit | Attending: Obstetrics & Gynecology | Admitting: Obstetrics & Gynecology

## 2021-06-15 ENCOUNTER — Ambulatory Visit (INDEPENDENT_AMBULATORY_CARE_PROVIDER_SITE_OTHER): Payer: Medicaid Other | Admitting: Obstetrics & Gynecology

## 2021-06-15 VITALS — BP 133/69 | HR 96

## 2021-06-15 DIAGNOSIS — O10919 Unspecified pre-existing hypertension complicating pregnancy, unspecified trimester: Secondary | ICD-10-CM

## 2021-06-15 DIAGNOSIS — Z3A11 11 weeks gestation of pregnancy: Secondary | ICD-10-CM | POA: Diagnosis not present

## 2021-06-15 DIAGNOSIS — O99210 Obesity complicating pregnancy, unspecified trimester: Secondary | ICD-10-CM | POA: Insufficient documentation

## 2021-06-15 DIAGNOSIS — O26891 Other specified pregnancy related conditions, first trimester: Secondary | ICD-10-CM | POA: Diagnosis present

## 2021-06-15 DIAGNOSIS — O09299 Supervision of pregnancy with other poor reproductive or obstetric history, unspecified trimester: Secondary | ICD-10-CM

## 2021-06-15 DIAGNOSIS — N898 Other specified noninflammatory disorders of vagina: Secondary | ICD-10-CM | POA: Insufficient documentation

## 2021-06-15 DIAGNOSIS — O0990 Supervision of high risk pregnancy, unspecified, unspecified trimester: Secondary | ICD-10-CM

## 2021-06-15 DIAGNOSIS — R7303 Prediabetes: Secondary | ICD-10-CM

## 2021-06-15 MED ORDER — ASPIRIN EC 81 MG PO TBEC: 81.0000 mg | DELAYED_RELEASE_TABLET | Freq: Every day | ORAL | 2 refills | Status: DC

## 2021-06-15 NOTE — Progress Notes (Signed)
? ?History:  ? Leslie Skinner is a 26 y.o. G3P2002 at 47w5dby six week ultrasound being seen today for her first obstetrical visit.  Her obstetrical history is significant for obesity and chronic hypertension, no medications . 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  History of two term SVDs.  Patient does intend to breast feed. Pregnancy history fully reviewed. ? ?Patient reports no complaints. ?  ? ?  ?HISTORY: ?OB History  ?Gravida Para Term Preterm AB Living  ?'3 2 2 '$ 0 0 2  ?SAB IAB Ectopic Multiple Live Births  ?0 0 0 0 2  ?  ?# Outcome Date GA Lbr Len/2nd Weight Sex Delivery Anes PTL Lv  ?3 Current           ?2 Term 05/03/19 355w4d4:39 / 02:18 7 lb 1.8 oz (3.225 kg) F Vag-Spont Spinal  LIV  ?   Name: BELIRA, STEPHEN?   Apgar1: 8  Apgar5: 5  ?1 Term 10/25/17 3927w2d:55 / 02:04 7 lb 7.4 oz (3.385 kg) M Vag-Spont EPI  LIV  ?   Birth Comments: moulding  ?   Name: BEAGALAXY, BORDEN   Apgar1: 6  Apgar5: 8  ?  ? ?Past Medical History:  ?Diagnosis Date  ? Acanthosis nigricans, acquired   ? Anxiety state 06/12/2012  ? GERD (gastroesophageal reflux disease)   ? Goiter   ? Hypertension   ? Migraines   ? Obesity   ? Osteochondroma of lower leg   ? Pollen allergies   ? Prediabetes   ? Reports having DM as a child, but not when older? No records.  Normal/prediabetic A1Cs  ? Pregnancy induced hypertension   ? Scoliosis   ? Spinal headache   ? ?Past Surgical History:  ?Procedure Laterality Date  ? LOWER LEG SOFT TISSUE TUMOR EXCISION  2011  ? OSTEOCHONDROMA EXCISION    ? ?Family History  ?Problem Relation Age of Onset  ? Diabetes Mother   ? Ulcers Mother   ? Thyroid disease Mother   ? Cancer Mother   ? Obesity Mother   ? Heart disease Mother   ? Heart attack Mother   ?     caused death   ? Diabetes Maternal Grandmother   ? Stroke Maternal Grandmother   ? Ulcers Maternal Grandmother   ? Diabetes Maternal Grandfather   ? GER disease Maternal Grandfather   ? Heart disease  Maternal Grandfather   ? GER disease Sister   ? Diabetes Father   ? GER disease Father   ? Obesity Sister   ? Diabetes Paternal Grandmother   ? Thyroid disease Paternal Grandmother   ? Diabetes Paternal Grandfather   ? Heart disease Maternal Aunt   ? Diabetes Maternal Aunt   ? Thyroid disease Maternal Aunt   ? Cancer Maternal Aunt   ? Obesity Maternal Aunt   ? GER disease Maternal Aunt   ? ?Social History  ? ?Tobacco Use  ? Smoking status: Never  ?  Passive exposure: Yes  ? Smokeless tobacco: Never  ?Vaping Use  ? Vaping Use: Never used  ?Substance Use Topics  ? Alcohol use: No  ? Drug use: No  ? ?Allergies  ?Allergen Reactions  ? Morphine And Related Other (See Comments)  ?  Hot flashes, can't breathe  ? Tramadol Other (See Comments)  ?  Hot flashes   ? ?Current Outpatient Medications on File Prior to Visit  ?Medication Sig Dispense  Refill  ? acetaminophen (TYLENOL) 325 MG tablet Take 2 tablets (650 mg total) by mouth every 6 (six) hours as needed (for pain scale < 4). 30 tablet 0  ? amoxicillin (AMOXIL) 500 MG capsule Take 1 capsule (500 mg total) by mouth 2 (two) times daily for 10 days. 20 capsule 0  ? Blood Pressure Monitoring (BLOOD PRESSURE MONITOR AUTOMAT) DEVI 1 Device by Does not apply route daily. Automatic Blood pressure monitor with regular/large cuff. To monitor blood pressure at home on regular bases. ICD-10 Code: O09.90. 1 each 0  ? Blood Pressure Monitoring DEVI 1 each by Does not apply route once a week. 1 each 0  ? doxylamine, Sleep, (UNISOM SLEEPTABS) 25 MG tablet Take 1 tablet (25 mg total) by mouth at bedtime as needed. 30 tablet 0  ? NIFEdipine (PROCARDIA-XL/NIFEDICAL-XL) 30 MG 24 hr tablet Take 1 tablet (30 mg total) by mouth daily. (Patient not taking: Reported on 05/31/2021) 30 tablet 1  ? Prenatal Vit-Fe Fumarate-FA (MULTIVITAMIN-PRENATAL) 27-0.8 MG TABS tablet Take 1 tablet by mouth daily at 12 noon.    ? pyridOXINE (VITAMIN B-6) 100 MG tablet Take 1 tablet (100 mg total) by mouth daily.  30 tablet 2  ? ?No current facility-administered medications on file prior to visit.  ? ?Review of Systems ?Pertinent items noted in HPI and remainder of comprehensive ROS otherwise negative. ? ?Physical Exam:  ? ?Vitals:  ? 06/15/21 1033  ?BP: 133/69  ?Pulse: 96  ? ?Fetal Heart Rate (bpm): 164   ?General: well-developed, well-nourished female in no acute distress  ?Breasts:  deferred  ?Skin: normal coloration and turgor, no rashes  ?Neurologic: oriented, normal, negative, normal mood  ?Extremities: normal strength, tone, and muscle mass, ROM of all joints is normal  ?HEENT PERRLA, extraocular movement intact and sclera clear, anicteric  ?Neck supple and no masses  ?Cardiovascular: regular rate and rhythm  ?Respiratory:  no respiratory distress, normal breath sounds  ?Abdomen: soft, non-tender; bowel sounds normal; no masses,  no organomegaly  ?Pelvic: normal external genitalia, no lesions, normal vaginal mucosa, malodorous thin white vaginal discharge seen and testing sample obtained, normal multiparous cervix, pap smear done. Exam done in the presence of a chaperone.   ?  ?Assessment:  ?  ?Pregnancy: [id number] ?Patient Active Problem List  ? Diagnosis Date Noted  ? Maternal morbid obesity, antepartum ([place]) 06/15/2021  ? Previous pregnancy with congenital heart defect, currently pregnant 06/15/2021  ? Supervision of high risk pregnancy, antepartum 05/31/2021  ? History of postpartum depression 05/03/2019  ? Chronic bilateral thoracic back pain 08/05/2018  ? Current mild episode of major depressive disorder ([place]) 11/14/2017  ? Pre-diabetes 06/12/2012  ? Obesity, Class III, BMI 40-49.9 (morbid obesity) ([place]) 08/23/2011  ? Scoliosis 11/22/2010  ? Migraine 11/08/2010  ? GERD (gastroesophageal reflux disease)   ? Chronic hypertension during pregnancy, antepartum 05/16/2010  ? ?  ?Plan:  ?  ?1. Maternal morbid obesity, antepartum ([place]) ?Recommended weight gain 11-20 lbs.   Labs checked today. ASA ordered. Referred to  Cardio Obstetrics. ?- Hemoglobin A1c ?- Comprehensive metabolic panel ?- TSH ?- Protein / creatinine ratio, urine ?- aspirin EC 81 MG tablet; Take 1 tablet (81 mg total) by mouth daily. Take after 12 weeks for prevention of preeclampsia later in pregnancy  Dispense: 300 tablet; Refill: 2 ?- AMB Referral to [facility] ? ?2. Pre-diabetes ?- Hemoglobin A1c ?- AMB Referral to [facility] ? ?3. Chronic hypertension during pregnancy, antepartum ?Stable BP, ASA prescribed.  Serial scans as  per MFM. Continue to watch BP. Referred to Cardio Obstetrics. ?- Protein / creatinine ratio, urine ?- aspirin EC 81 MG tablet; Take 1 tablet (81 mg total) by mouth daily. Take after 12 weeks for prevention of preeclampsia later in pregnancy  Dispense: 300 tablet; Refill: 2 ?- AMB Referral to [facility] ? ?4. Previous pregnancy with congenital heart defect, currently pregnant ?Fetal ECHO ordered ?- US Fetal Echocardiography; Future ? ?5. Vaginal discharge during pregnancy in first trimester ?- Cervicovaginal ancillary only done, will follow up results and manage accordingly. ? ?6. Supervision of high risk pregnancy, antepartum ?7. [redacted] weeks gestation of pregnancy ?- CBC/D/Plt+RPR+Rh+ABO+RubIgG... ?- Culture, OB Urine ?- Genetic Screening ?- Cytology - PAP ? ?Initial labs drawn. ?Continue prenatal vitamins. ?Problem list reviewed and updated. ?Genetic Screening discussed, NIPS: ordered. ?Ultrasound discussed; fetal anatomic survey:  already scheduled . ?Anticipatory guidance about prenatal visits given including labs, ultrasounds, and testing. ?Discussed usage of the Babyscripts app for more information about pregnancy, and to track blood pressures. ?Also discussed usage of virtual visits as additional source of managing and completing prenatal visits.   ?Patient was encouraged to use MyChart to review results, send requests, and have questions addressed.   ?The nature of [facility]  Healthcare/Faculty Practice with multiple MDs and Advanced Practice Providers was explained to patient; also emphasized that residents, students are part of our team. ?Routine obstetric precautions reviewed. Encouraged to see

## 2021-06-16 LAB — COMPREHENSIVE METABOLIC PANEL
ALT: 18 IU/L (ref 0–32)
AST: 20 IU/L (ref 0–40)
Albumin/Globulin Ratio: 1.5 (ref 1.2–2.2)
Albumin: 4 g/dL (ref 3.9–5.0)
Alkaline Phosphatase: 79 IU/L (ref 44–121)
BUN/Creatinine Ratio: 15 (ref 9–23)
BUN: 11 mg/dL (ref 6–20)
Bilirubin Total: 0.4 mg/dL (ref 0.0–1.2)
CO2: 20 mmol/L (ref 20–29)
Calcium: 9.1 mg/dL (ref 8.7–10.2)
Chloride: 103 mmol/L (ref 96–106)
Creatinine, Ser: 0.73 mg/dL (ref 0.57–1.00)
Globulin, Total: 2.7 g/dL (ref 1.5–4.5)
Glucose: 88 mg/dL (ref 70–99)
Potassium: 3.8 mmol/L (ref 3.5–5.2)
Sodium: 139 mmol/L (ref 134–144)
Total Protein: 6.7 g/dL (ref 6.0–8.5)
eGFR: 116 mL/min/{1.73_m2} (ref >59)

## 2021-06-16 LAB — CBC/D/PLT+RPR+RH+ABO+RUBIGG...
Antibody Screen: NEGATIVE
Basophils Absolute: 0 10*3/uL (ref 0.0–0.2)
Basos: 1 %
EOS (ABSOLUTE): 0.1 10*3/uL (ref 0.0–0.4)
Eos: 1 %
HCV Ab: NONREACTIVE
HIV Screen 4th Generation wRfx: NONREACTIVE
Hematocrit: 35.4 % (ref 34.0–46.6)
Hemoglobin: 12 g/dL (ref 11.1–15.9)
Hepatitis B Surface Ag: NEGATIVE
Immature Grans (Abs): 0 10*3/uL (ref 0.0–0.1)
Immature Granulocytes: 1 %
Lymphocytes Absolute: 3.2 10*3/uL — ABNORMAL HIGH (ref 0.7–3.1)
Lymphs: 38 %
MCH: 27.2 pg (ref 26.6–33.0)
MCHC: 33.9 g/dL (ref 31.5–35.7)
MCV: 80 fL (ref 79–97)
Monocytes Absolute: 0.5 10*3/uL (ref 0.1–0.9)
Monocytes: 6 %
Neutrophils Absolute: 4.6 10*3/uL (ref 1.4–7.0)
Neutrophils: 53 %
Platelets: 219 10*3/uL (ref 150–450)
RBC: 4.41 x10E6/uL (ref 3.77–5.28)
RDW: 14.7 % (ref 11.7–15.4)
RPR Ser Ql: NONREACTIVE
Rh Factor: POSITIVE
Rubella Antibodies, IGG: 4.73 index (ref >0.99)
WBC: 8.5 10*3/uL (ref 3.4–10.8)

## 2021-06-16 LAB — HEMOGLOBIN A1C
Est. average glucose Bld gHb Est-mCnc: 114 mg/dL
Hgb A1c MFr Bld: 5.6 % (ref 4.8–5.6)

## 2021-06-16 LAB — HCV INTERPRETATION

## 2021-06-16 LAB — TSH: TSH: 2.33 u[IU]/mL (ref 0.450–4.500)

## 2021-06-17 LAB — CULTURE, OB URINE

## 2021-06-17 LAB — URINE CULTURE, OB REFLEX

## 2021-06-19 LAB — PROTEIN / CREATININE RATIO, URINE
Creatinine, Urine: 158.5 mg/dL
Protein, Ur: 15.9 mg/dL
Protein/Creat Ratio: 100 mg/g creat (ref 0–200)

## 2021-06-20 LAB — CERVICOVAGINAL ANCILLARY ONLY
Bacterial Vaginitis (gardnerella): POSITIVE — AB
Candida Glabrata: NEGATIVE
Candida Vaginitis: POSITIVE — AB
Chlamydia: NEGATIVE
Comment: NEGATIVE
Comment: NEGATIVE
Comment: NEGATIVE
Comment: NEGATIVE
Comment: NEGATIVE
Comment: NORMAL
Neisseria Gonorrhea: NEGATIVE
Trichomonas: NEGATIVE

## 2021-06-20 MED ORDER — METRONIDAZOLE 500 MG PO TABS: 500.0000 mg | ORAL_TABLET | Freq: Two times a day (BID) | ORAL | 0 refills | Status: AC

## 2021-06-20 MED ORDER — TERCONAZOLE 0.8 % VA CREA: 1.0000 | TOPICAL_CREAM | Freq: Every day | VAGINAL | 0 refills | Status: DC

## 2021-06-20 NOTE — Addendum Note (Signed)
Addended by: Verita Schneiders A on: 06/20/2021 02:03 PM ? ? Modules accepted: Orders ? ?

## 2021-06-23 LAB — CYTOLOGY - PAP
Comment: NEGATIVE
High risk HPV: POSITIVE — AB

## 2021-06-26 ENCOUNTER — Encounter: Payer: Self-pay | Admitting: Obstetrics & Gynecology

## 2021-06-26 DIAGNOSIS — R87612 Low grade squamous intraepithelial lesion on cytologic smear of cervix (LGSIL): Secondary | ICD-10-CM

## 2021-06-26 HISTORY — DX: Low grade squamous intraepithelial lesion on cytologic smear of cervix (LGSIL): R87.612

## 2021-06-27 ENCOUNTER — Telehealth: Payer: Medicaid Other

## 2021-06-27 ENCOUNTER — Telehealth: Payer: Self-pay | Admitting: Family Medicine

## 2021-06-27 ENCOUNTER — Telehealth: Payer: Self-pay | Admitting: *Deleted

## 2021-06-27 NOTE — Telephone Encounter (Signed)
-----   Message from Osborne Oman, MD sent at 06/26/2021  8:19 AM EDT ----- ?Please schedule patient for colposcopy for LGSIL +HPV pap done on 06/15/2021. Can add procedure on to 07/13/2021 appointment but have to give enough time to do this procedure and visit (or book as separate appointment if time does not permit).  Please call to inform patient of results and need for appointment. ? ?Verita Schneiders, MD ?

## 2021-06-27 NOTE — Telephone Encounter (Signed)
I called Leslie Skinner and was unable to leave a message. I heard a message " your call cannot be completed at this time". ?[name] ?

## 2021-06-27 NOTE — Telephone Encounter (Signed)
Called patient to schedule colpo procedure, there was no answer to the phone call and the option to leave a voicemail was unavailable. A letter was mailed.   ?

## 2021-06-28 ENCOUNTER — Telehealth: Payer: Medicaid Other

## 2021-06-28 ENCOUNTER — Telehealth: Payer: Self-pay | Admitting: Family Medicine

## 2021-06-28 NOTE — Telephone Encounter (Signed)
Called pt and pt informed me that she had to call EMS yesterday as she was cooking and became lightheaded and dizzy.  She was not taken to the hospital EMS advised that she notify her doctors office.  I advised pt to make sure that she is drinking plenty of water as pregnancy and becoming overheated is common.  I encouraged pt to make sure that she eating small frequent meals every 3-4 hours.  Pt verbalized understanding.   ? ?Thi Sisemore,RN  ?06/28/21 ?

## 2021-06-28 NOTE — Telephone Encounter (Signed)
Pt concern addressed in another encounter. ? ?Shi Blankenship,RN  ?

## 2021-06-28 NOTE — Telephone Encounter (Signed)
Patient said she had to call EMS to her house yesterday due to her being light headed and dizzy, patient would like to speak to a nurse  ?

## 2021-06-29 ENCOUNTER — Encounter: Payer: Self-pay | Admitting: *Deleted

## 2021-07-13 ENCOUNTER — Encounter: Payer: Self-pay | Admitting: Obstetrics and Gynecology

## 2021-07-13 ENCOUNTER — Ambulatory Visit (INDEPENDENT_AMBULATORY_CARE_PROVIDER_SITE_OTHER): Payer: Medicaid Other | Admitting: Obstetrics and Gynecology

## 2021-07-13 VITALS — BP 136/85 | HR 101 | Wt 248.0 lb

## 2021-07-13 DIAGNOSIS — O09299 Supervision of pregnancy with other poor reproductive or obstetric history, unspecified trimester: Secondary | ICD-10-CM

## 2021-07-13 DIAGNOSIS — O10919 Unspecified pre-existing hypertension complicating pregnancy, unspecified trimester: Secondary | ICD-10-CM

## 2021-07-13 DIAGNOSIS — R87612 Low grade squamous intraepithelial lesion on cytologic smear of cervix (LGSIL): Secondary | ICD-10-CM

## 2021-07-13 DIAGNOSIS — O99210 Obesity complicating pregnancy, unspecified trimester: Secondary | ICD-10-CM

## 2021-07-13 DIAGNOSIS — O0990 Supervision of high risk pregnancy, unspecified, unspecified trimester: Secondary | ICD-10-CM

## 2021-07-13 MED ORDER — ASPIRIN EC 81 MG PO TBEC: 81.0000 mg | DELAYED_RELEASE_TABLET | Freq: Every day | ORAL | 2 refills | Status: DC

## 2021-07-13 NOTE — Progress Notes (Signed)
Subjective:  ?Leslie Skinner is a 26 y.o. G3P2002 at 45w5dbeing seen today for ongoing prenatal care.  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 ? ?Patient reports no complaints.  Contractions: Not present. Vag. Bleeding: None.  Movement: Absent. Denies leaking of fluid.  ? ?The following portions of the patient's history were reviewed and updated as appropriate: allergies, current medications, past family history, past medical history, past social history, past surgical history and problem list. Problem list updated. ? ?Objective:  ? ?Vitals:  ? 07/13/21 1114  ?BP: 136/85  ?Pulse: (!) 101  ?Weight: 248 lb (112.5 kg)  ? ? ?Fetal Status:     Movement: Absent    ? ?General:  Alert, oriented and cooperative. Patient is in no acute distress.  ?Skin: Skin is warm and dry. No rash noted.   ?Cardiovascular: Normal heart rate noted  ?Respiratory: Normal respiratory effort, no problems with respiration noted  ?Abdomen: Soft, gravid, appropriate for gestational age. Pain/Pressure: Absent     ?Pelvic:  Cervical exam deferred        ?Extremities: Normal range of motion.  Edema: None  ?Mental Status: Normal mood and affect. Normal behavior. Normal judgment and thought content.  ? ?Urinalysis:     ? ?Assessment and Plan:  ?Pregnancy: [id number] [redacted]w[redacted]d ?1. Supervision of high risk pregnancy, antepartum ?Stable ?AFP today ?Anatomy scan  scheduled ? ?2. Maternal morbid obesity, antepartum ([facility]?Referred to OB cards ?Qd BASA ? ?3. Previous pregnancy with congenital heart defect, currently pregnant ?Fetal ECHO ordered ? ?4. Obesity, Class III, BMI 40-49.9 (morbid obesity) ([place]?See above ? ?5. 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 ?6. Chronic hypertension during pregnancy, antepartum ?BP stable without meds ?Serial growth scans and antenatal testing as per protocol ? ?Preterm labor symptoms and general obstetric precautions including but not limited to vaginal bleeding, contractions, leaking of fluid and fetal movement were reviewed in detail with the patient. ?Please refer to After Visit Summary for other counseling recommendations.  ?Return in about 4 weeks (around 08/10/2021) for OB visit, face to face, MD only, plus colpo. ? ? ?ErChancy MilroyMD ? ?

## 2021-07-13 NOTE — Patient Instructions (Addendum)
Colposcopy ? ?Colposcopy is a procedure to examine the lowest part of the uterus (cervix) for abnormalities or signs of disease. This procedure is done using an instrument that makes objects appear larger and provides light (colposcope). During the procedure, your health care provider may remove a tissue sample to look at under a microscope (biopsy). A biopsy may be done if any unusual cells are seen during the colposcopy. ?You may have a colposcopy if you have: ?An abnormal Pap smear, also called a Pap test. This screening test is used to check for signs of cancer or infection of the vagina, cervix, and uterus. ?An HPV (human papillomavirus) test and get a positive result for a type of HPV that puts you at high risk of cancer. ?Certain conditions or symptoms, such as: ?A sore, or lesion, on your cervix. ?Genital warts on your vulva, vagina, or cervix. ?Pain during sex. ?Vaginal bleeding, especially after sex. ?A growth on your cervix (cervical polyp) that needs to be removed. ?Let your health care provider know about: ?Any allergies you have, including allergies to medicines, latex, or iodine. ?All medicines you are taking, including vitamins, herbs, eye drops, creams, and over-the-counter medicines. ?Any bleeding problems you have. ?Any surgeries you have had. ?Any medical conditions you have, such as pelvic inflammatory disease (PID) or an endometrial disorder. ?The pattern of your menstrual cycles and the form of birth control (contraception) you use, if any. ?Your medical history, including any cervical treatments and how well you tolerated the procedure (if you have ever fainted). ?Whether you are pregnant or may be pregnant. ?What are the risks? ?Generally, this is a safe procedure. However, problems may occur, including: ?Infection. Symptoms of infection may include fever, bad-smelling vaginal discharge, or pelvic pain. ?Vaginal bleeding. ?Allergic reactions to medicines. ?Damage to nearby structures or  organs. ?What happens before the procedure? ?Medicines ?Ask your health care provider about: ?Changing or stopping your regular medicines. This is especially important if you are taking diabetes medicines or blood thinners. ?Taking medicines such as aspirin and ibuprofen. These medicines can thin your blood. Do not take these medicines unless your health care provider tells you to take them. Your health care provider will likely tell you to avoid taking aspirin, or medicine that contains aspirin, for 7 days before the procedure. ?Taking over-the-counter medicines, vitamins, herbs, and supplements. ?General instructions ?Tell your health care provider if you have your menstrual period now or will have it at the time of your procedure. A colposcopy is not normally done during your menstrual period. ?If you use contraception, continue to use it before your procedure. ?For 24 hours before the procedure: ?Do not use douche products or tampons. ?Do not use medicines, creams, or suppositories in the vagina. ?Do not have sex or insert anything into your vagina. ?Ask your health care provider what steps will be taken to prevent infection. ?What happens during the procedure? ?You will lie down on your back, with your feet in foot rests (stirrups). ?An instrument called a speculum will be inserted into your vagina. This will be used so your health care provider can see your cervix and the inside of your vagina. ?A cotton swab will be used to place a small amount of a liquid (solution) on the areas to be examined. This solution makes it easier to see abnormal cells. You may feel a slight burning during this part. ?The colposcope will be used to scan the cervix with a bright white light. The colposcope will be held near  your vulva and will make your vulva, vagina, and cervix look bigger so they can be seen better. ?If a biopsy is needed: ?You may be given a medicine to numb the area (local anesthetic). ?Surgical tools will be  used to remove mucus and cells through your vagina. ?You may feel mild pain while the tissue sample is removed. ?Bleeding may occur. A solution may be used to stop the bleeding. ?The tissue removed will be sent to a lab to be looked at under a microscope. ?The procedure may vary among health care providers and hospitals. ?What happens after the procedure? ?You may have some cramping in your abdomen. This should go away after a few minutes. ?It is up to you to get the results of your procedure. Ask your health care provider, or the department that is doing the procedure, when your results will be ready. ?Summary ?Colposcopy is a procedure to examine the lowest part of the uterus (cervix), for signs of disease. ?A biopsy may be done as part of the procedure. ?You may have some cramping in your abdomen. This should go away after a few minutes. ?It is up to you to get the results of your procedure. Ask your health care provider, or the department that is doing the procedure, when your results will be ready. ?This information is not intended to replace advice given to you by your health care provider. Make sure you discuss any questions you have with your health care provider. ?Document Revised: 07/24/2020 Document Reviewed: 07/24/2020 ?Elsevier Patient Education ? [place]. ?Second Trimester of Pregnancy ? ?The second trimester of pregnancy is from week 13 through week 27. This is months 4 through 6 of pregnancy. The second trimester is often a time when you feel your best. Your body has adjusted to being pregnant, and you begin to feel better physically. ?During the second trimester: ?Morning sickness has lessened or stopped completely. ?You may have more energy. ?You may have an increase in appetite. ?The second trimester is also a time when the unborn baby (fetus) is growing rapidly. At the end of the sixth month, the fetus may be up to 12 inches long and weigh about 1? pounds. You will likely begin to feel  the baby move (quickening) between 16 and 20 weeks of pregnancy. ?Body changes during your second trimester ?Your body continues to go through many changes during your second trimester. The changes vary and generally return to normal after the baby is born. ?Physical changes ?Your weight will continue to increase. You will notice your lower abdomen bulging out. ?You may begin to get stretch marks on your hips, abdomen, and breasts. ?Your breasts will continue to grow and to become tender. ?Dark spots or blotches (chloasma or mask of pregnancy) may develop on your face. ?A dark line from your belly button to the pubic area (linea nigra) may appear. ?You may have changes in your hair. These can include thickening of your hair, rapid growth, and changes in texture. Some people also have hair loss during or after pregnancy, or hair that feels dry or thin. ?Health changes ?You may develop headaches. ?You may have heartburn. ?You may develop constipation. ?You may develop hemorrhoids or swollen, bulging veins (varicose veins). ?Your gums may bleed and may be sensitive to brushing and flossing. ?You may urinate more often because the fetus is pressing on your bladder. ?You may have back pain. This is caused by: ?Weight gain. ?Pregnancy hormones that are relaxing the joints in your  pelvis. ?A shift in weight and the muscles that support your balance. ?Follow these instructions at home: ?Medicines ?Follow your health care provider's instructions regarding medicine use. Specific medicines may be either safe or unsafe to take during pregnancy. Do not take any medicines unless approved by your health care provider. ?Take a prenatal vitamin that contains at least 600 micrograms (mcg) of folic acid. ?Eating and drinking ?Eat a healthy diet that includes fresh fruits and vegetables, whole grains, good sources of protein such as meat, eggs, or tofu, and low-fat dairy products. ?Avoid raw meat and unpasteurized juice, milk, and  cheese. These carry germs that can harm you and your baby. ?You may need to take these actions to prevent or treat constipation: ?Drink enough fluid to keep your urine pale yellow. ?Eat foods that are high in f

## 2021-07-15 LAB — AFP, SERUM, OPEN SPINA BIFIDA
AFP MoM: 0.66
AFP Value: 17.3 ng/mL
Gest. Age on Collection Date: 15.5 weeks
Maternal Age At EDD: 27.9 yr
OSBR Risk 1 IN: 10000
Test Results:: NEGATIVE
Weight: 248 [lb_av]

## 2021-07-24 ENCOUNTER — Ambulatory Visit: Payer: Medicaid Other | Admitting: Obstetrics & Gynecology

## 2021-08-01 ENCOUNTER — Encounter (HOSPITAL_COMMUNITY): Payer: Self-pay | Admitting: Obstetrics and Gynecology

## 2021-08-01 ENCOUNTER — Inpatient Hospital Stay (HOSPITAL_COMMUNITY)
Admission: AD | Admit: 2021-08-01 | Discharge: 2021-08-01 | Disposition: A | Discharge status: Home / Self Care | Payer: Medicaid Other | Attending: Obstetrics and Gynecology | Admitting: Obstetrics and Gynecology

## 2021-08-01 ENCOUNTER — Inpatient Hospital Stay (HOSPITAL_BASED_OUTPATIENT_CLINIC_OR_DEPARTMENT_OTHER): Payer: Medicaid Other

## 2021-08-01 ENCOUNTER — Other Ambulatory Visit: Payer: Self-pay

## 2021-08-01 ENCOUNTER — Inpatient Hospital Stay (HOSPITAL_COMMUNITY): Payer: Medicaid Other

## 2021-08-01 DIAGNOSIS — Z3A18 18 weeks gestation of pregnancy: Secondary | ICD-10-CM | POA: Diagnosis not present

## 2021-08-01 DIAGNOSIS — O10012 Pre-existing essential hypertension complicating pregnancy, second trimester: Secondary | ICD-10-CM

## 2021-08-01 DIAGNOSIS — O352XX0 Maternal care for (suspected) hereditary disease in fetus, not applicable or unspecified: Secondary | ICD-10-CM

## 2021-08-01 DIAGNOSIS — O99212 Obesity complicating pregnancy, second trimester: Secondary | ICD-10-CM | POA: Diagnosis not present

## 2021-08-01 DIAGNOSIS — O219 Vomiting of pregnancy, unspecified: Secondary | ICD-10-CM | POA: Diagnosis not present

## 2021-08-01 DIAGNOSIS — M545 Low back pain, unspecified: Secondary | ICD-10-CM

## 2021-08-01 DIAGNOSIS — O26892 Other specified pregnancy related conditions, second trimester: Secondary | ICD-10-CM | POA: Insufficient documentation

## 2021-08-01 DIAGNOSIS — G8929 Other chronic pain: Secondary | ICD-10-CM

## 2021-08-01 DIAGNOSIS — M549 Dorsalgia, unspecified: Secondary | ICD-10-CM

## 2021-08-01 DIAGNOSIS — R102 Pelvic and perineal pain: Secondary | ICD-10-CM | POA: Insufficient documentation

## 2021-08-01 DIAGNOSIS — O321XX0 Maternal care for breech presentation, not applicable or unspecified: Secondary | ICD-10-CM

## 2021-08-01 DIAGNOSIS — E669 Obesity, unspecified: Secondary | ICD-10-CM

## 2021-08-01 LAB — CBC WITH DIFFERENTIAL/PLATELET
Abs Immature Granulocytes: 0.06 10*3/uL (ref 0.00–0.07)
Basophils Absolute: 0 10*3/uL (ref 0.0–0.1)
Basophils Relative: 0 %
Eosinophils Absolute: 0 10*3/uL (ref 0.0–0.5)
Eosinophils Relative: 0 %
HCT: 37.2 % (ref 36.0–46.0)
Hemoglobin: 12.6 g/dL (ref 12.0–15.0)
Immature Granulocytes: 1 %
Lymphocytes Relative: 8 %
Lymphs Abs: 0.8 10*3/uL (ref 0.7–4.0)
MCH: 27.3 pg (ref 26.0–34.0)
MCHC: 33.9 g/dL (ref 30.0–36.0)
MCV: 80.7 fL (ref 80.0–100.0)
Monocytes Absolute: 0.4 10*3/uL (ref 0.1–1.0)
Monocytes Relative: 4 %
Neutro Abs: 9.2 10*3/uL — ABNORMAL HIGH (ref 1.7–7.7)
Neutrophils Relative %: 87 %
Platelets: 191 10*3/uL (ref 150–400)
RBC: 4.61 MIL/uL (ref 3.87–5.11)
RDW: 15.1 % (ref 11.5–15.5)
WBC: 10.5 10*3/uL (ref 4.0–10.5)
nRBC: 0 % (ref 0.0–0.2)

## 2021-08-01 LAB — RENAL FUNCTION PANEL
Albumin: 3 g/dL — ABNORMAL LOW (ref 3.5–5.0)
Anion gap: 10 (ref 5–15)
BUN: 8 mg/dL (ref 6–20)
CO2: 22 mmol/L (ref 22–32)
Calcium: 9.1 mg/dL (ref 8.9–10.3)
Chloride: 103 mmol/L (ref 98–111)
Creatinine, Ser: 0.83 mg/dL (ref 0.44–1.00)
GFR, Estimated: >60 mL/min (ref >60)
Glucose, Bld: 102 mg/dL — ABNORMAL HIGH (ref 70–99)
Phosphorus: 2.9 mg/dL (ref 2.5–4.6)
Potassium: 3.6 mmol/L (ref 3.5–5.1)
Sodium: 135 mmol/L (ref 135–145)

## 2021-08-01 LAB — URINALYSIS, ROUTINE W REFLEX MICROSCOPIC
Bilirubin Urine: NEGATIVE
Glucose, UA: NEGATIVE mg/dL
Hgb urine dipstick: NEGATIVE
Ketones, ur: 80 mg/dL — AB
Leukocytes,Ua: NEGATIVE
Nitrite: NEGATIVE
Protein, ur: NEGATIVE mg/dL
Specific Gravity, Urine: 1.024 (ref 1.005–1.030)
pH: 5 (ref 5.0–8.0)

## 2021-08-01 LAB — WET PREP, GENITAL
Clue Cells Wet Prep HPF POC: NONE SEEN
Sperm: NONE SEEN
Trich, Wet Prep: NONE SEEN
WBC, Wet Prep HPF POC: <10 (ref <10)
Yeast Wet Prep HPF POC: NONE SEEN

## 2021-08-01 MED ORDER — HYDROMORPHONE HCL 1 MG/ML IJ SOLN
0.5000 mg | Freq: Once | INTRAMUSCULAR | Status: AC
  Administered 2021-08-01: 0.5 mg via INTRAVENOUS
  Filled 2021-08-01: qty 1

## 2021-08-01 MED ORDER — GABAPENTIN 300 MG PO CAPS
300.0000 mg | ORAL_CAPSULE | Freq: Once | ORAL | Status: AC
  Administered 2021-08-01: 300 mg via ORAL
  Filled 2021-08-01: qty 1

## 2021-08-01 MED ORDER — CYCLOBENZAPRINE HCL 5 MG PO TABS
10.0000 mg | ORAL_TABLET | Freq: Once | ORAL | Status: AC
Start: 2021-08-01 — End: 2021-08-01
  Administered 2021-08-01: 10 mg via ORAL
  Filled 2021-08-01: qty 2

## 2021-08-01 MED ORDER — CYCLOBENZAPRINE HCL 5 MG PO TABS: 5.0000 mg | ORAL_TABLET | Freq: Three times a day (TID) | ORAL | 0 refills | Status: DC | PRN

## 2021-08-01 MED ORDER — LACTATED RINGERS IV BOLUS
1000.0000 mL | Freq: Once | INTRAVENOUS | Status: AC
  Administered 2021-08-01: 1000 mL via INTRAVENOUS

## 2021-08-01 NOTE — MAU Note (Addendum)
...  Leslie Skinner is a 26 y.o. at 70w3dhere in MAU reporting: She woke up nauseous this morning around 0800 so she attempted to drink juice. She reports shortly after she began experiencing constant pelvic pain that is worse with walking and movement and has continued to be nauseous. Denies VB or LOF. Denies urinary frequency and burning with urination. Last IC the night before last. She reports she was experiencing left pelvic pain then but it ceased.  She reports she was diagnosed with a yeast infection in April and was prescribed terazol cream as well as metronidazole. She reports she finished the cream but is still taking the metronidazole as she always forgets to take it. Last dose this AM.   Last doses: Vitamin B-6 - 1000 Unisom sleep tabs - 1000 Tylenol sometime this morning - unsure of time.  Pain score:  6/10 pelvic pain   FHT: 155 doppler Lab orders placed from triage: UA

## 2021-08-01 NOTE — MAU Provider Note (Signed)
History     CSN: [id number]  Arrival date and time: 08/01/21 1542   Event Date/Time   First Provider Initiated Contact with Patient 08/01/21 1644      Chief Complaint  Patient presents with   Nausea   Emesis   Abdominal Pain   Leslie Skinner 26 y.o. [id number] at 76w3dpresents to MAU for complaints of left sided back pain that radiates to the front. Pt states that pain is new onset as of this morning. Pt states she has not attempted anything for pain since pain has grown increasingly worse since onset. Pt states "was 4/10, but since arrival is now 8/10." She endorses lower abdominal cramping, pelvic pain and vomiting also since this morning. Pt states that she has has some burning with urination and frequency. She denies vaginal bleeding and leaking of fluid. no fetal movement today. +dopplers of 155 upon arrival.     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 No records.  Normal/prediabetic A1Cs   Pregnancy induced hypertension    Scoliosis    Spinal headache     Past Surgical History:  Procedure Laterality Date   LOWER LEG SOFT TISSUE TUMOR EXCISION  2011   OSTEOCHONDROMA EXCISION      Family History  Problem Relation Age of Onset   Diabetes Mother    Ulcers Mother    Thyroid disease Mother    Cancer Mother    Obesity Mother    Heart disease Mother    Heart attack Mother        caused death    Diabetes Maternal Grandmother    Stroke Maternal Grandmother     Ulcers Maternal Grandmother    Diabetes Maternal Grandfather    GER disease Maternal Grandfather    Heart disease Maternal Grandfather    GER disease Sister    Diabetes Father    GER disease Father    Obesity Sister    Diabetes Paternal Grandmother    Thyroid disease Paternal Grandmother    Diabetes Paternal Grandfather    Heart disease Maternal Aunt    Diabetes Maternal Aunt    Thyroid disease Maternal Aunt    Cancer Maternal Aunt    Obesity Maternal Aunt    GER disease Maternal Aunt     Social History   Tobacco Use   Smoking status: Never    Passive exposure: Yes   Smokeless tobacco: Never  Vaping Use   Vaping Use: Never used  Substance Use Topics   Alcohol use: No   Drug use: No    Allergies:  Allergies  Allergen Reactions   Morphine And Related  Other (See Comments)    Hot flashes, can't breathe   Tramadol Other (See Comments)    Hot flashes     No medications prior to admission.    Review of Systems  Constitutional:  Negative for fever.  Respiratory:  Negative for apnea and shortness of breath.   Cardiovascular:  Negative for chest pain and leg swelling.  Gastrointestinal:  Positive for abdominal pain, nausea and vomiting.  Genitourinary:  Positive for flank pain, pelvic pain and urgency. Negative for vaginal bleeding, vaginal discharge and vaginal pain.  Musculoskeletal:  Positive for back pain.  Physical Exam   Blood pressure 132/69, pulse (!) 115, temperature 99.7 F (37.6 C), temperature source Oral, resp. rate 18, height '5\' 2"'$  (1.575 m), weight 114.4 kg, SpO2 100 %, unknown if currently breastfeeding.  Physical Exam Vitals and nursing note reviewed. Exam conducted with a chaperone present.  Constitutional:      General: She is in acute distress.     Appearance: She is well-developed.     Comments: Pt visibly uncomfortable, writhing in pain on the stretcher   HENT:     Head: Normocephalic.  Cardiovascular:     Rate and Rhythm: Tachycardia  present.  Pulmonary:     Effort: Pulmonary effort is normal. No respiratory distress.  Abdominal:     Palpations: Abdomen is soft.     Tenderness: There is abdominal tenderness in the left lower quadrant. There is left CVA tenderness and guarding.     Comments: Pregnant   Genitourinary:    Vagina: Vaginal discharge present.  Skin:    General: Skin is warm and dry.  Neurological:     Mental Status: She is alert and oriented to person, place, and time.  Psychiatric:        Mood and Affect: Mood normal.        MAU Course  Procedures Lab Orders         Wet prep, genital         Urinalysis, Routine w reflex microscopic Urine, Clean Catch         Renal function panel         CBC with Differential/Platelet    Results for orders placed or performed during the hospital encounter of 08/01/21 (from the past 24 hour(s))  Urinalysis, Routine w reflex microscopic Urine, Clean Catch     Status: Abnormal   Collection Time: 08/01/21  4:06 PM  Result Value Ref Range   Color, Urine YELLOW YELLOW   APPearance CLEAR CLEAR   Specific Gravity, Urine 1.024 1.005 - 1.030   pH 5.0 5.0 - 8.0   Glucose, UA NEGATIVE NEGATIVE mg/dL   Hgb urine dipstick NEGATIVE NEGATIVE   Bilirubin Urine NEGATIVE NEGATIVE   Ketones, ur 80 (A) NEGATIVE mg/dL   Protein, ur NEGATIVE NEGATIVE mg/dL   Nitrite NEGATIVE NEGATIVE   Leukocytes,Ua NEGATIVE NEGATIVE  Renal function panel     Status: Abnormal   Collection Time: 08/01/21  5:01 PM  Result Value Ref Range   Sodium 135 135 - 145 mmol/L   Potassium 3.6 3.5 - 5.1 mmol/L   Chloride 103 98 - 111 mmol/L   CO2 22 22 - 32 mmol/L   Glucose, Bld 102 (H) 70 - 99 mg/dL   BUN 8 6 - 20 mg/dL   Creatinine, Ser 0.83 0.44 - 1.00 mg/dL   Calcium 9.1 8.9 - 10.3 mg/dL   Phosphorus 2.9 2.5 - 4.6 mg/dL   Albumin 3.0 (L) 3.5 - 5.0 g/dL  GFR, Estimated >60 >60 mL/min   Anion gap 10 5 - 15  CBC with Differential/Platelet     Status: Abnormal   Collection Time: 08/01/21  5:01 PM   Result Value Ref Range   WBC 10.5 4.0 - 10.5 K/uL   RBC 4.61 3.87 - 5.11 MIL/uL   Hemoglobin 12.6 12.0 - 15.0 g/dL   HCT 37.2 36.0 - 46.0 %   MCV 80.7 80.0 - 100.0 fL   MCH 27.3 26.0 - 34.0 pg   MCHC 33.9 30.0 - 36.0 g/dL   RDW 15.1 11.5 - 15.5 %   Platelets 191 150 - 400 K/uL   nRBC 0.0 0.0 - 0.2 %   Neutrophils Relative % 87 %   Neutro Abs 9.2 (H) 1.7 - 7.7 K/uL   Lymphocytes Relative 8 %   Lymphs Abs 0.8 0.7 - 4.0 K/uL   Monocytes Relative 4 %   Monocytes Absolute 0.4 0.1 - 1.0 K/uL   Eosinophils Relative 0 %   Eosinophils Absolute 0.0 0.0 - 0.5 K/uL   Basophils Relative 0 %   Basophils Absolute 0.0 0.0 - 0.1 K/uL   Immature Granulocytes 1 %   Abs Immature Granulocytes 0.06 0.00 - 0.07 K/uL  Wet prep, genital     Status: None   Collection Time: 08/01/21  7:00 PM   Specimen: PATH Cytology Cervicovaginal Ancillary Only  Result Value Ref Range   Yeast Wet Prep HPF POC NONE SEEN NONE SEEN   Trich, Wet Prep NONE SEEN NONE SEEN   Clue Cells Wet Prep HPF POC NONE SEEN NONE SEEN   WBC, Wet Prep HPF POC <10 <10   Sperm NONE SEEN    US RENAL  Result Date: 08/01/2021 CLINICAL DATA:  Flank pain pregnant patient EXAM: RENAL / URINARY TRACT ULTRASOUND COMPLETE COMPARISON:  None Available. FINDINGS: Right Kidney: Renal measurements: 11.3 x 4.5 by 7.9 cm = volume: 210.6 mL. Echogenicity within normal limits. No mass or hydronephrosis visualized. Left Kidney: Renal measurements: 9.9 x 5.1 x 5.1 cm = volume: 135 mL. Echogenicity within normal limits. No mass or hydronephrosis visualized. Bladder: Appears normal for degree of bladder distention. Other: None. IMPRESSION: Negative renal ultrasound Electronically Signed   By: Donavan Foil M.D.   On: 08/01/2021 18:28   Meds ordered this encounter  Medications   HYDROmorphone (DILAUDID) injection 0.5 mg   lactated ringers bolus 1,000 mL   cyclobenzaprine (FLEXERIL) tablet 10 mg   gabapentin (NEURONTIN) capsule 300 mg   cyclobenzaprine  (FLEXERIL) 5 MG tablet    Sig: Take 1 tablet (5 mg total) by mouth 3 (three) times daily as needed for muscle spasms.    Dispense:  20 tablet    Refill:  0    Order Specific Question:   Supervising Provider    Answer:   Donnamae Jude [[id number]]   cyclobenzaprine (FLEXERIL) 5 MG tablet    Sig: Take 1 tablet (5 mg total) by mouth 3 (three) times daily as needed for muscle spasms.    Dispense:  20 tablet    Refill:  0    Order Specific Question:   Supervising Provider    Answer:   Merrily Pew   Preliminary results from OB MFM Limited Ultrasound: 1 Fetus with 150 bpm heart rate, in breech presentation. AFI within normal limits. No Placental abruption or previa identified. Cervical length 3.99 cm and normal appearance by transabdominal ultrasound. Ovaries bilaterally well-appearing.   Pain unrelieved, 6/10 despite PO '10mg'$  Flexeril, 0.'5mg'$   IV Dilaudid and '300mg'$  of Gabapentin.   All labs normal. All imaging normal.   Cervical exam closed/thick/high by S. Rollene Rotunda CNM.  Repeat cervical exam by C. Milta Deiters, CNM unchanged.   MDM Low suspicion for Urinary related infections, and kidney stones. No signs of early labor.  Hx of chronic back pain noted in pt chart.   Assessment and Plan   1. Chronic left-sided low back pain, unspecified whether sciatica present   2. [redacted] weeks gestation of pregnancy    - Early labor precautions reviewed.  -  PO flexeril sent to outpatient pharmacy.  - Encouraged hot bath with epsom salt for comfort measures.  - Reassured patient of complete normalcy of workup today. Encouraged patient to follow up with Provider that manages her chronic back pain.  - Patient discharged home is stable condition.   Jacquiline Doe, CNM  08/01/2021, 9:37 PM

## 2021-08-02 LAB — GC/CHLAMYDIA PROBE AMP (~~LOC~~) NOT AT ARMC
Chlamydia: NEGATIVE
Comment: NEGATIVE
Comment: NORMAL
Neisseria Gonorrhea: NEGATIVE

## 2021-08-08 ENCOUNTER — Ambulatory Visit: Payer: Medicaid Other | Attending: Obstetrics & Gynecology

## 2021-08-08 ENCOUNTER — Ambulatory Visit: Payer: Medicaid Other | Admitting: *Deleted

## 2021-08-08 VITALS — BP 127/62 | HR 90

## 2021-08-08 DIAGNOSIS — O0990 Supervision of high risk pregnancy, unspecified, unspecified trimester: Secondary | ICD-10-CM

## 2021-08-08 DIAGNOSIS — O99210 Obesity complicating pregnancy, unspecified trimester: Secondary | ICD-10-CM | POA: Insufficient documentation

## 2021-08-09 ENCOUNTER — Encounter: Payer: Self-pay | Admitting: Genetics

## 2021-08-09 ENCOUNTER — Other Ambulatory Visit: Payer: Self-pay | Admitting: *Deleted

## 2021-08-09 DIAGNOSIS — Z362 Encounter for other antenatal screening follow-up: Secondary | ICD-10-CM

## 2021-08-09 DIAGNOSIS — O10912 Unspecified pre-existing hypertension complicating pregnancy, second trimester: Secondary | ICD-10-CM

## 2021-08-09 DIAGNOSIS — O99212 Obesity complicating pregnancy, second trimester: Secondary | ICD-10-CM

## 2021-08-10 ENCOUNTER — Ambulatory Visit: Payer: Medicaid Other | Attending: Obstetrics and Gynecology

## 2021-08-10 ENCOUNTER — Ambulatory Visit: Payer: Medicaid Other

## 2021-08-14 ENCOUNTER — Ambulatory Visit (INDEPENDENT_AMBULATORY_CARE_PROVIDER_SITE_OTHER): Payer: Medicaid Other | Admitting: Obstetrics & Gynecology

## 2021-08-14 VITALS — BP 120/66 | HR 100 | Wt 249.8 lb

## 2021-08-14 DIAGNOSIS — R87612 Low grade squamous intraepithelial lesion on cytologic smear of cervix (LGSIL): Secondary | ICD-10-CM

## 2021-08-14 DIAGNOSIS — O10919 Unspecified pre-existing hypertension complicating pregnancy, unspecified trimester: Secondary | ICD-10-CM

## 2021-08-14 DIAGNOSIS — O09299 Supervision of pregnancy with other poor reproductive or obstetric history, unspecified trimester: Secondary | ICD-10-CM

## 2021-08-14 DIAGNOSIS — O0990 Supervision of high risk pregnancy, unspecified, unspecified trimester: Secondary | ICD-10-CM

## 2021-08-14 DIAGNOSIS — O99210 Obesity complicating pregnancy, unspecified trimester: Secondary | ICD-10-CM

## 2021-08-14 NOTE — Patient Instructions (Signed)
[facility] [address], [place], [place] [address] [phone] Hours: Sunday Closed Monday 9AM-6PM Tuesday 9AM-6PM Wednesday 9AM-6PM Thursday 9AM-6PM Friday           9AM-6PM Saturday         10AM-1PM  

## 2021-08-14 NOTE — Progress Notes (Signed)
Patient ID: Leslie Skinner, female   DOB: 02-Aug-1995, 26 y.o.   MRN: [id number]  Chief Complaint  Patient presents with   Routine Prenatal Visit  LSIL pap  HPI Leslie Skinner is a 26 y.o. female.  [id number] No LMP recorded (lmp unknown). Patient is pregnant.  HPI  Indications: Pap smear on April 2023 showed: low-grade squamous intraepithelial neoplasia (LGSIL - encompassing HPV,mild dysplasia,CIN I). Previous colposcopy: . Prior cervical treatment: no treatment.  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 No records.  Normal/prediabetic A1Cs   Pregnancy induced hypertension    Scoliosis    Spinal headache     Past Surgical History:  Procedure Laterality Date   LOWER LEG SOFT TISSUE TUMOR EXCISION  2011   OSTEOCHONDROMA EXCISION      Family History  Problem Relation Age of Onset   Diabetes Mother    Ulcers Mother    Thyroid disease Mother    Cancer Mother    Obesity Mother    Heart disease Mother    Heart attack Mother        caused death    Diabetes Maternal Grandmother    Stroke Maternal Grandmother    Ulcers Maternal Grandmother    Diabetes Maternal Grandfather    GER disease Maternal Grandfather    Heart disease Maternal Grandfather    GER disease Sister    Diabetes Father    GER disease Father    Obesity Sister    Diabetes Paternal Grandmother    Thyroid disease Paternal Grandmother    Diabetes Paternal Grandfather    Heart disease Maternal Aunt    Diabetes Maternal Aunt    Thyroid disease Maternal Aunt    Cancer Maternal Aunt    Obesity Maternal Aunt    GER disease Maternal Aunt     Social History Social History    Tobacco Use   Smoking status: Never    Passive exposure: Yes   Smokeless tobacco: Never  Vaping Use   Vaping Use: Never used  Substance Use Topics   Alcohol use: No   Drug use: No    Allergies  Allergen Reactions   Morphine And Related Other (See Comments)    Hot flashes, can't breathe   Tramadol Other (See Comments)    Hot flashes     Current Outpatient Medications  Medication Sig Dispense Refill   acetaminophen (TYLENOL) 325 MG tablet Take 2 tablets (650 mg total) by mouth every 6 (six) hours as needed (for pain scale < 4). 30 tablet 0   aspirin EC 81 MG tablet Take 1 tablet (81 mg total) by mouth daily. Take after 12 weeks for prevention of preeclampsia later in pregnancy 300 tablet 2   cyclobenzaprine (FLEXERIL) 5 MG tablet Take 1 tablet (5 mg total) by mouth 3 (three) times daily as needed for muscle spasms. 20 tablet 0   doxylamine, Sleep, (UNISOM SLEEPTABS) 25 MG tablet Take 1 tablet (25 mg total) by mouth at bedtime as needed. 30 tablet 0   metroNIDAZOLE (FLAGYL) 500 MG tablet Take 500 mg by  mouth 3 (three) times daily.     Prenatal Vit-Fe Fumarate-FA (MULTIVITAMIN-PRENATAL) 27-0.8 MG TABS tablet Take 1 tablet by mouth daily at 12 noon.     pyridOXINE (VITAMIN B-6) 100 MG tablet Take 1 tablet (100 mg total) by mouth daily. 30 tablet 2   Blood Pressure Monitoring (BLOOD PRESSURE MONITOR AUTOMAT) DEVI 1 Device by Does not apply route daily. Automatic Blood pressure monitor with regular/large cuff. To monitor blood pressure at home on regular bases. ICD-10 Code: O09.90. (Patient not taking: Reported on 07/13/2021) 1 each 0   Blood Pressure Monitoring DEVI 1 each by Does not apply route once a week. (Patient not taking: Reported on 07/13/2021) 1 each 0   No current facility-administered medications for this visit.    Review of Systems Review of Systems  Blood pressure 120/66, pulse 100, weight 249 lb 12.8 oz (113.3 kg), unknown if currently breastfeeding.  Physical  Exam Physical Exam Vitals and nursing note reviewed. Exam conducted with a chaperone present.  Constitutional:      Appearance: She is obese.  Genitourinary:    General: Normal vulva.     Exam position: Lithotomy position.     Comments: No AWE no lesions  Neurological:     Mental Status: She is alert.  Psychiatric:        Mood and Affect: Mood normal.        Behavior: Behavior normal.    Data Reviewed Pap result  Assessment    Procedure Details  The risks and benefits of the procedure and Written informed consent obtained.  Speculum placed in vagina and excellent visualization of cervix achieved, cervix swabbed x 3 with acetic acid solution.  Specimens: no bx  Complications: none.     Plan   Colposcopy and bx PP RTC 4 weeks Ob f/u      Emeterio Reeve 08/14/2021, 4:35 PM

## 2021-08-15 ENCOUNTER — Encounter: Payer: Self-pay | Admitting: *Deleted

## 2021-08-29 ENCOUNTER — Encounter: Payer: Self-pay | Admitting: General Practice

## 2021-09-05 ENCOUNTER — Other Ambulatory Visit: Payer: Self-pay | Admitting: *Deleted

## 2021-09-05 ENCOUNTER — Ambulatory Visit: Payer: Medicaid Other | Attending: Obstetrics and Gynecology

## 2021-09-05 ENCOUNTER — Ambulatory Visit: Payer: Medicaid Other | Admitting: *Deleted

## 2021-09-05 VITALS — BP 124/67 | HR 115

## 2021-09-05 DIAGNOSIS — O09292 Supervision of pregnancy with other poor reproductive or obstetric history, second trimester: Secondary | ICD-10-CM

## 2021-09-05 DIAGNOSIS — Z362 Encounter for other antenatal screening follow-up: Secondary | ICD-10-CM | POA: Insufficient documentation

## 2021-09-05 DIAGNOSIS — E669 Obesity, unspecified: Secondary | ICD-10-CM | POA: Diagnosis not present

## 2021-09-05 DIAGNOSIS — O10912 Unspecified pre-existing hypertension complicating pregnancy, second trimester: Secondary | ICD-10-CM

## 2021-09-05 DIAGNOSIS — O0990 Supervision of high risk pregnancy, unspecified, unspecified trimester: Secondary | ICD-10-CM

## 2021-09-05 DIAGNOSIS — O99212 Obesity complicating pregnancy, second trimester: Secondary | ICD-10-CM

## 2021-09-05 DIAGNOSIS — Z3A23 23 weeks gestation of pregnancy: Secondary | ICD-10-CM

## 2021-09-05 DIAGNOSIS — O99210 Obesity complicating pregnancy, unspecified trimester: Secondary | ICD-10-CM | POA: Diagnosis present

## 2021-09-13 ENCOUNTER — Telehealth (INDEPENDENT_AMBULATORY_CARE_PROVIDER_SITE_OTHER): Payer: Medicaid Other | Admitting: Family Medicine

## 2021-09-13 DIAGNOSIS — O99283 Endocrine, nutritional and metabolic diseases complicating pregnancy, third trimester: Secondary | ICD-10-CM

## 2021-09-13 DIAGNOSIS — Z3A24 24 weeks gestation of pregnancy: Secondary | ICD-10-CM

## 2021-09-13 DIAGNOSIS — M4100 Infantile idiopathic scoliosis, site unspecified: Secondary | ICD-10-CM

## 2021-09-13 DIAGNOSIS — O10913 Unspecified pre-existing hypertension complicating pregnancy, third trimester: Secondary | ICD-10-CM

## 2021-09-13 DIAGNOSIS — O99210 Obesity complicating pregnancy, unspecified trimester: Secondary | ICD-10-CM

## 2021-09-13 DIAGNOSIS — O10919 Unspecified pre-existing hypertension complicating pregnancy, unspecified trimester: Secondary | ICD-10-CM

## 2021-09-13 DIAGNOSIS — O99893 Other specified diseases and conditions complicating puerperium: Secondary | ICD-10-CM

## 2021-09-13 DIAGNOSIS — O0993 Supervision of high risk pregnancy, unspecified, third trimester: Secondary | ICD-10-CM

## 2021-09-13 DIAGNOSIS — O99213 Obesity complicating pregnancy, third trimester: Secondary | ICD-10-CM

## 2021-09-13 DIAGNOSIS — O0990 Supervision of high risk pregnancy, unspecified, unspecified trimester: Secondary | ICD-10-CM

## 2021-09-13 DIAGNOSIS — R7303 Prediabetes: Secondary | ICD-10-CM

## 2021-09-13 MED ORDER — COMFORT FIT MATERNITY SUPP MED MISC: 1.0000 [IU] | 0 refills | Status: DC | PRN

## 2021-09-13 MED ORDER — CYCLOBENZAPRINE HCL 5 MG PO TABS: 10.0000 mg | ORAL_TABLET | Freq: Three times a day (TID) | ORAL | 3 refills | Status: DC | PRN

## 2021-09-13 NOTE — Progress Notes (Signed)
OBSTETRICS PRENATAL VIRTUAL VISIT ENCOUNTER NOTE  Provider location: [facility] [place] [facility]   Patient location: Home  I connected with Leslie Skinner on 09/13/21 at  3:55 PM EDT by MyChart Video Encounter and verified that I am speaking with the correct person using two identifiers. I discussed the limitations, risks, security and privacy concerns of performing an evaluation and management service virtually and the availability of in person appointments. I also discussed with the patient that there may be a patient responsible charge related to this service. The patient expressed understanding and agreed to proceed. Subjective:  Leslie Skinner is a 26 y.o. G3P2002 at 39w4dbeing seen today for ongoing prenatal care.  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  Patient reports  having a lot of back pain with pregnancy and working. Was using flexeril 5g TID prn which wasn't helpful. .  Contractions: Not present. Vag. Bleeding: None.  Movement: Present. Denies any leaking of fluid.   The following portions of the patient's history were reviewed and updated as appropriate: allergies, current medications, past family history, past medical history, past social history, past surgical history and problem list.   Objective:  There were no vitals filed for this visit.  Fetal Status:      Movement: Present     General:  Alert, oriented and cooperative. Patient is in no acute distress.  Respiratory: Normal respiratory effort, no problems with respiration noted  Mental Status: Normal mood and affect. Normal behavior. Normal judgment and thought content.  Rest of physical exam deferred due to type of encounter  Imaging: UKoreaMFM OB FOLLOW UP  Result Date: 09/05/2021 ----------------------------------------------------------------------  OBSTETRICS REPORT                       (Signed Final 09/05/2021 02:19 pm) ---------------------------------------------------------------------- Patient Info  ID #:       [id number]                         D.O.B.:  [birth year](26 yrs)  Name:       Leslie Skinner              Visit Date: 09/05/2021 02:04 pm ---------------------------------------------------------------------- Performed By  Attending:        RTama HighMD        Ref. Address:     [address]  [place], [place]  Performed By:     Benson [place]          [facility]                                                             [facility]                                                             [facility]  Referred By:      Osborne Oman MD ---------------------------------------------------------------------- Orders  #  Description                           Code        Ordered By  1  [place] MFM OB FOLLOW UP                   66440.34    Tama High ----------------------------------------------------------------------  #  Order #                     Accession #                Episode #  1  [id number]                   [id number]                 [id number]  ---------------------------------------------------------------------- Indications  Obesity complicating pregnancy (BMI 12)        O99.210 E66.9  Hypertension - Chronic/Pre-existing            O10.019  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 Cord Insertion:      Previously Visualized  Amniotic Fluid  AFI FV:      Within normal limits ---------------------------------------------------------------------- Biometry  BPD:     61.66  mm     G. Age:  25w 0d         92  %    CI:        75.67   %    70 - 86                                                          FL/HC:      19.3   %    19.2 - 20.8  HC:    224.73   mm     G. Age:  24w 4d         75  %    HC/AC:      1.12        1.05 - 1.21  AC:    200.54   mm     G. Age:  24w 5d         80  %    FL/BPD:     70.2   %    71 - 87  FL:      43.28  mm     G. Age:  24w 1d         63  %    FL/AC:      21.6   %    20 - 24  CER:      25.7  mm     G. Age:  23w 2d         67  %  CM:          5  mm  Est. FW:     703  gm      1 lb 9 oz     89  % ---------------------------------------------------------------------- OB History  Gravidity:    3         Term:   2 ---------------------------------------------------------------------- Gestational Age  U/S Today:     24w 4d                                        EDD:   12/22/21  Best:          23w 3d     Det. By:  Previous Ultrasound      EDD:   12/30/21                                      (05/08/21) ---------------------------------------------------------------------- Anatomy   Cranium:               Appears normal         Aortic Arch:            Appears normal  Cavum:  Appears normal         Ductal Arch:            Appears normal  Ventricles:            Previously seen        Diaphragm:              Appears normal  Choroid Plexus:        Previously seen        Stomach:                Appears normal, left                                                                        sided  Cerebellum:            Appears normal         Abdomen:                Appears normal  Posterior Fossa:       Appears normal         Abdominal Wall:         Previously seen  Nuchal Fold:           Not applicable (>25    Cord Vessels:           Appears normal ([redacted]                         wks GA)                                        vessel cord)  Face:                  Orbits and profile     Kidneys:                Appear normal                         previously seen  Lips:                  Previously seen        Bladder:                Appears normal  Thoracic:              Appears normal         Spine:                  Previously seen  Heart:                 Appears normal;        Upper Extremities:      Previously seen                         EIF left ventricle  RVOT:                  Appears normal         Lower  Extremities:      Previously seen  LVOT:                  Appears normal  Other:  Nasal bone, lenses, maxilla, and mandible, Heels and 5th digit          previously visualized. Fetus appears to be female. Technically difficult          due to maternal habitus. ---------------------------------------------------------------------- Cervix Uterus Adnexa  Adnexa  No abnormality visualized. ---------------------------------------------------------------------- Impression  Patient returned for completion of fetal anatomy .Amniotic  fluid is normal and good fetal activity is seen .Fetal biometry  is consistent with her previously-established dates .Fetal  anatomical survey was completed and  appears normal.  She has an appointment for fetal echocardiography on  09/07/21 [facility] [place]). ---------------------------------------------------------------------- Recommendations  -An appointment was made for her to return in 5 weeks for  fetal growth assessment. ----------------------------------------------------------------------                 Tama High, MD Electronically Signed Final Report   09/05/2021 02:19 pm ----------------------------------------------------------------------   Assessment and Plan:  Pregnancy: C1E7517 at 73w4d1. Supervision of high risk pregnancy, antepartum FHT and FH normal   2. Maternal morbid obesity, antepartum ([place]   3. Chronic hypertension during pregnancy, antepartum BP has been normal ASA '81mg'$  Serial UKoreafor growth  4. Infantile idiopathic scoliosis, unspecified spinal region Recommended abd support band. Increase flexeril to '10mg'$  TID  5. Pre-diabetes HgA1c 5.6  Preterm labor symptoms and general obstetric precautions including but not limited to vaginal bleeding, contractions, leaking of fluid and fetal movement were reviewed in detail with the patient. I discussed the assessment and treatment plan with the patient. The patient was provided an opportunity to ask questions and all were answered. The patient agreed with the plan and demonstrated an understanding of the instructions. The patient was advised to call back or seek an in-person office evaluation/[facility]. Please refer to After Visit Summary for other counseling recommendations.   I provided 11 minutes of face-to-face time during this encounter.  No follow-ups on file.  Future Appointments  Date Time Provider [facility] [place] 10/10/2021 10:30 [facility] 10/10/2021 10:45 AM [facility] US6 [facility] [place]for WDe[facility] Company CWest [place]

## 2021-09-13 NOTE — Progress Notes (Signed)
I connected with  Leslie Skinner on 09/13/21 at  3:55 PM EDT by MyChart Virtual Video Visit and verified that I am speaking with the correct person using two identifiers.   I discussed the limitations, risks, security and privacy concerns of performing an evaluation and management service by telephone and the availability of in person appointments. I also discussed with the patient that there may be a patient responsible charge related to this service. The patient expressed understanding and agreed to proceed.  Mena Goes, CMA 09/13/2021  3:56 PM

## 2021-10-10 ENCOUNTER — Encounter: Payer: Self-pay | Admitting: *Deleted

## 2021-10-10 ENCOUNTER — Ambulatory Visit: Payer: Medicaid Other | Attending: Obstetrics and Gynecology

## 2021-10-10 ENCOUNTER — Other Ambulatory Visit: Payer: Self-pay | Admitting: *Deleted

## 2021-10-10 ENCOUNTER — Ambulatory Visit: Payer: Medicaid Other | Admitting: *Deleted

## 2021-10-10 VITALS — BP 132/73 | HR 93

## 2021-10-10 DIAGNOSIS — O09293 Supervision of pregnancy with other poor reproductive or obstetric history, third trimester: Secondary | ICD-10-CM

## 2021-10-10 DIAGNOSIS — O99210 Obesity complicating pregnancy, unspecified trimester: Secondary | ICD-10-CM | POA: Insufficient documentation

## 2021-10-10 DIAGNOSIS — O10912 Unspecified pre-existing hypertension complicating pregnancy, second trimester: Secondary | ICD-10-CM | POA: Insufficient documentation

## 2021-10-10 DIAGNOSIS — Z3A28 28 weeks gestation of pregnancy: Secondary | ICD-10-CM

## 2021-10-10 DIAGNOSIS — O99213 Obesity complicating pregnancy, third trimester: Secondary | ICD-10-CM | POA: Diagnosis not present

## 2021-10-10 DIAGNOSIS — O10913 Unspecified pre-existing hypertension complicating pregnancy, third trimester: Secondary | ICD-10-CM

## 2021-10-10 DIAGNOSIS — O352XX0 Maternal care for (suspected) hereditary disease in fetus, not applicable or unspecified: Secondary | ICD-10-CM | POA: Diagnosis not present

## 2021-10-10 DIAGNOSIS — O10013 Pre-existing essential hypertension complicating pregnancy, third trimester: Secondary | ICD-10-CM

## 2021-10-10 DIAGNOSIS — O0990 Supervision of high risk pregnancy, unspecified, unspecified trimester: Secondary | ICD-10-CM

## 2021-10-10 DIAGNOSIS — O99212 Obesity complicating pregnancy, second trimester: Secondary | ICD-10-CM | POA: Diagnosis present

## 2021-10-10 DIAGNOSIS — E669 Obesity, unspecified: Secondary | ICD-10-CM

## 2021-10-19 ENCOUNTER — Other Ambulatory Visit: Payer: Medicaid Other

## 2021-10-19 ENCOUNTER — Other Ambulatory Visit: Payer: Self-pay

## 2021-10-19 DIAGNOSIS — O0990 Supervision of high risk pregnancy, unspecified, unspecified trimester: Secondary | ICD-10-CM

## 2021-10-20 ENCOUNTER — Inpatient Hospital Stay (HOSPITAL_COMMUNITY)
Admission: AD | Admit: 2021-10-20 | Discharge: 2021-10-21 | Disposition: A | Discharge status: Home / Self Care | Payer: Medicaid Other | Attending: Obstetrics & Gynecology | Admitting: Obstetrics & Gynecology

## 2021-10-20 DIAGNOSIS — Z3A30 30 weeks gestation of pregnancy: Secondary | ICD-10-CM | POA: Insufficient documentation

## 2021-10-20 DIAGNOSIS — O0990 Supervision of high risk pregnancy, unspecified, unspecified trimester: Secondary | ICD-10-CM

## 2021-10-20 DIAGNOSIS — K600 Acute anal fissure: Secondary | ICD-10-CM

## 2021-10-20 DIAGNOSIS — Z7982 Long term (current) use of aspirin: Secondary | ICD-10-CM | POA: Insufficient documentation

## 2021-10-20 DIAGNOSIS — K602 Anal fissure, unspecified: Secondary | ICD-10-CM | POA: Insufficient documentation

## 2021-10-20 DIAGNOSIS — O26893 Other specified pregnancy related conditions, third trimester: Secondary | ICD-10-CM | POA: Insufficient documentation

## 2021-10-20 DIAGNOSIS — K921 Melena: Secondary | ICD-10-CM | POA: Insufficient documentation

## 2021-10-20 DIAGNOSIS — O99013 Anemia complicating pregnancy, third trimester: Secondary | ICD-10-CM | POA: Insufficient documentation

## 2021-10-20 DIAGNOSIS — Z3689 Encounter for other specified antenatal screening: Secondary | ICD-10-CM

## 2021-10-20 DIAGNOSIS — R1030 Lower abdominal pain, unspecified: Secondary | ICD-10-CM

## 2021-10-20 LAB — CBC
Hematocrit: 31.2 % — ABNORMAL LOW (ref 34.0–46.6)
Hemoglobin: 10 g/dL — ABNORMAL LOW (ref 11.1–15.9)
MCH: 25.2 pg — ABNORMAL LOW (ref 26.6–33.0)
MCHC: 32.1 g/dL (ref 31.5–35.7)
MCV: 79 fL (ref 79–97)
Platelets: 218 10*3/uL (ref 150–450)
RBC: 3.97 x10E6/uL (ref 3.77–5.28)
RDW: 14.6 % (ref 11.7–15.4)
WBC: 10.9 10*3/uL — ABNORMAL HIGH (ref 3.4–10.8)

## 2021-10-20 LAB — GLUCOSE TOLERANCE, 2 HOURS W/ 1HR
Glucose, 1 hour: 166 mg/dL (ref 70–179)
Glucose, 2 hour: 90 mg/dL (ref 70–152)
Glucose, Fasting: 88 mg/dL (ref 70–91)

## 2021-10-20 LAB — RPR: RPR Ser Ql: NONREACTIVE

## 2021-10-20 LAB — HIV ANTIBODY (ROUTINE TESTING W REFLEX): HIV Screen 4th Generation wRfx: NONREACTIVE

## 2021-10-21 ENCOUNTER — Encounter (HOSPITAL_COMMUNITY): Payer: Self-pay | Admitting: Obstetrics & Gynecology

## 2021-10-21 DIAGNOSIS — K600 Acute anal fissure: Secondary | ICD-10-CM

## 2021-10-21 DIAGNOSIS — O99013 Anemia complicating pregnancy, third trimester: Secondary | ICD-10-CM

## 2021-10-21 DIAGNOSIS — K921 Melena: Secondary | ICD-10-CM | POA: Diagnosis not present

## 2021-10-21 DIAGNOSIS — R1030 Lower abdominal pain, unspecified: Secondary | ICD-10-CM | POA: Diagnosis present

## 2021-10-21 DIAGNOSIS — Z7982 Long term (current) use of aspirin: Secondary | ICD-10-CM | POA: Diagnosis not present

## 2021-10-21 DIAGNOSIS — K602 Anal fissure, unspecified: Secondary | ICD-10-CM | POA: Diagnosis not present

## 2021-10-21 DIAGNOSIS — O26893 Other specified pregnancy related conditions, third trimester: Secondary | ICD-10-CM | POA: Diagnosis present

## 2021-10-21 DIAGNOSIS — K625 Hemorrhage of anus and rectum: Secondary | ICD-10-CM | POA: Diagnosis present

## 2021-10-21 DIAGNOSIS — Z3A30 30 weeks gestation of pregnancy: Secondary | ICD-10-CM | POA: Diagnosis not present

## 2021-10-21 HISTORY — DX: Anemia complicating pregnancy, third trimester: O99.013

## 2021-10-21 LAB — URINALYSIS, ROUTINE W REFLEX MICROSCOPIC
Bilirubin Urine: NEGATIVE
Glucose, UA: NEGATIVE mg/dL
Hgb urine dipstick: NEGATIVE
Ketones, ur: NEGATIVE mg/dL
Leukocytes,Ua: NEGATIVE
Nitrite: NEGATIVE
Protein, ur: NEGATIVE mg/dL
Specific Gravity, Urine: 1.024 (ref 1.005–1.030)
pH: 5 (ref 5.0–8.0)

## 2021-10-21 MED ORDER — PHENYLEPHRINE-MINERAL OIL-PET 0.25-14-74.9 % RE OINT: 1.0000 | TOPICAL_OINTMENT | Freq: Two times a day (BID) | RECTAL | 1 refills | Status: DC | PRN

## 2021-10-21 MED ORDER — FERROUS SULFATE 325 (65 FE) MG PO TBEC
325.0000 mg | DELAYED_RELEASE_TABLET | ORAL | 1 refills | Status: DC
Start: 2021-10-21 — End: 2021-12-07

## 2021-10-21 MED ORDER — ACETAMINOPHEN 325 MG PO TABS
650.0000 mg | ORAL_TABLET | Freq: Once | ORAL | Status: AC
Start: 2021-10-21 — End: 2021-10-21
  Administered 2021-10-21: 650 mg via ORAL
  Filled 2021-10-21: qty 2

## 2021-10-21 MED ORDER — DOCUSATE SODIUM 100 MG PO CAPS: 100.0000 mg | ORAL_CAPSULE | Freq: Two times a day (BID) | ORAL | 0 refills | Status: DC

## 2021-10-21 MED ORDER — CYCLOBENZAPRINE HCL 5 MG PO TABS
10.0000 mg | ORAL_TABLET | Freq: Once | ORAL | Status: AC
  Administered 2021-10-21: 10 mg via ORAL
  Filled 2021-10-21: qty 2

## 2021-10-21 NOTE — MAU Provider Note (Signed)
History     CSN: [id number]  Arrival date and time: 10/20/21 2353   Event Date/Time   First Provider Initiated Contact with Patient 10/21/21 0036      Chief Complaint  Patient presents with   Rectal Bleeding   Abdominal Pain   Leslie Skinner is a 26 y.o. G3P2002 at 74w0dwho [facility]  She presents today for Rectal Bleeding and Abdominal Pain.  She states she has been experiencing the rectal bleeding for about 2-3 days.  She denies issues with bowel movements and is unsure if she has hemorrhoids, but states "it doesn't hurt when I wipe."  She reports taking aspiring and PNV daily.  She endorses fetal movement and denies vaginal bleeding, leaking, or discharge.  She also reports abdominal pain that started upon arrival.  She states it is in the lower abdominal area and she contributes it to "overthinking."  She endorses just leaving work at 2330 and reports drinking water throughout her shift. She rates the cramping a 4/10 and states "it's not bad."  Patient also requests to review her most recent labs as she noticed some abnormalities.   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 No records.  Normal/prediabetic A1Cs   Pregnancy induced hypertension    Scoliosis    Spinal headache     Past Surgical History:  Procedure Laterality Date    LOWER LEG SOFT TISSUE TUMOR EXCISION  2011   OSTEOCHONDROMA EXCISION      Family History  Problem Relation Age of Onset   Diabetes Mother    Ulcers Mother    Thyroid disease Mother    Cancer Mother    Obesity Mother    Heart disease Mother    Heart attack Mother        caused death    Diabetes Maternal Grandmother    Stroke Maternal Grandmother    Ulcers Maternal Grandmother    Diabetes Maternal Grandfather    GER disease Maternal Grandfather    Heart disease Maternal Grandfather    GER disease Sister    Diabetes Father    GER disease Father    Obesity Sister    Diabetes Paternal Grandmother    Thyroid disease Paternal Grandmother    Diabetes Paternal Grandfather    Heart disease Maternal Aunt    Diabetes Maternal Aunt    Thyroid disease Maternal Aunt    Cancer Maternal Aunt    Obesity Maternal Aunt    GER disease  Maternal Aunt     Social History   Tobacco Use   Smoking status: Never    Passive exposure: Yes   Smokeless tobacco: Never  Vaping Use   Vaping Use: Never used  Substance Use Topics   Alcohol use: No   Drug use: No    Allergies:  Allergies  Allergen Reactions   Morphine And Related Other (See Comments)    Hot flashes, can't breathe   Tramadol Other (See Comments)    Hot flashes     Medications Prior to Admission  Medication Sig Dispense Refill Last Dose   Blood Pressure Monitoring (BLOOD PRESSURE MONITOR AUTOMAT) DEVI 1 Device by Does not apply route daily. Automatic Blood pressure monitor with regular/large cuff. To monitor blood pressure at home on regular bases. ICD-10 Code: O09.90. 1 each 0 10/20/2021   cyclobenzaprine (FLEXERIL) 5 MG tablet Take 2 tablets (10 mg total) by mouth 3 (three) times daily as needed for muscle spasms. 60 tablet 3 Past Month   Prenatal Vit-Fe Fumarate-FA (MULTIVITAMIN-PRENATAL) 27-0.8 MG TABS tablet Take 1 tablet by mouth daily at 12 noon.   10/20/2021   pyridOXINE (VITAMIN B-6) 100 MG tablet Take 1 tablet (100 mg  total) by mouth daily. 30 tablet 2 10/20/2021   acetaminophen (TYLENOL) 325 MG tablet Take 2 tablets (650 mg total) by mouth every 6 (six) hours as needed (for pain scale < 4). 30 tablet 0 More than a month   aspirin EC 81 MG tablet Take 1 tablet (81 mg total) by mouth daily. Take after 12 weeks for prevention of preeclampsia later in pregnancy 300 tablet 2    Blood Pressure Monitoring DEVI 1 each by Does not apply route once a week. 1 each 0    Elastic Bandages & Supports (COMFORT FIT MATERNITY SUPP MED) MISC 1 Units by Does not apply route as needed. 1 each 0     Review of Systems  Constitutional:  Negative for chills and fever.  Eyes:  Negative for visual disturbance.  Gastrointestinal:  Positive for abdominal pain, anal bleeding and blood in stool. Negative for constipation, diarrhea, nausea, rectal pain and vomiting.  Genitourinary:  Negative for difficulty urinating, dysuria, vaginal bleeding and vaginal discharge.  Neurological:  Negative for dizziness, light-headedness and headaches.   Physical Exam   Blood pressure (!) 141/76, pulse 99, temperature 98.5 F (36.9 C), temperature source Oral, resp. rate 20, height '5\' 2"'$  (1.575 m), weight 118.7 kg, SpO2 99 %, unknown if currently breastfeeding.  Physical Exam Vitals reviewed.  Constitutional:      Appearance: Normal appearance. She is well-developed.  HENT:     Head: Normocephalic and atraumatic.  Eyes:     Conjunctiva/sclera: Conjunctivae normal.  Cardiovascular:     Rate and Rhythm: Normal rate.  Pulmonary:     Effort: Pulmonary effort is normal. No respiratory distress.  Abdominal:     Comments: Gravid  Genitourinary:      Comments: Small external hemorrhoid noted on anterior aspect of rectum.  No thrombosis noted. Fissure noted on posterior aspect. No active bleeding.  Internal exam deferred.  Musculoskeletal:        General: Normal range of motion.     Cervical back: Normal range of motion.  Skin:    General: Skin  is warm and dry.  Neurological:     Mental Status: She is alert and oriented to person, place, and time.  Psychiatric:        Mood and Affect: Mood normal.  Behavior: Behavior normal.     Fetal Assessment 145 bpm, Mod Var, -Decels, +Accels Toco: No ctx graphed  MAU Course   Results for orders placed or performed during the hospital encounter of 10/20/21 (from the past 24 hour(s))  Urinalysis, Routine w reflex microscopic Urine, Clean Catch     Status: Abnormal   Collection Time: 10/21/21 12:24 AM  Result Value Ref Range   Color, Urine YELLOW YELLOW   APPearance HAZY (A) CLEAR   Specific Gravity, Urine 1.024 1.005 - 1.030   pH 5.0 5.0 - 8.0   Glucose, UA NEGATIVE NEGATIVE mg/dL   Hgb urine dipstick NEGATIVE NEGATIVE   Bilirubin Urine NEGATIVE NEGATIVE   Ketones, ur NEGATIVE NEGATIVE mg/dL   Protein, ur NEGATIVE NEGATIVE mg/dL   Nitrite NEGATIVE NEGATIVE   Leukocytes,Ua NEGATIVE NEGATIVE   No results found.  MDM PE Labs: UA EFM Prescriptions Assessment and Plan  26 year old G3P2002  SIUP at 30.0 weeks Cat I FT Bloody Stool s/t Anal Fissure Abdominal Cramping Anemia  -POC Reviewed. -Exam performed and findings discussed. -Informed that blood stools likely related to anal fissure.  -Discussed management to include stool softeners, preparation h, and monitoring. -Instructed to contact office or return with onset of pain or worsening/increased blood. -Patient offered and accepts pain medication for abdominal cramping. -Will give tylenol and flexeril. -Reviewed office labs and informed of anemia.  Discussed usage of iron supplement every other day. -Cautioned that iron supplementation can cause constipation and should continue proper hydration and utilize stool softener as prescribed. -Monitor and reassess.   Maryann Conners MSN, CNM 10/21/2021, 12:36 AM   Reassessment (1:37 AM)  -Patient reports pain improved with medication. -Rx sent to pharmacy on  file. -NST reactive. -Encouraged to call primary office or return to MAU if symptoms worsen or with the onset of new symptoms. -Discharged to home in stable condition.  Maryann Conners MSN, CNM Advanced Practice Provider, [facility] [employer]

## 2021-10-21 NOTE — MAU Note (Addendum)
Pt was at work tonight - says she has bloody stools . Happened yesterday at home too. Doesn't have hemorr- Says stools are hard sometimes Also,  she has of cramps- lower back cramps and  Middle abd cramps- started at 2300.  [facility] clinic  Last sex- 1 week ago

## 2021-10-30 ENCOUNTER — Encounter: Payer: Self-pay | Admitting: Obstetrics and Gynecology

## 2021-10-31 ENCOUNTER — Encounter: Payer: Medicaid Other | Admitting: Medical

## 2021-11-07 ENCOUNTER — Ambulatory Visit: Payer: Medicaid Other

## 2021-11-20 ENCOUNTER — Ambulatory Visit: Payer: Medicaid Other | Admitting: *Deleted

## 2021-11-20 ENCOUNTER — Encounter: Payer: Self-pay | Admitting: *Deleted

## 2021-11-20 ENCOUNTER — Other Ambulatory Visit: Payer: Self-pay | Admitting: *Deleted

## 2021-11-20 ENCOUNTER — Ambulatory Visit: Payer: Medicaid Other | Attending: Obstetrics

## 2021-11-20 VITALS — BP 136/70 | HR 85

## 2021-11-20 DIAGNOSIS — O99210 Obesity complicating pregnancy, unspecified trimester: Secondary | ICD-10-CM

## 2021-11-20 DIAGNOSIS — O99213 Obesity complicating pregnancy, third trimester: Secondary | ICD-10-CM | POA: Diagnosis not present

## 2021-11-20 DIAGNOSIS — O0990 Supervision of high risk pregnancy, unspecified, unspecified trimester: Secondary | ICD-10-CM | POA: Diagnosis present

## 2021-11-20 DIAGNOSIS — O10013 Pre-existing essential hypertension complicating pregnancy, third trimester: Secondary | ICD-10-CM | POA: Diagnosis not present

## 2021-11-20 DIAGNOSIS — O10913 Unspecified pre-existing hypertension complicating pregnancy, third trimester: Secondary | ICD-10-CM

## 2021-11-20 DIAGNOSIS — O99013 Anemia complicating pregnancy, third trimester: Secondary | ICD-10-CM | POA: Insufficient documentation

## 2021-11-20 DIAGNOSIS — O352XX0 Maternal care for (suspected) hereditary disease in fetus, not applicable or unspecified: Secondary | ICD-10-CM | POA: Diagnosis not present

## 2021-11-20 DIAGNOSIS — E669 Obesity, unspecified: Secondary | ICD-10-CM

## 2021-11-20 DIAGNOSIS — O09293 Supervision of pregnancy with other poor reproductive or obstetric history, third trimester: Secondary | ICD-10-CM | POA: Diagnosis not present

## 2021-11-20 DIAGNOSIS — Z3A34 34 weeks gestation of pregnancy: Secondary | ICD-10-CM

## 2021-11-20 DIAGNOSIS — Z8774 Personal history of (corrected) congenital malformations of heart and circulatory system: Secondary | ICD-10-CM

## 2021-11-23 ENCOUNTER — Inpatient Hospital Stay (HOSPITAL_COMMUNITY)
Admission: AD | Admit: 2021-11-23 | Discharge: 2021-11-23 | Disposition: A | Discharge status: Home / Self Care | Payer: Medicaid Other | Attending: Obstetrics & Gynecology | Admitting: Obstetrics & Gynecology

## 2021-11-23 ENCOUNTER — Encounter (HOSPITAL_COMMUNITY): Payer: Self-pay | Admitting: Obstetrics & Gynecology

## 2021-11-23 DIAGNOSIS — R350 Frequency of micturition: Secondary | ICD-10-CM | POA: Insufficient documentation

## 2021-11-23 DIAGNOSIS — Z3A34 34 weeks gestation of pregnancy: Secondary | ICD-10-CM

## 2021-11-23 DIAGNOSIS — L292 Pruritus vulvae: Secondary | ICD-10-CM | POA: Diagnosis not present

## 2021-11-23 DIAGNOSIS — R109 Unspecified abdominal pain: Secondary | ICD-10-CM

## 2021-11-23 DIAGNOSIS — O26893 Other specified pregnancy related conditions, third trimester: Secondary | ICD-10-CM

## 2021-11-23 DIAGNOSIS — R300 Dysuria: Secondary | ICD-10-CM | POA: Insufficient documentation

## 2021-11-23 DIAGNOSIS — O99013 Anemia complicating pregnancy, third trimester: Secondary | ICD-10-CM

## 2021-11-23 DIAGNOSIS — O0990 Supervision of high risk pregnancy, unspecified, unspecified trimester: Secondary | ICD-10-CM

## 2021-11-23 DIAGNOSIS — N898 Other specified noninflammatory disorders of vagina: Secondary | ICD-10-CM

## 2021-11-23 LAB — POCT FERN TEST: POCT Fern Test: NEGATIVE

## 2021-11-23 LAB — WET PREP, GENITAL
Clue Cells Wet Prep HPF POC: NONE SEEN
Sperm: NONE SEEN
Trich, Wet Prep: NONE SEEN
WBC, Wet Prep HPF POC: <10 (ref <10)
Yeast Wet Prep HPF POC: NONE SEEN

## 2021-11-23 LAB — URINALYSIS, ROUTINE W REFLEX MICROSCOPIC
Bilirubin Urine: NEGATIVE
Glucose, UA: NEGATIVE mg/dL
Hgb urine dipstick: NEGATIVE
Ketones, ur: NEGATIVE mg/dL
Nitrite: NEGATIVE
Protein, ur: 30 mg/dL — AB
Specific Gravity, Urine: 1.026 (ref 1.005–1.030)
pH: 5 (ref 5.0–8.0)

## 2021-11-23 MED ORDER — FLUCONAZOLE 150 MG PO TABS
150.0000 mg | ORAL_TABLET | Freq: Once | ORAL | Status: AC
  Administered 2021-11-23: 150 mg via ORAL
  Filled 2021-11-23: qty 1

## 2021-11-23 NOTE — MAU Note (Addendum)
History     CSN: [id number]  Arrival date and time: 11/23/21 1556   None     Chief Complaint  Patient presents with   Abdominal Pain   Abdominal Pain Associated symptoms include frequency. Pertinent negatives include no constipation, diarrhea, hematuria, nausea or vomiting.    Leslie Skinner is a 26 year old female [id number] 42w5dpresenting for 1 day of pelvic pressure. Since 3 pm today, the patient has experienced 8/10 constant lower back pressure radiating to her stomach and then her pelvis. She has tried Tylenol 3 times today with no relief. She notes increased fetal movement since the onset of pain. She is unsure of contractions and loss of fluids. Denies vaginal bleeding. Denies fever, nausea, vomiting, abdominal pain, constipation and diarrhea.   She also notes 1 month of vaginal itching and thick white "cottage cheese" odorless discharge, similar to other yeast infections in the past. Her vulva is sensitive from the constant itching, causing burning with urination. She also notes urinary frequency, causing her to dribble small amounts of urine all day, to the point where she has to wear a pad.    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 No records.  Normal/prediabetic A1Cs   Pregnancy induced hypertension    Scoliosis    Spinal headache     Past Surgical History:  Procedure Laterality Date   LOWER LEG SOFT TISSUE TUMOR EXCISION  2011   OSTEOCHONDROMA EXCISION       Family History  Problem Relation Age of Onset   Diabetes Mother    Ulcers Mother    Thyroid disease Mother    Cancer Mother    Obesity Mother    Heart disease Mother    Heart attack Mother        caused death    Diabetes Maternal Grandmother    Stroke Maternal Grandmother    Ulcers Maternal Grandmother    Diabetes Maternal Grandfather    GER disease Maternal Grandfather    Heart disease Maternal Grandfather    GER disease Sister    Diabetes Father    GER disease Father    Obesity Sister    Diabetes Paternal Grandmother    Thyroid disease Paternal Grandmother    Diabetes Paternal Grandfather    Heart disease Maternal Aunt    Diabetes Maternal Aunt    Thyroid disease Maternal Aunt    Cancer Maternal Aunt    Obesity Maternal Aunt    GER disease Maternal Aunt     Social History   Tobacco Use   Smoking status: Never    Passive exposure: Yes   Smokeless tobacco:  Never  Vaping Use   Vaping Use: Never used  Substance Use Topics   Alcohol use: No   Drug use: No    Allergies:  Allergies  Allergen Reactions   Morphine And Related Other (See Comments)    Hot flashes, can't breathe   Tramadol Other (See Comments)    Hot flashes     Medications Prior to Admission  Medication Sig Dispense Refill Last Dose   aspirin EC 81 MG tablet Take 1 tablet (81 mg total) by mouth daily. Take after 12 weeks for prevention of preeclampsia later in pregnancy 300 tablet 2 11/22/2021   Prenatal Vit-Fe Fumarate-FA (MULTIVITAMIN-PRENATAL) 27-0.8 MG TABS tablet Take 1 tablet by mouth daily at 12 noon.   11/22/2021   acetaminophen (TYLENOL) 325 MG tablet Take 2 tablets (650 mg total) by mouth every 6 (six) hours as needed (for pain scale < 4). 30 tablet 0    Blood Pressure Monitoring (BLOOD PRESSURE MONITOR AUTOMAT) DEVI 1 Device by Does not apply route daily. Automatic Blood pressure monitor with regular/large cuff. To monitor blood pressure at home on regular bases. ICD-10 Code: O09.90. 1  each 0    Blood Pressure Monitoring DEVI 1 each by Does not apply route once a week. 1 each 0    cyclobenzaprine (FLEXERIL) 5 MG tablet Take 2 tablets (10 mg total) by mouth 3 (three) times daily as needed for muscle spasms. 60 tablet 3    docusate sodium (COLACE) 100 MG capsule Take 1 capsule (100 mg total) by mouth every 12 (twelve) hours. 60 capsule 0    Elastic Bandages & Supports (COMFORT FIT MATERNITY SUPP MED) MISC 1 Units by Does not apply route as needed. 1 each 0    ferrous sulfate 325 (65 FE) MG EC tablet Take 1 tablet (325 mg total) by mouth every other day. 45 tablet 1    phenylephrine-shark liver oil-mineral oil-petrolatum (PREPARATION H) 0.25-14-74.9 % rectal ointment Place 1 Application rectally 2 (two) times daily as needed for hemorrhoids. 28 g 1    pyridOXINE (VITAMIN B-6) 100 MG tablet Take 1 tablet (100 mg total) by mouth daily. 30 tablet 2     Review of Systems  HENT:  Negative for rhinorrhea, sneezing and sore throat.   Gastrointestinal:  Negative for abdominal pain, constipation, diarrhea, nausea and vomiting.  Genitourinary:  Positive for frequency, pelvic pain, vaginal discharge and vaginal pain. Negative for difficulty urinating, hematuria and vaginal bleeding.  Musculoskeletal:  Positive for back pain.   Physical Exam   Blood pressure 124/73, pulse (!) 113, temperature 98.3 F (36.8 C), resp. rate 18, height '5\' 2"'$  (1.575 m), weight 117.5 kg, unknown if currently breastfeeding.  Physical Exam Constitutional:      General: She is not in acute distress.    Appearance: She is not toxic-appearing.     Comments: Uncomfortable-appearing   HENT:     Head: Normocephalic and atraumatic.  Cardiovascular:     Rate and Rhythm: Normal rate and regular rhythm.     Heart sounds: No murmur heard.    No friction rub. No gallop.  Pulmonary:     Effort: Pulmonary effort is normal.     Breath sounds: Normal breath sounds. No wheezing, rhonchi or rales.  Abdominal:      Palpations: There is no mass.     Tenderness: There is no guarding or rebound.  Genitourinary:    Cervix: Normal.     Comments: Cervix visually closed with chunky white discharge Sterile cervical  exam with multiparous slightly open external os, closed internal os Skin:    General: Skin is warm and dry.  Neurological:     Mental Status: She is alert.     FHR Tracing Baseline 150 Variability moderate Accels present Decels absent Toco with some intermittent irritability  Cat: I NST: reactive  MAU Course  Procedures  Lab Orders         Wet prep, genital         Urinalysis, Routine w reflex microscopic Urine, Clean Catch  POCT Fern test   MDM Moderate   Assessment and Plan   Discomfort due to advancing gestational age [name] FHT are within normal limits with no contractions. The cervical os is closed on pelvic exam and fern test is negative, so the patient is not likely in preterm labor. She has already taken multiple doses of Tylenol with no relief, and reports Flexeril does not help her pain. We discussed the use of a pregnancy belt and increased rest for relief of her pain. Plan to discharge with return precautions for contractions, leakage of fluid, and heavy vaginal bleeding.  2. Vaginal discharge [name] thick white vaginal discharge and itching is similar to her last yeast infection, so will give Diflucan 150 mg for yeast infection. Plan to also order Wet prep and urine culture to rule out trich, BV, and UTI.   Paulo Fruit 11/23/2021, 5:28 PM     ATTENDING ATTESTATION  I have seen and examined this patient and agree with the above documentation in the medical student's note except as below.  Agree with above  Clarnce Flock, MD/[facility] (Faculty Practice) 11/23/2021, 6:34 PM

## 2021-11-23 NOTE — MAU Note (Addendum)
.  Leslie Skinner is a 26 y.o. at 26w5dhere in MAU reporting: increased pelvic pressure and pain that started when she came to work at [employer]. Denies any vag bleeding or discharge.Stated she has not felt baby move much today. LMP:  Onset of complaint: 3pm Pain score: 7 Vitals:   11/23/21 1614  BP: (!) 141/70  Pulse: (!) 113  Resp: 18  Temp: 98.3 F (36.8 C)     FHT:156 Lab orders placed from triage:  U/A

## 2021-11-24 ENCOUNTER — Telehealth: Payer: Medicaid Other | Admitting: Physician Assistant

## 2021-11-24 LAB — GC/CHLAMYDIA PROBE AMP (~~LOC~~) NOT AT ARMC
Chlamydia: NEGATIVE
Comment: NEGATIVE
Comment: NORMAL
Neisseria Gonorrhea: NEGATIVE

## 2021-11-24 NOTE — Progress Notes (Signed)
The patient no-showed for appointment despite this provider sending direct link x 2 with no response and waiting for at least 10 minutes from appointment time for patient to join. They will be marked as a NS for this appointment/time.   Asami Lambright M Mickenzie Stolar, PA-C    

## 2021-11-26 LAB — CULTURE, OB URINE: Culture: >=100000 — AB

## 2021-11-27 ENCOUNTER — Telehealth (INDEPENDENT_AMBULATORY_CARE_PROVIDER_SITE_OTHER): Payer: Medicaid Other | Admitting: Obstetrics and Gynecology

## 2021-11-27 DIAGNOSIS — O10919 Unspecified pre-existing hypertension complicating pregnancy, unspecified trimester: Secondary | ICD-10-CM

## 2021-11-27 DIAGNOSIS — O99013 Anemia complicating pregnancy, third trimester: Secondary | ICD-10-CM

## 2021-11-27 DIAGNOSIS — Z3A35 35 weeks gestation of pregnancy: Secondary | ICD-10-CM

## 2021-11-27 DIAGNOSIS — O99213 Obesity complicating pregnancy, third trimester: Secondary | ICD-10-CM

## 2021-11-27 DIAGNOSIS — O10913 Unspecified pre-existing hypertension complicating pregnancy, third trimester: Secondary | ICD-10-CM

## 2021-11-27 DIAGNOSIS — O0990 Supervision of high risk pregnancy, unspecified, unspecified trimester: Secondary | ICD-10-CM

## 2021-11-27 DIAGNOSIS — O99613 Diseases of the digestive system complicating pregnancy, third trimester: Secondary | ICD-10-CM

## 2021-11-27 NOTE — Progress Notes (Signed)
OBSTETRICS PRENATAL VIRTUAL VISIT ENCOUNTER NOTE  Provider location: [facility] [place] [facility] [place] [facility]   Patient location: Home  I connected with [name] on 11/27/21 at 10:15 AM EDT by MyChart Video Encounter and verified that I am speaking with the correct person using two identifiers. I discussed the limitations, risks, security and privacy concerns of performing an evaluation and management service virtually and the availability of in person appointments. I also discussed with the patient that there may be a patient responsible charge related to this service. The patient expressed understanding and agreed to proceed. Subjective:  Leslie Skinner is a 26 y.o. G3P2002 at 28w2dbeing seen today for ongoing prenatal care.  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  Patient reports backache.  Contractions: Irritability. Vag. Bleeding: None.  Movement: Present. Denies any leaking of fluid.   The following portions of the patient's history were reviewed and updated as appropriate: allergies, current medications, past family history, past medical history, past social history, past surgical history and problem list.   Objective:  There were no vitals filed for this visit.  Fetal Status:     Movement: Present     General:  Alert, oriented and  cooperative. Patient is in no acute distress.  Respiratory: Normal respiratory effort, no problems with respiration noted  Mental Status: Normal mood and affect. Normal behavior. Normal judgment and thought content.  Rest of physical exam deferred due to type of encounter  Imaging: [place]FM FETAL BPP WO NON STRESS  Result Date: 11/20/2021 ----------------------------------------------------------------------  OBSTETRICS REPORT                       (Signed Final 11/20/2021 12:06 pm) ---------------------------------------------------------------------- Patient Info  ID #:       [id number]                         D.O.B.:  [birth year](26 yrs)  Name:       Leslie Skinner              Visit Date: 11/20/2021 11:14 am ---------------------------------------------------------------------- Performed By  Attending:        BValeda MalmDO       Ref. Address:     [address]                                                            [place]  [address]  Performed By:     Margaretann Loveless     [facility]                                                             [place] [facility]                                                             [facility]  Referred By:      Osborne Oman MD ---------------------------------------------------------------------- Orders  #  Description                           Code        Ordered By  1  [place] MFM FETAL BPP WO NON               76819.01    YU FANG     STRESS  2  [place] MFM OB FOLLOW UP                   76160.73    YU FANG ----------------------------------------------------------------------  #  Order #                     Accession #                Episode #  1  [id number]                   [id number]                 [id number]  2  [id number]                   [id number]                 [id number]  ---------------------------------------------------------------------- Indications  Obesity complicating pregnancy (BMI 44)        O99.210 E66.9  Hypertension - Chronic/Pre-existing            O10.019  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 Cord Insertion:      Previously Visualized  Amniotic Fluid  AFI FV:      Within normal limits  AFI Sum(cm)     %Tile       Largest Pocket(cm)  10.72           25          3.35  RUQ(cm)       RLQ(cm)       LUQ(cm)        LLQ(cm)  2.86          1.3           3.35           3.21 ---------------------------------------------------------------------- Biophysical Evaluation  Amniotic F.V:   Pocket => 2 cm             F. Tone:        Observed  F. Movement:    Observed                   Score:          8/8  F. Breathing:   Observed ---------------------------------------------------------------------- Biometry  BPD:     91.03  mm     G. Age:  36w 6d         97  %    CI:        80.16   %    70 - 86                                                          FL/HC:      21.6   %    19.4 - 21.8  HC:      321.2  mm     G. Age:  36w 2d         65  %    HC/AC:      1.01        0.96 - 1.11  AC:    317.62   mm     G. Age:  35w 5d         88  %    FL/BPD:     76.1   %    71 - 87  FL:      69.31  mm     G. Age:  35w 4d         75  %    FL/AC:      21.8   %    20 - 24  HUM:        61  mm     G.  Age:  35w 2d         81  %  Est. FW:    2783  gm      6 lb 2 oz  87  % ---------------------------------------------------------------------- OB History  Gravidity:    3         Term:   2 ---------------------------------------------------------------------- Gestational Age  U/S Today:     36w 1d                                        EDD:   12/17/21  Best:          34w 2d     Det. By:  Previous Ultrasound      EDD:   12/30/21                                      (05/08/21) ---------------------------------------------------------------------- Anatomy  Cranium:               Appears normal         Aortic Arch:            Previously seen  Cavum:                 Appears normal         Ductal Arch:            Previously seen  Ventricles:            Previously seen        Diaphragm:              Previously seen  Choroid Plexus:        Previously seen        Stomach:                Appears normal, left                                                                        sided  Cerebellum:            Previously seen        Abdomen:                Appears normal  Posterior Fossa:       Previously seen        Abdominal Wall:         Previously seen  Nuchal Fold:           Not applicable (>08    Cord Vessels:           Previously seen                         wks GA)  Face:                  Orbits and profile     Kidneys:                Appear normal                         previously seen  Lips:  Previously seen        Bladder:                Appears normal  Thoracic:              Previously seen        Spine:                  Previously seen  Heart:                 Appears normal;        Upper Extremities:      Previously seen                         EIF left ventricle  RVOT:                  Previously seen        Lower Extremities:      Previously seen  LVOT:                  Previously seen  Other:  Nasal bone, lenses, maxilla, and mandible, Heels and 5th digit          previously visualized. Female  gender previously seen. Technically          difficult due to maternal habitus. ---------------------------------------------------------------------- Cervix Uterus Adnexa  Cervix  Not visualized (advanced GA >24wks)  Uterus  No abnormality visualized.  Right Ovary  Within normal limits.  Left Ovary  Within normal limits. ---------------------------------------------------------------------- Comments  The patient is here for a BPP and growth for CHTN, and  obesity. She is at [facility] 2d with EDD of 12/30/2021 dated by  Previous Ultrasound  (05/08/21). Other pregnancy  complications include a hx of a baby with TOF and 22q11.  Sonographic findings  Single intrauterine pregnancy.  Observed fetal cardiac activity.  Cephalic presentation.  Interval fetal anatomy appears normal.  Fetal biometry shows the estimated fetal weight at the 87  percentile.  Amniotic fluid volume: Within normal limits. AFI: 10.72 cm.  MVP: 3.35 cm.  Placenta is Anterior Fundal.  BPP is 8/8.  Recommendations  1. BBPs weekly until delivery  2. Growth ultrasounds every 4 weeks until delivery  3. Delivery around 39-[redacted] weeks gestation  4. Fetal echo was done at [facility] and was normal ----------------------------------------------------------------------                  Valeda Malm, DO Electronically Signed Final Report   11/20/2021 12:06 pm ----------------------------------------------------------------------  [place] MFM OB FOLLOW UP  Result Date: 11/20/2021 ----------------------------------------------------------------------  OBSTETRICS REPORT                       (Signed Final 11/20/2021 12:06 pm) ---------------------------------------------------------------------- Patient Info  ID #:       [id number]                          D.O.B.:  23-Apr-1995 (25 yrs)  Name:       Leslie Skinner               Visit Date: 11/20/2021 11:14 am ---------------------------------------------------------------------- Performed By  Attending:        Valeda Malm DO        Ref. Address:     [place], [place]  Performed By:     Margaretann Loveless     [facility]                                     [facility] at                                                             [place] [facility]                                                             Women  Referred By:      Osborne Oman MD ---------------------------------------------------------------------- Orders  #  Description                           Code        Ordered By  1  [place] MFM FETAL BPP WO NON               76819.01    YU FANG     STRESS  2  [place] MFM OB FOLLOW UP                   16109.60    YU FANG ----------------------------------------------------------------------  #  Order #                     Accession #                Episode #  1  [id number]                   [id number]                 [id number]  2  [id number]                   [id number]                 [id number] ---------------------------------------------------------------------- Indications  Obesity complicating pregnancy (BMI 44)        O99.210 E66.9  Hypertension - Chronic/Pre-existing            O10.019  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, antepartum (TOF; 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 Cord  Insertion:      Previously Visualized  Amniotic Fluid  AFI FV:      Within normal limits  AFI Sum(cm)     %Tile       Largest Pocket(cm)  10.72           25          3.35  RUQ(cm)       RLQ(cm)       LUQ(cm)        LLQ(cm)  2.86          1.3           3.35           3.21 ---------------------------------------------------------------------- Biophysical Evaluation  Amniotic F.V:   Pocket => 2 cm             F. Tone:        Observed  F. Movement:    Observed                   Score:          8/8  F. Breathing:   Observed ---------------------------------------------------------------------- Biometry  BPD:     91.03  mm     G. Age:  36w 6d         97  %    CI:        80.16   %    70 - 86                                                          FL/HC:      21.6   %    19.4 - 21.8  HC:      321.2  mm     G. Age:  36w 2d         65  %    HC/AC:      1.01        0.96 - 1.11  AC:    317.62   mm     G. Age:  35w 5d         88  %    FL/BPD:     76.1   %    71 - 87  FL:      69.31  mm     G. Age:  35w 4d         75  %    FL/AC:  21.8   %    20 - 24  HUM:        61  mm     G. Age:  35w 2d         81  %  Est. FW:    2783  gm      6 lb 2 oz     87  % ---------------------------------------------------------------------- OB History  Gravidity:    3         Term:   2 ---------------------------------------------------------------------- Gestational Age  U/S Today:     36w 1d                                        EDD:   12/17/21  Best:          34w 2d     Det. By:  Previous Ultrasound      EDD:   12/30/21                                      (05/08/21) ---------------------------------------------------------------------- Anatomy  Cranium:               Appears normal         Aortic Arch:            Previously seen  Cavum:                 Appears normal         Ductal Arch:            Previously seen  Ventricles:            Previously seen        Diaphragm:              Previously seen  Choroid Plexus:        Previously seen         Stomach:                Appears normal, left                                                                        sided  Cerebellum:            Previously seen        Abdomen:                Appears normal  Posterior Fossa:       Previously seen        Abdominal Wall:         Previously seen  Nuchal Fold:           Not applicable (>28    Cord Vessels:           Previously seen                         wks GA)  Face:                  Orbits and profile  Kidneys:                Appear normal                         previously seen  Lips:                  Previously seen        Bladder:                Appears normal  Thoracic:              Previously seen        Spine:                  Previously seen  Heart:                 Appears normal;        Upper Extremities:      Previously seen                         EIF left ventricle  RVOT:                  Previously seen        Lower Extremities:      Previously seen  LVOT:                  Previously seen  Other:  Nasal bone, lenses, maxilla, and mandible, Heels and 5th digit          previously visualized. Female gender previously seen. Technically          difficult due to maternal habitus. ---------------------------------------------------------------------- Cervix Uterus Adnexa  Cervix  Not visualized (advanced GA >24wks)  Uterus  No abnormality visualized.  Right Ovary  Within normal limits.  Left Ovary  Within normal limits. ---------------------------------------------------------------------- Comments  The patient is here for a BPP and growth for CHTN, and  obesity. She is at [facility] 2d with EDD of 12/30/2021 dated by  Previous Ultrasound  (05/08/21). Other pregnancy  complications include a hx of a baby with TOF and 22q11.  Sonographic findings  Single intrauterine pregnancy.  Observed fetal cardiac activity.  Cephalic presentation.  Interval fetal anatomy appears normal.  Fetal biometry shows the estimated fetal weight at the 87  percentile.  Amniotic fluid  volume: Within normal limits. AFI: 10.72 cm.  MVP: 3.35 cm.  Placenta is Anterior Fundal.  BPP is 8/8.  Recommendations  1. BBPs weekly until delivery  2. Growth ultrasounds every 4 weeks until delivery  3. Delivery around 39-[redacted] weeks gestation  4. Fetal echo was done at [facility] and was normal ----------------------------------------------------------------------                  Valeda Malm, DO Electronically Signed Final Report   11/20/2021 12:06 pm ----------------------------------------------------------------------   Assessment and Plan:  Pregnancy: G3P2002 at 14w2d1. Maternal morbid obesity, antepartum ([place]   2. Supervision of high risk pregnancy, antepartum Continue routine prenatal care  3. Anemia in pregnancy, third trimester   4. [redacted] weeks gestation of pregnancy   5. Chronic hypertension during pregnancy, antepartum Pt never picked up her BP cuff and is not checking her BP at home.  BP seen at MFM visits appears appropriate  6. Obesity, Class III, BMI 40-49.9 (morbid obesity) ([place]   Preterm labor symptoms and general obstetric precautions including but not limited to  vaginal bleeding, contractions, leaking of fluid and fetal movement were reviewed in detail with the patient. I discussed the assessment and treatment plan with the patient. The patient was provided an opportunity to ask questions and all were answered. The patient agreed with the plan and demonstrated an understanding of the instructions. The patient was advised to call back or seek an in-person office evaluation/go to [facility] at [facility] for any urgent or concerning symptoms. Please refer to After Visit Summary for other counseling recommendations.   I provided 10 minutes of face-to-face time during this encounter.  Return in about 1 week (around 12/04/2021) for [facility], in person, 36 weeks swabs.  Future Appointments  Date Time Provider [place]  11/30/2021  8:30 AM [facility] NURSE [facility]   11/30/2021  8:45 AM [facility] NST [facility]  12/07/2021 10:30 AM [facility] NURSE [facility]  12/07/2021 10:45 AM [facility] NST [facility]  12/14/2021  7:15 AM [facility] NURSE [facility]  12/14/2021  7:30 AM [facility] US2 [facility]  12/21/2021  7:30 AM [facility] NURSE [facility]  12/21/2021  7:45 AM [facility] US4 [facility]  12/28/2021  7:30 AM [facility] NURSE [facility]  12/28/2021  7:45 AM [facility] US4 [facility] [place], MD [facility] for [facility], [place]

## 2021-11-30 ENCOUNTER — Ambulatory Visit: Payer: Medicaid Other | Admitting: *Deleted

## 2021-11-30 ENCOUNTER — Ambulatory Visit: Payer: Medicaid Other | Attending: Maternal & Fetal Medicine | Admitting: *Deleted

## 2021-11-30 VITALS — BP 128/61 | HR 72

## 2021-11-30 DIAGNOSIS — Z3A35 35 weeks gestation of pregnancy: Secondary | ICD-10-CM | POA: Diagnosis not present

## 2021-11-30 DIAGNOSIS — E669 Obesity, unspecified: Secondary | ICD-10-CM | POA: Diagnosis not present

## 2021-11-30 DIAGNOSIS — O0990 Supervision of high risk pregnancy, unspecified, unspecified trimester: Secondary | ICD-10-CM

## 2021-11-30 DIAGNOSIS — O365990 Maternal care for other known or suspected poor fetal growth, unspecified trimester, not applicable or unspecified: Secondary | ICD-10-CM | POA: Diagnosis present

## 2021-11-30 DIAGNOSIS — O99213 Obesity complicating pregnancy, third trimester: Secondary | ICD-10-CM | POA: Diagnosis not present

## 2021-11-30 DIAGNOSIS — O10913 Unspecified pre-existing hypertension complicating pregnancy, third trimester: Secondary | ICD-10-CM

## 2021-11-30 DIAGNOSIS — O99210 Obesity complicating pregnancy, unspecified trimester: Secondary | ICD-10-CM

## 2021-11-30 DIAGNOSIS — O99013 Anemia complicating pregnancy, third trimester: Secondary | ICD-10-CM

## 2021-11-30 NOTE — Procedures (Signed)
Leslie Skinner [birth year] [redacted]w[redacted]d Fetus A Non-Stress Test Interpretation for 11/30/21  Indication: IUGR and Chronic Hypertenstion  Fetal Heart Rate A Mode: External Baseline Rate (A): 150 bpm Variability: Moderate Accelerations: 15 x 15 Decelerations: None Multiple birth?: No  Uterine Activity Mode: Toco Contraction Frequency (min): 10 Contraction Duration (sec): 50-80 Contraction Quality: Mild (not felt by pt) Resting Tone Palpated: Relaxed  Interpretation (Fetal Testing) Nonstress Test Interpretation: Reactive Overall Impression: Reassuring for gestational age Comments: tracing reviewed by Dr. SEpimenio Sarin

## 2021-12-07 ENCOUNTER — Ambulatory Visit: Payer: Medicaid Other | Admitting: *Deleted

## 2021-12-07 ENCOUNTER — Encounter: Payer: Self-pay | Admitting: Obstetrics and Gynecology

## 2021-12-07 ENCOUNTER — Ambulatory Visit: Payer: Medicaid Other | Attending: Maternal & Fetal Medicine | Admitting: *Deleted

## 2021-12-07 ENCOUNTER — Ambulatory Visit (INDEPENDENT_AMBULATORY_CARE_PROVIDER_SITE_OTHER): Payer: Medicaid Other | Admitting: Obstetrics and Gynecology

## 2021-12-07 ENCOUNTER — Other Ambulatory Visit: Payer: Self-pay

## 2021-12-07 VITALS — BP 123/72 | HR 88 | Wt 263.1 lb

## 2021-12-07 DIAGNOSIS — E669 Obesity, unspecified: Secondary | ICD-10-CM | POA: Diagnosis not present

## 2021-12-07 DIAGNOSIS — O10013 Pre-existing essential hypertension complicating pregnancy, third trimester: Secondary | ICD-10-CM | POA: Diagnosis not present

## 2021-12-07 DIAGNOSIS — O0993 Supervision of high risk pregnancy, unspecified, third trimester: Secondary | ICD-10-CM

## 2021-12-07 DIAGNOSIS — O99013 Anemia complicating pregnancy, third trimester: Secondary | ICD-10-CM

## 2021-12-07 DIAGNOSIS — O0990 Supervision of high risk pregnancy, unspecified, unspecified trimester: Secondary | ICD-10-CM

## 2021-12-07 DIAGNOSIS — O10913 Unspecified pre-existing hypertension complicating pregnancy, third trimester: Secondary | ICD-10-CM | POA: Diagnosis not present

## 2021-12-07 DIAGNOSIS — Z3A36 36 weeks gestation of pregnancy: Secondary | ICD-10-CM | POA: Insufficient documentation

## 2021-12-07 DIAGNOSIS — O99213 Obesity complicating pregnancy, third trimester: Secondary | ICD-10-CM | POA: Insufficient documentation

## 2021-12-07 DIAGNOSIS — O10919 Unspecified pre-existing hypertension complicating pregnancy, unspecified trimester: Secondary | ICD-10-CM

## 2021-12-07 DIAGNOSIS — O99210 Obesity complicating pregnancy, unspecified trimester: Secondary | ICD-10-CM

## 2021-12-07 MED ORDER — TERCONAZOLE 0.4 % VA CREA: 1.0000 | TOPICAL_CREAM | Freq: Every day | VAGINAL | 0 refills | Status: DC

## 2021-12-07 NOTE — Patient Instructions (Signed)
Vaginal Delivery  Vaginal delivery means that you give birth by pushing your baby out of your birth canal (vagina). Your health care team will help you before, during, and after vaginal delivery. Birth experiences are unique for every woman and every pregnancy, and birth experiences vary depending on where you choose to give birth. What are the risks and benefits? Generally, this is safe. However, problems may occur, including: Bleeding. Infection. Damage to other structures such as vaginal tearing. Allergic reactions to medicines. Despite the risks, benefits of vaginal delivery include less risk of bleeding and infection and a shorter recovery time compared to a Cesarean delivery. Cesarean delivery, or C-section, is the surgical delivery of a baby. What happens when I arrive at the birth center or hospital? Once you are in labor and have been admitted into the hospital or birth center, your health care team may: Review your pregnancy history and any concerns that you have. Talk with you about your birth plan and discuss pain control options. Check your blood pressure, breathing, and heartbeat. Assess your baby's heartbeat. Monitor your uterus for contractions. Check whether your bag of water (amniotic sac) has broken (ruptured). Insert an IV into one of your veins. This may be used to give you fluids and medicines. Monitoring Your health care team may assess your contractions (uterine monitoring) and your baby's heart rate (fetal monitoring). You may need to be monitored: Often, but not continuously (intermittently). All the time or for long periods at a time (continuously). Continuous monitoring may be needed if: You are taking certain medicines, such as medicine to relieve pain or make your contractions stronger. You have pregnancy or labor complications. Monitoring may be done by: Placing a special stethoscope or a handheld monitoring device on your abdomen to check your baby's  heartbeat and to check for contractions. Placing monitors on your abdomen (external monitors) to record your baby's heartbeat and the frequency and length of contractions. Placing monitors inside your uterus through your vagina (internal monitors) to record your baby's heartbeat and the frequency, length, and strength of your contractions. Depending on the type of monitor, it may remain in your uterus or on your baby's head until birth. Telemetry. This is a type of continuous monitoring that can be done with external or internal monitors. Instead of having to stay in bed, you are able to move around. Physical exam Your health care team may perform frequent physical exams. This may include: Checking how and where your baby is positioned in your uterus. Checking your cervix to determine: Whether it is thinning out (effacing). Whether it is opening up (dilating). What happens during labor and delivery?  Normal labor and delivery is divided into the following three stages: Stage 1 This is the longest stage of labor. Throughout this stage, you will feel contractions. Contractions generally feel mild, infrequent, and irregular at first. They get stronger, more frequent, and more regular as you move through this stage. You may have contractions about every 2-3 minutes. This stage ends when your cervix is completely dilated to 4 inches (10 cm) and completely effaced. Stage 2 This stage starts once your cervix is completely effaced and dilated and lasts until the delivery of your baby. This is the stage where you will feel an urge to push your baby out of your vagina. You may feel stretching and burning pain, especially when the widest part of your baby's head passes through the vaginal opening (crowning). Once your baby is delivered, the umbilical cord will be   clamped and cut. Timing of cutting the cord will depend on your wishes, your baby's health, and your health care provider's practices. Your baby  will be placed on your bare chest (skin-to-skin contact) in an upright position and covered with a warm blanket. If you are choosing to breastfeed, watch your baby for feeding cues, like rooting or sucking, and help the baby to your breast for his or her first feeding. Stage 3 This stage starts immediately after the birth of your baby and ends after you deliver the placenta. This stage may take anywhere from 5 to 30 minutes. After your baby has been delivered, you will feel contractions as your body expels the placenta. These contractions also help your uterus get smaller and reduce bleeding. What can I expect after labor and delivery? After labor is over, you and your baby will be assessed closely until you are ready to go home. Your health care team will teach you how to care for yourself and your baby. You and your baby may be encouraged to stay in the same room (rooming in) during your hospital stay. This will help promote early bonding and successful breastfeeding. Your uterus will be checked and massaged regularly (fundal massage). You may continue to receive fluids and medicines through an IV. You will have some soreness and pain in your abdomen, vagina, and the area of skin between your vaginal opening and your anus (perineum). If an incision was made near your vagina (episiotomy) or if you had some vaginal tearing during delivery, cold compresses may be placed on your episiotomy or your tear. This helps to reduce pain and swelling. It is normal to have vaginal bleeding after delivery. Wear a sanitary pad for vaginal bleeding and discharge. Summary Vaginal delivery means that you will give birth by pushing your baby out of your birth canal (vagina). Your health care team will monitor you and your baby throughout the stages of labor. After you deliver your baby, your health care team will continue to assess you and your baby to ensure you are both recovering as expected after delivery. This  information is not intended to replace advice given to you by your health care provider. Make sure you discuss any questions you have with your health care provider. Document Revised: 01/25/2020 Document Reviewed: 01/25/2020 Elsevier Patient Education  2023 Elsevier Inc.  

## 2021-12-07 NOTE — Progress Notes (Signed)
Subjective:  Leslie Skinner is a 26 y.o. G3P2002 at 74w5dbeing seen today for ongoing prenatal care.  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  Patient reports general discomforts of pregnancy.  Contractions: Not present. Vag. Bleeding: None.  Movement: Present. Denies leaking of fluid.   The following portions of the patient's history were reviewed and updated as appropriate: allergies, current medications, past family history, past medical history, past social history, past surgical history and problem list. Problem list updated.  Objective:   Vitals:   12/07/21 0932  BP: 123/72  Pulse: 88  Weight: 263 lb 1.6 oz (119.3 kg)    Fetal Status: Fetal Heart Rate (bpm): 166   Movement: Present     General:  Alert, oriented and cooperative. Patient is in no acute distress.  Skin: Skin is warm and dry. No rash noted.   Cardiovascular: Normal heart rate noted  Respiratory: Normal respiratory effort, no problems with respiration noted  Abdomen: Soft, gravid, appropriate for gestational age. Pain/Pressure: Absent     Pelvic:  Cervical exam deferred        Extremities: Normal range of motion.  Edema: None  Mental Status: Normal mood and affect. Normal behavior. Normal judgment and thought content.   Urinalysis:      Assessment and Plan:  Pregnancy: G3P2002 at 339w5d1.  Supervision of high risk pregnancy, antepartum Labor precautions - Culture, beta strep (group b only)  2. Chronic hypertension during pregnancy, antepartum BP stable without meds Serial growth scans and antenatal testing as per MFM  Term labor symptoms and general obstetric precautions including but not limited to vaginal bleeding, contractions, leaking of fluid and fetal movement were reviewed in detail with the patient. Please refer to After Visit Summary for other counseling recommendations.  Return in about 1 week (around 12/14/2021) for OB visit, face to face, any provider.   ErChancy MilroyMD

## 2021-12-07 NOTE — Procedures (Signed)
Leslie Skinner [birth year] [redacted]w[redacted]d Fetus A Non-Stress Test Interpretation for 12/07/21  Indication: Chronic Hypertenstion and maternal obesity  Fetal Heart Rate A Mode: External Baseline Rate (A): 150 bpm Variability: Moderate Accelerations: 15 x 15 Decelerations: None Multiple birth?: No  Uterine Activity Mode: Toco Contraction Frequency (min): irreg Contraction Duration (sec): 40-50 Contraction Quality: Mild (not felt by pt) Resting Tone Palpated: Relaxed Resting Time: Adequate  Interpretation (Fetal Testing) Nonstress Test Interpretation: Reactive Overall Impression: Reassuring for gestational age Comments: Tracing reviewed by Dr. BGertie Exon

## 2021-12-08 ENCOUNTER — Telehealth: Payer: Medicaid Other | Admitting: Emergency Medicine

## 2021-12-08 DIAGNOSIS — B001 Herpesviral vesicular dermatitis: Secondary | ICD-10-CM | POA: Diagnosis not present

## 2021-12-08 MED ORDER — VALACYCLOVIR HCL 1 G PO TABS: 2000.0000 mg | ORAL_TABLET | Freq: Two times a day (BID) | ORAL | 0 refills | Status: DC

## 2021-12-08 NOTE — Progress Notes (Signed)
Virtual Visit Consent   Leslie Skinner, you are scheduled for a virtual visit with a [place] provider today. Just as with appointments in the office, your consent must be obtained to participate. Your consent will be active for this visit and any virtual visit you may have with one of our providers in the next 365 days. If you have a MyChart account, a copy of this consent can be sent to you electronically.  As this is a virtual visit, video technology does not allow for your provider to perform a traditional examination. This may limit your provider's ability to fully assess your condition. If your provider identifies any concerns that need to be evaluated in person or the need to arrange testing (such as labs, EKG, etc.), we will make arrangements to do so. Although advances in technology are sophisticated, we cannot ensure that it will always work on either your end or our end. If the connection with a video visit is poor, the visit may have to be switched to a telephone visit. With either a video or telephone visit, we are not always able to ensure that we have a secure connection.  By engaging in this virtual visit, you consent to the provision of healthcare and authorize for your insurance to be billed (if applicable) for the services provided during this visit. Depending on your insurance coverage, you may receive a charge related to this service.  I need to obtain your verbal consent now. Are you willing to proceed with your visit today? Leslie Skinner has provided verbal consent on 12/08/2021 for a virtual visit (video or telephone). Montine Circle, PA-C  Date: 12/08/2021 10:39 AM  Virtual Visit via Video Note   I, Montine Circle, connected with  Leslie Skinner  ([id number], 06-20-1995) on 12/08/21 at 10:30 AM EDT by a video-enabled telemedicine application and verified that I am speaking with the correct person using two identifiers.  Location: Patient: Virtual Visit Location  Patient: Home Provider: Virtual Visit Location Provider: Home   I discussed the limitations of evaluation and management by telemedicine and the availability of in person appointments. The patient expressed understanding and agreed to proceed.    History of Present Illness: Leslie Skinner is a 26 y.o. who identifies as a female who was assigned female at birth, and is being seen today for cold sore.  She states that she has never had one before.  She states that she noticed that it was forming last night.  She states that it has worsened. Hasn't tried any treatments.  Patient is [redacted]w[redacted]d  Denies any other symptoms.  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 on 06/15/21 06/26/2021   Maternal morbid obesity, antepartum ([place] 06/15/2021   Previous pregnancy with congenital heart defect, currently pregnant 06/15/2021   Supervision of high risk pregnancy, antepartum 05/31/2021   History of postpartum depression 05/03/2019   Chronic bilateral thoracic back pain 08/05/2018   Current mild episode of major depressive disorder (HDem[place] 11/14/2017   Pre-diabetes 06/12/2012   Obesity, Class III, BMI 40-49.9 (morbid obesity) ([place] 08/23/2011   Scoliosis 11/22/2010   Migraine 11/08/2010   GERD (gastroesophageal reflux disease)    Chronic hypertension during pregnancy, antepartum 05/16/2010    Allergies:  Allergies  Allergen Reactions   Morphine And Related Other (See Comments)    Hot flashes, can't breathe  Tramadol Other (See Comments)    Hot flashes    Medications:  Current Outpatient Medications:    valACYclovir (VALTREX) 1000 MG tablet, Take 2 tablets (2,000 mg total) by mouth 2 (two) times daily., Disp: 4 tablet, Rfl: 0   acetaminophen (TYLENOL) 325 MG tablet, Take 2 tablets (650 mg total) by mouth every 6 (six) hours as needed (for pain scale < 4)., Disp: 30 tablet,  Rfl: 0   aspirin EC 81 MG tablet, Take 1 tablet (81 mg total) by mouth daily. Take after 12 weeks for prevention of preeclampsia later in pregnancy, Disp: 300 tablet, Rfl: 2   Prenatal Vit-Fe Fumarate-FA (MULTIVITAMIN-PRENATAL) 27-0.8 MG TABS tablet, Take 1 tablet by mouth daily at 12 noon., Disp: , Rfl:    terconazole (TERAZOL 7) 0.4 % vaginal cream, Place 1 applicator vaginally at bedtime., Disp: 45 g, Rfl: 0  Observations/Objective: Patient is well-developed, well-nourished in no acute distress.  Resting comfortably  at home.  Head is normocephalic, atraumatic.  No labored breathing.  Speech is clear and coherent with logical content.  Patient is alert and oriented at baseline.    Assessment and Plan: 1. Cold sore  Valtrex 2g BID x 1 day. Risk/benefit discussion was had with patient.  Follow Up Instructions: I discussed the assessment and treatment plan with the patient. The patient was provided an opportunity to ask questions and all were answered. The patient agreed with the plan and demonstrated an understanding of the instructions.  A copy of instructions were sent to the patient via MyChart unless otherwise noted below.     The patient was advised to call back or seek an in-person evaluation if the symptoms worsen or if the condition fails to improve as anticipated.  Time:  I spent 11 minutes with the patient via telehealth technology discussing the above problems/concerns.    Montine Circle, PA-C

## 2021-12-10 ENCOUNTER — Encounter: Payer: Self-pay | Admitting: Radiology

## 2021-12-11 LAB — CULTURE, BETA STREP (GROUP B ONLY): Strep Gp B Culture: NEGATIVE

## 2021-12-14 ENCOUNTER — Encounter: Payer: Self-pay | Admitting: *Deleted

## 2021-12-14 ENCOUNTER — Ambulatory Visit: Payer: Medicaid Other | Admitting: *Deleted

## 2021-12-14 ENCOUNTER — Encounter: Payer: Medicaid Other | Admitting: Student

## 2021-12-14 ENCOUNTER — Ambulatory Visit: Payer: Medicaid Other | Attending: Maternal & Fetal Medicine

## 2021-12-14 VITALS — BP 140/76 | HR 80

## 2021-12-14 DIAGNOSIS — O352XX0 Maternal care for (suspected) hereditary disease in fetus, not applicable or unspecified: Secondary | ICD-10-CM | POA: Diagnosis not present

## 2021-12-14 DIAGNOSIS — Z8774 Personal history of (corrected) congenital malformations of heart and circulatory system: Secondary | ICD-10-CM | POA: Insufficient documentation

## 2021-12-14 DIAGNOSIS — O99213 Obesity complicating pregnancy, third trimester: Secondary | ICD-10-CM | POA: Diagnosis not present

## 2021-12-14 DIAGNOSIS — O09293 Supervision of pregnancy with other poor reproductive or obstetric history, third trimester: Secondary | ICD-10-CM

## 2021-12-14 DIAGNOSIS — O0990 Supervision of high risk pregnancy, unspecified, unspecified trimester: Secondary | ICD-10-CM

## 2021-12-14 DIAGNOSIS — E669 Obesity, unspecified: Secondary | ICD-10-CM

## 2021-12-14 DIAGNOSIS — Z3A37 37 weeks gestation of pregnancy: Secondary | ICD-10-CM

## 2021-12-14 DIAGNOSIS — O99210 Obesity complicating pregnancy, unspecified trimester: Secondary | ICD-10-CM | POA: Insufficient documentation

## 2021-12-14 DIAGNOSIS — O10913 Unspecified pre-existing hypertension complicating pregnancy, third trimester: Secondary | ICD-10-CM | POA: Insufficient documentation

## 2021-12-14 DIAGNOSIS — O99013 Anemia complicating pregnancy, third trimester: Secondary | ICD-10-CM | POA: Diagnosis present

## 2021-12-14 DIAGNOSIS — O10013 Pre-existing essential hypertension complicating pregnancy, third trimester: Secondary | ICD-10-CM | POA: Diagnosis not present

## 2021-12-21 ENCOUNTER — Encounter: Payer: Self-pay | Admitting: *Deleted

## 2021-12-21 ENCOUNTER — Telehealth (HOSPITAL_COMMUNITY): Payer: Self-pay | Admitting: *Deleted

## 2021-12-21 ENCOUNTER — Encounter (HOSPITAL_COMMUNITY): Payer: Self-pay | Admitting: *Deleted

## 2021-12-21 ENCOUNTER — Ambulatory Visit (INDEPENDENT_AMBULATORY_CARE_PROVIDER_SITE_OTHER): Payer: Medicaid Other | Admitting: Certified Nurse Midwife

## 2021-12-21 ENCOUNTER — Ambulatory Visit: Payer: Medicaid Other | Attending: Maternal & Fetal Medicine

## 2021-12-21 ENCOUNTER — Ambulatory Visit: Payer: Medicaid Other | Admitting: *Deleted

## 2021-12-21 VITALS — BP 135/86 | HR 101 | Wt 270.8 lb

## 2021-12-21 DIAGNOSIS — O99213 Obesity complicating pregnancy, third trimester: Secondary | ICD-10-CM

## 2021-12-21 DIAGNOSIS — O99013 Anemia complicating pregnancy, third trimester: Secondary | ICD-10-CM | POA: Diagnosis present

## 2021-12-21 DIAGNOSIS — Z8774 Personal history of (corrected) congenital malformations of heart and circulatory system: Secondary | ICD-10-CM | POA: Diagnosis present

## 2021-12-21 DIAGNOSIS — B009 Herpesviral infection, unspecified: Secondary | ICD-10-CM

## 2021-12-21 DIAGNOSIS — Z3A38 38 weeks gestation of pregnancy: Secondary | ICD-10-CM

## 2021-12-21 DIAGNOSIS — Z0289 Encounter for other administrative examinations: Secondary | ICD-10-CM

## 2021-12-21 DIAGNOSIS — O0990 Supervision of high risk pregnancy, unspecified, unspecified trimester: Secondary | ICD-10-CM | POA: Diagnosis present

## 2021-12-21 DIAGNOSIS — O10013 Pre-existing essential hypertension complicating pregnancy, third trimester: Secondary | ICD-10-CM | POA: Diagnosis not present

## 2021-12-21 DIAGNOSIS — E669 Obesity, unspecified: Secondary | ICD-10-CM

## 2021-12-21 DIAGNOSIS — O10913 Unspecified pre-existing hypertension complicating pregnancy, third trimester: Secondary | ICD-10-CM

## 2021-12-21 DIAGNOSIS — O99210 Obesity complicating pregnancy, unspecified trimester: Secondary | ICD-10-CM | POA: Diagnosis present

## 2021-12-21 DIAGNOSIS — O0993 Supervision of high risk pregnancy, unspecified, third trimester: Secondary | ICD-10-CM

## 2021-12-21 DIAGNOSIS — O10919 Unspecified pre-existing hypertension complicating pregnancy, unspecified trimester: Secondary | ICD-10-CM

## 2021-12-21 DIAGNOSIS — O352XX0 Maternal care for (suspected) hereditary disease in fetus, not applicable or unspecified: Secondary | ICD-10-CM

## 2021-12-21 NOTE — Telephone Encounter (Signed)
Preadmission screen  

## 2021-12-21 NOTE — Progress Notes (Signed)
   PRENATAL VISIT NOTE  Subjective:  Leslie Skinner is a 26 y.o. G3P2002 at 67w5dbeing seen today for ongoing prenatal care.  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  Patient reports no complaints.  Contractions: Not present. Vag. Bleeding: None.  Movement: Present. Denies leaking of fluid.   The following portions of the patient's history were reviewed and updated as appropriate: allergies, current medications, past family history, past medical history, past social history, past surgical history and problem list.   Objective:   Vitals:   12/21/21 0829  BP: 135/86  Pulse: (!) 101  Weight: 270 lb 12.8 oz (122.8 kg)    Fetal Status: Fetal Heart Rate (bpm): 145 Fundal Height: 38 cm Movement: Present     General:  Alert, oriented and cooperative. Patient is in no acute distress.  Skin: Skin is warm and dry. No rash noted.   Cardiovascular: Normal heart rate noted  Respiratory: Normal respiratory effort, no problems with respiration noted  Abdomen: Soft, gravid, appropriate for gestational age.  Pain/Pressure: Absent     Pelvic: Cervical exam deferred        Extremities: Normal range of motion.     Mental Status: Normal mood and affect. Normal behavior. Normal judgment and thought content.   Assessment and Plan:  Pregnancy: G3P2002 at 356w5d. Supervision of high  risk pregnancy, antepartum - Patient feeling frequent and vigorous fetal movement.    2. Chronic hypertension during pregnancy, antepartum - BP today 135/86 and stable without medication.  - Patient request a IOL as close to the end of October as possible. BPs stable without medication. Discussed the safety and MFM recommendation of CHTN at 39 weeks.  - IOL scheduled for 12/29/21.    3. 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 Patient received a short course of Valtrex for infection.  - Patient verbalizing completing course in its entirety and denies ever having a vaginal outbreak.   Term labor symptoms and general obstetric precautions including but not limited to vaginal bleeding, contractions, leaking of fluid and fetal movement were reviewed in detail with the patient. Please refer to After Visit Summary for other counseling recommendations.   Return in about 1 week (around 12/28/2021) for LOB.  Future Appointments  Date Time Provider [place]10/19/2023  7:30 AM [facility] NURSE [facility]20[facility]  7:45 AM [facility] US4 [facility]/19/2023 10:15 AM Mercado-Ortiz, JeErnestine ConradDO [facility]10/20/2023  6:45 AM MC-LD SCHED ROOM MC-INDC None  01/04/2022 10:15 AM SiRenee HarderCNM [facility]10/26/2023 11:15 AM W[facility]-WOCA NST W[facility]-CWH WMKer[place]  Richrd Kuzniar (SIsaias SakaiPaRollene RotundaMSN, CNGreat [place] [facility]  12/21/21 1:04 PM

## 2021-12-22 ENCOUNTER — Encounter (HOSPITAL_COMMUNITY): Payer: Self-pay | Admitting: Obstetrics & Gynecology

## 2021-12-22 ENCOUNTER — Inpatient Hospital Stay (HOSPITAL_COMMUNITY)
Admission: AD | Admit: 2021-12-22 | Discharge: 2021-12-22 | Disposition: A | Discharge status: Home / Self Care | Payer: Medicaid Other | Attending: Obstetrics & Gynecology | Admitting: Obstetrics & Gynecology

## 2021-12-22 ENCOUNTER — Other Ambulatory Visit: Payer: Self-pay

## 2021-12-22 DIAGNOSIS — O26893 Other specified pregnancy related conditions, third trimester: Secondary | ICD-10-CM

## 2021-12-22 DIAGNOSIS — O10913 Unspecified pre-existing hypertension complicating pregnancy, third trimester: Secondary | ICD-10-CM | POA: Insufficient documentation

## 2021-12-22 DIAGNOSIS — O99891 Other specified diseases and conditions complicating pregnancy: Secondary | ICD-10-CM | POA: Diagnosis not present

## 2021-12-22 DIAGNOSIS — R102 Pelvic and perineal pain: Secondary | ICD-10-CM

## 2021-12-22 DIAGNOSIS — O26899 Other specified pregnancy related conditions, unspecified trimester: Secondary | ICD-10-CM

## 2021-12-22 DIAGNOSIS — Z3A38 38 weeks gestation of pregnancy: Secondary | ICD-10-CM | POA: Diagnosis not present

## 2021-12-22 DIAGNOSIS — M545 Low back pain, unspecified: Secondary | ICD-10-CM

## 2021-12-22 DIAGNOSIS — O0990 Supervision of high risk pregnancy, unspecified, unspecified trimester: Secondary | ICD-10-CM

## 2021-12-22 DIAGNOSIS — O99013 Anemia complicating pregnancy, third trimester: Secondary | ICD-10-CM

## 2021-12-22 DIAGNOSIS — O471 False labor at or after 37 completed weeks of gestation: Secondary | ICD-10-CM | POA: Insufficient documentation

## 2021-12-22 NOTE — MAU Provider Note (Signed)
History    [id number]  Arrival date and time: 12/22/21 8563    Chief Complaint  Patient presents with   Back Pain   HPI Leslie Skinner is a 26 y.o. at 10w6dwith a history of chronic hypertension by who presents for low back pain.  Started last night, radiates towards her pelvis and has felt more pelvic pressure. Pain has been constant. Has intermittent contractions which are not painful. Pain tends to improve with rest. Reports blurry vision for one episode that resolved spontaneously. She does have cHTN and is scheduled for IOL in 1 week.  Vaginal bleeding: No LOF: No Fetal Movement: Yes Contractions: No  A/Positive/-- (04/06 1112)  OB History     Gravida  3   Para  2   Term  2   Preterm      AB  0   Living  2      SAB  0   IAB      Ectopic      Multiple  0   Live Births  2           Past Medical History:  Diagnosis Date   Acanthosis nigricans, acquired    Anxiety state 06/12/2012   GERD (gastroesophageal reflux disease)    Goiter    Hypertension    LGSIL on Pap smear of cervix with +HRHPV on 06/15/21 06/26/2021   Migraines    Obesity    Osteochondroma of lower leg    Pollen allergies    Prediabetes    Reports having DM as a child, but not when older? No records.  Normal/prediabetic A1Cs   Pregnancy induced hypertension    Scoliosis    Spinal headache     Past Surgical History:  Procedure Laterality Date   LOWER LEG SOFT TISSUE TUMOR EXCISION  2011   OSTEOCHONDROMA EXCISION      Family History  Problem Relation Age of Onset   Diabetes Mother    Ulcers Mother    Thyroid disease Mother    Cancer Mother    Obesity Mother    Heart disease Mother    Heart attack Mother        caused death    Diabetes Maternal Grandmother    Stroke Maternal Grandmother    Ulcers Maternal Grandmother    Diabetes Maternal Grandfather    GER disease Maternal Grandfather    Heart disease Maternal Grandfather    GER disease Sister    Diabetes  Father    GER disease Father    Obesity Sister    Diabetes Paternal Grandmother    Thyroid disease Paternal Grandmother    Diabetes Paternal Grandfather    Heart disease Maternal Aunt    Diabetes Maternal Aunt    Thyroid disease Maternal Aunt    Cancer Maternal Aunt    Obesity Maternal Aunt    GER disease Maternal Aunt     Social History   Socioeconomic History   Marital status: Single    Spouse name: Not on file   Number of children: 1   Years of education: 12th   Highest education level: High school graduate  Occupational History   Not on file  Tobacco Use   Smoking status: Never    Passive exposure: Yes   Smokeless tobacco: Never  Vaping Use   Vaping Use: Never used  Substance and Sexual Activity   Alcohol use: No   Drug use: No   Sexual activity: Not Currently  Birth control/protection: None  Other Topics Concern   Not on file  Social History Narrative   Lives with Mom [name]), sister Joelene Millin Minor 1993), and nephew ([name], [name] 2011).   12th grade at [employer] did not graduate- will finish fall 2014.      Patient is living with her sisters Joelene Millin Minor and Janett Billow Minor) as of May 2020. She has her one year old son [name]. Patient reported she has been do depressed/down that she went to live with siblings. She really only talk to her one year son, which he is really not talking. Derl Barrow, RN 12/15/18   Social Determinants of Health   Financial Resource Strain: Low Risk  (10/22/2017)   Overall Financial Resource Strain (CARDIA)    Difficulty of Paying Living Expenses: Not hard at all  Food Insecurity: Food Insecurity Present (08/15/2021)   Hunger Vital Sign    Worried About Running Out of Food in the Last Year: Often true    Ran Out of Food in the Last Year: Sometimes true  Transportation Needs: No Transportation Needs (08/15/2021)   PRAPARE - Hydrologist (Medical): No    Lack of  Transportation (Non-Medical): No  Physical Activity: Inactive (12/15/2018)   Exercise Vital Sign    Days of Exercise per Week: 0 days    Minutes of Exercise per Session: 0 min  Stress: Stress Concern Present (12/15/2018)   [place]    Feeling of Stress : Very much  Social Connections: Moderately Isolated (12/15/2018)   Social Connection and Isolation Panel [NHANES]    Frequency of Communication with Friends and Family: More than three times a week    Frequency of Social Gatherings with Friends and Family: More than three times a week    Attends Religious Services: 1 to 4 times per year    Active Member of [employer] or Organizations: No    Attends Archivist Meetings: Never    Marital Status: Never married  Intimate Partner Violence: Not At Risk (10/22/2017)   Humiliation, Afraid, Rape, and Kick questionnaire    Fear of Current or Ex-Partner: No    Emotionally Abused: No    Physically Abused: No    Sexually Abused: No    Allergies  Allergen Reactions   Morphine And Related Other (See Comments)    Hot flashes, can't breathe   Tramadol Other (See Comments)    Hot flashes    Pitocin [Oxytocin] Itching and Rash    Per patient it occurred the last time she was induced.    No current facility-administered medications on file prior to encounter.   Current Outpatient Medications on File Prior to Encounter  Medication Sig Dispense Refill   acetaminophen (TYLENOL) 325 MG tablet Take 2 tablets (650 mg total) by mouth every 6 (six) hours as needed (for pain scale < 4). 30 tablet 0   aspirin EC 81 MG tablet Take 1 tablet (81 mg total) by mouth daily. Take after 12 weeks for prevention of preeclampsia later in pregnancy 300 tablet 2   cyclobenzaprine (FLEXERIL) 5 MG tablet Take 5 mg by mouth 3 (three) times daily as needed for muscle spasms.     valACYclovir (VALTREX) 1000 MG tablet Take 2 tablets (2,000 mg total) by  mouth 2 (two) times daily. 4 tablet 0   Prenatal Vit-Fe Fumarate-FA (MULTIVITAMIN-PRENATAL) 27-0.8 MG TABS tablet Take 1 tablet by mouth daily  at 12 noon.     terconazole (TERAZOL 7) 0.4 % vaginal cream Place 1 applicator vaginally at bedtime. 45 g 0     Review of Systems  Constitutional:  Negative for chills and fever.  Eyes:  Negative for blurred vision.  Cardiovascular:  Negative for leg swelling.  Gastrointestinal:  Negative for abdominal pain and vomiting.  Genitourinary:  Negative for dysuria, frequency and urgency.  Musculoskeletal:  Negative for myalgias.  Skin:  Negative for itching.  Neurological:  Negative for dizziness.   Pertinent positives and negative per HPI, all others reviewed and negative  Physical Exam   BP 136/69   Pulse 96   Temp 97.8 F (36.6 C) (Oral)   Resp 18   Ht '5\' 2"'$  (1.575 m)   Wt 122.7 kg   LMP  (LMP Unknown)   SpO2 99%   BMI 49.46 kg/m   Patient Vitals for the past 24 hrs:  BP Temp Temp src Pulse Resp SpO2 Height Weight  12/22/21 1801 136/69 -- -- 96 -- 99 % -- --  12/22/21 1705 132/73 -- -- 93 -- 99 % -- --  12/22/21 1643 139/71 97.8 F (36.6 C) Oral 93 18 98 % -- --  12/22/21 1638 -- -- -- -- -- -- '5\' 2"'$  (1.575 m) 122.7 kg    Physical Exam Vitals reviewed.  Constitutional:      General: She is not in acute distress.    Appearance: She is well-developed. She is not toxic-appearing.  HENT:     Head: Normocephalic and atraumatic.     Mouth/Throat:     Mouth: Mucous membranes are moist.  Eyes:     Extraocular Movements: Extraocular movements intact.  Cardiovascular:     Rate and Rhythm: Normal rate.  Pulmonary:     Effort: Pulmonary effort is normal. No respiratory distress.  Abdominal:     Palpations: Abdomen is soft.  Musculoskeletal:        General: Tenderness (low back) present.  Skin:    General: Skin is warm and dry.  Neurological:     Mental Status: She is alert and oriented to person, place, and time.  Psychiatric:         Mood and Affect: Mood normal.        Behavior: Behavior normal.    Cervical Exam Dilation: Closed Exam by:: Dr. Trina Ao, MD  FHT Baseline 150bpm, moderate variability, >15 X 15 accels, no decels Toco: every 5- 7 mins, lots of uterine irritability Reactive NST  Labs No results found for this or any previous visit (from the past 24 hour(s)).  Imaging No results found.  MAU Course  Procedures Lab Orders  No laboratory test(s) ordered today   No orders of the defined types were placed in this encounter.  Imaging Orders  No imaging studies ordered today    MDM moderate  Assessment and Plan  [redacted] weeks gestation of pregnancy  Low back pain during pregnancy in third trimester  Pelvic pressure in pregnancy  Suspect discomfort from fetal descent in the pelvis, possibly early labor.  Cervix is closed. Recommend relative rest, hydration. Can use a back brace and OTC tylenol as needed to help with symptoms.  #FWB FHT Cat 1  Dispo: discharged to home in stable condition.    Stormy Card, MD/MPH 12/22/21 8:37 PM

## 2021-12-22 NOTE — MAU Note (Signed)
Leslie Skinner is a 26 y.o. at 44w6dhere in MAU reporting: last night started having back pain that radiates to her pelvis. States it got worse while she was at work today. Pain occurs with standing and goes away with rest. No bleeding or LOF. +FM. Had one brief instance of blurry vision.   Onset of complaint: last night  Pain score: 8/10  Vitals:   12/22/21 1643  BP: 139/71  Pulse: 93  Resp: 18  Temp: 97.8 F (36.6 C)  SpO2: 98%     FHT:165  Lab orders placed from triage: none

## 2021-12-24 ENCOUNTER — Other Ambulatory Visit: Payer: Self-pay | Admitting: Advanced Practice Midwife

## 2021-12-28 ENCOUNTER — Ambulatory Visit (HOSPITAL_BASED_OUTPATIENT_CLINIC_OR_DEPARTMENT_OTHER): Payer: Medicaid Other

## 2021-12-28 ENCOUNTER — Ambulatory Visit: Payer: Medicaid Other | Admitting: *Deleted

## 2021-12-28 ENCOUNTER — Ambulatory Visit (INDEPENDENT_AMBULATORY_CARE_PROVIDER_SITE_OTHER): Payer: Medicaid Other | Admitting: Family Medicine

## 2021-12-28 ENCOUNTER — Other Ambulatory Visit: Payer: Self-pay | Admitting: Maternal & Fetal Medicine

## 2021-12-28 VITALS — BP 135/89 | HR 93 | Wt 267.2 lb

## 2021-12-28 VITALS — BP 139/72 | HR 86

## 2021-12-28 DIAGNOSIS — O99213 Obesity complicating pregnancy, third trimester: Secondary | ICD-10-CM

## 2021-12-28 DIAGNOSIS — O0990 Supervision of high risk pregnancy, unspecified, unspecified trimester: Secondary | ICD-10-CM

## 2021-12-28 DIAGNOSIS — O10913 Unspecified pre-existing hypertension complicating pregnancy, third trimester: Secondary | ICD-10-CM

## 2021-12-28 DIAGNOSIS — O10919 Unspecified pre-existing hypertension complicating pregnancy, unspecified trimester: Secondary | ICD-10-CM

## 2021-12-28 DIAGNOSIS — Z3A39 39 weeks gestation of pregnancy: Secondary | ICD-10-CM

## 2021-12-28 DIAGNOSIS — E669 Obesity, unspecified: Secondary | ICD-10-CM | POA: Diagnosis not present

## 2021-12-28 DIAGNOSIS — Z8774 Personal history of (corrected) congenital malformations of heart and circulatory system: Secondary | ICD-10-CM | POA: Insufficient documentation

## 2021-12-28 DIAGNOSIS — O99013 Anemia complicating pregnancy, third trimester: Secondary | ICD-10-CM | POA: Insufficient documentation

## 2021-12-28 DIAGNOSIS — O09293 Supervision of pregnancy with other poor reproductive or obstetric history, third trimester: Secondary | ICD-10-CM

## 2021-12-28 DIAGNOSIS — O99210 Obesity complicating pregnancy, unspecified trimester: Secondary | ICD-10-CM | POA: Insufficient documentation

## 2021-12-28 DIAGNOSIS — O10013 Pre-existing essential hypertension complicating pregnancy, third trimester: Secondary | ICD-10-CM | POA: Diagnosis not present

## 2021-12-28 DIAGNOSIS — O352XX0 Maternal care for (suspected) hereditary disease in fetus, not applicable or unspecified: Secondary | ICD-10-CM

## 2021-12-28 MED ORDER — NIFEDIPINE ER OSMOTIC RELEASE 30 MG PO TB24: 30.0000 mg | ORAL_TABLET | Freq: Every day | ORAL | 2 refills | Status: DC

## 2021-12-28 NOTE — Progress Notes (Signed)
   PRENATAL VISIT NOTE  Subjective:  Leslie Skinner is a 26 y.o. G3P2002 at 26w5dbeing seen today for ongoing prenatal care.  She is currently monitored for the following issues for this high-risk pregnancy and has Chronic hypertension during pregnancy, antepartum; GERD (gastroesophageal reflux disease); Migraine; Scoliosis; Obesity, Class III, BMI 40-49.9 (morbid obesity) ([place]; Pre-diabetes; Current mild episode of major depressive disorder ([place]; Chronic bilateral thoracic back pain; History of postpartum depression; Supervision of high risk pregnancy, antepartum; Maternal morbid obesity, antepartum ([place]; Previous pregnancy with congenital heart defect, currently pregnant; LGSIL on Pap smear of cervix with +HRHPV on 06/15/21; and Anemia in pregnancy, third trimester on their problem list.  Patient reports no complaints.  Contractions: Irritability. Vag. Bleeding: None.  Movement: Present. Denies leaking of fluid.   The following portions of the patient's history were reviewed and updated as appropriate: allergies, current medications, past family history, past medical history, past social history, past surgical history and problem list.   Objective:   Vitals:   12/28/21 1045  BP: 135/89  Pulse: 93  Weight: 267 lb 3.2 oz (121.2 kg)    Fetal Status: Fetal Heart Rate (bpm): 145   Movement: Present     General:  Alert, oriented and cooperative. Patient is in no acute distress.  Skin: Skin is warm and dry. No rash noted.   Cardiovascular: Normal heart rate noted  Respiratory: Normal respiratory effort, no problems with respiration noted  Abdomen: Soft, gravid, appropriate for gestational age.  Pain/Pressure: Present     Pelvic: Declined exam         Extremities: Normal range of motion.  Edema: None  Mental Status: Normal mood and affect. Normal behavior. Normal judgment and thought content.   Assessment and Plan:  Pregnancy: G3P2002 at 381w5d1. Supervision of high risk pregnancy,  antepartum  2. Chronic hypertension during pregnancy, antepartum - NIFEdipine (PROCARDIA XL) 30 MG 24 hr tablet; Take 1 tablet (30 mg total) by mouth daily.  Dispense: 30 tablet; Refill: 2   BP elevated above goal, started procardia. IOL scheduled and labor precautions given.   Term labor symptoms and general obstetric precautions including but not limited to vaginal bleeding, contractions, leaking of fluid and fetal movement were reviewed in detail with the patient. Please refer to After Visit Summary for other counseling recommendations.    Future Appointments  Date Time Provider [place]10/20/2023  6:45 AM [facility] SCHED ROOM [facility] None  01/29/2022  9:35 AM Mercado-Ortiz, JeErnestine ConradDO [facility] WMK[place]DONew[place], Faculty practice [facility] [facility] 12/28/21  11:36 AM

## 2021-12-29 ENCOUNTER — Inpatient Hospital Stay (HOSPITAL_COMMUNITY)
Admission: RE | Admit: 2021-12-29 | Discharge: 2022-01-01 | DRG: 807 | Disposition: A | Discharge status: Home / Self Care | Payer: Medicaid Other | Attending: Obstetrics and Gynecology | Admitting: Obstetrics and Gynecology

## 2021-12-29 ENCOUNTER — Encounter (HOSPITAL_COMMUNITY): Payer: Self-pay | Admitting: Obstetrics and Gynecology

## 2021-12-29 ENCOUNTER — Inpatient Hospital Stay (HOSPITAL_COMMUNITY): Payer: Medicaid Other | Admitting: Anesthesiology

## 2021-12-29 ENCOUNTER — Inpatient Hospital Stay (HOSPITAL_COMMUNITY): Payer: Medicaid Other

## 2021-12-29 DIAGNOSIS — O1002 Pre-existing essential hypertension complicating childbirth: Principal | ICD-10-CM | POA: Diagnosis present

## 2021-12-29 DIAGNOSIS — O10919 Unspecified pre-existing hypertension complicating pregnancy, unspecified trimester: Secondary | ICD-10-CM | POA: Diagnosis present

## 2021-12-29 DIAGNOSIS — Z3A40 40 weeks gestation of pregnancy: Secondary | ICD-10-CM | POA: Diagnosis not present

## 2021-12-29 DIAGNOSIS — O3663X0 Maternal care for excessive fetal growth, third trimester, not applicable or unspecified: Secondary | ICD-10-CM | POA: Diagnosis present

## 2021-12-29 DIAGNOSIS — O1092 Unspecified pre-existing hypertension complicating childbirth: Secondary | ICD-10-CM | POA: Diagnosis not present

## 2021-12-29 DIAGNOSIS — O0990 Supervision of high risk pregnancy, unspecified, unspecified trimester: Secondary | ICD-10-CM

## 2021-12-29 DIAGNOSIS — O9902 Anemia complicating childbirth: Secondary | ICD-10-CM | POA: Diagnosis not present

## 2021-12-29 DIAGNOSIS — O99013 Anemia complicating pregnancy, third trimester: Secondary | ICD-10-CM

## 2021-12-29 DIAGNOSIS — Z3A39 39 weeks gestation of pregnancy: Secondary | ICD-10-CM | POA: Diagnosis not present

## 2021-12-29 DIAGNOSIS — O99214 Obesity complicating childbirth: Secondary | ICD-10-CM | POA: Diagnosis present

## 2021-12-29 LAB — CBC
HCT: 32.6 % — ABNORMAL LOW (ref 36.0–46.0)
HCT: 30.9 % — ABNORMAL LOW (ref 36.0–46.0)
Hemoglobin: 10.4 g/dL — ABNORMAL LOW (ref 12.0–15.0)
Hemoglobin: 9.9 g/dL — ABNORMAL LOW (ref 12.0–15.0)
MCH: 23.2 pg — ABNORMAL LOW (ref 26.0–34.0)
MCH: 22.7 pg — ABNORMAL LOW (ref 26.0–34.0)
MCHC: 31.9 g/dL (ref 30.0–36.0)
MCHC: 32 g/dL (ref 30.0–36.0)
MCV: 72.6 fL — ABNORMAL LOW (ref 80.0–100.0)
MCV: 70.7 fL — ABNORMAL LOW (ref 80.0–100.0)
Platelets: 278 10*3/uL (ref 150–400)
Platelets: 237 10*3/uL (ref 150–400)
RBC: 4.49 MIL/uL (ref 3.87–5.11)
RBC: 4.37 MIL/uL (ref 3.87–5.11)
RDW: 17.6 % — ABNORMAL HIGH (ref 11.5–15.5)
RDW: 17.4 % — ABNORMAL HIGH (ref 11.5–15.5)
WBC: 13.3 10*3/uL — ABNORMAL HIGH (ref 4.0–10.5)
WBC: 12.5 10*3/uL — ABNORMAL HIGH (ref 4.0–10.5)
nRBC: 0 % (ref 0.0–0.2)
nRBC: 0 % (ref 0.0–0.2)

## 2021-12-29 LAB — PROTEIN / CREATININE RATIO, URINE
Creatinine, Urine: 226 mg/dL
Protein Creatinine Ratio: 0.11 mg/mg{Cre} (ref 0.00–0.15)
Total Protein, Urine: 24 mg/dL

## 2021-12-29 LAB — COMPREHENSIVE METABOLIC PANEL
ALT: 19 U/L (ref 0–44)
AST: 24 U/L (ref 15–41)
Albumin: 2.8 g/dL — ABNORMAL LOW (ref 3.5–5.0)
Alkaline Phosphatase: 205 U/L — ABNORMAL HIGH (ref 38–126)
Anion gap: 14 (ref 5–15)
BUN: 10 mg/dL (ref 6–20)
CO2: 23 mmol/L (ref 22–32)
Calcium: 9.4 mg/dL (ref 8.9–10.3)
Chloride: 100 mmol/L (ref 98–111)
Creatinine, Ser: 0.75 mg/dL (ref 0.44–1.00)
GFR, Estimated: >60 mL/min (ref >60)
Glucose, Bld: 100 mg/dL — ABNORMAL HIGH (ref 70–99)
Potassium: 3.5 mmol/L (ref 3.5–5.1)
Sodium: 137 mmol/L (ref 135–145)
Total Bilirubin: 0.8 mg/dL (ref 0.3–1.2)
Total Protein: 6.7 g/dL (ref 6.5–8.1)

## 2021-12-29 LAB — TYPE AND SCREEN
ABO/RH(D): A POS
Antibody Screen: NEGATIVE

## 2021-12-29 MED ORDER — DIPHENHYDRAMINE HCL 50 MG/ML IJ SOLN
12.5000 mg | INTRAMUSCULAR | Status: DC | PRN
  Administered 2021-12-30: 12.5 mg via INTRAVENOUS
  Filled 2021-12-29: qty 1

## 2021-12-29 MED ORDER — FENTANYL-BUPIVACAINE-NACL 0.5-0.125-0.9 MG/250ML-% EP SOLN
12.0000 mL/h | EPIDURAL | Status: DC | PRN
  Filled 2021-12-29: qty 250

## 2021-12-29 MED ORDER — LACTATED RINGERS IV SOLN
500.0000 mL | Freq: Once | INTRAVENOUS | Status: AC
  Administered 2021-12-29: 500 mL via INTRAVENOUS

## 2021-12-29 MED ORDER — EPHEDRINE 5 MG/ML INJ: 10.0000 mg | INTRAVENOUS | Status: DC | PRN

## 2021-12-29 MED ORDER — OXYCODONE-ACETAMINOPHEN 5-325 MG PO TABS: 1.0000 | ORAL_TABLET | ORAL | Status: DC | PRN

## 2021-12-29 MED ORDER — LACTATED RINGERS IV SOLN
500.0000 mL | INTRAVENOUS | Status: DC | PRN
  Administered 2021-12-30: 500 mL via INTRAVENOUS

## 2021-12-29 MED ORDER — LACTATED RINGERS IV SOLN: INTRAVENOUS | Status: DC

## 2021-12-29 MED ORDER — OXYTOCIN-SODIUM CHLORIDE 30-0.9 UT/500ML-% IV SOLN: 2.5000 [IU]/h | INTRAVENOUS | Status: DC

## 2021-12-29 MED ORDER — SOD CITRATE-CITRIC ACID 500-334 MG/5ML PO SOLN: 30.0000 mL | ORAL | Status: DC | PRN

## 2021-12-29 MED ORDER — OXYCODONE-ACETAMINOPHEN 5-325 MG PO TABS: 2.0000 | ORAL_TABLET | ORAL | Status: DC | PRN

## 2021-12-29 MED ORDER — LIDOCAINE HCL (PF) 1 % IJ SOLN
INTRAMUSCULAR | Status: DC | PRN
  Administered 2021-12-29: 5 mL via EPIDURAL

## 2021-12-29 MED ORDER — FENTANYL CITRATE (PF) 100 MCG/2ML IJ SOLN
50.0000 ug | INTRAMUSCULAR | Status: DC | PRN
  Administered 2021-12-29: 50 ug via INTRAVENOUS
  Administered 2021-12-29 (×3): 100 ug via INTRAVENOUS
  Filled 2021-12-29 (×4): qty 2

## 2021-12-29 MED ORDER — ONDANSETRON HCL 4 MG/2ML IJ SOLN: 4.0000 mg | Freq: Four times a day (QID) | INTRAMUSCULAR | Status: DC | PRN

## 2021-12-29 MED ORDER — ACETAMINOPHEN 325 MG PO TABS
650.0000 mg | ORAL_TABLET | ORAL | Status: DC | PRN
  Administered 2021-12-30: 650 mg via ORAL
  Filled 2021-12-29: qty 2

## 2021-12-29 MED ORDER — PHENYLEPHRINE 80 MCG/ML (10ML) SYRINGE FOR IV PUSH (FOR BLOOD PRESSURE SUPPORT): 80.0000 ug | PREFILLED_SYRINGE | INTRAVENOUS | Status: DC | PRN

## 2021-12-29 MED ORDER — OXYTOCIN BOLUS FROM INFUSION: 333.0000 mL | Freq: Once | INTRAVENOUS | Status: DC

## 2021-12-29 MED ORDER — FENTANYL-BUPIVACAINE-NACL 0.5-0.125-0.9 MG/250ML-% EP SOLN
EPIDURAL | Status: DC | PRN
  Administered 2021-12-29: 12 mL/h via EPIDURAL

## 2021-12-29 MED ORDER — LIDOCAINE HCL (PF) 1 % IJ SOLN: 30.0000 mL | INTRAMUSCULAR | Status: DC | PRN

## 2021-12-29 NOTE — Anesthesia Procedure Notes (Signed)
Epidural Patient location during procedure: OB Start time: 12/29/2021 8:54 PM End time: 12/29/2021 9:16 PM  Staffing Anesthesiologist: Barnet Glasgow, MD Performed: anesthesiologist   Preanesthetic Checklist Completed: patient identified, IV checked, site marked, risks and benefits discussed, surgical consent, monitors and equipment checked, pre-op evaluation and timeout performed  Epidural Patient position: sitting Prep: DuraPrep and site prepped and draped Patient monitoring: continuous pulse ox and blood pressure Approach: midline Location: L3-L4 Injection technique: LOR air  Needle:  Needle type: Tuohy  Needle gauge: 17 G Needle length: 9 cm and 9 Needle insertion depth: 5 cm cm Catheter type: closed end flexible Catheter size: 19 Gauge Catheter at skin depth: 10 cm Test dose: negative  Assessment Events: blood not aspirated, injection not painful, no injection resistance, no paresthesia and negative IV test  Additional Notes Patient identified. Risks/Benefits/Options discussed with patient including but not limited to bleeding, infection, nerve damage, paralysis, failed block, incomplete pain control, headache, blood pressure changes, nausea, vomiting, reactions to medication both or allergic, itching and postpartum back pain. Confirmed with bedside nurse the patient's most recent platelet count. Confirmed with patient that they are not currently taking any anticoagulation, have any bleeding history or any family history of bleeding disorders. Patient expressed understanding and wished to proceed. All questions were answered. Sterile technique was used throughout the entire procedure. Please see nursing notes for vital signs. Test dose was given through epidural needle and negative prior to continuing to dose epidural or start infusion. Warning signs of high block given to the patient including shortness of breath, tingling/numbness in hands, complete motor block, or any  concerning symptoms with instructions to call for help. Patient was given instructions on fall risk and not to get out of bed. All questions and concerns addressed with instructions to call with any issues.  2     Attempt (S) . Patient would not remain in position for epidural placement and repeatedly would tell me to stop once we fgot started because she was having contractions. Eventually patient stayed still enough to allow the epidural to be placed.

## 2021-12-29 NOTE — Anesthesia Preprocedure Evaluation (Addendum)
Anesthesia Evaluation  Patient identified by MRN, date of birth, ID band  Reviewed: Allergy & Precautions, NPO status , Patient's Chart, lab work & pertinent test results  Airway Mallampati: II  TM Distance: >3 FB Neck ROM: Full    Dental no notable dental hx. (+) Teeth Intact, Dental Advisory Given   Pulmonary    Pulmonary exam normal breath sounds clear to auscultation       Cardiovascular hypertension (cHtn), Normal cardiovascular exam Rhythm:Regular Rate:Normal     Neuro/Psych  Headaches,    GI/Hepatic Neg liver ROS, GERD  ,  Endo/Other    Renal/GU      Musculoskeletal   Abdominal (+) + obese (BMI   47.4),   Peds  Hematology  (+) Blood dyscrasia, anemia , Lab Results      Component                Value               Date                      WBC                      12.5 (H)            12/29/2021                HGB                      9.9 (L)             12/29/2021                HCT                      30.9 (L)            12/29/2021                MCV                      70.7 (L)            12/29/2021                PLT                      237                 12/29/2021              Anesthesia Other Findings All:  Tramadol   Reproductive/Obstetrics (+) Pregnancy                            Anesthesia Physical Anesthesia Plan  ASA: 3  Anesthesia Plan: Epidural   Post-op Pain Management:    Induction:   PONV Risk Score and Plan:   Airway Management Planned:   Additional Equipment:   Intra-op Plan:   Post-operative Plan:   Informed Consent: I have reviewed the patients History and Physical, chart, labs and discussed the procedure including the risks, benefits and alternatives for the proposed anesthesia with the patient or authorized representative who has indicated his/her understanding and acceptance.       Plan Discussed with:   Anesthesia Plan Comments: (39.6    Wk G3P2 w cHtn and BMI 47.4 for LEA)  Anesthesia Quick Evaluation  

## 2021-12-29 NOTE — Progress Notes (Signed)
Labor Progress Note Leslie Skinner is a 26 y.o. G3P2002 at 23w6dpresented for IOL cHTN  S: Comfortable after epidural   O:  BP (!) 113/43   Pulse 83   Temp 97.7 F (36.5 C) (Oral)   Resp 16   Ht '5\' 2"'$  (1.575 m)   Wt 117.6 kg   LMP  (LMP Unknown)   SpO2 96%   BMI 47.41 kg/m  EFM:  Baseline 130, moderate variability, + accels, variable decels    CVE: Dilation: 7 Effacement (%): 90 Station: -3 Presentation: Vertex Exam by:: MPilar Jarvis RN   A&P: 26y.o. [id number] as above #Labor: SROM with clear fluid, continue to monitor  #Pain: Epidural  #FWB: CAT 2, recurrent variable decels, but good moderate variability, most likely in the setting of past cervical change  #GBS negative #cHTN: BP well controlled.  AkLowry RamMD 11:46 PM

## 2021-12-29 NOTE — Progress Notes (Signed)
Labor Progress Note Leslie Skinner is a 25 y.o. G3P2002 at 89w6dpresented for IOL cHTN  S: Uncomfortable, planning epidural  O:  BP 112/73   Pulse 99   Temp 98.4 F (36.9 C) (Oral)   Resp 17   Ht '5\' 2"'$  (1.575 m)   Wt 117.6 kg   LMP  (LMP Unknown)   SpO2 96%   BMI 47.41 kg/m  EFM:  145 bpm/Moderate variability/ 15x15 accels/ None decels   CVE: Dilation: 6 Effacement (%): 80 Station: -3 Presentation: Vertex Exam by:: Dr. LKerrie Pleasure  A&P: 26y.o. [id number] as above #Labor: Progressing well. Offered AROM, but patient concerned about pain and prefers to walk #Pain: Support, IV pain meds- planning epidural #FWB: CAT 1 #GBS negative #cHTN: BP well controlled. Pre-E labs wnl.   JeShelda PalDO 7:17 PM

## 2021-12-29 NOTE — H&P (Cosign Needed Addendum)
OBSTETRIC ADMISSION HISTORY AND PHYSICAL  Leslie Skinner is a 26 y.o. female G86P2002 with IUP at 14w6dpresenting for IOL for c[place] She reports +FMs. No LOF, VB, blurry vision, headaches, peripheral edema, or RUQ pain. She plans on breastfeeding. She requests OP patch for birth control.  Dating: By 6 week [place]--->  Estimated Date of Delivery: 12/30/21  Sono:  12/28/21 '@[redacted]w[redacted]d'$ , normal anatomy, cephalic presentation, 49371I 97%ile, EFW 9lb 13 oz     Prenatal History/Complications: Macrosomia  Past Medical History: Past Medical History:  Diagnosis Date   Acanthosis nigricans, acquired    Anxiety state 06/12/2012   GERD (gastroesophageal reflux disease)    Goiter    Hypertension    LGSIL on Pap smear of cervix with +HRHPV on 06/15/21 06/26/2021   Migraines    Obesity    Osteochondroma of lower leg    Pollen allergies    Prediabetes    Reports having DM as a child, but not when older? No records.  Normal/prediabetic A1Cs   Pregnancy induced hypertension    Scoliosis    Spinal headache     Past Surgical History: Past Surgical History:  Procedure Laterality Date   LOWER LEG SOFT TISSUE TUMOR EXCISION  2011   OSTEOCHONDROMA EXCISION      Obstetrical History: OB History     Gravida  3   Para  2   Term  2   Preterm      AB  0   Living  2      SAB  0   IAB      Ectopic      Multiple  0   Live Births  2           Social History: Social History   Socioeconomic History   Marital status: Single    Spouse name: Not on file   Number of children: 1   Years of education: 12th   Highest education level: High school graduate  Occupational History   Not on file  Tobacco Use   Smoking status: Never    Passive exposure: Yes   Smokeless tobacco: Never  Vaping Use   Vaping Use: Never used  Substance and Sexual Activity   Alcohol use: No   Drug use: No   Sexual activity: Not Currently    Birth control/protection: None  Other Topics Concern   Not on  file  Social History Narrative   Lives with Mom (Leslie Skinner, sister (Leslie Skinner 1993), and nephew (Leslie Skinner son, Leslie Skinner 2011).   [employer]th [place] [employer]- will finish fall 2014.      Patient is living with her sisters (Leslie Skinner and Leslie Skinner) as of May 2020. She has her one year old son Leslie Skinner. Patient reported she has been do depressed/down that she went to live with siblings. She really only talk to her one year son, which he is really not talking. MDerl Barrow RN 12/15/18   Social Determinants of Health   Financial Resource Strain: Low Risk  (10/22/2017)   Overall Financial Resource Strain (CARDIA)    Difficulty of Paying Living Expenses: Not hard at all  Food Insecurity: Food Insecurity Present (08/15/2021)   Hunger Vital Sign    Worried About Running Out of Food in the Last Year: Often true    Ran Out of Food in the Last Year: Sometimes true  Transportation Needs: No Transportation Needs (08/15/2021)   [place]- Transportation  Lack of Transportation (Medical): No    Lack of Transportation (Non-Medical): No  Physical Activity: Inactive (12/15/2018)   Exercise Vital Sign    Days of Exercise per Week: 0 days    Minutes of Exercise per Session: 0 min  Stress: Stress Concern Present (12/15/2018)   [place]    Feeling of Stress : Very much  Social Connections: Moderately Isolated (12/15/2018)   Social Connection and Isolation Panel [NHANES]    Frequency of Communication with Friends and Family: More than three times a week    Frequency of Social Gatherings with Friends and Family: More than three times a week    Attends Religious Services: 1 to 4 times per year    Active Member of [employer] or Organizations: No    Attends Music therapist: Never    Marital Status: Never married    Family History: Family History  Problem Relation Age of Onset   Diabetes  Mother    Ulcers Mother    Thyroid disease Mother    Cancer Mother    Obesity Mother    Heart disease Mother    Heart attack Mother        caused death    Diabetes Maternal Grandmother    Stroke Maternal Grandmother    Ulcers Maternal Grandmother    Diabetes Maternal Grandfather    GER disease Maternal Grandfather    Heart disease Maternal Grandfather    GER disease Sister    Diabetes Father    GER disease Father    Obesity Sister    Diabetes Paternal Grandmother    Thyroid disease Paternal Grandmother    Diabetes Paternal Grandfather    Heart disease Maternal Aunt    Diabetes Maternal Aunt    Thyroid disease Maternal Aunt    Cancer Maternal Aunt    Obesity Maternal Aunt    GER disease Maternal Aunt     Allergies: Allergies  Allergen Reactions   Morphine And Related Other (See Comments)    Hot flashes, can't breathe   Tramadol Other (See Comments)    Hot flashes    Pitocin [Oxytocin] Itching and Rash    Per patient it occurred the last time she was induced.    Medications Prior to Admission  Medication Sig Dispense Refill Last Dose   acetaminophen (TYLENOL) 325 MG tablet Take 2 tablets (650 mg total) by mouth every 6 (six) hours as needed (for pain scale < 4). 30 tablet 0    aspirin EC 81 MG tablet Take 1 tablet (81 mg total) by mouth daily. Take after 12 weeks for prevention of preeclampsia later in pregnancy 300 tablet 2    cyclobenzaprine (FLEXERIL) 5 MG tablet Take 5 mg by mouth 3 (three) times daily as needed for muscle spasms.      NIFEdipine (PROCARDIA XL) 30 MG 24 hr tablet Take 1 tablet (30 mg total) by mouth daily. 30 tablet 2    Prenatal Vit-Fe Fumarate-FA (MULTIVITAMIN-PRENATAL) 27-0.8 MG TABS tablet Take 1 tablet by mouth daily at 12 noon.      terconazole (TERAZOL 7) 0.4 % vaginal cream Place 1 applicator vaginally at bedtime. 45 g 0    valACYclovir (VALTREX) 1000 MG tablet Take 2 tablets (2,000 mg total) by mouth 2 (two) times daily. 4 tablet 0       Review of Systems:  All systems reviewed and negative except as stated in HPI  PE: unknown if currently breastfeeding. General  appearance: alert Lungs: regular rate and effort Heart: regular rate  Abdomen: soft, non-tender Extremities: Homans sign is negative, no sign of DVT Presentation: cephalic EFM: [id number] variability/ 15x15 accels/ None decels  Toco: sporadically   SVE: 4/50/-3  Prenatal labs: ABO, Rh: A/Positive/-- (04/06 1112) Antibody: Negative (04/06 1112) Rubella: 4.73 (04/06 1112) RPR: Non Reactive (08/10 0912)  HBsAg: Negative (04/06 1112)  HIV: Non Reactive (08/10 0912)  GBS: Negative/-- (09/28 1326)  2 hr GTT normal  Prenatal Transfer Tool  Maternal Diabetes: No Genetic Screening: Normal Maternal Ultrasounds/Referrals: Normal Fetal Ultrasounds or other Referrals:  Referred to Materal Fetal Medicine  Maternal Substance Abuse:  No Significant Maternal Medications:  Meds include: Other: Valtrex, Procardia Significant Maternal Lab Results: Group B Strep negative  No results found for this or any previous visit (from the past 24 hour(s)).  Patient Active Problem List   Diagnosis Date Noted   Anemia in pregnancy, third trimester 10/21/2021   LGSIL on Pap smear of cervix with +HRHPV on 06/15/21 06/26/2021   Maternal morbid obesity, antepartum ([place]) 06/15/2021   Previous pregnancy with congenital heart defect, currently pregnant 06/15/2021   Supervision of high risk pregnancy, antepartum 05/31/2021   History of postpartum depression 05/03/2019   Chronic bilateral thoracic back pain 08/05/2018   Current mild episode of major depressive disorder ([place]) 11/14/2017   Pre-diabetes 06/12/2012   Obesity, Class III, BMI 40-49.9 (morbid obesity) ([place]) 08/23/2011   Scoliosis 11/22/2010   Migraine 11/08/2010   GERD (gastroesophageal reflux disease)    Chronic hypertension during pregnancy, antepartum 05/16/2010    Assessment: Leslie Skinner is a 26  y.o. G3P2002 at 80w6dhere for IOL for gHTN  1. Labor: Progressing well, here for IOL but found to be in labor, desires minimal augmentation 2. FWB: CAT 1 3. Pain: 4/10; Desires Nitrous oxide not epidural 4. GBS: Negative   Plan: Admit to L&D  CDeloria Lair DO  12/29/2021, 11:28 AM  ____ GME ATTESTATION:  Evaluation and management procedures were performed by the [facility] under my supervision. I was immediately available for direct supervision, assistance and direction throughout this encounter.  I also confirm that I have verified the information documented in the resident's note, and that I have also personally reperformed the pertinent components of the physical exam and all of the medical decision making activities.  I have also made any necessary editorial changes.  JShelda Pal DO OB Fellow, FSanto Domingofor WModena10/20/2023 5:35 PM

## 2021-12-30 ENCOUNTER — Encounter (HOSPITAL_COMMUNITY): Payer: Self-pay | Admitting: Obstetrics and Gynecology

## 2021-12-30 DIAGNOSIS — O9902 Anemia complicating childbirth: Secondary | ICD-10-CM

## 2021-12-30 DIAGNOSIS — O1092 Unspecified pre-existing hypertension complicating childbirth: Secondary | ICD-10-CM

## 2021-12-30 DIAGNOSIS — Z3A40 40 weeks gestation of pregnancy: Secondary | ICD-10-CM

## 2021-12-30 LAB — CBC
HCT: 27 % — ABNORMAL LOW (ref 36.0–46.0)
Hemoglobin: 8.9 g/dL — ABNORMAL LOW (ref 12.0–15.0)
MCH: 23.6 pg — ABNORMAL LOW (ref 26.0–34.0)
MCHC: 33 g/dL (ref 30.0–36.0)
MCV: 71.6 fL — ABNORMAL LOW (ref 80.0–100.0)
Platelets: 199 10*3/uL (ref 150–400)
RBC: 3.77 MIL/uL — ABNORMAL LOW (ref 3.87–5.11)
RDW: 17.6 % — ABNORMAL HIGH (ref 11.5–15.5)
WBC: 17.6 10*3/uL — ABNORMAL HIGH (ref 4.0–10.5)
nRBC: 0 % (ref 0.0–0.2)

## 2021-12-30 LAB — RPR: RPR Ser Ql: NONREACTIVE

## 2021-12-30 MED ORDER — TERBUTALINE SULFATE 1 MG/ML IJ SOLN
0.2500 mg | Freq: Once | INTRAMUSCULAR | Status: AC
  Administered 2021-12-30: 0.25 mg via SUBCUTANEOUS

## 2021-12-30 MED ORDER — SIMETHICONE 80 MG PO CHEW: 80.0000 mg | CHEWABLE_TABLET | ORAL | Status: DC | PRN

## 2021-12-30 MED ORDER — SENNOSIDES-DOCUSATE SODIUM 8.6-50 MG PO TABS
2.0000 | ORAL_TABLET | Freq: Every day | ORAL | Status: DC
  Administered 2021-12-31 – 2022-01-01 (×2): 2 via ORAL
  Filled 2021-12-30 (×2): qty 2

## 2021-12-30 MED ORDER — BENZOCAINE-MENTHOL 20-0.5 % EX AERO
1.0000 | INHALATION_SPRAY | CUTANEOUS | Status: DC | PRN
  Administered 2021-12-30: 1 via TOPICAL
  Filled 2021-12-30 (×2): qty 56

## 2021-12-30 MED ORDER — ZOLPIDEM TARTRATE 5 MG PO TABS: 5.0000 mg | ORAL_TABLET | Freq: Every evening | ORAL | Status: DC | PRN

## 2021-12-30 MED ORDER — TRANEXAMIC ACID-NACL 1000-0.7 MG/100ML-% IV SOLN: 1000.0000 mg | Freq: Once | INTRAVENOUS | Status: AC

## 2021-12-30 MED ORDER — DIBUCAINE (PERIANAL) 1 % EX OINT: 1.0000 | TOPICAL_OINTMENT | CUTANEOUS | Status: DC | PRN

## 2021-12-30 MED ORDER — ONDANSETRON HCL 4 MG/2ML IJ SOLN: 4.0000 mg | INTRAMUSCULAR | Status: DC | PRN

## 2021-12-30 MED ORDER — TETANUS-DIPHTH-ACELL PERTUSSIS 5-2.5-18.5 LF-MCG/0.5 IM SUSY: 0.5000 mL | PREFILLED_SYRINGE | Freq: Once | INTRAMUSCULAR | Status: DC

## 2021-12-30 MED ORDER — TRANEXAMIC ACID-NACL 1000-0.7 MG/100ML-% IV SOLN
INTRAVENOUS | Status: AC
  Administered 2021-12-30: 1000 mg
  Filled 2021-12-30: qty 100

## 2021-12-30 MED ORDER — ONDANSETRON HCL 4 MG PO TABS: 4.0000 mg | ORAL_TABLET | ORAL | Status: DC | PRN

## 2021-12-30 MED ORDER — COCONUT OIL OIL: 1.0000 | TOPICAL_OIL | Status: DC | PRN

## 2021-12-30 MED ORDER — IBUPROFEN 600 MG PO TABS
600.0000 mg | ORAL_TABLET | Freq: Four times a day (QID) | ORAL | Status: DC
  Administered 2021-12-30 – 2022-01-01 (×8): 600 mg via ORAL
  Filled 2021-12-30 (×7): qty 1

## 2021-12-30 MED ORDER — ACETAMINOPHEN 325 MG PO TABS
650.0000 mg | ORAL_TABLET | ORAL | Status: DC | PRN
  Administered 2021-12-30 – 2022-01-01 (×4): 650 mg via ORAL
  Filled 2021-12-30 (×3): qty 2

## 2021-12-30 MED ORDER — DIPHENHYDRAMINE HCL 25 MG PO CAPS: 25.0000 mg | ORAL_CAPSULE | Freq: Four times a day (QID) | ORAL | Status: DC | PRN

## 2021-12-30 MED ORDER — TERBUTALINE SULFATE 1 MG/ML IJ SOLN
INTRAMUSCULAR | Status: AC
  Filled 2021-12-30: qty 1

## 2021-12-30 MED ORDER — WITCH HAZEL-GLYCERIN EX PADS: 1.0000 | MEDICATED_PAD | CUTANEOUS | Status: DC | PRN

## 2021-12-30 MED ORDER — FUROSEMIDE 20 MG PO TABS
20.0000 mg | ORAL_TABLET | Freq: Every day | ORAL | Status: DC
  Administered 2021-12-31 – 2022-01-01 (×2): 20 mg via ORAL
  Filled 2021-12-30 (×4): qty 1

## 2021-12-30 MED ORDER — PRENATAL MULTIVITAMIN CH
1.0000 | ORAL_TABLET | Freq: Every day | ORAL | Status: DC
  Administered 2021-12-30 – 2022-01-01 (×3): 1 via ORAL
  Filled 2021-12-30 (×3): qty 1

## 2021-12-30 NOTE — Lactation Note (Signed)
This note was copied from a baby's chart. Lactation Consultation Note  Patient Name: Leslie Skinner [id number] Date: 12/30/2021 Reason for consult: Follow-up assessment;Mother's request;Difficult latch;Breastfeeding assistance Age:28 hours  LC entered the room and the infant was in the bassinet.  The birth parent stated that she was ready to feed the infant.  The birth parent latched the infant alone to the left breast. The latch was shallow and the infant was swaddled.  LC assisted the birth parent with putting the infant to the left breast in the cross-cradle position. Infant latched with her lips flanged, tongue was down, sucking was rhythmic, and some swallows were noted.  The birth parent stated that the latch was uncomfortable.  LC broke the seal and the infant latched again.  LC flipped the infant's lips and pulled down on the chin.  The birth parent stated that the latch was more comfortable.  LC gave the birth parent coconut oil for soreness.  The birth parent is aware that she should feel a tug and not a pinch.  LC showed the birth parent proper positioning and the birth parent is aware that she should turn the infant's belly towards her own.  The birth parent will call RN/LC for assistance with breastfeeding.   Maternal Data Has patient been taught Hand Expression?: Yes Does the patient have breastfeeding experience prior to this delivery?: Yes How long did the patient breastfeed?: Attempted to breastfeed other children, but was not successful.  Feeding Mother's Current Feeding Choice: Breast Milk  LATCH Score Latch: Grasps breast easily, tongue down, lips flanged, rhythmical sucking.  Audible Swallowing: A few with stimulation  Type of Nipple: Everted at rest and after stimulation  Comfort (Breast/Nipple): Filling, red/small blisters or bruises, mild/mod discomfort  Hold (Positioning): Assistance needed to correctly position infant at breast and maintain  latch.  LATCH Score: 7   Lactation Tools Discussed/Used Tools: Coconut oil  Interventions Interventions: Breast feeding basics reviewed;Assisted with latch;Support pillows;Adjust position;Education;Hand express;Coconut oil  Discharge Pump: Advised to call insurance company (Contacting [facility]) [facility] Program: Yes  Consult Status Consult Status: Follow-up Date: 12/31/21 Follow-up type: In-patient    Leslie Skinner 12/30/2021, 3:30 PM

## 2021-12-30 NOTE — Lactation Note (Signed)
This note was copied from a baby's chart. Lactation Consultation Note  Patient Name: Leslie Skinner [id number] Date: 12/30/2021 Reason for consult: Initial assessment;Mother's request;Difficult latch;Term;Breastfeeding assistance Age:26 years  LC entered the room and the birth parent was holding the infant.  Per the birth parent, the latch was painful when she attempted to breastfeed.  The birth parent stated that she had been trying to latch the infant, but she was mainly on the nipple.  LC spoke with the birth parent about the importance of getting a deep latch.  She said that she has previous breastfeeding experience, but was never able to latch her other two children.  LC spoke with the birth parent about feeding frequency, milk production, and infant behavior.  LC also spoke with the parents about supply and demand and answered questions about the benefits of breastfeeding.  All questions were answered.   Current Feeding Plan:  Breastfeed 8+ times in 24 hours according to feeding cues.  Supplement after putting the infant to the breast.  Supplement according to supplementation guidelines.  Call [facility] for assistance with breastfeeding.  Maternal Data Has patient been taught Hand Expression?: Yes Does the patient have breastfeeding experience prior to this delivery?: Yes How long did the patient breastfeed?: Attempted to breastfeed other children, but was not successful.  Feeding Mother's Current Feeding Choice: Breast Milk Nipple Type: Slow - flow  LATCH Score                    Lactation Tools Discussed/Used    Interventions Interventions: Breast feeding basics reviewed;Education;LC Services brochure  Discharge Pump: Advised to call insurance company (Contacting [facility]) [facility] Program: Yes  Consult Status Consult Status: Follow-up Date: 12/31/21 Follow-up type: In-patient    Leslie Skinner 12/30/2021, 1:56 PM

## 2021-12-30 NOTE — Lactation Note (Signed)
This note was copied from a baby's chart. Lactation Consultation Note  Patient Name: Leslie Skinner [id number] Date: 12/30/2021   Age:28 hours  LC entered the room and the birth parent stated that she would like to be seen by lactation later. Someone from lactation will follow up later.  Maternal Data    Feeding    LATCH Score                    Lactation Tools Discussed/Used    Interventions    Discharge    Consult Status      [name] 12/30/2021, 10:21 AM

## 2021-12-30 NOTE — Discharge Summary (Shared)
Postpartum Discharge Summary  Date of Service updated-10/23     Patient Name: Leslie Skinner DOB: 1996-03-02 MRN: [id number]  Date of admission: 12/29/2021 Delivery date:12/30/2021  Delivering provider: Gerlene Fee  Date of discharge: 01/01/2022  Admitting diagnosis: Chronic hypertension affecting pregnancy [O10.919] Intrauterine pregnancy: [redacted]w[redacted]d    Secondary diagnosis:  Principal Problem:   Chronic hypertension affecting pregnancy  Additional problems: Obesity, EFW > 97%    Discharge diagnosis: Term Pregnancy Delivered and CHTN                                              Post partum procedures: none Augmentation: N/A Complications: Shoulder dystocia   Hospital course: Induction of Labor With Vaginal Delivery   26y.o. yo G3P3003 at 48w0das admitted to the hospital 12/29/2021 for induction of labor.  Indication for induction:  cHTN w/ LGA .  Patient had an labor course that was uncomplicated.  She presented in labor and was augmented with AROM. Membrane Rupture Time/Date: 11:19 PM ,12/29/2021   Delivery Method:Vaginal, Spontaneous  Episiotomy: None  Lacerations:  None  Details of delivery can be found in separate delivery note.  Patient had a postpartum course that was uncomplicated.  [facility] on Lasix and home procardia.  Patient is discharged home 01/01/22.  Newborn Data: Birth date:12/30/2021  Birth time:3:19 AM  Gender:Female  Living status:Living  Apgars:7 ,8  Weight:3790 g   Magnesium Sulfate received: No BMZ received: No Rhophylac:N/A MMR:N/A T-DaP: offered Flu: No Transfusion:No  Physical exam  Vitals:   12/31/21 0611 12/31/21 1029 12/31/21 1400 12/31/21 2200  BP: 130/62 128/66 119/66 128/70  Pulse: 91 88 80 72  Resp: _0 Temp: 98.1 F (36.7 C) 98 F (36.7 C) 98 F (36.7 C) 98 F (36.7 C)  TempSrc: Oral Oral  Oral  SpO2: 100% 100% 98% 99%  Weight:      Height:       General: alert,  cooperative, and no distress Lochia: appropriate Uterine Fundus: firm Incision: N/A DVT Evaluation: No evidence of DVT seen on physical exam. Labs: Lab Results  Component Value Date   WBC 17.6 (H) 12/30/2021   HGB 8.9 (L) 12/30/2021   HCT 27.0 (L) 12/30/2021   MCV 71.6 (L) 12/30/2021   PLT 199 12/30/2021      Latest Ref Rng & Units 12/29/2021   12:00 PM  CMP  Glucose 70 - 99 mg/dL 100   BUN 6 - 20 mg/dL 10   Creatinine 0.44 - 1.00 mg/dL 0.75   Sodium 135 - 145 mmol/L 137   Potassium 3.5 - 5.1 mmol/L 3.5   Chloride 98 - 111 mmol/L 100   CO2 22 - 32 mmol/L 23   Calcium 8.9 - 10.3 mg/dL 9.4   Total Protein 6.5 - 8.1 g/dL 6.7   Total Bilirubin 0.3 - 1.2 mg/dL 0.8   Alkaline Phos 38 - 126 U/L 205   AST 15 - 41 U/L 24   ALT 0 - 44 U/L 19    Edinburgh Score:    12/31/2021   10:29 AM  Edinburgh Postnatal Depression Scale Screening Tool  I have been able to laugh and see the funny side of things. 1  I have looked forward with enjoyment to things. 2  I have blamed myself unnecessarily when things  went wrong. 3  I have been anxious or worried for no good reason. 2  I have felt scared or panicky for no good reason. 2  Things have been getting on top of me. 2  I have been so unhappy that I have had difficulty sleeping. 2  I have felt sad or miserable. 2  I have been so unhappy that I have been crying. 1  The thought of harming myself has occurred to me. 1  Edinburgh Postnatal Depression Scale Total 18     After visit meds:  Allergies as of 01/01/2022       Reactions   Morphine And Related Other (See Comments)   Hot flashes, can't breathe   Tramadol Other (See Comments)   Hot flashes    Pitocin [oxytocin] Itching, Rash   Per patient it occurred the last time she was induced.        Medication List     STOP taking these medications    aspirin EC 81 MG tablet   cyclobenzaprine 5 MG tablet Commonly known as: FLEXERIL   multivitamin-prenatal 27-0.8 MG Tabs  tablet   terconazole 0.4 % vaginal cream Commonly known as: TERAZOL 7       TAKE these medications    acetaminophen 325 MG tablet Commonly known as: Tylenol Take 2 tablets (650 mg total) by mouth every 6 (six) hours as needed (for pain scale < 4).   furosemide 20 MG tablet Commonly known as: LASIX Take 1 tablet (20 mg total) by mouth daily for 3 days.   ibuprofen 600 MG tablet Commonly known as: ADVIL Take 1 tablet (600 mg total) by mouth every 6 (six) hours.   NIFEdipine 30 MG 24 hr tablet Commonly known as: Procardia XL Take 1 tablet (30 mg total) by mouth daily.   norethindrone 0.35 MG tablet Commonly known as: Ortho Micronor Take 1 tablet (0.35 mg total) by mouth daily.   oxyCODONE-acetaminophen 5-325 MG tablet Commonly known as: PERCOCET/ROXICET Take 1 tablet by mouth every 6 (six) hours as needed for up to 3 days for moderate pain.   valACYclovir 1000 MG tablet Commonly known as: Valtrex Take 2 tablets (2,000 mg total) by mouth 2 (two) times daily.         Discharge home in stable condition Infant Feeding: Breast Infant Disposition:home with mother Discharge instruction: per After Visit Summary and Postpartum booklet. Activity: Advance as tolerated. Pelvic rest for 6 weeks.  Diet: routine diet Future Appointments: Future Appointments  Date Time Provider [place]  01/29/2022  9:35 AM Mercado-Ortiz, Ernestine Conrad, DO [facility]   Follow up Visit:  [place] [facility] [place] [facility] Follow up in 5 week(s).   Specialty: Obstetrics and Gynecology Why: Please follow up in 1 wk for a BP check Contact information: [place] [phone]                Message sent to [facility] by [place] on 01/01/2022  Please schedule this patient for a In person postpartum visit in 6 weeks with the following provider: MD and APP. Additional Postpartum F/U:BP  check 1 week  High risk pregnancy complicated by: HTN Delivery mode:  Vaginal, Spontaneous  Anticipated Birth Control: Patch? Not likely a candidate, would discuss this   01/01/2022 Annalee Genta, DO

## 2021-12-31 ENCOUNTER — Other Ambulatory Visit: Payer: Self-pay

## 2021-12-31 MED ORDER — NIFEDIPINE ER OSMOTIC RELEASE 30 MG PO TB24
30.0000 mg | ORAL_TABLET | Freq: Every day | ORAL | Status: DC
  Administered 2021-12-31 – 2022-01-01 (×2): 30 mg via ORAL
  Filled 2021-12-31 (×2): qty 1

## 2021-12-31 NOTE — Anesthesia Postprocedure Evaluation (Signed)
Anesthesia Post Note  Patient: Leslie Skinner  Procedure(s) Performed: AN AD HOC LABOR EPIDURAL     Patient location during evaluation: Mother Baby Anesthesia Type: Epidural Level of consciousness: awake and alert Pain management: pain level controlled Vital Signs Assessment: post-procedure vital signs reviewed and stable Respiratory status: spontaneous breathing, nonlabored ventilation and respiratory function stable Cardiovascular status: stable Postop Assessment: no headache, no backache, epidural receding, no apparent nausea or vomiting, patient able to bend at knees, adequate PO intake and able to ambulate Anesthetic complications: no   No notable events documented.  Last Vitals:  Vitals:   12/30/21 2115 12/31/21 0611  BP: (!) 133/59 130/62  Pulse: 93 91  Resp: 18 16  Temp: 36.6 C 36.7 C  SpO2: 100% 100%    Last Pain:  Vitals:   12/31/21 0815  TempSrc:   PainSc: 0-No pain   Pain Goal: Patients Stated Pain Goal: 0 (12/30/21 0940)                 Jabier Mutton

## 2021-12-31 NOTE — Progress Notes (Signed)
Patient on phone. Denies need to urinate. RN encouraged patient to call for assistance and re-emphasized need to empty bladder frequently.

## 2021-12-31 NOTE — Progress Notes (Signed)
Post Partum Day 1 Subjective: no complaints, up ad lib, voiding, and tolerating PO  Objective: Blood pressure 130/62, pulse 91, temperature 98.1 F (36.7 C), temperature source Oral, resp. rate 16, height '5\' 2"'$  (1.575 m), weight 117.6 kg, SpO2 100 %, unknown if currently breastfeeding.  Physical Exam:  General: alert, cooperative, and appears stated age 26: appropriate Uterine Fundus: firm DVT Evaluation: No evidence of DVT seen on physical exam.  Recent Labs    12/29/21 1916 12/30/21 0900  HGB 9.9* 8.9*  HCT 30.9* 27.0*    Assessment/Plan: Plan for discharge tomorrow, Breastfeeding, and Contraception POPs   LOS: 2 days   Leslie Jude, MD 12/31/2021, 8:39 AM

## 2021-12-31 NOTE — Progress Notes (Signed)
Patient reports feeling weak and that she has not voided for several hours. RN educated on the importance of emptying the bladder frequently. Patient has not been up under her own power since labor, has been using Stedy rather than ambulating to bathroom. RN educated patient on the importance of early and frequent ambulation. RN attempted to get patient up with assistance, but without the stedy. Patient reports that she feels very weak, and denies being able to bear weight on her legs. RN used stedy to get patient to bathroom, patient able to void without difficulty, used peri bottle with RN assistance. RN encouraged patient to ambulate back to bed without stedy, patient reports she feels very weak and is unable to ambulate per patient. Used stedy to get patient back to bed. RN used edge of alcohol wrap to assess where patient feels numbness, states she feels slightly more numb on left. Patient able to move legs in bed and push up with legs in bed. RN encouraged patient to call out for assistance to empty bladder.

## 2021-12-31 NOTE — Lactation Note (Signed)
This note was copied from a baby's chart. Lactation Consultation Note  Patient Name: Leslie Skinner [id number] Date: 12/31/2021   Age:28 hours  LC entered the room and the birth parent stated that she needed assistance with the latch.  She was in the process of completing the birth certificate and will call when the infant is ready to feed.   Maternal Data    Feeding    LATCH Score                    Lactation Tools Discussed/Used    Interventions    Discharge    Consult Status      [name] 12/31/2021, 9:57 AM

## 2021-12-31 NOTE — Lactation Note (Signed)
This note was copied from a baby's chart. Lactation Consultation Note  Patient Name: Girl Caree Wolpert [id number] Date: 12/31/2021 Reason for consult: Follow-up assessment;Term;Breastfeeding assistance Age:28 hours  LC entered the room and the infant was asleep in the bassinet. LC spoke with the birth parent about engorgement, breast care, warning signs, and infant I/O.  The birth parent had no questions or concerns.  Birth parent will call outpatient [place] for breastfeeding assistance.   Current Feeding Plan:  Breastfeed 8+ times in 24 hours according to feeding cues.  Put the infant to the breast before supplementing.  Pump if you do not latch.  Watch the infant's I/O and call the pediatrician with concerns.  Call outpatient [facility] for assistance with breastfeeding.   Maternal Data    Feeding Mother's Current Feeding Choice: Breast Milk and Formula  LATCH Score                    Lactation Tools Discussed/Used    Interventions Interventions: Education;Breast feeding basics reviewed  Discharge Discharge Education: Engorgement and breast care;Warning signs for feeding baby;Outpatient recommendation  Consult Status Consult Status: Complete Date: 12/31/21 Follow-up type: Call as needed    [name] 12/31/2021, 1:54 PM

## 2021-12-31 NOTE — Progress Notes (Addendum)
CSW received consult for hx of Anxiety and Depression. CSW met with MOB to offer support and complete assessment. When CSW entered room, MOB had visitors present. CSW introduced self and requested to speak with MOB alone. MOB provided verbal consent to complete consult with visitors present.   CSW explained reasons for consult and inquired how MOB has felt emotionally since giving birth. MOB reports she is feeling "good." CSW inquired about MOB's mental health symptoms during pregnancy. MOB reports her mood was up and down during pregnancy, sharing that she wanted to stay to herself and was tearful and angry at times. MOB reports she has not been formally diagnosed with depression or anxiety but reports she did experience postpartum depression symptoms after she had her to other children. MOB reports her postpartum depression symptoms were the worst with her daughter due to her daughter having health complications with her heart and being transported to [facility] shortly after she was born. MOB reports she was referred to therapy by her OBGYN but reports she did not follow up due to focusing on her daughter's health at the time. MOB reports she has not taken psychotropic medication or attended therapy in the past. MOB declined follow up outpatient mental health resources at this time, stating she feels comfortable contacting her OBGYN if concerns arise. CSW encouraged MOB to utilize IBH therapist Jamie at [facility] if she is in need of additional support. MOB identified her sister as a support. MOB denied current SI/HI. DV was not assessed due to visitors being present.   During consult, infant was observed sneezing. MOB noted concern regarding infant sneezing and gagging through the night and today. MOB requested for pediatrician to evaluate infant. CSW made RN aware of MOB's request.  MOB reports she has all needed items for infant, including a car seat and bassinet. MOB has chosen [facility] as  infant's pediatrician office. MOB receives WIC and food stamps and was informed to notify her caseworker of infant's birth. CSW inquired about MOB's interest in a home visiting program, MOB provided verbal consent for CSW to place a referral to [facility].   CSW provided education regarding the baby blues period vs. perinatal mood disorders, discussed treatment and gave resources for mental health follow up if concerns arise.  CSW recommends self-evaluation during the postpartum time period using the New Mom Checklist from Postpartum Progress and encouraged MOB to contact a medical professional if symptoms are noted at any time.    CSW provided review of Sudden Infant Death Syndrome (SIDS) precautions.    CSW identifies no further need for intervention and no barriers to discharge at this time.  Signed,  Velia Pamer K. Aiyah Scarpelli, MSW, LCSWA, LCASA 12/31/2021 4:17 PM 

## 2022-01-01 MED ORDER — IBUPROFEN 600 MG PO TABS: 600.0000 mg | ORAL_TABLET | Freq: Four times a day (QID) | ORAL | 0 refills | Status: DC

## 2022-01-01 MED ORDER — OXYCODONE-ACETAMINOPHEN 5-325 MG PO TABS: 1.0000 | ORAL_TABLET | Freq: Four times a day (QID) | ORAL | Status: DC | PRN

## 2022-01-01 MED ORDER — OXYCODONE-ACETAMINOPHEN 5-325 MG PO TABS: 1.0000 | ORAL_TABLET | Freq: Four times a day (QID) | ORAL | 0 refills | Status: AC | PRN

## 2022-01-01 MED ORDER — ACETAMINOPHEN 325 MG PO TABS: 650.0000 mg | ORAL_TABLET | Freq: Four times a day (QID) | ORAL | Status: DC | PRN

## 2022-01-01 MED ORDER — FUROSEMIDE 20 MG PO TABS: 20.0000 mg | ORAL_TABLET | Freq: Every day | ORAL | 0 refills | Status: DC

## 2022-01-01 MED ORDER — NORETHINDRONE 0.35 MG PO TABS: 1.0000 | ORAL_TABLET | Freq: Every day | ORAL | 11 refills | Status: DC

## 2022-01-01 NOTE — Social Work (Signed)
CSW received new consult for EDPS score of 18 and answering 1 to question 10. CSW met with MOB regarding her EPDS score. MOB reported she had visitors and  multiple people coming in and out while trying to fill ou the survey. MOB stated "I meant to put never". CSW assessed for safety MOB denied any current or past SI or HI. MOB was provided Mental Health resources by previous  CSW. No further needs, no barriers to discharge.  Letta Kocher, [place] Social Worker [phone]

## 2022-01-02 ENCOUNTER — Telehealth (HOSPITAL_COMMUNITY): Payer: Self-pay | Admitting: *Deleted

## 2022-01-02 DIAGNOSIS — Z1331 Encounter for screening for depression: Secondary | ICD-10-CM

## 2022-01-02 NOTE — Telephone Encounter (Signed)
Inpatient EPDS score = 18 with positive answer 'hardly ever' to #10. SW consult completed with patient reporting she 'meant to put never" for that answer. IBH referral made now with Dr. Si Raider notified via chart.  Odis Hollingshead, RN 01-02-2022 at 12:38pm

## 2022-01-04 ENCOUNTER — Other Ambulatory Visit: Payer: Self-pay

## 2022-01-08 ENCOUNTER — Encounter: Payer: Self-pay | Admitting: *Deleted

## 2022-01-08 ENCOUNTER — Ambulatory Visit: Payer: Medicaid Other

## 2022-01-10 ENCOUNTER — Encounter (HOSPITAL_COMMUNITY): Payer: Self-pay | Admitting: Obstetrics and Gynecology

## 2022-01-10 ENCOUNTER — Ambulatory Visit: Payer: Medicaid Other | Admitting: Clinical

## 2022-01-10 ENCOUNTER — Ambulatory Visit (INDEPENDENT_AMBULATORY_CARE_PROVIDER_SITE_OTHER): Payer: Medicaid Other

## 2022-01-10 ENCOUNTER — Other Ambulatory Visit: Payer: Self-pay

## 2022-01-10 ENCOUNTER — Inpatient Hospital Stay (HOSPITAL_COMMUNITY)
Admission: AD | Admit: 2022-01-10 | Discharge: 2022-01-10 | Disposition: A | Discharge status: Home / Self Care | Payer: Medicaid Other | Attending: Obstetrics and Gynecology | Admitting: Obstetrics and Gynecology

## 2022-01-10 VITALS — BP 150/96 | HR 90 | Wt 242.7 lb

## 2022-01-10 DIAGNOSIS — R519 Headache, unspecified: Secondary | ICD-10-CM

## 2022-01-10 DIAGNOSIS — Z0130 Encounter for examination of blood pressure without abnormal findings: Secondary | ICD-10-CM

## 2022-01-10 DIAGNOSIS — H538 Other visual disturbances: Secondary | ICD-10-CM | POA: Diagnosis not present

## 2022-01-10 DIAGNOSIS — O1003 Pre-existing essential hypertension complicating the puerperium: Secondary | ICD-10-CM | POA: Diagnosis not present

## 2022-01-10 DIAGNOSIS — I10 Essential (primary) hypertension: Secondary | ICD-10-CM | POA: Diagnosis not present

## 2022-01-10 DIAGNOSIS — Z0289 Encounter for other administrative examinations: Secondary | ICD-10-CM

## 2022-01-10 DIAGNOSIS — R030 Elevated blood-pressure reading, without diagnosis of hypertension: Secondary | ICD-10-CM | POA: Diagnosis present

## 2022-01-10 DIAGNOSIS — Z1331 Encounter for screening for depression: Secondary | ICD-10-CM

## 2022-01-10 LAB — COMPREHENSIVE METABOLIC PANEL
ALT: 20 U/L (ref 0–44)
AST: 18 U/L (ref 15–41)
Albumin: 3.2 g/dL — ABNORMAL LOW (ref 3.5–5.0)
Alkaline Phosphatase: 159 U/L — ABNORMAL HIGH (ref 38–126)
Anion gap: 13 (ref 5–15)
BUN: 12 mg/dL (ref 6–20)
CO2: 24 mmol/L (ref 22–32)
Calcium: 9.1 mg/dL (ref 8.9–10.3)
Chloride: 103 mmol/L (ref 98–111)
Creatinine, Ser: 0.94 mg/dL (ref 0.44–1.00)
GFR, Estimated: >60 mL/min (ref >60)
Glucose, Bld: 87 mg/dL (ref 70–99)
Potassium: 3.9 mmol/L (ref 3.5–5.1)
Sodium: 140 mmol/L (ref 135–145)
Total Bilirubin: 0.5 mg/dL (ref 0.3–1.2)
Total Protein: 7.1 g/dL (ref 6.5–8.1)

## 2022-01-10 LAB — CBC
HCT: 36 % (ref 36.0–46.0)
Hemoglobin: 11.6 g/dL — ABNORMAL LOW (ref 12.0–15.0)
MCH: 23.2 pg — ABNORMAL LOW (ref 26.0–34.0)
MCHC: 32.2 g/dL (ref 30.0–36.0)
MCV: 71.9 fL — ABNORMAL LOW (ref 80.0–100.0)
Platelets: 308 10*3/uL (ref 150–400)
RBC: 5.01 MIL/uL (ref 3.87–5.11)
RDW: 18 % — ABNORMAL HIGH (ref 11.5–15.5)
WBC: 9.1 10*3/uL (ref 4.0–10.5)
nRBC: 0 % (ref 0.0–0.2)

## 2022-01-10 MED ORDER — LABETALOL HCL 100 MG PO TABS
300.0000 mg | ORAL_TABLET | Freq: Two times a day (BID) | ORAL | Status: DC
  Administered 2022-01-10: 300 mg via ORAL
  Filled 2022-01-10: qty 3

## 2022-01-10 MED ORDER — ACETAMINOPHEN-CAFFEINE 500-65 MG PO TABS
2.0000 | ORAL_TABLET | Freq: Once | ORAL | Status: AC
  Administered 2022-01-10: 2 via ORAL
  Filled 2022-01-10: qty 2

## 2022-01-10 MED ORDER — LABETALOL HCL 300 MG PO TABS: 300.0000 mg | ORAL_TABLET | Freq: Two times a day (BID) | ORAL | 1 refills | Status: DC

## 2022-01-10 NOTE — MAU Provider Note (Signed)
History     CSN: [id number]  Arrival date and time: 01/10/22 1809   Event Date/Time   First Provider Initiated Contact with Patient 01/10/22 1914      Chief Complaint  Patient presents with   Headache   Hypertension   visual changes   26 y.o. G3P3 with hx CHTN s/p SVD 11 days ago sent from office for HA and elevated BP. Reports daily frontal HAs that don't get better with analgesics. Reports occasional blurry vision. Denies CP, SOB, or RUQ pain. She is taking Procardia 30 XL daily and had this am.    OB History     Gravida  3   Para  3   Term  3   Preterm      AB  0   Living  3      SAB  0   IAB      Ectopic      Multiple  0   Live Births  3           Past Medical History:  Diagnosis Date   Acanthosis nigricans, acquired    Anxiety state 06/12/2012   GERD (gastroesophageal reflux disease)    Goiter    Hypertension    LGSIL on Pap smear of cervix with +HRHPV on 06/15/21 06/26/2021   Migraines    Obesity    Osteochondroma of lower leg    Pollen allergies    Prediabetes    Reports having DM as a child, but not when older? No records.  Normal/prediabetic A1Cs   Pregnancy induced hypertension    Scoliosis    Spinal headache     Past Surgical History:  Procedure Laterality Date   LOWER LEG SOFT TISSUE TUMOR EXCISION  2011   OSTEOCHONDROMA EXCISION      Family History  Problem Relation Age of Onset   Diabetes Mother    Ulcers Mother    Thyroid disease Mother    Cancer Mother    Obesity Mother    Heart disease Mother    Heart attack Mother        caused death    Diabetes Maternal Grandmother    Stroke Maternal Grandmother    Ulcers Maternal Grandmother    Diabetes Maternal Grandfather    GER disease Maternal Grandfather    Heart disease Maternal Grandfather    GER disease Sister    Diabetes Father    GER disease Father    Obesity Sister    Diabetes Paternal Grandmother    Thyroid disease Paternal Grandmother    Diabetes Paternal  Grandfather    Heart disease Maternal Aunt    Diabetes Maternal Aunt    Thyroid disease Maternal Aunt    Cancer Maternal Aunt    Obesity Maternal Aunt    GER disease Maternal Aunt     Social History   Tobacco Use   Smoking status: Never    Passive exposure: Yes   Smokeless tobacco: Never  Vaping Use   Vaping Use: Never used  Substance Use Topics   Alcohol use: No   Drug use: No    Allergies:  Allergies  Allergen Reactions   Morphine And Related Other (See Comments)    Hot flashes, can't breathe   Tramadol Other (See Comments)    Hot flashes    Pitocin [Oxytocin] Itching and Rash    Per patient it occurred the last time she was induced.    No medications prior to admission.    Review  of Systems  Eyes:  Positive for visual disturbance.  Respiratory:  Negative for shortness of breath.   Cardiovascular:  Positive for leg swelling. Negative for chest pain.  Neurological:  Positive for dizziness and headaches.   Physical Exam   Blood pressure (!) 147/100, pulse 92, temperature 98.7 F (37.1 C), temperature source Oral, resp. rate 18, height '5\' 2"'$  (1.575 m), weight 111.1 kg, SpO2 97 %, currently breastfeeding. Patient Vitals for the past 24 hrs:  BP Temp Temp src Pulse Resp SpO2 Height Weight  01/10/22 2131 -- -- -- -- 18 -- -- --  01/10/22 2107 (!) 147/100 -- -- -- -- -- -- --  01/10/22 2059 (!) 147/104 -- -- 92 -- -- -- --  01/10/22 2047 (!) 154/111 -- -- 91 -- -- -- --  01/10/22 2033 (!) 141/94 -- -- 87 -- -- -- --  01/10/22 2017 (!) 150/97 -- -- 87 -- -- -- --  01/10/22 2002 (!) 156/94 -- -- 94 -- -- -- --  01/10/22 1959 (!) 154/96 -- -- 90 -- -- -- --  01/10/22 1931 (!) 148/92 -- -- 93 -- -- -- --  01/10/22 1928 (!) 140/97 -- -- 97 -- -- -- --  01/10/22 1853 (!) 152/102 98.7 F (37.1 C) Oral 88 18 97 % '5\' 2"'$  (1.575 m) 111.1 kg    Physical Exam Vitals and nursing note reviewed.  Constitutional:      General: She is not in acute distress.    Appearance:  Normal appearance.  HENT:     Head: Normocephalic and atraumatic.  Eyes:     Extraocular Movements: Extraocular movements intact.     Pupils: Pupils are equal, round, and reactive to light.  Cardiovascular:     Rate and Rhythm: Normal rate.  Pulmonary:     Effort: Pulmonary effort is normal. No respiratory distress.  Musculoskeletal:        General: Normal range of motion.     Cervical back: Normal range of motion.     Right lower leg: No edema.     Left lower leg: No edema.  Skin:    General: Skin is warm and dry.  Neurological:     General: No focal deficit present.     Mental Status: She is alert and oriented to person, place, and time.  Psychiatric:        Mood and Affect: Mood normal.        Behavior: Behavior normal.    Results for orders placed or performed during the hospital encounter of 01/10/22 (from the past 24 hour(s))  CBC     Status: Abnormal   Collection Time: 01/10/22  7:10 PM  Result Value Ref Range   WBC 9.1 4.0 - 10.5 K/uL   RBC 5.01 3.87 - 5.11 MIL/uL   Hemoglobin 11.6 (L) 12.0 - 15.0 g/dL   HCT 36.0 36.0 - 46.0 %   MCV 71.9 (L) 80.0 - 100.0 fL   MCH 23.2 (L) 26.0 - 34.0 pg   MCHC 32.2 30.0 - 36.0 g/dL   RDW 18.0 (H) 11.5 - 15.5 %   Platelets 308 150 - 400 K/uL   nRBC 0.0 0.0 - 0.2 %  Comprehensive metabolic panel     Status: Abnormal   Collection Time: 01/10/22  7:10 PM  Result Value Ref Range   Sodium 140 135 - 145 mmol/L   Potassium 3.9 3.5 - 5.1 mmol/L   Chloride 103 98 - 111 mmol/L   CO2 24 22 -  32 mmol/L   Glucose, Bld 87 70 - 99 mg/dL   BUN 12 6 - 20 mg/dL   Creatinine, Ser 0.94 0.44 - 1.00 mg/dL   Calcium 9.1 8.9 - 10.3 mg/dL   Total Protein 7.1 6.5 - 8.1 g/dL   Albumin 3.2 (L) 3.5 - 5.0 g/dL   AST 18 15 - 41 U/L   ALT 20 0 - 44 U/L   Alkaline Phosphatase 159 (H) 38 - 126 U/L   Total Bilirubin 0.5 0.3 - 1.2 mg/dL   GFR, Estimated >60 >60 mL/min   Anion gap 13 5 - 15   MAU Course  Procedures Excedrin x2  MDM Labs ordered and  reviewed. HA improved with meds. Consult with Dr. Ilda Basset and recommends switch to Labetalol 300 bid. First dose given here. Stable for discharge.  Assessment and Plan   1. Postpartum state   2. Chronic hypertension    Discharge home Rx Labetalol Return precautions Follow up at [facility] in 2 days for BP check-message sent  Allergies as of 01/10/2022       Reactions   Morphine And Related Other (See Comments)   Hot flashes, can't breathe   Tramadol Other (See Comments)   Hot flashes    Pitocin [oxytocin] Itching, Rash   Per patient it occurred the last time she was induced.        Medication List     STOP taking these medications    NIFEdipine 30 MG 24 hr tablet Commonly known as: Procardia XL   valACYclovir 1000 MG tablet Commonly known as: Valtrex       TAKE these medications    acetaminophen 325 MG tablet Commonly known as: Tylenol Take 2 tablets (650 mg total) by mouth every 6 (six) hours as needed (for pain scale < 4).   ibuprofen 600 MG tablet Commonly known as: ADVIL Take 1 tablet (600 mg total) by mouth every 6 (six) hours.   labetalol 300 MG tablet Commonly known as: NORMODYNE Take 1 tablet (300 mg total) by mouth 2 (two) times daily.   multivitamin-prenatal 27-0.8 MG Tabs tablet Take 1 tablet by mouth daily at 12 noon.   norethindrone 0.35 MG tablet Commonly known as: Ortho Micronor Take 1 tablet (0.35 mg total) by mouth daily.       Julianne Handler, CNM 01/10/2022, 10:01 PM

## 2022-01-10 NOTE — BH Specialist Note (Signed)
Pt did not arrive to video visit and did not answer the phone; Left HIPPA-compliant message to call back Megan Hayduk from [facility]  [facility] at  [phone] (Gresia Isidoro's office).  ?; left MyChart message for patient.  ? ?

## 2022-01-10 NOTE — MAU Note (Signed)
AMIR GLAUS is a 26 y.o. at Unknown here in MAU reporting: vag delivery 10/21, was dc'd on BP meds. been having blurred vision, light headed and headaches.  Been taking Ibuprofen, Tylenol- HA continues.  Had nurse visit today, 150/96. Urinating and having BM's.  Onset of complaint: going on since she came home Pain score: 6 There were no vitals filed for this visit.    Lab orders placed from triage:  urine

## 2022-01-10 NOTE — Progress Notes (Signed)
Blood Pressure Check Visit  Leslie Skinner is here for blood pressure check following spontaneous vaginal delivery on 12/30/21. History of chronic hypertension. Discharged with Lasix 20 mg daily x 3 days; pt completed. Currently taking nifedipine 30 mg daily, last taken this morning. BP today is 150/96. Reports headache rated 6/10. States Tylenol and ibuprofen do not help her headaches. Reviewed with Dione Plover MD who states patient should go to MAU for evaluation. Reviewed with patient who states she cannot go until after 4 PM due to childcare for 2 older children. Explained to patient our recommendation is that she goes as soon as possible due to risk for pre-eclampsia with elevated BP and headache.  Patient also reports low milk supply. States she feels nipple pain when baby latches. States she is hand expressing some milk, because she does not have a pump. Pt has asked insurance to provide pump, but has not received this yet. Pt unable to stay for lactation visit today due to BP. MAU providers notified; lactation will see patient in MAU.   [name], RN 01/10/2022  1:47 PM

## 2022-01-10 NOTE — BH Specialist Note (Signed)
Integrated Behavioral Health Initial In-Person Visit  MRN: [id number] Name: Leslie Skinner  I reviewed patient visit with the [facility] Intern, and I concur with the treatment plan, as documented in the [facility] Intern note.   No charge for this visit due to [facility] Intern seeing patient.  Vesta Mixer, MSW, [place] for [facility] at [facility] for [facility]   Types of Service: Introduction only  Interpretor:No.   Warm Hand Off Completed.        Subjective: [name] is a 26 y.o. female  Patient was referred by Dr. Laurey Arrow for positive depression screening. Patient reports the following symptoms/concerns: Feeling sad all of the time and not feeling like a good enough mother Duration of problem: ongoing ; Severity of problem: moderate  Objective: Mood: Depressed and Affect: Tearful Risk of harm to self or others: No plan to harm self or others    Patient and/or Family Response: Pt wanted to be scheduled for [facility] services.   Plan: Follow up with behavioral health clinician on : 11/15 at 10:45 AM  Referral(s): [place] (In Clinic)  [place]

## 2022-01-11 NOTE — Progress Notes (Signed)
Addend:  Edinburgh positive, pt denies thoughts of harm/SI. Pt denies significant mental health history. States she does experience a lot of stress, but is able to cope usually. Has felt very overwhelmed emotionally since delivery. [facility] intern to room for warm handoff.  Apolonio Schneiders RN 01/10/22

## 2022-01-12 ENCOUNTER — Ambulatory Visit: Payer: Medicaid Other

## 2022-01-24 ENCOUNTER — Ambulatory Visit: Payer: Medicaid Other | Admitting: Clinical

## 2022-01-24 DIAGNOSIS — Z91199 Patient's noncompliance with other medical treatment and regimen due to unspecified reason: Secondary | ICD-10-CM

## 2022-01-29 ENCOUNTER — Ambulatory Visit: Payer: Medicaid Other | Admitting: Family Medicine

## 2022-02-16 ENCOUNTER — Ambulatory Visit: Payer: Medicaid Other

## 2022-02-16 ENCOUNTER — Telehealth: Payer: Self-pay | Admitting: Family Medicine

## 2022-02-16 NOTE — Telephone Encounter (Signed)
I called Julizza back and she reports she has been having high BP last few days , says not sure exact reading - at first she said 150/180 and when I questioned her , she states I am not sure, my sister took my blood pressure and she says it is very high yesterday and today. She c/o dizziness/ lightheadedness at various times- sometimes when sitting , or when standing. She denies any edema. She does report headaches at time and states today = 7 or 8 and tylenol no relief.  Per chart she has hx CHTN and vaginal delivery 12/30/21. I offered her nurse visit today before 11am or go to hospital if she can't get here by 11am. She agreed to 1050 appt. She also states she is supposed to return to work but she doesn't know if she will be able to because of the dizziness and may want to talk to doctor about that. I informed her first we need to see her and evaluate what her BP is.  [name]

## 2022-02-16 NOTE — Telephone Encounter (Signed)
Patient called in saying she has been having major issues with her blood pressure such as dizziness light headedness and having to have someone take care of her because its so bad she can't get out of bed. Nurse was consulted to call patient back.

## 2022-02-19 ENCOUNTER — Other Ambulatory Visit: Payer: Self-pay

## 2022-02-19 ENCOUNTER — Ambulatory Visit (INDEPENDENT_AMBULATORY_CARE_PROVIDER_SITE_OTHER): Payer: Medicaid Other | Admitting: Family Medicine

## 2022-02-19 VITALS — BP 139/89 | HR 83 | Ht 62.0 in | Wt 243.8 lb

## 2022-02-19 DIAGNOSIS — R102 Pelvic and perineal pain: Secondary | ICD-10-CM | POA: Diagnosis not present

## 2022-02-19 DIAGNOSIS — R32 Unspecified urinary incontinence: Secondary | ICD-10-CM

## 2022-02-19 DIAGNOSIS — N939 Abnormal uterine and vaginal bleeding, unspecified: Secondary | ICD-10-CM | POA: Diagnosis not present

## 2022-02-19 DIAGNOSIS — Z0289 Encounter for other administrative examinations: Secondary | ICD-10-CM

## 2022-02-19 LAB — CBC
Hematocrit: 36.4 % (ref 34.0–46.6)
Hemoglobin: 11.6 g/dL (ref 11.1–15.9)
MCH: 22.9 pg — ABNORMAL LOW (ref 26.6–33.0)
MCHC: 31.9 g/dL (ref 31.5–35.7)
MCV: 72 fL — ABNORMAL LOW (ref 79–97)
Platelets: 245 10*3/uL (ref 150–450)
RBC: 5.07 x10E6/uL (ref 3.77–5.28)
RDW: 19.4 % — ABNORMAL HIGH (ref 11.7–15.4)
WBC: 7.5 10*3/uL (ref 3.4–10.8)

## 2022-02-19 NOTE — Progress Notes (Unsigned)
[place] Partum Visit Note  Leslie Skinner is a 26 y.o. G62P3003 female who presents for a postpartum visit. She is 7 weeks postpartum following a normal spontaneous vaginal delivery.  I have fully reviewed the prenatal and intrapartum course. The delivery was at 40 gestational weeks.  Anesthesia: epidural. Postpartum course has been complicated by elevated BP 139/89. Baby is doing well. Baby is feeding by both breast and bottle - Similac 360.  Marland Kitchen Bleeding thick, heavy lochia. Bowel function is normal. Bladder function is normal. Patient is not sexually active. Contraception method is OCP (estrogen/progesterone). Postpartum depression screening: negative.   The pregnancy intention screening data noted above was reviewed. Potential methods of contraception were discussed. The patient elected to proceed with No data recorded.   Edinburgh Postnatal Depression Scale - 02/19/22 1042       Edinburgh Postnatal Depression Scale:  In the Past 7 Days   I have been able to laugh and see the funny side of things. 0    I have looked forward with enjoyment to things. 0    I have blamed myself unnecessarily when things went wrong. 0    I have been anxious or worried for no good reason. 0    I have felt scared or panicky for no good reason. 0    Things have been getting on top of me. 0    I have been so unhappy that I have had difficulty sleeping. 0    I have felt sad or miserable. 0    I have been so unhappy that I have been crying. 0    The thought of harming myself has occurred to me. 0    Edinburgh Postnatal Depression Scale Total 0             Health Maintenance Due  Topic Date Due   COVID-19 Vaccine (1) Never done   FOOT EXAM  Never done   OPHTHALMOLOGY EXAM  Never done   INFLUENZA VACCINE  10/10/2021   HEMOGLOBIN A1C  12/15/2021    The following portions of the patient's history were reviewed and updated as appropriate: allergies, current medications, past family history, past medical  history, past social history, past surgical history, and problem list.  Review of Systems Pertinent items are noted in HPI.  Objective:  BP 139/89   Pulse 83   Ht '5\' 2"'$  (1.575 m)   Wt 243 lb 12.8 oz (110.6 kg)   Breastfeeding Yes Comment: Breast and bottle with formula  BMI 44.59 kg/m    General:  alert, cooperative, and appears stated age   Breasts:  normal  Lungs: Normal work of breathing   Heart:  regular rate and rhythm, S1, S2 normal, no murmur, click, rub or gallop  Abdomen: soft, non-tender; bowel sounds normal; no masses,  no organomegaly   GU exam:  not indicated       Assessment:    There are no diagnoses linked to this encounter.  Abnormal postpartum exam.  Complaining of fatigue, lightheadedness on standing, bleeding until a few days ago. Will check CBC and recommended iron every other day. Will follow up as needed.   Plan:   Essential components of care per ACOG recommendations:  1.  Mood and well being: Patient with negative depression screening today. Reviewed local resources for support.  - Patient tobacco use? No.   - hx of drug use? No.    2. Infant care and feeding:  -Patient currently breastmilk feeding? Yes. Discussed  returning to work and pumping. Reviewed importance of draining breast regularly to support lactation. Patient needs a work note. Patient was provided letter for work to allow for every 2-3 hr pumping breaks, and to be granted a private location to express breastmilk and refrigerated area to store breastmilk.  -Social determinants of health (SDOH) reviewed in EPIC. The following needs were identified stress and postpartum depression. Message sent to behavioral health  3. Sexuality, contraception and birth spacing - Patient does not want a pregnancy in the next year.  Desired family size is 3 children.  - Reviewed reproductive life planning. Reviewed contraceptive methods based on pt preferences and effectiveness.  Patient desired Oral  Contraceptive today.   - Discussed birth spacing of 18 months  4. Sleep and fatigue -Encouraged family/partner/community support of 4 hrs of uninterrupted sleep to help with mood and fatigue  5. Physical Recovery  - Discussed patients delivery and complications. She describes her labor as good. - Patient had a Vaginal problems after delivery including shoulder dystocia . Patient had no  laceration. Perineal healing reviewed. Patient expressed understanding - Patient has urinary incontinence? Yes. Discussed role of pelvic floor PT. Offered PT and patient accepted. Patient was referred to pelvic floor PT.  - Patient is safe to resume physical and sexual activity  6.  Health Maintenance - Last pap smear  Diagnosis  Date Value Ref Range Status  06/15/2021 - Low grade squamous intraepithelial lesion (LSIL) (A)  Corrected   Pap smear not done at today's visit.  -Breast Cancer screening indicated? No.   7. Chronic Disease/Pregnancy Condition follow up: Anemia  - PCP follow up  Concepcion Living, [place] for [facility], [place]

## 2022-02-19 NOTE — Progress Notes (Signed)
C/o dizziness and lightheadedness when standing after a few minutes. States does not go away when she sits. States she is drinking lots of fluids. States she doesn't think she is ready to go back to work. I explained for vaginal delivery is normally 6 weeks unless medical issue and she can discuss with provider.  [name]

## 2022-03-09 ENCOUNTER — Emergency Department (HOSPITAL_COMMUNITY)
Admission: EM | Admit: 2022-03-09 | Discharge: 2022-03-09 | Discharge status: Against Medical Advice | Payer: Medicaid Other | Attending: Emergency Medicine | Admitting: Emergency Medicine

## 2022-03-09 ENCOUNTER — Encounter (HOSPITAL_COMMUNITY): Payer: Self-pay

## 2022-03-09 ENCOUNTER — Other Ambulatory Visit: Payer: Self-pay

## 2022-03-09 DIAGNOSIS — R109 Unspecified abdominal pain: Secondary | ICD-10-CM | POA: Diagnosis not present

## 2022-03-09 DIAGNOSIS — Z5321 Procedure and treatment not carried out due to patient leaving prior to being seen by health care provider: Secondary | ICD-10-CM | POA: Insufficient documentation
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
## 2022-03-09 NOTE — ED Notes (Signed)
Pt stated she no longer wanted medical attention and for the police to just take her to jail. This RN spoke with the patient and had patient sign AMA forms.

## 2022-03-12 NOTE — L&D Delivery Note (Signed)
Delivery Note At 1810 a viable female was delivered via Vaginal, Spontaneous (Presentation: Right Occiput Anterior). Anterior shoulder delivered with ease. APGAR: 9, 9; weight pending. Baby placed skin-to-skin w/ mom. Delayed cord clamping x 1 minutes. Cord clamped x 2 and cut by family members at bedside.  Placenta status: intact  Cord: 3 vessels Fundus firm with massage and Pitocin.  Anesthesia: Epidural Episiotomy: None Perineum: intact Est. Blood Loss (mL): 50  Mom to postpartum.   Baby to Couplet care / Skin to Skin.  Charma Igo, MD 12/11/2022 7:21 PM

## 2022-04-06 ENCOUNTER — Telehealth: Payer: Self-pay | Admitting: Clinical

## 2022-04-06 NOTE — Telephone Encounter (Signed)
Follow up with pt to schedule appointment; pt requests numbers to call to schedule after she has her upcoming work schedule.

## 2022-04-17 ENCOUNTER — Emergency Department (HOSPITAL_COMMUNITY): Payer: Medicaid Other

## 2022-04-17 ENCOUNTER — Other Ambulatory Visit: Payer: Self-pay

## 2022-04-17 ENCOUNTER — Emergency Department (HOSPITAL_COMMUNITY)
Admission: EM | Admit: 2022-04-17 | Discharge: 2022-04-17 | Discharge status: Against Medical Advice | Payer: Medicaid Other | Attending: Emergency Medicine | Admitting: Emergency Medicine

## 2022-04-17 ENCOUNTER — Encounter (HOSPITAL_COMMUNITY): Payer: Self-pay

## 2022-04-17 ENCOUNTER — Inpatient Hospital Stay (EMERGENCY_DEPARTMENT_HOSPITAL)
Admission: AD | Admit: 2022-04-17 | Discharge: 2022-04-18 | Disposition: A | Discharge status: Home / Self Care | Payer: Medicaid Other | Source: Home / Self Care | Attending: Obstetrics and Gynecology | Admitting: Obstetrics and Gynecology

## 2022-04-17 ENCOUNTER — Encounter (HOSPITAL_COMMUNITY): Payer: Self-pay | Admitting: Obstetrics and Gynecology

## 2022-04-17 DIAGNOSIS — O26891 Other specified pregnancy related conditions, first trimester: Secondary | ICD-10-CM | POA: Insufficient documentation

## 2022-04-17 DIAGNOSIS — R0789 Other chest pain: Secondary | ICD-10-CM | POA: Diagnosis not present

## 2022-04-17 DIAGNOSIS — O209 Hemorrhage in early pregnancy, unspecified: Secondary | ICD-10-CM | POA: Insufficient documentation

## 2022-04-17 DIAGNOSIS — O99281 Endocrine, nutritional and metabolic diseases complicating pregnancy, first trimester: Secondary | ICD-10-CM | POA: Diagnosis not present

## 2022-04-17 DIAGNOSIS — Z79899 Other long term (current) drug therapy: Secondary | ICD-10-CM | POA: Insufficient documentation

## 2022-04-17 DIAGNOSIS — N83209 Unspecified ovarian cyst, unspecified side: Secondary | ICD-10-CM

## 2022-04-17 DIAGNOSIS — I10 Essential (primary) hypertension: Secondary | ICD-10-CM | POA: Insufficient documentation

## 2022-04-17 DIAGNOSIS — E876 Hypokalemia: Secondary | ICD-10-CM | POA: Insufficient documentation

## 2022-04-17 DIAGNOSIS — Z5329 Procedure and treatment not carried out because of patient's decision for other reasons: Secondary | ICD-10-CM | POA: Insufficient documentation

## 2022-04-17 DIAGNOSIS — O3480 Maternal care for other abnormalities of pelvic organs, unspecified trimester: Secondary | ICD-10-CM | POA: Insufficient documentation

## 2022-04-17 DIAGNOSIS — R102 Pelvic and perineal pain: Secondary | ICD-10-CM | POA: Diagnosis not present

## 2022-04-17 DIAGNOSIS — O23592 Infection of other part of genital tract in pregnancy, second trimester: Secondary | ICD-10-CM | POA: Insufficient documentation

## 2022-04-17 DIAGNOSIS — N83291 Other ovarian cyst, right side: Secondary | ICD-10-CM | POA: Insufficient documentation

## 2022-04-17 DIAGNOSIS — O99891 Other specified diseases and conditions complicating pregnancy: Secondary | ICD-10-CM | POA: Diagnosis not present

## 2022-04-17 DIAGNOSIS — Z3A01 Less than 8 weeks gestation of pregnancy: Secondary | ICD-10-CM | POA: Insufficient documentation

## 2022-04-17 DIAGNOSIS — B9689 Other specified bacterial agents as the cause of diseases classified elsewhere: Secondary | ICD-10-CM | POA: Insufficient documentation

## 2022-04-17 DIAGNOSIS — Z5321 Procedure and treatment not carried out due to patient leaving prior to being seen by health care provider: Secondary | ICD-10-CM

## 2022-04-17 DIAGNOSIS — Z3490 Encounter for supervision of normal pregnancy, unspecified, unspecified trimester: Secondary | ICD-10-CM

## 2022-04-17 DIAGNOSIS — N760 Acute vaginitis: Secondary | ICD-10-CM | POA: Diagnosis not present

## 2022-04-17 LAB — URINALYSIS, ROUTINE W REFLEX MICROSCOPIC
Bilirubin Urine: NEGATIVE
Glucose, UA: NEGATIVE mg/dL
Hgb urine dipstick: NEGATIVE
Ketones, ur: NEGATIVE mg/dL
Nitrite: POSITIVE — AB
Protein, ur: NEGATIVE mg/dL
Specific Gravity, Urine: 1.019 (ref 1.005–1.030)
pH: 6 (ref 5.0–8.0)

## 2022-04-17 LAB — CBC
HCT: 35.7 % — ABNORMAL LOW (ref 36.0–46.0)
Hemoglobin: 11.7 g/dL — ABNORMAL LOW (ref 12.0–15.0)
MCH: 24.8 pg — ABNORMAL LOW (ref 26.0–34.0)
MCHC: 32.8 g/dL (ref 30.0–36.0)
MCV: 75.8 fL — ABNORMAL LOW (ref 80.0–100.0)
Platelets: 269 10*3/uL (ref 150–400)
RBC: 4.71 MIL/uL (ref 3.87–5.11)
RDW: 17.8 % — ABNORMAL HIGH (ref 11.5–15.5)
WBC: 8.6 10*3/uL (ref 4.0–10.5)
nRBC: 0 % (ref 0.0–0.2)

## 2022-04-17 LAB — COMPREHENSIVE METABOLIC PANEL
ALT: 17 U/L (ref 0–44)
AST: 23 U/L (ref 15–41)
Albumin: 3.6 g/dL (ref 3.5–5.0)
Alkaline Phosphatase: 115 U/L (ref 38–126)
Anion gap: 11 (ref 5–15)
BUN: 12 mg/dL (ref 6–20)
CO2: 22 mmol/L (ref 22–32)
Calcium: 9.1 mg/dL (ref 8.9–10.3)
Chloride: 103 mmol/L (ref 98–111)
Creatinine, Ser: 0.85 mg/dL (ref 0.44–1.00)
GFR, Estimated: >60 mL/min (ref >60)
Glucose, Bld: 103 mg/dL — ABNORMAL HIGH (ref 70–99)
Potassium: 3.2 mmol/L — ABNORMAL LOW (ref 3.5–5.1)
Sodium: 136 mmol/L (ref 135–145)
Total Bilirubin: 0.3 mg/dL (ref 0.3–1.2)
Total Protein: 7.1 g/dL (ref 6.5–8.1)

## 2022-04-17 LAB — D-DIMER, QUANTITATIVE: D-Dimer, Quant: 1.18 ug/mL-FEU — ABNORMAL HIGH (ref 0.00–0.50)

## 2022-04-17 LAB — I-STAT BETA HCG BLOOD, ED (MC, WL, AP ONLY): I-stat hCG, quantitative: 780.9 m[IU]/mL — ABNORMAL HIGH (ref <5)

## 2022-04-17 LAB — TROPONIN I (HIGH SENSITIVITY)
Troponin I (High Sensitivity): 3 ng/L (ref <18)
Troponin I (High Sensitivity): 3 ng/L (ref <18)

## 2022-04-17 MED ORDER — ACETAMINOPHEN 500 MG PO TABS
1000.0000 mg | ORAL_TABLET | Freq: Once | ORAL | Status: DC
  Filled 2022-04-17: qty 2

## 2022-04-17 NOTE — ED Triage Notes (Signed)
Arrives via EMS from home, Pt C/O Pelvic pain and chest pain that worsens when touching starting this morning, Positive Preg 1 Feb, last delivery Oct 2023,

## 2022-04-17 NOTE — ED Notes (Signed)
Pt was called on cell phone to find out where she was. Pt stated " no one was listening to me, no one cares about me, the lady wasn't listening to me. I didn't want to be here any longer so I left. I'm not coming back. RN explained that she may have some clots in her lungs and Its important that she knows for sure. Pt stated " I dont care, Ill just fall over. Im not coming back". Pt still has IV in [place]. She did not remove it stating "I'm not coming back. Illget someone to remove it". MD and PA aware.

## 2022-04-17 NOTE — ED Notes (Signed)
MD aware of patient not present

## 2022-04-17 NOTE — ED Provider Notes (Signed)
[facility] Provider Note   CSN: [id number] Arrival date & time: 04/17/22  [id number]     History  Chief Complaint  Patient presents with   Hip Pain   Chest Pain    Leslie Skinner is a 27 y.o. female with history of anxiety, GERD, goiter, hypertension, migraines, obesity, prediabetes, pregnancy-induced hypertension, spinal headache and scoliosis who presents the emergency department complaining of chest pain and lower pelvic pain.  Patient states that she went to urgent care yesterday and was told that she was pregnant based on a urine sample, and was also called today and told that she likely has bacterial vaginosis.  This morning she started developing chest tightness.  She reports nodding off to sleep, and waking up feeling smothered and like she cannot catch her breath.  Her tightness is not made worse with deep breathing or exertion.  Denies any pain or swelling in her legs.  Denies any history of blood clots.  Reports a family history of heart disease.  No known family history of sudden cardiac death.  She has been experiencing some vaginal discharge and dysuria along with the pelvic pressure, but no vaginal bleeding.  She says this is her fourth pregnancy.  She just gave birth in October 2023.   Hip Pain Associated symptoms include chest pain, abdominal pain and shortness of breath.  Chest Pain Associated symptoms: abdominal pain and shortness of breath   Associated symptoms: no cough        Home Medications Prior to Admission medications   Medication Sig Start Date End Date Taking? Authorizing Provider  acetaminophen (TYLENOL) 325 MG tablet Take 2 tablets (650 mg total) by mouth every 6 (six) hours as needed (for pain scale < 4). Patient not taking: Reported on 04/17/2022 01/01/22   Janyth Pupa, DO  ibuprofen (ADVIL) 600 MG tablet Take 1 tablet (600 mg total) by mouth every 6 (six) hours. Patient not taking: Reported on 04/17/2022  01/01/22   Janyth Pupa, DO  labetalol (NORMODYNE) 300 MG tablet Take 1 tablet (300 mg total) by mouth 2 (two) times daily. Patient not taking: Reported on 04/17/2022 01/10/22   Julianne Handler, CNM  norethindrone (ORTHO MICRONOR) 0.35 MG tablet Take 1 tablet (0.35 mg total) by mouth daily. Patient not taking: Reported on 04/17/2022 01/01/22   Janyth Pupa, DO  Prenatal Vit-Fe Fumarate-FA (MULTIVITAMIN-PRENATAL) 27-0.8 MG TABS tablet Take 1 tablet by mouth daily at 12 noon. Patient not taking: Reported on 04/17/2022    [provider]      Allergies    Morphine and related, Tramadol, and Pitocin [oxytocin]    Review of Systems   Review of Systems  Respiratory:  Positive for shortness of breath. Negative for cough and wheezing.   Cardiovascular:  Positive for chest pain. Negative for leg swelling.  Gastrointestinal:  Positive for abdominal pain.  Genitourinary:  Positive for dysuria, pelvic pain and vaginal discharge.  All other systems reviewed and are negative.   Physical Exam Updated Vital Signs BP (!) 115/94   Pulse 88   Temp 98 F (36.7 C) (Oral)   Resp 16   Ht 5' 2"$  (1.575 m)   Wt 108.9 kg   SpO2 100%   BMI 43.90 kg/m  Physical Exam Vitals and nursing note reviewed.  Constitutional:      Appearance: Normal appearance.  HENT:     Head: Normocephalic and atraumatic.  Eyes:     Conjunctiva/sclera: Conjunctivae normal.  Cardiovascular:  Rate and Rhythm: Normal rate and regular rhythm.  Pulmonary:     Effort: Pulmonary effort is normal. No respiratory distress.     Breath sounds: Normal breath sounds.  Abdominal:     General: There is no distension.     Palpations: Abdomen is soft.     Tenderness: There is no abdominal tenderness.  Musculoskeletal:     Right lower leg: No edema.     Left lower leg: No edema.  Skin:    General: Skin is warm and dry.  Neurological:     General: No focal deficit present.     Mental Status: She is alert.     ED  Results / Procedures / Treatments   Labs (all labs ordered are listed, but only abnormal results are displayed) Labs Reviewed  CBC - Abnormal; Notable for the following components:      Result Value   Hemoglobin 11.7 (*)    HCT 35.7 (*)    MCV 75.8 (*)    MCH 24.8 (*)    RDW 17.8 (*)    All other components within normal limits  COMPREHENSIVE METABOLIC PANEL - Abnormal; Notable for the following components:   Potassium 3.2 (*)    Glucose, Bld 103 (*)    All other components within normal limits  URINALYSIS, ROUTINE W REFLEX MICROSCOPIC - Abnormal; Notable for the following components:   APPearance HAZY (*)    Nitrite POSITIVE (*)    Leukocytes,Ua SMALL (*)    Bacteria, UA RARE (*)    All other components within normal limits  D-DIMER, QUANTITATIVE - Abnormal; Notable for the following components:   D-Dimer, Quant 1.18 (*)    All other components within normal limits  I-STAT BETA HCG BLOOD, ED ([facility], [facility], [facility] ONLY) - Abnormal; Notable for the following components:   I-stat hCG, quantitative 780.9 (*)    All other components within normal limits  TROPONIN I (HIGH SENSITIVITY)  TROPONIN I (HIGH SENSITIVITY)    EKG EKG Interpretation  Date/Time:  Tuesday April 17 2022 08:36:14 EST Ventricular Rate:  88 PR Interval:  140 QRS Duration: 80 QT Interval:  373 QTC Calculation: 452 R Axis:   65 Text Interpretation: Sinus rhythm Abnormal Q suggests anterior infarct Borderline T abnormalities, anterior leads  Nonspecific ST cahnges Confirmed by Nanda Quinton [phone]) on 04/17/2022 10:45:51 AM  Radiology No results found.  Procedures Procedures    Medications Ordered in ED Medications  acetaminophen (TYLENOL) tablet 1,000 mg (has no administration in time range)    ED Course/ Medical Decision Making/ A&P                             Medical Decision Making Amount and/or Complexity of Data Reviewed Labs: ordered. Radiology: ordered.  Risk OTC drugs.  This patient is a  27 y.o. female  who presents to the ED for concern of chest pain and pelvic discomfort.   Differential diagnoses prior to evaluation: The emergent differential diagnosis includes, but is not limited to,  ACS, pericarditis, myocarditis, aortic dissection, PE, pneumothorax, esophageal spasm or rupture, chronic angina, pneumonia, bronchitis, GERD, reflux/PUD, biliary disease, pancreatitis, costochondritis, anxiety, ectopic pregnancy, UTI, PID. This is not an exhaustive differential.   Past Medical History / Co-morbidities: anxiety, GERD, goiter, hypertension, migraines, obesity, prediabetes, pregnancy-induced hypertension, spinal headache and scoliosis  Additional history: Chart reviewed. Pertinent results include: [facility] care note from yesterday showed positive Gardernella vaginalis and positive pregnancy.  Physical Exam: Physical exam performed. The pertinent findings include: Mildly hypertensive, otherwise normal vital signs.  Heart regular rate and rhythm, lung sounds clear.  No increased respiratory effort.  No leg swelling.  Normal oxygen saturation on room air.  Lab Tests/Imaging studies: I personally interpreted labs/imaging and the pertinent results include: No leukocytosis, hemoglobin stable compared to prior.  CMP grossly unremarkable, potassium little low at 3.2.  Initial troponin 3, delta troponin 3.  Beta hCG 780.9, consistent with approximately [redacted] weeks gestation pregnancy.  Urinalysis with positive nitrite, small leukocytes, 21-50 WBCs, and rare bacteria, consistent with UTI.  D-dimer 1.18.  CT PE study ordered.  Patient had consented to scan in the setting of early pregnancy.  Explained that we would attempt to shield her abdomen.  Explained that the risk of missing a pulmonary embolism was far greater than risk to the fetus. Extensive conversation was had and time left for patient questions. Patient agreeable to this and agreed to sign consent form.  Cardiac  monitoring: EKG obtained and interpreted by my attending physician which shows: Sinus rhythm with nonspecific ST changes   Medications: I ordered medication including Tylenol, not given prior to ER elopment.  I have reviewed the patients home medicines and have made adjustments as needed.   Disposition: Patient eloped from the emergency department without notifying staff.  Nursing staff called the patient, and she reported that she was not coming back.  She specifically voiced that she did not feel her concerns were being heard and no one was listening to her. Her results were explained to her and the risks of leaving, patient said "I don't care, I'll just fall over. I'm not coming back." She reported she will find someone to remove her IV for her.   I discussed this case with my attending physician Dr. Laverta Baltimore who cosigned this note including patient's presenting symptoms, physical exam, and planned diagnostics and interventions. Attending physician stated agreement with plan or made changes to plan which were implemented.   Final Clinical Impression(s) / ED Diagnoses Final diagnoses:  Eloped from emergency department  Chest tightness  Pelvic pain  Early stage of pregnancy    Rx / DC Orders ED Discharge Orders     None      Portions of this report may have been transcribed using voice recognition software. Every effort was made to ensure accuracy; however, inadvertent computerized transcription errors may be present.    Estill Cotta 04/17/22 1315    Margette Fast, MD 04/20/22 [phone]

## 2022-04-17 NOTE — ED Notes (Signed)
Pt not in the room when RN attempted to give ordered medicines.Marland Kitchen

## 2022-04-18 ENCOUNTER — Inpatient Hospital Stay (HOSPITAL_COMMUNITY): Payer: Medicaid Other

## 2022-04-18 DIAGNOSIS — N760 Acute vaginitis: Secondary | ICD-10-CM

## 2022-04-18 DIAGNOSIS — B9689 Other specified bacterial agents as the cause of diseases classified elsewhere: Secondary | ICD-10-CM

## 2022-04-18 LAB — CBC
HCT: 32.9 % — ABNORMAL LOW (ref 36.0–46.0)
Hemoglobin: 10.7 g/dL — ABNORMAL LOW (ref 12.0–15.0)
MCH: 24.4 pg — ABNORMAL LOW (ref 26.0–34.0)
MCHC: 32.5 g/dL (ref 30.0–36.0)
MCV: 75.1 fL — ABNORMAL LOW (ref 80.0–100.0)
Platelets: 292 10*3/uL (ref 150–400)
RBC: 4.38 MIL/uL (ref 3.87–5.11)
RDW: 17.6 % — ABNORMAL HIGH (ref 11.5–15.5)
WBC: 9.7 10*3/uL (ref 4.0–10.5)
nRBC: 0 % (ref 0.0–0.2)

## 2022-04-18 LAB — URINALYSIS, ROUTINE W REFLEX MICROSCOPIC
Bilirubin Urine: NEGATIVE
Glucose, UA: NEGATIVE mg/dL
Hgb urine dipstick: NEGATIVE
Ketones, ur: 5 mg/dL — AB
Nitrite: NEGATIVE
Protein, ur: 30 mg/dL — AB
Specific Gravity, Urine: 1.029 (ref 1.005–1.030)
pH: 5 (ref 5.0–8.0)

## 2022-04-18 LAB — GC/CHLAMYDIA PROBE AMP (~~LOC~~) NOT AT ARMC
Chlamydia: NEGATIVE
Comment: NEGATIVE
Comment: NORMAL
Neisseria Gonorrhea: NEGATIVE

## 2022-04-18 LAB — ABO/RH: ABO/RH(D): A POS

## 2022-04-18 LAB — HCG, QUANTITATIVE, PREGNANCY: hCG, Beta Chain, Quant, S: 1099 m[IU]/mL — ABNORMAL HIGH (ref <5)

## 2022-04-18 LAB — WET PREP, GENITAL
Sperm: NONE SEEN
Trich, Wet Prep: NONE SEEN
WBC, Wet Prep HPF POC: <10 (ref <10)
Yeast Wet Prep HPF POC: NONE SEEN

## 2022-04-18 MED ORDER — METRONIDAZOLE 500 MG PO TABS: 500.0000 mg | ORAL_TABLET | Freq: Two times a day (BID) | ORAL | 0 refills | Status: AC

## 2022-04-18 MED ORDER — ACETAMINOPHEN 500 MG PO TABS: 1000.0000 mg | ORAL_TABLET | Freq: Once | ORAL | Status: DC

## 2022-04-18 NOTE — MAU Provider Note (Addendum)
History     CSN: [id number]  Arrival date and time: 04/17/22 2346   Event Date/Time   First Provider Initiated Contact with Patient 04/18/22 [phone]      Chief Complaint  Patient presents with   Vaginal Bleeding   Leslie Skinner , a  27 y.o. [id number] at Unknown presents to MAU with complaints on on going pelvic pain since Monday. Patient reports she was seen at [facility] on 2/1 for pelvic pain and was noted to be pregnant. On Monday of this week she went to the [facility] for Chest pain and Pelvic pain. She states she left prior to exam being completed d/t having to go to work She report on-going pelvic pain since Monday.Describes pain as achy and cramping "discomfort". Denies attempting to relieve symptoms. Currently rates pain a 5/10. She also complains of grey vaginal discharge with burning and itching with urination. She states last intercourse was Monday and was painful. She denies vaginal bleeding.     OB History     Gravida  4   Para  3   Term  3   Preterm      AB  0   Living  3      SAB  0   IAB      Ectopic      Multiple  0   Live Births  3           Past Medical History:  Diagnosis Date   Acanthosis nigricans, acquired    Anxiety state 06/12/2012   GERD (gastroesophageal reflux disease)    Goiter    Hypertension    LGSIL on Pap smear of cervix with +HRHPV on 06/15/21 06/26/2021   Migraines    Obesity    Osteochondroma of lower leg    Pollen allergies    Prediabetes    Reports having DM as a child, but not when older? No records.  Normal/prediabetic A1Cs   Pregnancy induced hypertension    Scoliosis    Spinal headache     Past Surgical History:  Procedure Laterality Date   LOWER LEG SOFT TISSUE TUMOR EXCISION  2011   OSTEOCHONDROMA EXCISION      Family History  Problem Relation Age of Onset   Diabetes Mother    Ulcers Mother    Thyroid disease Mother    Cancer Mother    Obesity Mother    Heart disease Mother    Heart attack Mother         caused death    Diabetes Maternal Grandmother    Stroke Maternal Grandmother    Ulcers Maternal Grandmother    Diabetes Maternal Grandfather    GER disease Maternal Grandfather    Heart disease Maternal Grandfather    GER disease Sister    Diabetes Father    GER disease Father    Obesity Sister    Diabetes Paternal Grandmother    Thyroid disease Paternal Grandmother    Diabetes Paternal Grandfather    Heart disease Maternal Aunt    Diabetes Maternal Aunt    Thyroid disease Maternal Aunt    Cancer Maternal Aunt    Obesity Maternal Aunt    GER disease Maternal Aunt     Social History   Tobacco Use   Smoking status: Never    Passive exposure: Yes   Smokeless tobacco: Never  Vaping Use   Vaping Use: Never used  Substance Use Topics   Alcohol use: No   Drug use: No  Allergies:  Allergies  Allergen Reactions   Morphine And Related Other (See Comments)    Hot flashes, can't breathe   Tramadol Other (See Comments)    Hot flashes    Pitocin [Oxytocin] Itching and Rash    Per patient it occurred the last time she was induced.    Medications Prior to Admission  Medication Sig Dispense Refill Last Dose   acetaminophen (TYLENOL) 325 MG tablet Take 2 tablets (650 mg total) by mouth every 6 (six) hours as needed (for pain scale < 4). (Patient not taking: Reported on 04/17/2022)      ibuprofen (ADVIL) 600 MG tablet Take 1 tablet (600 mg total) by mouth every 6 (six) hours. (Patient not taking: Reported on 04/17/2022) 30 tablet 0    labetalol (NORMODYNE) 300 MG tablet Take 1 tablet (300 mg total) by mouth 2 (two) times daily. (Patient not taking: Reported on 04/17/2022) 60 tablet 1    norethindrone (ORTHO MICRONOR) 0.35 MG tablet Take 1 tablet (0.35 mg total) by mouth daily. (Patient not taking: Reported on 04/17/2022) 28 tablet 11    Prenatal Vit-Fe Fumarate-FA (MULTIVITAMIN-PRENATAL) 27-0.8 MG TABS tablet Take 1 tablet by mouth daily at 12 noon. (Patient not taking: Reported on  04/17/2022)       Review of Systems  Constitutional:  Negative for chills, fatigue and fever.  Eyes:  Negative for pain and visual disturbance.  Respiratory:  Negative for apnea, shortness of breath and wheezing.   Cardiovascular:  Negative for chest pain and palpitations.  Gastrointestinal:  Negative for abdominal pain, constipation, diarrhea, nausea and vomiting.  Genitourinary:  Positive for difficulty urinating, pelvic pain, urgency and vaginal discharge. Negative for dysuria, vaginal bleeding and vaginal pain.  Musculoskeletal:  Negative for back pain.  Neurological:  Negative for seizures, weakness and headaches.  Psychiatric/Behavioral:  Negative for suicidal ideas.    Physical Exam   Blood pressure 122/70, pulse 94, temperature 98.9 F (37.2 C), temperature source Oral, resp. rate 16, height '5\' 2"'$  (1.575 m), weight 111.7 kg, currently breastfeeding.  Physical Exam Vitals and nursing note reviewed.  Constitutional:      General: She is not in acute distress.    Appearance: Normal appearance.  HENT:     Head: Normocephalic.  Cardiovascular:     Rate and Rhythm: Normal rate and regular rhythm.  Pulmonary:     Effort: Pulmonary effort is normal.  Musculoskeletal:        General: Normal range of motion.     Cervical back: Normal range of motion.  Skin:    General: Skin is warm and dry.     Capillary Refill: Capillary refill takes less than 2 seconds.  Neurological:     Mental Status: She is alert and oriented to person, place, and time.  Psychiatric:        Mood and Affect: Mood normal.     MAU Course  Procedures Orders Placed This Encounter  Procedures   Wet prep, genital   US OB LESS THAN 14 WEEKS WITH OB TRANSVAGINAL   CBC   hCG, quantitative, pregnancy   Urinalysis, Routine w reflex microscopic -Urine, Clean Catch   Diet NPO time specified   ABO/Rh   Discharge patient   Meds ordered this encounter  Medications   acetaminophen (TYLENOL) tablet 1,000 mg    metroNIDAZOLE (FLAGYL) 500 MG tablet    Sig: Take 1 tablet (500 mg total) by mouth 2 (two) times daily for 7 days.    Dispense:  14  tablet    Refill:  0    Order Specific Question:   Supervising Provider    Answer:   CONSTANT, PEGGY [[id number]]    Results for orders placed or performed during the hospital encounter of 04/17/22 (from the past 24 hour(s))  Wet prep, genital     Status: Abnormal   Collection Time: 04/18/22 12:10 AM   Specimen: PATH Cytology Cervicovaginal Ancillary Only  Result Value Ref Range   Yeast Wet Prep HPF POC NONE SEEN NONE SEEN   Trich, Wet Prep NONE SEEN NONE SEEN   Clue Cells Wet Prep HPF POC PRESENT (A) NONE SEEN   WBC, Wet Prep HPF POC <10 <10   Sperm NONE SEEN   Urinalysis, Routine w reflex microscopic -Urine, Clean Catch     Status: Abnormal   Collection Time: 04/18/22 12:24 AM  Result Value Ref Range   Color, Urine YELLOW YELLOW   APPearance HAZY (A) CLEAR   Specific Gravity, Urine 1.029 1.005 - 1.030   pH 5.0 5.0 - 8.0   Glucose, UA NEGATIVE NEGATIVE mg/dL   Hgb urine dipstick NEGATIVE NEGATIVE   Bilirubin Urine NEGATIVE NEGATIVE   Ketones, ur 5 (A) NEGATIVE mg/dL   Protein, ur 30 (A) NEGATIVE mg/dL   Nitrite NEGATIVE NEGATIVE   Leukocytes,Ua TRACE (A) NEGATIVE   RBC / HPF 0-5 0 - 5 RBC/hpf   WBC, UA 0-5 0 - 5 WBC/hpf   Bacteria, UA RARE (A) NONE SEEN   Squamous Epithelial / HPF 0-5 0 - 5 /HPF   Mucus PRESENT   CBC     Status: Abnormal   Collection Time: 04/18/22 12:49 AM  Result Value Ref Range   WBC 9.7 4.0 - 10.5 K/uL   RBC 4.38 3.87 - 5.11 MIL/uL   Hemoglobin 10.7 (L) 12.0 - 15.0 g/dL   HCT 32.9 (L) 36.0 - 46.0 %   MCV 75.1 (L) 80.0 - 100.0 fL   MCH 24.4 (L) 26.0 - 34.0 pg   MCHC 32.5 30.0 - 36.0 g/dL   RDW 17.6 (H) 11.5 - 15.5 %   Platelets 292 150 - 400 K/uL   nRBC 0.0 0.0 - 0.2 %  ABO/Rh     Status: None   Collection Time: 04/18/22 12:49 AM  Result Value Ref Range   ABO/RH(D) A POS    No rh immune globuloin      NOT A RH IMMUNE  GLOBULIN CANDIDATE, PT RH POSITIVE Performed at [facility], [address] [place], [place] [address]   hCG, quantitative, pregnancy     Status: Abnormal   Collection Time: 04/18/22 12:49 AM  Result Value Ref Range   hCG, Beta Chain, Quant, S 1,099 (H) <5 mIU/mL   US OB LESS THAN 14 WEEKS WITH OB TRANSVAGINAL  Result Date: 04/18/2022 CLINICAL DATA:  Pelvic pain. EXAM: OBSTETRIC <14 WK [place] AND TRANSVAGINAL OB US TECHNIQUE: Both transabdominal and transvaginal ultrasound examinations were performed for complete evaluation of the gestation as well as the maternal uterus, adnexal regions, and pelvic cul-de-sac. Transvaginal technique was performed to assess early pregnancy. COMPARISON:  None Available. FINDINGS: Intrauterine gestational sac: Single Yolk sac:  Visualized. Embryo:  Not Visualized. Cardiac Activity: Not Visualized. Heart Rate: N/A  bpm MSD: A 4.5 mm   5 w   2 d Subchorionic hemorrhage:  None visualized. Maternal uterus/adnexae: A 2.7 cm x 3.5 cm x 3.5 cm complex right ovarian cyst is seen. The left ovary is visualized and is normal in appearance.  No pelvic free fluid is noted. IMPRESSION: 1. Probable early intrauterine gestational sac and yolk sac, but no fetal pole or cardiac activity yet visualized. Recommend follow-up quantitative B-HCG levels and follow-up US in 14 days to assess viability. This recommendation follows SRU consensus guidelines: Diagnostic Criteria for Nonviable Pregnancy Early in the First Trimester. [employer] Med 2013; 606:3016-01. 2. Complex right ovarian cyst. Correlation with 6 week follow-up pelvic ultrasound is recommended to determine stability. Electronically Signed   By: Virgina Norfolk M.D.   On: 04/18/2022 01:33    MDM - UA reflexed to culture.   - Report given to E. Pami Wool NP. Transfer of care given at 0050.  Shantonette Isaias Sakai) Rollene Rotunda, MSN, [place] [facility]  04/18/22 12:56 AM     Ultrasound shows IUGS with yolk sac & HCG has  increased from this morning. Will schedule for viability scan in the office.   Wet prep positive for clue cells & patient complaining of gray discharge. Will tx for BV.   Assessment and Plan   1. Bacterial vaginosis  -Rx flagyl -GC/CT pending -reviewed reasons to return to MAU  2. Ovarian cyst during pregnancy in first trimester  -Complex right ovarian cyst. Take tylenol prn. -Viability ultrasound scheduled at [facility] later this month    Jorje Guild, NP

## 2022-04-18 NOTE — Discharge Instructions (Signed)
Return to care  If you have heavier bleeding that soaks through more than 2 pads per hour for an hour or more If you bleed so much that you feel like you might pass out or you do pass out If you have significant abdominal pain that is not improved with Tylenol   

## 2022-04-18 NOTE — MAU Note (Addendum)
Went to [facility] at 0600- for CP and abd pain - discovered preg. Pt says she had Vag del in 12-2021- and has not had cycle since then .   No VB Feels lower abd pain- started this am-  Was feeling CP this am - then abd pain started - pain is same now.  In [place]- pt walked out - came to work - worked all day -- then came here - she works [employer] in [employer] .  Last sex- Mon

## 2022-05-08 ENCOUNTER — Other Ambulatory Visit: Payer: Self-pay | Admitting: Student

## 2022-05-08 ENCOUNTER — Other Ambulatory Visit (INDEPENDENT_AMBULATORY_CARE_PROVIDER_SITE_OTHER): Payer: Medicaid Other

## 2022-05-08 DIAGNOSIS — Z3A01 Less than 8 weeks gestation of pregnancy: Secondary | ICD-10-CM

## 2022-05-08 DIAGNOSIS — O3680X0 Pregnancy with inconclusive fetal viability, not applicable or unspecified: Secondary | ICD-10-CM

## 2022-06-12 ENCOUNTER — Encounter: Payer: Medicaid Other | Admitting: Obstetrics

## 2022-06-12 DIAGNOSIS — O09899 Supervision of other high risk pregnancies, unspecified trimester: Secondary | ICD-10-CM | POA: Insufficient documentation

## 2022-06-12 HISTORY — DX: Supervision of other high risk pregnancies, unspecified trimester: O09.899

## 2022-06-25 ENCOUNTER — Ambulatory Visit (INDEPENDENT_AMBULATORY_CARE_PROVIDER_SITE_OTHER): Payer: Medicaid Other | Admitting: Licensed Clinical Social Worker

## 2022-06-25 ENCOUNTER — Ambulatory Visit (INDEPENDENT_AMBULATORY_CARE_PROVIDER_SITE_OTHER): Payer: Medicaid Other | Admitting: Obstetrics and Gynecology

## 2022-06-25 ENCOUNTER — Other Ambulatory Visit: Payer: Self-pay | Admitting: Obstetrics and Gynecology

## 2022-06-25 ENCOUNTER — Encounter: Payer: Self-pay | Admitting: Obstetrics and Gynecology

## 2022-06-25 ENCOUNTER — Other Ambulatory Visit (HOSPITAL_COMMUNITY)
Admission: RE | Admit: 2022-06-25 | Discharge: 2022-06-25 | Disposition: A | Discharge status: Home / Self Care | Payer: Medicaid Other | Source: Ambulatory Visit | Attending: Obstetrics | Admitting: Obstetrics

## 2022-06-25 VITALS — BP 118/80 | HR 92 | Wt 260.0 lb

## 2022-06-25 DIAGNOSIS — Z8659 Personal history of other mental and behavioral disorders: Secondary | ICD-10-CM | POA: Diagnosis not present

## 2022-06-25 DIAGNOSIS — O09892 Supervision of other high risk pregnancies, second trimester: Secondary | ICD-10-CM | POA: Diagnosis not present

## 2022-06-25 DIAGNOSIS — O09292 Supervision of pregnancy with other poor reproductive or obstetric history, second trimester: Secondary | ICD-10-CM | POA: Diagnosis not present

## 2022-06-25 DIAGNOSIS — Z3A14 14 weeks gestation of pregnancy: Secondary | ICD-10-CM

## 2022-06-25 DIAGNOSIS — O99212 Obesity complicating pregnancy, second trimester: Secondary | ICD-10-CM

## 2022-06-25 DIAGNOSIS — O09899 Supervision of other high risk pregnancies, unspecified trimester: Secondary | ICD-10-CM | POA: Diagnosis not present

## 2022-06-25 DIAGNOSIS — R87612 Low grade squamous intraepithelial lesion on cytologic smear of cervix (LGSIL): Secondary | ICD-10-CM

## 2022-06-25 DIAGNOSIS — Z3482 Encounter for supervision of other normal pregnancy, second trimester: Secondary | ICD-10-CM | POA: Diagnosis not present

## 2022-06-25 DIAGNOSIS — Z8759 Personal history of other complications of pregnancy, childbirth and the puerperium: Secondary | ICD-10-CM

## 2022-06-25 DIAGNOSIS — Z6841 Body Mass Index (BMI) 40.0 and over, adult: Secondary | ICD-10-CM

## 2022-06-25 DIAGNOSIS — O09299 Supervision of pregnancy with other poor reproductive or obstetric history, unspecified trimester: Secondary | ICD-10-CM

## 2022-06-25 MED ORDER — ASPIRIN 81 MG PO CHEW: 81.0000 mg | CHEWABLE_TABLET | Freq: Every day | ORAL | 8 refills | Status: DC

## 2022-06-25 NOTE — Patient Instructions (Signed)

## 2022-06-25 NOTE — Progress Notes (Signed)
Pt wanted to discuss hx hypertension, does she need meds.  Pt complains of nausea and also request Rx for muscle relaxer.

## 2022-06-25 NOTE — Progress Notes (Signed)
INITIAL PRENATAL VISIT NOTE  Subjective:  Leslie Skinner is a 27 y.o. [phone] at [redacted]w[redacted]d by LMP and early u/s being seen today for her initial prenatal visit. She has an obstetric history significant for hx of shoulder dystocia and congenital heart defect. She has a medical history significant for obesity, CHTN (not currently), prediabetes and LGSIL.  Patient reports backache.  Contractions: Not present. Vag. Bleeding: None.   . Denies leaking of fluid.    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 No records.  Normal/prediabetic A1Cs   Pregnancy induced hypertension    Scoliosis    Spinal headache     Past Surgical History:  Procedure Laterality Date   LOWER LEG SOFT TISSUE TUMOR EXCISION  2011   OSTEOCHONDROMA EXCISION      OB History  Gravida Para Term Preterm AB Living  4 3 3    0 3  SAB IAB Ectopic Multiple Live Births  0     0 3    # Outcome Date GA Lbr Len/2nd Weight Sex Delivery Anes PTL Lv  4 Current           3 Term 12/30/21 [redacted]w[redacted]d 12:39 / 01:08 8 lb 5.7 oz (3.79 kg) F Vag-Spont EPI  LIV  2 Term 05/03/19 [redacted]w[redacted]d 04:39 / 02:18 7 lb 1.8 oz (3.225 kg) F Vag-Spont Spinal  LIV     Birth Comments: tetralogy of fallot  1 Term 10/25/17 [redacted]w[redacted]d 08:55 / 02:04 7 lb 7.4 oz (3.385 kg) M Vag-Spont EPI  LIV     Birth Comments: moulding    Social History   Socioeconomic History   Marital status: Single    Spouse name: Not on file   Number of children: 1   Years of education: 12th   Highest education level: High school graduate  Occupational History   Not on file  Tobacco Use   Smoking status: Never    Passive exposure: Yes   Smokeless  tobacco: Never  Vaping Use   Vaping Use: Never used  Substance and Sexual Activity   Alcohol use: No   Drug use: No   Sexual activity: Not Currently    Birth control/protection: None  Other Topics Concern   Not on file  Social History Narrative   Lives with Mom Elayne Snare), sister Cala Bradford Minor 1993), and nephew ([name], [name] 2011).   12th grade at [employer] - did not graduate- will finish fall 2014.      Patient is living with her sisters Cala Bradford Minor and Shanda Bumps Minor) as of May 2020. She has her one year old son [name]. Patient reported she has been do depressed/down that she went to live with siblings. She really only talk to her one year son, which he is really not talking. Clovis Pu, RN 12/15/18   Social Determinants of Health   Financial Resource Strain: Low Risk  (10/22/2017)   Overall Financial Resource Strain (CARDIA)    Difficulty of Paying Living Expenses: Not hard at all  Food Insecurity: No Food Insecurity (12/29/2021)  Hunger Vital Sign    Worried About Running Out of Food in the Last Year: Never true    Ran Out of Food in the Last Year: Never true  Transportation Needs: No Transportation Needs (12/29/2021)   PRAPARE - Administrator, Civil Service (Medical): No    Lack of Transportation (Non-Medical): No  Physical Activity: Inactive (12/15/2018)   Exercise Vital Sign    Days of Exercise per Week: 0 days    Minutes of Exercise per Session: 0 min  Stress: Stress Concern Present (12/15/2018)   [employer] of Occupational Health - Occupational Stress Questionnaire    Feeling of Stress : Very much  Social Connections: Moderately Isolated (12/15/2018)   Social Connection and Isolation Panel [NHANES]    Frequency of Communication with Friends and Family: More than three times a week    Frequency of Social Gatherings with Friends and Family: More than three times a week    Attends Religious Services: 1 to 4 times per year     Active Member of [employer] or Organizations: No    Attends Engineer, structural: Never    Marital Status: Never married    Family History  Problem Relation Age of Onset   Diabetes Mother    Ulcers Mother    Thyroid disease Mother    Cancer Mother    Obesity Mother    Heart disease Mother    Heart attack Mother        caused death    Diabetes Maternal Grandmother    Stroke Maternal Grandmother    Ulcers Maternal Grandmother    Diabetes Maternal Grandfather    GER disease Maternal Grandfather    Heart disease Maternal Grandfather    GER disease Sister    Diabetes Father    GER disease Father    Obesity Sister    Diabetes Paternal Grandmother    Thyroid disease Paternal Grandmother    Diabetes Paternal Grandfather    Heart disease Maternal Aunt    Diabetes Maternal Aunt    Thyroid disease Maternal Aunt    Cancer Maternal Aunt    Obesity Maternal Aunt    GER disease Maternal Aunt      Current Outpatient Medications:    aspirin 81 MG chewable tablet, Chew 1 tablet (81 mg total) by mouth daily., Disp: 30 tablet, Rfl: 8   Prenatal Vit-Fe Fumarate-FA (MULTIVITAMIN-PRENATAL) 27-0.8 MG TABS tablet, Take 1 tablet by mouth daily at 12 noon., Disp: , Rfl:    acetaminophen (TYLENOL) 325 MG tablet, Take 2 tablets (650 mg total) by mouth every 6 (six) hours as needed (for pain scale < 4). (Patient not taking: Reported on 04/17/2022), Disp: , Rfl:    labetalol (NORMODYNE) 300 MG tablet, Take 1 tablet (300 mg total) by mouth 2 (two) times daily. (Patient not taking: Reported on 04/17/2022), Disp: 60 tablet, Rfl: 1  Allergies  Allergen Reactions   Morphine And Related Other (See Comments)    Hot flashes, can't breathe   Tramadol Other (See Comments)    Hot flashes    Pitocin [Oxytocin] Itching and Rash    Per patient it occurred the last time she was induced.    Review of Systems: Negative except for what is mentioned in HPI.  Objective:   Vitals:   06/25/22 1106  BP:  118/80  Pulse: 92  Weight: 260 lb (117.9 kg)    Fetal Status: Fetal Heart Rate (bpm): 150  Physical Exam: BP 118/80   Pulse 92   Wt 260 lb (117.9 kg)   LMP 03/14/2022 Comment: pt says she had  baby in 12-2021- no cycle since then  BMI 47.55 kg/m  CONSTITUTIONAL: Well-developed, morbidly obese, well-nourished female in no acute distress.  NEUROLOGIC: Alert and oriented to person, place, and time. Normal reflexes, muscle tone coordination. No cranial nerve deficit noted. PSYCHIATRIC: Normal mood and affect. Normal behavior. Normal judgment and thought content. SKIN: Skin is warm and dry. No rash noted. Not diaphoretic. No erythema. No pallor. HENT:  Normocephalic, atraumatic, External right and left ear normal. Oropharynx is clear and moist EYES: Conjunctivae and EOM are normal.  NECK: Normal range of motion, supple, no masses CARDIOVASCULAR: Normal heart rate noted, regular rhythm RESPIRATORY: Effort and breath sounds normal, no problems with respiration noted BREASTS: deferred ABDOMEN: Soft, nontender, nondistended, gravid. GU: normal appearing external female genitalia, multiparous,normal appearing cervix, scant white discharge in vagina, no lesions noted, pap taken with chaperone present Bimanual: 14 weeks sized uterus, no adnexal tenderness or palpable lesions noted MUSCULOSKELETAL: Normal range of motion. EXT:  No edema and no tenderness. 2+ distal pulses.   Assessment and Plan:  Pregnancy: G4P3003 at [redacted]w[redacted]d by LMP  1. [redacted] weeks gestation of pregnancy   2. Supervision of other high risk pregnancy, antepartum Continue routine prenatal care Pt's BP currently normal, but she has a hx of CHTN, low threshold to medicate. Start baby ASA now, baseline PIH labs ordered. Pt interested in waterbirth, info given, but pt made aware if she has issues with HTN, she will no longer be a candidate.  Pt c/o back pain and desires flexeril.  Pt advised to try heating pad and belly  band first.  If symptoms are unresolved then she may obtain flexeril  - aspirin 81 MG chewable tablet; Chew 1 tablet (81 mg total) by mouth daily.  Dispense: 30 tablet; Refill: 8 - Comprehensive metabolic panel - Protein / creatinine ratio, urine - Hemoglobin A1c - CBC/D/Plt+RPR+Rh+ABO+RubIgG... - Cytology - PAP( [place]) - Culture, OB Urine - Cervicovaginal ancillary only( [place]) - [place] MFM OB DETAIL +14 WK; Future  3. Previous pregnancy with congenital heart defect, currently pregnant Anatomy scan ordered - US MFM OB DETAIL +14 WK; Future  4. Maternal morbid obesity, antepartum   5. 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 History of postpartum depression Monitor close to time of delivery for symptoms  7. BMI 45.0-49.9, adult   8. Hx of shoulder dystocia in prior pregnancy, currently pregnant Follow fetal growth this pregnancy   Preterm labor symptoms and general obstetric precautions including but not limited to vaginal bleeding, contractions, leaking of fluid and fetal movement were reviewed in detail with the patient.  Please refer to After Visit Summary for other counseling recommendations.   Return in about 4 weeks (around 07/23/2022) for [facility], in person.  Warden Fillers 06/25/2022 11:58 AM

## 2022-06-26 LAB — HCV INTERPRETATION

## 2022-06-26 LAB — CBC/D/PLT+RPR+RH+ABO+RUBIGG...
HCV Ab: NONREACTIVE
HIV Screen 4th Generation wRfx: NONREACTIVE
Hepatitis B Surface Ag: NEGATIVE
RPR Ser Ql: NONREACTIVE
Rubella Antibodies, IGG: 4.11 index (ref >0.99)

## 2022-06-26 LAB — COMPREHENSIVE METABOLIC PANEL
ALT: 12 IU/L (ref 0–32)
AST: 13 IU/L (ref 0–40)
Albumin/Globulin Ratio: 1.5 (ref 1.2–2.2)
Albumin: 4 g/dL (ref 4.0–5.0)
Alkaline Phosphatase: 111 IU/L (ref 44–121)
BUN/Creatinine Ratio: 14 (ref 9–23)
BUN: 9 mg/dL (ref 6–20)
Bilirubin Total: 0.3 mg/dL (ref 0.0–1.2)
CO2: 20 mmol/L (ref 20–29)
Calcium: 9.5 mg/dL (ref 8.7–10.2)
Chloride: 100 mmol/L (ref 96–106)
Creatinine, Ser: 0.64 mg/dL (ref 0.57–1.00)
Globulin, Total: 2.7 g/dL (ref 1.5–4.5)
Glucose: 85 mg/dL (ref 70–99)
Potassium: 3.9 mmol/L (ref 3.5–5.2)
Sodium: 137 mmol/L (ref 134–144)
Total Protein: 6.7 g/dL (ref 6.0–8.5)
eGFR: 123 mL/min/{1.73_m2} (ref >59)

## 2022-06-26 LAB — CERVICOVAGINAL ANCILLARY ONLY
Chlamydia: NEGATIVE
Comment: NEGATIVE
Comment: NORMAL
Neisseria Gonorrhea: NEGATIVE

## 2022-06-26 LAB — HEMOGLOBIN A1C

## 2022-06-26 NOTE — BH Specialist Note (Signed)
Integrated Behavioral Health Initial In-Person Visit  MRN: [id number] Name: TEDDY PENA  Number of Integrated Behavioral Health Clinician visits: 1 Session Start time:   1130am Session End time: 1217pm Total time in minutes: 47 mins in person at [facility]   Types of Service: General Behavioral Integrated Care (BHI)  Interpretor:No. Interpretor Name and Language: none   Warm Hand Off Completed.        Subjective: IYLA BALZARINI is a 27 y.o. female accompanied by Partner/Significant Other Patient was referred by Dr Donavan Foil for hx of postpartum depression. Patient reports the following symptoms/concerns: worry, hx of postpartum depression, irritability, and stress  Duration of problem: over one yea; Severity of problem: mild  Objective: Mood: good and Affect: Appropriate Risk of harm to self or others: No plan to harm self or others  Life Context: Family and Social: Lives with partner and children in [place] School/Work: n/a Self-Care: n/a Life Changes: New pregnancy  Patient and/or Family's Strengths/Protective Factors: Concrete supports in place (healthy food, safe environments, etc.)  Goals Addressed: Patient will: Reduce symptoms of: stress  Increase knowledge and/or ability of: stress reduction  Demonstrate ability to: Increase healthy adjustment to current life circumstances  Progress towards Goals: Ongoing  Interventions: Interventions utilized: Mindfulness or Relaxation Training and Supportive Counseling  Standardized Assessments completed: PHQ 9  Patient and/or Family Response: Ms. Borger reports history of postpartum depression with three prior pregnancies. Ms. Guerin reports worry regarding experiencing postpartum depression with current pregnancy.   Assessment: Patient has significant history of postpartum depression.   Patient may benefit from integrated behavioral health.  Plan: Follow up with behavioral health clinician on : 3 weeks   Behavioral recommendations: relaxation and mindfulness, commnicate needs for added support with partner, delegate task to prevent burnout Referral(s): Integrated [employer] (In Clinic) "From scale of 1-10, how likely are you to follow plan?":    Gwyndolyn Saxon, LCSW

## 2022-06-27 LAB — CULTURE, OB URINE

## 2022-06-27 LAB — URINE CULTURE, OB REFLEX

## 2022-06-28 LAB — CYTOLOGY - PAP
Comment: NEGATIVE
Diagnosis: NEGATIVE
High risk HPV: NEGATIVE

## 2022-06-29 LAB — COMPREHENSIVE METABOLIC PANEL
ALT: 11 IU/L (ref 0–32)
AST: 13 IU/L (ref 0–40)
Albumin/Globulin Ratio: 1.3 (ref 1.2–2.2)
Albumin: 3.9 g/dL — ABNORMAL LOW (ref 4.0–5.0)
Alkaline Phosphatase: 109 IU/L (ref 44–121)
BUN/Creatinine Ratio: 16 (ref 9–23)
BUN: 10 mg/dL (ref 6–20)
Bilirubin Total: 0.3 mg/dL (ref 0.0–1.2)
CO2: 20 mmol/L (ref 20–29)
Calcium: 9.1 mg/dL (ref 8.7–10.2)
Chloride: 98 mmol/L (ref 96–106)
Creatinine, Ser: 0.62 mg/dL (ref 0.57–1.00)
Globulin, Total: 3.1 g/dL (ref 1.5–4.5)
Glucose: 85 mg/dL (ref 70–99)
Potassium: 4 mmol/L (ref 3.5–5.2)
Sodium: 135 mmol/L (ref 134–144)
Total Protein: 7 g/dL (ref 6.0–8.5)
eGFR: 124 mL/min/{1.73_m2} (ref >59)

## 2022-06-29 LAB — HCV INTERPRETATION

## 2022-06-29 LAB — CBC/D/PLT+RPR+RH+ABO+RUBIGG...
Antibody Screen: NEGATIVE
Basophils Absolute: 0 10*3/uL (ref 0.0–0.2)
Basos: 0 %
EOS (ABSOLUTE): 0.1 10*3/uL (ref 0.0–0.4)
Eos: 1 %
HCV Ab: NONREACTIVE
HIV Screen 4th Generation wRfx: NONREACTIVE
Hematocrit: 36.3 % (ref 34.0–46.6)
Hemoglobin: 11.9 g/dL (ref 11.1–15.9)
Hepatitis B Surface Ag: NEGATIVE
Immature Grans (Abs): 0.1 10*3/uL (ref 0.0–0.1)
Immature Granulocytes: 1 %
Lymphocytes Absolute: 3.4 10*3/uL — ABNORMAL HIGH (ref 0.7–3.1)
Lymphs: 35 %
MCH: 26.2 pg — ABNORMAL LOW (ref 26.6–33.0)
MCHC: 32.8 g/dL (ref 31.5–35.7)
MCV: 80 fL (ref 79–97)
Monocytes Absolute: 0.7 10*3/uL (ref 0.1–0.9)
Monocytes: 7 %
Neutrophils Absolute: 5.3 10*3/uL (ref 1.4–7.0)
Neutrophils: 56 %
Platelets: 211 10*3/uL (ref 150–450)
RBC: 4.54 x10E6/uL (ref 3.77–5.28)
RDW: 17.1 % — ABNORMAL HIGH (ref 11.7–15.4)
RPR Ser Ql: NONREACTIVE
Rh Factor: POSITIVE
Rubella Antibodies, IGG: 4.27 index (ref >0.99)
WBC: 9.6 10*3/uL (ref 3.4–10.8)

## 2022-06-29 LAB — HEMOGLOBIN A1C
Est. average glucose Bld gHb Est-mCnc: 114 mg/dL
Hgb A1c MFr Bld: 5.6 % (ref 4.8–5.6)

## 2022-07-02 LAB — PANORAMA PRENATAL TEST FULL PANEL:PANORAMA TEST PLUS 5 ADDITIONAL MICRODELETIONS: FETAL FRACTION: 5.6

## 2022-07-07 LAB — PROTEIN / CREATININE RATIO, URINE

## 2022-07-11 LAB — PROTEIN / CREATININE RATIO, URINE

## 2022-07-24 ENCOUNTER — Ambulatory Visit (INDEPENDENT_AMBULATORY_CARE_PROVIDER_SITE_OTHER): Payer: Medicaid Other | Admitting: Certified Nurse Midwife

## 2022-07-24 VITALS — BP 135/79 | HR 100 | Wt 261.8 lb

## 2022-07-24 DIAGNOSIS — Z8759 Personal history of other complications of pregnancy, childbirth and the puerperium: Secondary | ICD-10-CM

## 2022-07-24 DIAGNOSIS — Z8679 Personal history of other diseases of the circulatory system: Secondary | ICD-10-CM

## 2022-07-24 DIAGNOSIS — R87612 Low grade squamous intraepithelial lesion on cytologic smear of cervix (LGSIL): Secondary | ICD-10-CM

## 2022-07-24 DIAGNOSIS — O0992 Supervision of high risk pregnancy, unspecified, second trimester: Secondary | ICD-10-CM

## 2022-07-24 DIAGNOSIS — O99891 Other specified diseases and conditions complicating pregnancy: Secondary | ICD-10-CM

## 2022-07-24 DIAGNOSIS — Z3A18 18 weeks gestation of pregnancy: Secondary | ICD-10-CM

## 2022-07-24 DIAGNOSIS — O09892 Supervision of other high risk pregnancies, second trimester: Secondary | ICD-10-CM

## 2022-07-24 DIAGNOSIS — O99210 Obesity complicating pregnancy, unspecified trimester: Secondary | ICD-10-CM

## 2022-07-24 DIAGNOSIS — Z8659 Personal history of other mental and behavioral disorders: Secondary | ICD-10-CM

## 2022-07-24 NOTE — Progress Notes (Signed)
PRENATAL VISIT NOTE  Subjective:  Leslie Skinner is a 27 y.o. [phone] at [redacted]w[redacted]d being seen today for ongoing prenatal care.  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  Patient reports no complaints.  Contractions: Not present. Vag. Bleeding: None.  Movement: Absent. Denies leaking of fluid.   The following portions of the patient's history were reviewed and updated as appropriate: allergies, current medications, past family history, past medical history, past social history, past surgical history and problem list.   Objective:   Vitals:   07/24/22 1115  BP: 135/79  Pulse: 100  Weight: 261 lb 12.8 oz (118.8 kg)    Fetal Status: Fetal Heart Rate (bpm): 146   Movement: Absent     General:  Alert, oriented and cooperative. Patient is in no acute distress.  Skin: Skin is warm and dry. No rash noted.   Cardiovascular: Normal heart rate noted  Respiratory: Normal respiratory effort, no problems with respiration noted  Abdomen: Soft, gravid, appropriate for gestational age.  Pain/Pressure: Absent     Pelvic: Cervical exam deferred        Extremities: Normal range of motion.  Edema: None   Mental Status: Normal mood and affect. Normal behavior. Normal judgment and thought content.   Assessment and Plan:  Pregnancy: G4P3003 at [redacted]w[redacted]d 1. Supervision of high risk pregnancy in second trimester - Patient doing well.  - Disappointed that she is not having a boy with this pregnancy but is happy about the pregnancy  2. History of chronic hypertension - No meds - On BB ASA  - BPs normal at today's visit.,  - Patient desires WB. Reviewed that ChTN rules out for WB but offered Hydrotherapy and patient desires that.   3. Short interval between pregnancies affecting pregnancy in second trimester, antepartum - Patient was unaware that she could get pregnant so soon after recently having a baby.  - Reviewed options for undesired fertility. Patient thinking about the patch, nexplanon or BTL. Briefly reviewed risk and benefits of either.  Patient to decide at at later visit.  - Patient has concerns continuing to breastfeed. CNM encouraged continued breastfeeding her 35 month old if she desires.   4. Obesity affecting pregnancy, antepartum, unspecified obesity type - Current BMI of 47.88 @ 18 weeks.   6. History of postpartum depression, currently pregnant in second trimester - Patient in good spirits today  - Currently seeing IBH   7. History of shoulder dystocia in prior pregnancy - Monitor fundal heights   8. [redacted] weeks gestation of pregnancy - [place] scheduled for 5/16   Preterm labor symptoms and general obstetric precautions including but not limited to vaginal bleeding, contractions, leaking of fluid and fetal movement were reviewed in detail with the patient. Please refer to After Visit Summary for other counseling recommendations.   Return in about 4 weeks (around 08/21/2022) for HROB.  Future Appointments  Date Time Provider Department Center  07/26/2022  9:15 AM [facility] NURSE [facility]  07/26/2022  9:30 AM [facility] US2 [facility]  08/21/2022 11:15 AM Carlynn Herald,  CNM [facility] None    [place] Yearby Danella Deis) Suzie Portela, MSN, CNM  Center for [facility]  07/24/22 1:05 PM

## 2022-07-24 NOTE — Progress Notes (Signed)
Patient presents for ROB visit. No concerns at this time.   

## 2022-07-25 ENCOUNTER — Encounter: Payer: Self-pay | Admitting: *Deleted

## 2022-07-26 ENCOUNTER — Ambulatory Visit: Payer: Medicaid Other | Attending: Obstetrics and Gynecology

## 2022-07-26 ENCOUNTER — Ambulatory Visit: Payer: Medicaid Other

## 2022-08-21 ENCOUNTER — Ambulatory Visit (INDEPENDENT_AMBULATORY_CARE_PROVIDER_SITE_OTHER): Payer: Medicaid Other | Admitting: Certified Nurse Midwife

## 2022-08-21 ENCOUNTER — Encounter: Payer: Self-pay | Admitting: Certified Nurse Midwife

## 2022-08-21 ENCOUNTER — Other Ambulatory Visit (HOSPITAL_COMMUNITY)
Admission: RE | Admit: 2022-08-21 | Discharge: 2022-08-21 | Disposition: A | Discharge status: Home / Self Care | Payer: Medicaid Other | Source: Ambulatory Visit | Attending: Certified Nurse Midwife | Admitting: Certified Nurse Midwife

## 2022-08-21 VITALS — BP 135/78 | HR 104 | Wt 263.8 lb

## 2022-08-21 DIAGNOSIS — Z8759 Personal history of other complications of pregnancy, childbirth and the puerperium: Secondary | ICD-10-CM

## 2022-08-21 DIAGNOSIS — Z3A22 22 weeks gestation of pregnancy: Secondary | ICD-10-CM

## 2022-08-21 DIAGNOSIS — B001 Herpesviral vesicular dermatitis: Secondary | ICD-10-CM

## 2022-08-21 DIAGNOSIS — N898 Other specified noninflammatory disorders of vagina: Secondary | ICD-10-CM

## 2022-08-21 DIAGNOSIS — O0992 Supervision of high risk pregnancy, unspecified, second trimester: Secondary | ICD-10-CM

## 2022-08-21 DIAGNOSIS — Z8679 Personal history of other diseases of the circulatory system: Secondary | ICD-10-CM

## 2022-08-21 NOTE — Progress Notes (Signed)
PRENATAL VISIT NOTE  Subjective:  Leslie Skinner is a 27 y.o. [phone] at [redacted]w[redacted]d being seen today for ongoing prenatal care.  She is currently monitored for the following issues for this high-risk pregnancy and has Chronic hypertension during pregnancy, antepartum; Obesity, Class III, BMI 40-49.9 (morbid obesity) (HCC); Supervision of high risk pregnancy, antepartum; Maternal morbid obesity, antepartum (HCC); Previous pregnancy with congenital heart defect, currently pregnant; Chronic hypertension affecting pregnancy; Supervision of other high risk pregnancy, antepartum; BMI 45.0-49.9, adult (HCC); and Hx of shoulder dystocia in prior pregnancy, currently pregnant on their problem list.  Patient reports  recurring cold sores in pregnancy. Patient states this happens in each pregnancy. She also reports new onset of vaginal itching. She states that she has scratched so much that it burns now. She denies vaginal odors or discharge. She also reports "sniffing sharpies and fabuloso cleaning solution and mixing pinesol, bleach, and squeezing it through a sponge."  .  Contractions: Not present. Vag. Bleeding: None.  Movement: Present. Denies leaking of fluid.   The following portions of the patient's history were reviewed and updated as appropriate: allergies, current medications, past family history, past medical history, past social history, past surgical history and problem list.   Objective:   Vitals:   08/21/22 1149 08/21/22 1153  BP: (!) 144/85 135/78  Pulse: (!) 103 (!) 104  Weight: 263 lb 12.8 oz (119.7 kg)     Fetal Status: Fetal Heart Rate (bpm): 135   Movement: Present     General:  Alert, oriented and cooperative. Patient is in no acute distress.  Skin: Skin is warm and dry. No rash noted.   Cardiovascular: Normal heart rate noted  Respiratory: Normal respiratory effort, no problems with respiration noted  Abdomen: Soft, gravid, appropriate for gestational age.  Pain/Pressure:  Absent     Pelvic: Cervical exam deferred        Extremities: Normal range of motion.  Edema: None  Mental Status: Normal mood and affect. Normal behavior. Normal judgment and thought content.   Assessment and Plan:  Pregnancy: G4P3003 at [redacted]w[redacted]d 1. Supervision of high risk pregnancy in second trimester - Patient overall doing well.  - Reports frequent and vigorous fetal movement.   2. History of chronic hypertension - Initial BP elevated today, but recheck normal   3. History of shoulder dystocia in prior pregnancy - [place] to be monitored starting at next visit.   4. [redacted] weeks gestation of pregnancy - Patient missed anatomy US.  - Recommended to call MFM to be rescheduled. Patient verbalized understanding.  - Recommended that patient use Fabuloso on the stove to smell up the house, versus sniffing out the bottle. Also recommended to find another tactile stimulant, instead of mixing chemicals and squeezing it in the sink.  - Patient verbalized understanding.     5. Recurrent cold sores - Reviewed OTC options that may help relieve symptoms.   6. Itching in the vaginal area - Wet prep collected today.  - WET PREP FOR TRICH, YEAST, CLUE  Preterm labor symptoms and general obstetric precautions including but not limited to vaginal bleeding, contractions, leaking of fluid and fetal movement were reviewed in detail with the patient. Please refer to After Visit Summary for other counseling recommendations.   Return in about 4 weeks (around 09/18/2022) for LOB.  Future Appointments  Date Time Provider Department Center  09/19/2022  9:15 AM [facility] LAB [facility] None  09/19/2022  9:55 AM Corlis Hove, NP [facility] None  Damontay Alred Danella Deis) Suzie Portela, MSN, CNM  Center for [facility]  08/21/2022 1:47 PM

## 2022-08-21 NOTE — Addendum Note (Signed)
Addended by: Jearld Adjutant on: 08/21/2022 04:22 PM   Modules accepted: Orders

## 2022-08-22 LAB — CERVICOVAGINAL ANCILLARY ONLY
Bacterial Vaginitis (gardnerella): POSITIVE — AB
Candida Glabrata: NEGATIVE
Candida Vaginitis: POSITIVE — AB
Chlamydia: NEGATIVE
Comment: NEGATIVE
Comment: NEGATIVE
Comment: NEGATIVE
Comment: NEGATIVE
Comment: NEGATIVE
Comment: NORMAL
Neisseria Gonorrhea: NEGATIVE
Trichomonas: NEGATIVE

## 2022-08-22 MED ORDER — METRONIDAZOLE 500 MG PO TABS: 500.0000 mg | ORAL_TABLET | Freq: Two times a day (BID) | ORAL | 0 refills | Status: DC

## 2022-08-22 MED ORDER — TERCONAZOLE 0.4 % VA CREA: 1.0000 | TOPICAL_CREAM | Freq: Every day | VAGINAL | 0 refills | Status: DC

## 2022-08-22 NOTE — Addendum Note (Signed)
Addended by: Carlynn Herald on: 08/22/2022 04:23 PM   Modules accepted: Orders

## 2022-09-19 ENCOUNTER — Other Ambulatory Visit (HOSPITAL_COMMUNITY): Admission: RE | Admit: 2022-09-19 | Payer: Medicaid Other | Source: Ambulatory Visit

## 2022-09-19 ENCOUNTER — Other Ambulatory Visit: Payer: Medicaid Other

## 2022-09-19 ENCOUNTER — Encounter: Payer: Self-pay | Admitting: Student

## 2022-09-19 ENCOUNTER — Ambulatory Visit: Payer: Medicaid Other | Admitting: Student

## 2022-09-19 VITALS — BP 132/76 | HR 99 | Wt 264.4 lb

## 2022-09-19 DIAGNOSIS — N898 Other specified noninflammatory disorders of vagina: Secondary | ICD-10-CM

## 2022-09-19 DIAGNOSIS — O99891 Other specified diseases and conditions complicating pregnancy: Secondary | ICD-10-CM

## 2022-09-19 DIAGNOSIS — Z3A27 27 weeks gestation of pregnancy: Secondary | ICD-10-CM | POA: Insufficient documentation

## 2022-09-19 DIAGNOSIS — M545 Low back pain, unspecified: Secondary | ICD-10-CM

## 2022-09-19 DIAGNOSIS — Z8679 Personal history of other diseases of the circulatory system: Secondary | ICD-10-CM

## 2022-09-19 DIAGNOSIS — Z8759 Personal history of other complications of pregnancy, childbirth and the puerperium: Secondary | ICD-10-CM

## 2022-09-19 DIAGNOSIS — R220 Localized swelling, mass and lump, head: Secondary | ICD-10-CM

## 2022-09-19 DIAGNOSIS — O0992 Supervision of high risk pregnancy, unspecified, second trimester: Secondary | ICD-10-CM | POA: Insufficient documentation

## 2022-09-19 DIAGNOSIS — O09892 Supervision of other high risk pregnancies, second trimester: Secondary | ICD-10-CM

## 2022-09-19 DIAGNOSIS — O99210 Obesity complicating pregnancy, unspecified trimester: Secondary | ICD-10-CM

## 2022-09-19 DIAGNOSIS — Z8659 Personal history of other mental and behavioral disorders: Secondary | ICD-10-CM

## 2022-09-19 DIAGNOSIS — G8929 Other chronic pain: Secondary | ICD-10-CM

## 2022-09-19 NOTE — Progress Notes (Signed)
PRENATAL VISIT NOTE  Subjective:  Leslie Skinner is a 27 y.o. [phone] at [redacted]w[redacted]d being seen today for ongoing prenatal care.  She is currently monitored for the following issues for this high-risk pregnancy and has Chronic hypertension during pregnancy, antepartum; Obesity, Class III, BMI 40-49.9 (morbid obesity) (HCC); Supervision of high risk pregnancy, antepartum; Maternal morbid obesity, antepartum (HCC); Previous pregnancy with congenital heart defect, currently pregnant; Chronic hypertension affecting pregnancy; Supervision of other high risk pregnancy, antepartum; BMI 45.0-49.9, adult (HCC); and Hx of shoulder dystocia in prior pregnancy, currently pregnant on their problem list.  Patient reports backache and vaginal irritation.  Contractions: Not present. Vag. Bleeding: None.  Movement: Present. Denies leaking of fluid.   The following portions of the patient's history were reviewed and updated as appropriate: allergies, current medications, past family history, past medical history, past social history, past surgical history and problem list.   Objective:   Vitals:   09/19/22 0951  BP: 132/76  Pulse: 99  Weight: 264 lb 6.4 oz (119.9 kg)    Fetal Status: Fetal Heart Rate (bpm): 146   Movement: Present     General:  Alert, oriented and cooperative. Patient is in no acute distress.  Skin: Skin is warm and dry. No rash noted.   Cardiovascular: Normal heart rate noted  Respiratory: Normal respiratory effort, no problems with respiration noted  Abdomen: Soft, gravid, appropriate for gestational age.  Pain/Pressure: Absent     Pelvic: Cervical exam deferred        Extremities: Normal range of motion.  Edema: None  Mental Status: Normal mood and affect. Normal behavior. Normal judgment and thought content.   Assessment and Plan:  Pregnancy: G4P3003 at [redacted]w[redacted]d  1. Supervision of high risk pregnancy in second trimester - Frequent and vigorous fetal movement  - HIV antibody (with  reflex) - RPR - Glucose Tolerance, 2 Hours w/1 Hour - Tdap vaccine greater than or equal to 7yo IM - Protein / creatinine ratio, urine - Cervicovaginal ancillary only - Comprehensive metabolic panel  2. [redacted] weeks gestation of pregnancy - HIV antibody (with reflex) - RPR - CBC - Glucose Tolerance, 2 Hours w/1 Hour - Tdap vaccine greater than or equal to 7yo IM - Protein / creatinine ratio, urine - Comprehensive metabolic panel  3. Obesity affecting pregnancy, antepartum, unspecified obesity type - BMI : 48.4 - anatomy scan is scheduled and upcoming  4. Short interval between pregnancies affecting pregnancy in second trimester, antepartum -- anatomy scan is scheduled and upcoming  5. History of chronic hypertension - stable blood pressures - ASA therapy  - base line pre-e labs collected due to new onset of facial swelling - anatomy scan is scheduled and upcoming  6. History of shoulder dystocia in prior pregnancy   7. History of postpartum depression, currently pregnant in second trimester - Patient interested in therapy - Amb ref to Integrated Behavioral Health  8. Facial swelling - Encouraged increased hydration - Protein / creatinine ratio, urine - Comprehensive metabolic panel - CBC  9. Vaginal irritation - miconazole (MONISTAT 7) 2 % vaginal cream; Place 1 Applicatorful vaginally at bedtime. Apply for seven nights  Dispense: 30 g; Refill: 2 - Cervicovaginal ancillary only  10. Chronic bilateral low back pain, unspecified whether sciatica present - cyclobenzaprine (FLEXERIL) 5 MG tablet; Take 1 tablet (5 mg total) by mouth 3 (three) times daily as needed for muscle spasms.  Dispense: 20 tablet; Refill: 0   Preterm labor symptoms and general obstetric precautions including  but not limited to vaginal bleeding, contractions, leaking of fluid and fetal movement were reviewed in detail with the patient. Please refer to After Visit Summary for other counseling  recommendations.   No follow-ups on file.  Future Appointments  Date Time Provider Department Center  10/01/2022  2:15 PM [facility] NURSE [facility]  10/01/2022  2:30 PM [facility] US2 [facility]    Corlis Hove, NP

## 2022-09-20 LAB — COMPREHENSIVE METABOLIC PANEL
ALT: 11 IU/L (ref 0–32)
AST: 13 IU/L (ref 0–40)
Albumin: 3.5 g/dL — ABNORMAL LOW (ref 4.0–5.0)
Alkaline Phosphatase: 145 IU/L — ABNORMAL HIGH (ref 44–121)
BUN/Creatinine Ratio: 11 (ref 9–23)
BUN: 8 mg/dL (ref 6–20)
Bilirubin Total: 0.3 mg/dL (ref 0.0–1.2)
CO2: 21 mmol/L (ref 20–29)
Calcium: 9.3 mg/dL (ref 8.7–10.2)
Chloride: 100 mmol/L (ref 96–106)
Creatinine, Ser: 0.75 mg/dL (ref 0.57–1.00)
Globulin, Total: 3.2 g/dL (ref 1.5–4.5)
Glucose: 120 mg/dL — ABNORMAL HIGH (ref 70–99)
Potassium: 3.9 mmol/L (ref 3.5–5.2)
Sodium: 136 mmol/L (ref 134–144)
Total Protein: 6.7 g/dL (ref 6.0–8.5)
eGFR: 111 mL/min/{1.73_m2} (ref >59)

## 2022-09-20 LAB — HIV ANTIBODY (ROUTINE TESTING W REFLEX): HIV Screen 4th Generation wRfx: NONREACTIVE

## 2022-09-20 LAB — CBC
Hematocrit: 33.8 % — ABNORMAL LOW (ref 34.0–46.6)
Hemoglobin: 10.6 g/dL — ABNORMAL LOW (ref 11.1–15.9)
MCH: 24.8 pg — ABNORMAL LOW (ref 26.6–33.0)
MCHC: 31.4 g/dL — ABNORMAL LOW (ref 31.5–35.7)
MCV: 79 fL (ref 79–97)
Platelets: 209 10*3/uL (ref 150–450)
RBC: 4.27 x10E6/uL (ref 3.77–5.28)
RDW: 16 % — ABNORMAL HIGH (ref 11.7–15.4)
WBC: 10.9 10*3/uL — ABNORMAL HIGH (ref 3.4–10.8)

## 2022-09-20 LAB — GLUCOSE TOLERANCE, 2 HOURS W/ 1HR
Glucose, 1 hour: 157 mg/dL (ref 70–179)
Glucose, 2 hour: 116 mg/dL (ref 70–152)
Glucose, Fasting: 96 mg/dL — ABNORMAL HIGH (ref 70–91)

## 2022-09-20 LAB — CERVICOVAGINAL ANCILLARY ONLY
Bacterial Vaginitis (gardnerella): NEGATIVE
Candida Glabrata: NEGATIVE
Candida Vaginitis: POSITIVE — AB
Comment: NEGATIVE
Comment: NEGATIVE
Comment: NEGATIVE

## 2022-09-20 LAB — PROTEIN / CREATININE RATIO, URINE
Creatinine, Urine: 206.6 mg/dL
Protein, Ur: 26.9 mg/dL
Protein/Creat Ratio: 130 mg/g creat (ref 0–200)

## 2022-09-20 LAB — RPR: RPR Ser Ql: NONREACTIVE

## 2022-09-23 MED ORDER — CYCLOBENZAPRINE HCL 5 MG PO TABS: 5.0000 mg | ORAL_TABLET | Freq: Three times a day (TID) | ORAL | 0 refills | Status: DC | PRN

## 2022-09-23 MED ORDER — MICONAZOLE NITRATE 2 % VA CREA
1.0000 | TOPICAL_CREAM | Freq: Every day | VAGINAL | 2 refills | Status: DC
Start: 2022-09-23 — End: 2022-10-18

## 2022-09-25 ENCOUNTER — Encounter: Payer: Self-pay | Admitting: Student

## 2022-09-25 DIAGNOSIS — O24419 Gestational diabetes mellitus in pregnancy, unspecified control: Secondary | ICD-10-CM | POA: Insufficient documentation

## 2022-09-26 ENCOUNTER — Other Ambulatory Visit: Payer: Self-pay

## 2022-09-26 ENCOUNTER — Telehealth: Payer: Self-pay

## 2022-09-26 DIAGNOSIS — O24419 Gestational diabetes mellitus in pregnancy, unspecified control: Secondary | ICD-10-CM

## 2022-09-26 MED ORDER — ACCU-CHEK SOFTCLIX LANCETS MISC
12 refills | Status: AC
Start: 2022-09-26 — End: ?

## 2022-09-26 MED ORDER — ACCU-CHEK GUIDE VI STRP
ORAL_STRIP | 12 refills | Status: AC
Start: 2022-09-26 — End: ?

## 2022-09-26 MED ORDER — ACCU-CHEK GUIDE W/DEVICE KIT
1.0000 | PACK | Freq: Four times a day (QID) | 0 refills | Status: AC
Start: 2022-09-26 — End: ?

## 2022-09-26 NOTE — Telephone Encounter (Signed)
Spoke with patient and verified I am speaking with the correct patient using name and date of birth.   Informed patient of failed 2 hr GTT. Informed patient supplies had been sent in and a referral had been placed to nutrition and diabetes educator. Patient is familiar with this process, states she did this was a prior pregnancy.   Patient voices understanding and has no questions or concerns at this time.

## 2022-09-26 NOTE — Telephone Encounter (Signed)
-----   Message from Corlis Hove sent at 09/25/2022  9:32 AM EDT ----- Hi- Patient will need GDM supplies and to see GDM counselor/nutritionist. Can we help set all of this up for her please? Thanks!

## 2022-10-01 ENCOUNTER — Ambulatory Visit: Payer: Medicaid Other | Admitting: *Deleted

## 2022-10-01 ENCOUNTER — Ambulatory Visit: Payer: Medicaid Other | Attending: Obstetrics and Gynecology

## 2022-10-01 VITALS — BP 120/69 | HR 105

## 2022-10-01 DIAGNOSIS — O09299 Supervision of pregnancy with other poor reproductive or obstetric history, unspecified trimester: Secondary | ICD-10-CM | POA: Diagnosis present

## 2022-10-01 DIAGNOSIS — O99213 Obesity complicating pregnancy, third trimester: Secondary | ICD-10-CM | POA: Diagnosis not present

## 2022-10-01 DIAGNOSIS — O24419 Gestational diabetes mellitus in pregnancy, unspecified control: Secondary | ICD-10-CM | POA: Diagnosis present

## 2022-10-01 DIAGNOSIS — O09899 Supervision of other high risk pregnancies, unspecified trimester: Secondary | ICD-10-CM

## 2022-10-01 DIAGNOSIS — O09293 Supervision of pregnancy with other poor reproductive or obstetric history, third trimester: Secondary | ICD-10-CM

## 2022-10-01 DIAGNOSIS — Z3A28 28 weeks gestation of pregnancy: Secondary | ICD-10-CM

## 2022-10-01 DIAGNOSIS — O10013 Pre-existing essential hypertension complicating pregnancy, third trimester: Secondary | ICD-10-CM | POA: Diagnosis not present

## 2022-10-01 DIAGNOSIS — E669 Obesity, unspecified: Secondary | ICD-10-CM

## 2022-10-04 ENCOUNTER — Institutional Professional Consult (permissible substitution): Payer: Medicaid Other | Admitting: Licensed Clinical Social Worker

## 2022-10-04 ENCOUNTER — Encounter: Payer: Medicaid Other | Admitting: Obstetrics and Gynecology

## 2022-10-09 ENCOUNTER — Encounter: Payer: Medicaid Other | Admitting: Obstetrics and Gynecology

## 2022-10-09 ENCOUNTER — Other Ambulatory Visit: Payer: Self-pay | Admitting: *Deleted

## 2022-10-09 DIAGNOSIS — O10919 Unspecified pre-existing hypertension complicating pregnancy, unspecified trimester: Secondary | ICD-10-CM

## 2022-10-09 DIAGNOSIS — O99213 Obesity complicating pregnancy, third trimester: Secondary | ICD-10-CM

## 2022-10-10 ENCOUNTER — Ambulatory Visit: Payer: Medicaid Other

## 2022-10-18 ENCOUNTER — Ambulatory Visit (INDEPENDENT_AMBULATORY_CARE_PROVIDER_SITE_OTHER): Payer: Medicaid Other | Admitting: Family Medicine

## 2022-10-18 ENCOUNTER — Encounter: Payer: Self-pay | Admitting: Family Medicine

## 2022-10-18 ENCOUNTER — Other Ambulatory Visit (HOSPITAL_COMMUNITY)
Admission: RE | Admit: 2022-10-18 | Discharge: 2022-10-18 | Disposition: A | Discharge status: Home / Self Care | Payer: Medicaid Other | Source: Ambulatory Visit | Attending: Family Medicine | Admitting: Family Medicine

## 2022-10-18 VITALS — BP 132/83 | HR 95 | Wt 268.8 lb

## 2022-10-18 DIAGNOSIS — G8929 Other chronic pain: Secondary | ICD-10-CM

## 2022-10-18 DIAGNOSIS — O10913 Unspecified pre-existing hypertension complicating pregnancy, third trimester: Secondary | ICD-10-CM

## 2022-10-18 DIAGNOSIS — O0990 Supervision of high risk pregnancy, unspecified, unspecified trimester: Secondary | ICD-10-CM

## 2022-10-18 DIAGNOSIS — N898 Other specified noninflammatory disorders of vagina: Secondary | ICD-10-CM | POA: Diagnosis present

## 2022-10-18 DIAGNOSIS — O09299 Supervision of pregnancy with other poor reproductive or obstetric history, unspecified trimester: Secondary | ICD-10-CM

## 2022-10-18 DIAGNOSIS — O10919 Unspecified pre-existing hypertension complicating pregnancy, unspecified trimester: Secondary | ICD-10-CM

## 2022-10-18 DIAGNOSIS — Z3A31 31 weeks gestation of pregnancy: Secondary | ICD-10-CM

## 2022-10-18 DIAGNOSIS — M545 Low back pain, unspecified: Secondary | ICD-10-CM

## 2022-10-18 DIAGNOSIS — Z6841 Body Mass Index (BMI) 40.0 and over, adult: Secondary | ICD-10-CM

## 2022-10-18 DIAGNOSIS — O24419 Gestational diabetes mellitus in pregnancy, unspecified control: Secondary | ICD-10-CM

## 2022-10-18 DIAGNOSIS — O09293 Supervision of pregnancy with other poor reproductive or obstetric history, third trimester: Secondary | ICD-10-CM

## 2022-10-18 MED ORDER — PEN NEEDLES 32G X 4 MM MISC
3 refills | Status: AC
Start: 2022-10-18 — End: ?

## 2022-10-18 MED ORDER — INSULIN GLARGINE 100 UNITS/ML SOLOSTAR PEN
15.0000 [IU] | PEN_INJECTOR | Freq: Two times a day (BID) | SUBCUTANEOUS | 2 refills | Status: AC
Start: 2022-10-18 — End: ?

## 2022-10-18 MED ORDER — CYCLOBENZAPRINE HCL 5 MG PO TABS
5.0000 mg | ORAL_TABLET | Freq: Three times a day (TID) | ORAL | 1 refills | Status: AC | PRN
Start: 2022-10-18 — End: ?

## 2022-10-18 NOTE — Progress Notes (Signed)
Pt presents for ROB visit. Pt missed GDM ED class. Report normal CBGs at home. No concerns

## 2022-10-18 NOTE — Progress Notes (Signed)
   PRENATAL VISIT NOTE  Subjective:  Leslie Skinner is a 27 y.o. [phone] at [redacted]w[redacted]d being seen today for ongoing prenatal care.  She is currently monitored for the following issues for this high-risk pregnancy and has Chronic hypertension during pregnancy, antepartum; Obesity, Class III, BMI 40-49.9 (morbid obesity) (HCC); Supervision of high risk pregnancy, antepartum; Maternal morbid obesity, antepartum (HCC); Previous pregnancy with congenital heart defect, currently pregnant; Chronic hypertension affecting pregnancy; Supervision of other high risk pregnancy, antepartum; BMI 45.0-49.9, adult (HCC); Hx of shoulder dystocia in prior pregnancy, currently pregnant; and Gestational diabetes mellitus (GDM) affecting pregnancy on their problem list.  Patient reports vaginal irritation.  Contractions: Not present. Vag. Bleeding: None.  Movement: Present. Denies leaking of fluid.   The following portions of the patient's history were reviewed and updated as appropriate: allergies, current medications, past family history, past medical history, past social history, past surgical history and problem list.   Objective:   Vitals:   10/18/22 1053  BP: 132/83  Pulse: 95  Weight: 268 lb 12.8 oz (121.9 kg)    Fetal Status: Fetal Heart Rate (bpm): 145   Movement: Present     General:  Alert, oriented and cooperative. Patient is in no acute distress.  Skin: Skin is warm and dry. No rash noted.   Cardiovascular: Normal heart rate noted  Respiratory: Normal respiratory effort, no problems with respiration noted  Abdomen: Soft, gravid, appropriate for gestational age.  Pain/Pressure: Absent     Pelvic: Cervical exam deferred        Extremities: Normal range of motion.  Edema: None  Mental Status: Normal mood and affect. Normal behavior. Normal judgment and thought content.   Assessment and Plan:  Pregnancy: G4P3003 at [redacted]w[redacted]d 1. Supervision of high risk pregnancy, antepartum Doing ok. Swab collected d/t  vaginal irritation.  2. Chronic hypertension affecting pregnancy Blood pressures normal. Taking ldASA  3. Gestational diabetes mellitus (GDM) affecting pregnancy No blood sugar logs, however, reports fasting blood sugars in the 130s.  Yet to see diabetes educator. Discussed need for medications.  Will start insulin at 15 units BID. May still require increased dose with time. - emphasized importance of follow up with diabetes coordinator  4. BMI 45.0-49.9, adult (HCC)   5. [redacted] weeks gestation of pregnancy   6. Hx of shoulder dystocia in prior pregnancy, currently pregnant   Preterm labor symptoms and general obstetric precautions including but not limited to vaginal bleeding, contractions, leaking of fluid and fetal movement were reviewed in detail with the patient. Please refer to After Visit Summary for other counseling recommendations.   Return in about 2 weeks (around 11/01/2022) for [facility].  Future Appointments  Date Time Provider Department Center  10/23/2022  2:15 PM [facility] NURSE [facility]  10/23/2022  2:30 PM [facility] US3 [facility]  10/30/2022 10:30 AM [facility] NURSE [facility]  10/30/2022 10:45 AM [facility] US6 [facility]  11/01/2022 11:15 AM Hermina Staggers, MD [facility] None  11/06/2022  9:45 AM [facility] NURSE [facility]  11/06/2022 10:00 AM [facility] US1 [facility]  11/15/2022 10:35 AM Warden Fillers, MD [facility] None  11/22/2022 10:35 AM Corlis Hove, NP [facility] None  11/29/2022 10:35 AM Hermina Staggers, MD [facility] None  12/06/2022 10:55 AM Adam Phenix, MD [facility] None  12/13/2022 10:55 AM Raelyn Mora, CNM [facility] None   Sheppard Evens MD MPH OB Fellow, Faculty [facility], Center for [facility] 10/18/2022

## 2022-10-19 ENCOUNTER — Other Ambulatory Visit: Payer: Self-pay | Admitting: Family Medicine

## 2022-10-19 DIAGNOSIS — B3731 Acute candidiasis of vulva and vagina: Secondary | ICD-10-CM

## 2022-10-19 MED ORDER — FLUCONAZOLE 150 MG PO TABS
150.0000 mg | ORAL_TABLET | Freq: Once | ORAL | 0 refills | Status: AC
Start: 2022-10-19 — End: 2022-10-19

## 2022-10-23 ENCOUNTER — Ambulatory Visit: Payer: Medicaid Other | Attending: Obstetrics

## 2022-10-23 ENCOUNTER — Encounter: Payer: Self-pay | Admitting: *Deleted

## 2022-10-23 ENCOUNTER — Ambulatory Visit: Payer: Medicaid Other | Admitting: *Deleted

## 2022-10-23 ENCOUNTER — Other Ambulatory Visit: Payer: Self-pay | Admitting: *Deleted

## 2022-10-23 VITALS — BP 129/72 | HR 113

## 2022-10-23 DIAGNOSIS — O10919 Unspecified pre-existing hypertension complicating pregnancy, unspecified trimester: Secondary | ICD-10-CM | POA: Insufficient documentation

## 2022-10-23 DIAGNOSIS — Z3A31 31 weeks gestation of pregnancy: Secondary | ICD-10-CM

## 2022-10-23 DIAGNOSIS — O09899 Supervision of other high risk pregnancies, unspecified trimester: Secondary | ICD-10-CM | POA: Insufficient documentation

## 2022-10-23 DIAGNOSIS — O24414 Gestational diabetes mellitus in pregnancy, insulin controlled: Secondary | ICD-10-CM | POA: Diagnosis not present

## 2022-10-23 DIAGNOSIS — O24419 Gestational diabetes mellitus in pregnancy, unspecified control: Secondary | ICD-10-CM | POA: Insufficient documentation

## 2022-10-23 DIAGNOSIS — O99213 Obesity complicating pregnancy, third trimester: Secondary | ICD-10-CM | POA: Diagnosis not present

## 2022-10-23 DIAGNOSIS — O09293 Supervision of pregnancy with other poor reproductive or obstetric history, third trimester: Secondary | ICD-10-CM | POA: Diagnosis not present

## 2022-10-23 DIAGNOSIS — E669 Obesity, unspecified: Secondary | ICD-10-CM

## 2022-10-23 DIAGNOSIS — O10013 Pre-existing essential hypertension complicating pregnancy, third trimester: Secondary | ICD-10-CM

## 2022-10-30 ENCOUNTER — Ambulatory Visit: Payer: Medicaid Other | Admitting: *Deleted

## 2022-10-30 ENCOUNTER — Ambulatory Visit: Payer: Medicaid Other | Attending: Obstetrics

## 2022-10-30 ENCOUNTER — Other Ambulatory Visit: Payer: Self-pay | Admitting: *Deleted

## 2022-10-30 VITALS — BP 125/74 | HR 97

## 2022-10-30 DIAGNOSIS — O24414 Gestational diabetes mellitus in pregnancy, insulin controlled: Secondary | ICD-10-CM

## 2022-10-30 DIAGNOSIS — O99213 Obesity complicating pregnancy, third trimester: Secondary | ICD-10-CM | POA: Diagnosis not present

## 2022-10-30 DIAGNOSIS — Z3A32 32 weeks gestation of pregnancy: Secondary | ICD-10-CM

## 2022-10-30 DIAGNOSIS — O24419 Gestational diabetes mellitus in pregnancy, unspecified control: Secondary | ICD-10-CM | POA: Insufficient documentation

## 2022-10-30 DIAGNOSIS — O09293 Supervision of pregnancy with other poor reproductive or obstetric history, third trimester: Secondary | ICD-10-CM | POA: Diagnosis not present

## 2022-10-30 DIAGNOSIS — O09899 Supervision of other high risk pregnancies, unspecified trimester: Secondary | ICD-10-CM | POA: Insufficient documentation

## 2022-10-30 DIAGNOSIS — O10919 Unspecified pre-existing hypertension complicating pregnancy, unspecified trimester: Secondary | ICD-10-CM

## 2022-10-30 DIAGNOSIS — Z8279 Family history of other congenital malformations, deformations and chromosomal abnormalities: Secondary | ICD-10-CM

## 2022-10-30 DIAGNOSIS — E669 Obesity, unspecified: Secondary | ICD-10-CM

## 2022-10-30 DIAGNOSIS — O10013 Pre-existing essential hypertension complicating pregnancy, third trimester: Secondary | ICD-10-CM

## 2022-11-01 ENCOUNTER — Institutional Professional Consult (permissible substitution): Payer: Medicaid Other | Admitting: Licensed Clinical Social Worker

## 2022-11-01 ENCOUNTER — Encounter: Payer: Medicaid Other | Admitting: Obstetrics and Gynecology

## 2022-11-04 ENCOUNTER — Telehealth: Payer: Medicaid Other | Admitting: Nurse Practitioner

## 2022-11-04 DIAGNOSIS — B354 Tinea corporis: Secondary | ICD-10-CM

## 2022-11-05 MED ORDER — TERBINAFINE HCL 1 % EX CREA
1.0000 | TOPICAL_CREAM | Freq: Every day | CUTANEOUS | 0 refills | Status: AC
Start: 2022-11-05 — End: ?

## 2022-11-05 NOTE — Progress Notes (Signed)
E Visit for Rash  We are sorry that you are not feeling well. Here is how we plan to help!   Based upon your presentation it appears you have a fungal infection.  I have prescribed: Meds ordered this encounter  Medications   terbinafine (LAMISIL) 1 % cream    Sig: Apply 1 Application topically daily.    Dispense:  30 g    Refill:  0     HOME CARE:  Take cool showers and avoid direct sunlight. Take a bath in an oatmeal bath.  Sprinkle content of one Aveeno packet under running faucet with comfortably warm water.  Bathe for 15-20 minutes, 1-2 times daily.  Pat dry with a towel. Do not rub the rash. Wash with mild soaps, avoid soaps that are scented or with dye  GET HELP RIGHT AWAY IF:  Symptoms don't go away after treatment. Severe itching that persists. If you rash spreads or swells. If you rash begins to smell. If it blisters and opens or develops a yellow-brown crust. You develop a fever. You have a sore throat. You become short of breath.  MAKE SURE YOU:  Understand these instructions. Will watch your condition. Will get help right away if you are not doing well or get worse.  Thank you for choosing an e-visit.  Your e-visit answers were reviewed by a board certified advanced clinical practitioner to complete your personal care plan. Depending upon the condition, your plan could have included both over the counter or prescription medications.  Please review your pharmacy choice. Make sure the pharmacy is open so you can pick up prescription now. If there is a problem, you may contact your provider through [employer] and have the prescription routed to another pharmacy.  Your safety is important to [place]. If you have drug allergies check your prescription carefully.   For the next 24 hours you can use MyChart to ask questions about today's visit, request a non-urgent call back, or ask for a work or school excuse. You will get an email in the next two days asking about  your experience. I hope that your e-visit has been valuable and will speed your recovery.   I spent approximately 5 minutes reviewing the patient's history, current symptoms and coordinating their care today.

## 2022-11-06 ENCOUNTER — Ambulatory Visit: Payer: Medicaid Other | Attending: Obstetrics

## 2022-11-06 ENCOUNTER — Ambulatory Visit: Payer: Medicaid Other | Admitting: *Deleted

## 2022-11-06 VITALS — BP 133/71 | HR 91

## 2022-11-06 DIAGNOSIS — E669 Obesity, unspecified: Secondary | ICD-10-CM

## 2022-11-06 DIAGNOSIS — O24419 Gestational diabetes mellitus in pregnancy, unspecified control: Secondary | ICD-10-CM | POA: Diagnosis present

## 2022-11-06 DIAGNOSIS — O99213 Obesity complicating pregnancy, third trimester: Secondary | ICD-10-CM | POA: Diagnosis not present

## 2022-11-06 DIAGNOSIS — Z3A33 33 weeks gestation of pregnancy: Secondary | ICD-10-CM

## 2022-11-06 DIAGNOSIS — O09899 Supervision of other high risk pregnancies, unspecified trimester: Secondary | ICD-10-CM

## 2022-11-06 DIAGNOSIS — O09293 Supervision of pregnancy with other poor reproductive or obstetric history, third trimester: Secondary | ICD-10-CM

## 2022-11-06 DIAGNOSIS — O10013 Pre-existing essential hypertension complicating pregnancy, third trimester: Secondary | ICD-10-CM

## 2022-11-06 DIAGNOSIS — O10919 Unspecified pre-existing hypertension complicating pregnancy, unspecified trimester: Secondary | ICD-10-CM | POA: Diagnosis present

## 2022-11-06 DIAGNOSIS — O24414 Gestational diabetes mellitus in pregnancy, insulin controlled: Secondary | ICD-10-CM

## 2022-11-13 ENCOUNTER — Other Ambulatory Visit: Payer: Medicaid Other

## 2022-11-13 ENCOUNTER — Ambulatory Visit: Payer: Medicaid Other

## 2022-11-13 ENCOUNTER — Ambulatory Visit: Payer: Medicaid Other | Attending: Obstetrics and Gynecology | Admitting: *Deleted

## 2022-11-13 ENCOUNTER — Encounter: Payer: Self-pay | Admitting: *Deleted

## 2022-11-13 ENCOUNTER — Ambulatory Visit (HOSPITAL_BASED_OUTPATIENT_CLINIC_OR_DEPARTMENT_OTHER): Payer: Medicaid Other | Admitting: *Deleted

## 2022-11-13 VITALS — BP 130/71 | HR 104

## 2022-11-13 DIAGNOSIS — O24419 Gestational diabetes mellitus in pregnancy, unspecified control: Secondary | ICD-10-CM

## 2022-11-13 DIAGNOSIS — O99213 Obesity complicating pregnancy, third trimester: Secondary | ICD-10-CM

## 2022-11-13 DIAGNOSIS — Z3A34 34 weeks gestation of pregnancy: Secondary | ICD-10-CM | POA: Insufficient documentation

## 2022-11-13 DIAGNOSIS — O10913 Unspecified pre-existing hypertension complicating pregnancy, third trimester: Secondary | ICD-10-CM | POA: Diagnosis not present

## 2022-11-13 DIAGNOSIS — O09293 Supervision of pregnancy with other poor reproductive or obstetric history, third trimester: Secondary | ICD-10-CM | POA: Diagnosis not present

## 2022-11-13 DIAGNOSIS — O09899 Supervision of other high risk pregnancies, unspecified trimester: Secondary | ICD-10-CM

## 2022-11-13 DIAGNOSIS — O10013 Pre-existing essential hypertension complicating pregnancy, third trimester: Secondary | ICD-10-CM

## 2022-11-13 DIAGNOSIS — E669 Obesity, unspecified: Secondary | ICD-10-CM | POA: Diagnosis not present

## 2022-11-13 NOTE — Procedures (Signed)
Leslie Skinner May 12, 1995 [redacted]w[redacted]d  Fetus A Non-Stress Test Interpretation for 11/13/22-NST only  Indication: Chronic Hypertenstion and obese, his shoulder dystocia  Fetal Heart Rate A Mode: External Baseline Rate (A): 150 bpm Variability: Moderate Accelerations: 15 x 15 Decelerations: None Multiple birth?: No  Uterine Activity Mode: Toco Contraction Frequency (min): rare Contraction Duration (sec): 40 Contraction Quality: Mild Resting Tone Palpated: Relaxed  Interpretation (Fetal Testing) Nonstress Test Interpretation: Reactive Comments: Tracing reivewed by Dr.Shankar

## 2022-11-15 ENCOUNTER — Ambulatory Visit (INDEPENDENT_AMBULATORY_CARE_PROVIDER_SITE_OTHER): Payer: Medicaid Other | Admitting: Obstetrics and Gynecology

## 2022-11-15 VITALS — BP 124/78 | HR 97 | Wt 282.1 lb

## 2022-11-15 DIAGNOSIS — O24419 Gestational diabetes mellitus in pregnancy, unspecified control: Secondary | ICD-10-CM

## 2022-11-15 DIAGNOSIS — Z3483 Encounter for supervision of other normal pregnancy, third trimester: Secondary | ICD-10-CM | POA: Diagnosis not present

## 2022-11-15 DIAGNOSIS — O0990 Supervision of high risk pregnancy, unspecified, unspecified trimester: Secondary | ICD-10-CM

## 2022-11-15 DIAGNOSIS — O10919 Unspecified pre-existing hypertension complicating pregnancy, unspecified trimester: Secondary | ICD-10-CM

## 2022-11-15 DIAGNOSIS — Z6841 Body Mass Index (BMI) 40.0 and over, adult: Secondary | ICD-10-CM

## 2022-11-15 DIAGNOSIS — Z3A35 35 weeks gestation of pregnancy: Secondary | ICD-10-CM

## 2022-11-15 DIAGNOSIS — O09299 Supervision of pregnancy with other poor reproductive or obstetric history, unspecified trimester: Secondary | ICD-10-CM

## 2022-11-15 NOTE — Progress Notes (Signed)
Pt reports fetal movement, denies pain. Reports  that she did not bring BG log but fasting BGs range from 90-94

## 2022-11-15 NOTE — Progress Notes (Signed)
   PRENATAL VISIT NOTE  Subjective:  Leslie Skinner is a 27 y.o. [phone] at [redacted]w[redacted]d being seen today for ongoing prenatal care.  She is currently monitored for the following issues for this high-risk pregnancy and has Obesity, Class III, BMI 40-49.9 (morbid obesity) (HCC); Supervision of high risk pregnancy, antepartum; Maternal morbid obesity, antepartum (HCC); Previous pregnancy with congenital heart defect, currently pregnant (TOF); Chronic hypertension affecting pregnancy; Supervision of other high risk pregnancy, antepartum; BMI 45.0-49.9, adult (HCC); Hx of shoulder dystocia in prior pregnancy, currently pregnant; and Gestational diabetes mellitus (GDM) affecting pregnancy-Insulin on their problem list.  Patient doing well with no acute concerns today. She reports no complaints.  Contractions: Not present. Vag. Bleeding: None.  Movement: Present. Denies leaking of fluid.   The following portions of the patient's history were reviewed and updated as appropriate: allergies, current medications, past family history, past medical history, past social history, past surgical history and problem list. Problem list updated.  Objective:   Vitals:   11/15/22 1120  BP: 124/78  Pulse: 97  Weight: 282 lb 1.6 oz (128 kg)    Fetal Status: Fetal Heart Rate (bpm): 154 Fundal Height: 37 cm Movement: Present     General:  Alert, oriented and cooperative. Patient is in no acute distress.  Skin: Skin is warm and dry. No rash noted.   Cardiovascular: Normal heart rate noted  Respiratory: Normal respiratory effort, no problems with respiration noted  Abdomen: Soft, gravid, appropriate for gestational age.  Pain/Pressure: Absent     Pelvic: Cervical exam deferred        Extremities: Normal range of motion.  Edema: None  Mental Status:  Normal mood and affect. Normal behavior. Normal judgment and thought content.   Assessment and Plan:  Pregnancy: G4P3003 at [redacted]w[redacted]d  1. Supervision of high risk  pregnancy, antepartum Continue routine prenatal care  2. [redacted] weeks gestation of pregnancy   3. Chronic hypertension affecting pregnancy BP normal without meds, pt continues baby ASA  4. Gestational diabetes mellitus (GDM) affecting pregnancy-Insulin Pt did not bring in blood sugars, only gave estimates FBS: 94 PPBS: avg 96 (?)   Pt strongly advised to bring in blood sugars.  Per pt she effectively has not been taking any insulin because she says her blood sugars are "good".  Continue weekly testing  5. BMI 45.0-49.9, adult (HCC)   6. Hx of shoulder dystocia in prior pregnancy, currently pregnant Monitor EFW and consider delivery at or before 39 weeks if needed  7. Previous pregnancy with congenital heart defect, currently pregnant (TOF) Echo normal  Preterm labor symptoms and general obstetric precautions including but not limited to vaginal bleeding, contractions, leaking of fluid and fetal movement were reviewed in detail with the patient.  Please refer to After Visit Summary for other counseling recommendations.   Return in about 1 week (around 11/22/2022) for [facility], in person, 36 weeks swabs.   Mariel Aloe, MD Faculty Attending Center for [facility]

## 2022-11-21 ENCOUNTER — Ambulatory Visit: Payer: Medicaid Other | Attending: Maternal & Fetal Medicine

## 2022-11-21 ENCOUNTER — Ambulatory Visit: Payer: Medicaid Other

## 2022-11-21 DIAGNOSIS — Z3A36 36 weeks gestation of pregnancy: Secondary | ICD-10-CM

## 2022-11-21 DIAGNOSIS — O24414 Gestational diabetes mellitus in pregnancy, insulin controlled: Secondary | ICD-10-CM | POA: Insufficient documentation

## 2022-11-21 DIAGNOSIS — O10013 Pre-existing essential hypertension complicating pregnancy, third trimester: Secondary | ICD-10-CM | POA: Diagnosis not present

## 2022-11-21 DIAGNOSIS — O99213 Obesity complicating pregnancy, third trimester: Secondary | ICD-10-CM | POA: Diagnosis not present

## 2022-11-21 DIAGNOSIS — O09293 Supervision of pregnancy with other poor reproductive or obstetric history, third trimester: Secondary | ICD-10-CM

## 2022-11-21 DIAGNOSIS — O3663X0 Maternal care for excessive fetal growth, third trimester, not applicable or unspecified: Secondary | ICD-10-CM | POA: Diagnosis not present

## 2022-11-21 DIAGNOSIS — E669 Obesity, unspecified: Secondary | ICD-10-CM

## 2022-11-22 ENCOUNTER — Encounter: Payer: Medicaid Other | Admitting: Student

## 2022-11-27 ENCOUNTER — Encounter: Payer: Self-pay | Admitting: *Deleted

## 2022-11-29 ENCOUNTER — Ambulatory Visit: Payer: Medicaid Other

## 2022-11-29 ENCOUNTER — Ambulatory Visit: Payer: Medicaid Other | Attending: Maternal & Fetal Medicine

## 2022-11-29 ENCOUNTER — Ambulatory Visit (INDEPENDENT_AMBULATORY_CARE_PROVIDER_SITE_OTHER): Payer: Medicaid Other | Admitting: Obstetrics and Gynecology

## 2022-11-29 ENCOUNTER — Other Ambulatory Visit (HOSPITAL_COMMUNITY)
Admission: RE | Admit: 2022-11-29 | Discharge: 2022-11-29 | Disposition: A | Discharge status: Home / Self Care | Payer: Medicaid Other | Source: Ambulatory Visit | Attending: Obstetrics and Gynecology | Admitting: Obstetrics and Gynecology

## 2022-11-29 ENCOUNTER — Encounter: Payer: Self-pay | Admitting: Obstetrics and Gynecology

## 2022-11-29 VITALS — BP 133/85 | HR 104 | Wt 277.0 lb

## 2022-11-29 DIAGNOSIS — Z3A37 37 weeks gestation of pregnancy: Secondary | ICD-10-CM | POA: Diagnosis not present

## 2022-11-29 DIAGNOSIS — O0990 Supervision of high risk pregnancy, unspecified, unspecified trimester: Secondary | ICD-10-CM | POA: Insufficient documentation

## 2022-11-29 DIAGNOSIS — O24419 Gestational diabetes mellitus in pregnancy, unspecified control: Secondary | ICD-10-CM

## 2022-11-29 DIAGNOSIS — O10919 Unspecified pre-existing hypertension complicating pregnancy, unspecified trimester: Secondary | ICD-10-CM | POA: Diagnosis not present

## 2022-11-29 DIAGNOSIS — Z1339 Encounter for screening examination for other mental health and behavioral disorders: Secondary | ICD-10-CM | POA: Diagnosis not present

## 2022-11-29 DIAGNOSIS — O0993 Supervision of high risk pregnancy, unspecified, third trimester: Secondary | ICD-10-CM | POA: Diagnosis not present

## 2022-11-29 DIAGNOSIS — O09299 Supervision of pregnancy with other poor reproductive or obstetric history, unspecified trimester: Secondary | ICD-10-CM

## 2022-11-29 DIAGNOSIS — O99210 Obesity complicating pregnancy, unspecified trimester: Secondary | ICD-10-CM

## 2022-11-29 NOTE — Progress Notes (Signed)
ROB/GBS.  GAD-7=18

## 2022-11-29 NOTE — Progress Notes (Signed)
   PRENATAL VISIT NOTE  Subjective:  Leslie Skinner is a 27 y.o. [phone] at [redacted]w[redacted]d being seen today for ongoing prenatal care.  She is currently monitored for the following issues for this high-risk pregnancy and has Supervision of high risk pregnancy, antepartum; Maternal morbid obesity, antepartum (HCC); Previous pregnancy with congenital heart defect, currently pregnant (TOF); Chronic hypertension affecting pregnancy; BMI 45.0-49.9, adult (HCC); Hx of shoulder dystocia in prior pregnancy, currently pregnant; and Gestational diabetes mellitus (GDM) affecting pregnancy-Insulin on their problem list.  Patient reports no complaints.  Contractions: Not present. Vag. Bleeding: None.  Movement: Present. Denies leaking of fluid.   The following portions of the patient's history were reviewed and updated as appropriate: allergies, current medications, past family history, past medical history, past social history, past surgical history and problem list.   Objective:   Vitals:   11/29/22 1101  BP: 133/85  Pulse: (!) 104  Weight: 277 lb (125.6 kg)    Fetal Status: Fetal Heart Rate (bpm): 149 Fundal Height: 39 cm Movement: Present     General:  Alert, oriented and cooperative. Patient is in no acute distress.  Skin: Skin is warm and dry. No rash noted.   Cardiovascular: Normal heart rate noted  Respiratory: Normal respiratory effort, no problems with respiration noted  Abdomen: Soft, gravid, appropriate for gestational age.  Pain/Pressure: Present     Pelvic: Cervical exam deferred        Extremities: Normal range of motion.  Edema: None  Mental Status: Normal mood and affect. Normal behavior. Normal judgment and thought content.   Assessment and Plan:  Pregnancy: G4P3003 at [redacted]w[redacted]d 1. Supervision of high risk pregnancy, antepartum [O09.90] Patient is doing well without complaints Cultures today Patient declined cervical check - Culture, beta strep (group b only) - Cervicovaginal  ancillary only( [place]) - Ambulatory referral to Integrated [facility] - CBC; Standing - Type and screen; Standing - RPR; Standing  2. Gestational diabetes mellitus (GDM) affecting pregnancy-Insulin Patient did not bring CBG log She admits that she discontinued insulin She reports fasting as high as 97 and all pp less than 100 Per MFM recommend IOL at 37 weeks. Patient declined and is willing to be induced next weekend. Orders placed in epic  3. Chronic hypertension affecting pregnancy Stable  4. Hx of shoulder dystocia in prior pregnancy, currently pregnant   5. Maternal morbid obesity, antepartum (HCC)   6. Previous pregnancy with congenital heart defect, currently pregnant (TOF)   Term labor symptoms and general obstetric precautions including but not limited to vaginal bleeding, contractions, leaking of fluid and fetal movement were reviewed in detail with the patient. Please refer to After Visit Summary for other counseling recommendations.   No follow-ups on file.  Future Appointments  Date Time Provider Department Center  11/29/2022  2:15 PM [facility] NURSE [facility]  11/29/2022  2:30 PM [facility] US3 [facility]  12/06/2022 10:55 AM Adam Phenix, MD [facility] None  12/13/2022 10:55 AM Raelyn Mora, CNM [facility] None    Catalina Antigua, MD

## 2022-11-30 LAB — CERVICOVAGINAL ANCILLARY ONLY
Chlamydia: NEGATIVE
Comment: NEGATIVE
Comment: NORMAL
Neisseria Gonorrhea: NEGATIVE

## 2022-12-03 ENCOUNTER — Encounter: Payer: Self-pay | Admitting: Obstetrics & Gynecology

## 2022-12-03 DIAGNOSIS — O99820 Streptococcus B carrier state complicating pregnancy: Secondary | ICD-10-CM | POA: Insufficient documentation

## 2022-12-03 LAB — CULTURE, BETA STREP (GROUP B ONLY): Strep Gp B Culture: POSITIVE — AB

## 2022-12-04 ENCOUNTER — Telehealth (HOSPITAL_COMMUNITY): Payer: Self-pay | Admitting: *Deleted

## 2022-12-04 ENCOUNTER — Encounter (HOSPITAL_COMMUNITY): Payer: Self-pay | Admitting: *Deleted

## 2022-12-04 NOTE — Telephone Encounter (Signed)
Preadmission screen  

## 2022-12-06 ENCOUNTER — Encounter: Payer: Medicaid Other | Admitting: Obstetrics & Gynecology

## 2022-12-08 ENCOUNTER — Inpatient Hospital Stay (HOSPITAL_COMMUNITY): Payer: Medicaid Other

## 2022-12-08 ENCOUNTER — Encounter (HOSPITAL_COMMUNITY): Payer: Self-pay | Admitting: Obstetrics & Gynecology

## 2022-12-08 ENCOUNTER — Inpatient Hospital Stay (EMERGENCY_DEPARTMENT_HOSPITAL)
Admission: AD | Admit: 2022-12-08 | Discharge: 2022-12-09 | Disposition: A | Discharge status: Home / Self Care | Payer: Medicaid Other | Source: Home / Self Care | Attending: Obstetrics & Gynecology | Admitting: Obstetrics & Gynecology

## 2022-12-08 DIAGNOSIS — R102 Pelvic and perineal pain: Secondary | ICD-10-CM | POA: Insufficient documentation

## 2022-12-08 DIAGNOSIS — O10013 Pre-existing essential hypertension complicating pregnancy, third trimester: Secondary | ICD-10-CM | POA: Insufficient documentation

## 2022-12-08 DIAGNOSIS — O26893 Other specified pregnancy related conditions, third trimester: Secondary | ICD-10-CM | POA: Insufficient documentation

## 2022-12-08 DIAGNOSIS — O24113 Pre-existing diabetes mellitus, type 2, in pregnancy, third trimester: Secondary | ICD-10-CM | POA: Insufficient documentation

## 2022-12-08 DIAGNOSIS — Z3A38 38 weeks gestation of pregnancy: Secondary | ICD-10-CM | POA: Insufficient documentation

## 2022-12-08 NOTE — MAU Note (Signed)
..  Leslie Skinner is a 27 y.o. at [redacted]w[redacted]d here in MAU reporting: vaginal pressure and lower back pressure, both began around 8 pm. Denies contractions. +FM Denies vaginal bleeding or leaking of fluid  Pain score: 6/10 Vitals:   12/08/22 2318  BP: 130/88  Pulse: (!) 106  Resp: 18  Temp: 97.9 F (36.6 C)  SpO2: 100%     FHT:150 Lab orders placed from triage: ua

## 2022-12-09 ENCOUNTER — Other Ambulatory Visit: Payer: Self-pay

## 2022-12-09 ENCOUNTER — Inpatient Hospital Stay (HOSPITAL_COMMUNITY)
Admission: RE | Admit: 2022-12-09 | Discharge: 2022-12-13 | DRG: 806 | Disposition: A | Discharge status: Home / Self Care | Payer: Medicaid Other | Attending: Family Medicine | Admitting: Family Medicine

## 2022-12-09 ENCOUNTER — Encounter (HOSPITAL_COMMUNITY): Payer: Self-pay | Admitting: Obstetrics and Gynecology

## 2022-12-09 DIAGNOSIS — Z884 Allergy status to anesthetic agent status: Secondary | ICD-10-CM | POA: Diagnosis not present

## 2022-12-09 DIAGNOSIS — R109 Unspecified abdominal pain: Secondary | ICD-10-CM

## 2022-12-09 DIAGNOSIS — O1092 Unspecified pre-existing hypertension complicating childbirth: Secondary | ICD-10-CM | POA: Diagnosis present

## 2022-12-09 DIAGNOSIS — O99210 Obesity complicating pregnancy, unspecified trimester: Secondary | ICD-10-CM | POA: Diagnosis present

## 2022-12-09 DIAGNOSIS — O9962 Diseases of the digestive system complicating childbirth: Secondary | ICD-10-CM | POA: Diagnosis present

## 2022-12-09 DIAGNOSIS — O9902 Anemia complicating childbirth: Secondary | ICD-10-CM | POA: Diagnosis present

## 2022-12-09 DIAGNOSIS — O24424 Gestational diabetes mellitus in childbirth, insulin controlled: Secondary | ICD-10-CM | POA: Diagnosis present

## 2022-12-09 DIAGNOSIS — R102 Pelvic and perineal pain: Secondary | ICD-10-CM | POA: Diagnosis not present

## 2022-12-09 DIAGNOSIS — Z833 Family history of diabetes mellitus: Secondary | ICD-10-CM

## 2022-12-09 DIAGNOSIS — Z885 Allergy status to narcotic agent status: Secondary | ICD-10-CM

## 2022-12-09 DIAGNOSIS — O99820 Streptococcus B carrier state complicating pregnancy: Secondary | ICD-10-CM

## 2022-12-09 DIAGNOSIS — O26893 Other specified pregnancy related conditions, third trimester: Secondary | ICD-10-CM

## 2022-12-09 DIAGNOSIS — O99824 Streptococcus B carrier state complicating childbirth: Secondary | ICD-10-CM | POA: Diagnosis present

## 2022-12-09 DIAGNOSIS — Z8349 Family history of other endocrine, nutritional and metabolic diseases: Secondary | ICD-10-CM

## 2022-12-09 DIAGNOSIS — Z7982 Long term (current) use of aspirin: Secondary | ICD-10-CM | POA: Diagnosis not present

## 2022-12-09 DIAGNOSIS — K219 Gastro-esophageal reflux disease without esophagitis: Secondary | ICD-10-CM | POA: Diagnosis present

## 2022-12-09 DIAGNOSIS — Z3A38 38 weeks gestation of pregnancy: Secondary | ICD-10-CM

## 2022-12-09 DIAGNOSIS — O1002 Pre-existing essential hypertension complicating childbirth: Secondary | ICD-10-CM | POA: Diagnosis not present

## 2022-12-09 DIAGNOSIS — Z30017 Encounter for initial prescription of implantable subdermal contraceptive: Secondary | ICD-10-CM

## 2022-12-09 DIAGNOSIS — O24419 Gestational diabetes mellitus in pregnancy, unspecified control: Principal | ICD-10-CM | POA: Diagnosis present

## 2022-12-09 DIAGNOSIS — Z8249 Family history of ischemic heart disease and other diseases of the circulatory system: Secondary | ICD-10-CM

## 2022-12-09 DIAGNOSIS — O99214 Obesity complicating childbirth: Secondary | ICD-10-CM | POA: Diagnosis present

## 2022-12-09 DIAGNOSIS — Z3490 Encounter for supervision of normal pregnancy, unspecified, unspecified trimester: Secondary | ICD-10-CM | POA: Diagnosis present

## 2022-12-09 DIAGNOSIS — O09299 Supervision of pregnancy with other poor reproductive or obstetric history, unspecified trimester: Secondary | ICD-10-CM

## 2022-12-09 DIAGNOSIS — O0990 Supervision of high risk pregnancy, unspecified, unspecified trimester: Secondary | ICD-10-CM

## 2022-12-09 DIAGNOSIS — O10919 Unspecified pre-existing hypertension complicating pregnancy, unspecified trimester: Secondary | ICD-10-CM | POA: Diagnosis present

## 2022-12-09 DIAGNOSIS — Z823 Family history of stroke: Secondary | ICD-10-CM

## 2022-12-09 LAB — CBC
HCT: 33.4 % — ABNORMAL LOW (ref 36.0–46.0)
Hemoglobin: 10.6 g/dL — ABNORMAL LOW (ref 12.0–15.0)
MCH: 23.6 pg — ABNORMAL LOW (ref 26.0–34.0)
MCHC: 31.7 g/dL (ref 30.0–36.0)
MCV: 74.2 fL — ABNORMAL LOW (ref 80.0–100.0)
Platelets: 237 10*3/uL (ref 150–400)
RBC: 4.5 MIL/uL (ref 3.87–5.11)
RDW: 17.2 % — ABNORMAL HIGH (ref 11.5–15.5)
WBC: 11.6 10*3/uL — ABNORMAL HIGH (ref 4.0–10.5)
nRBC: 0.2 % (ref 0.0–0.2)

## 2022-12-09 LAB — URINALYSIS, ROUTINE W REFLEX MICROSCOPIC
Bilirubin Urine: NEGATIVE
Glucose, UA: NEGATIVE mg/dL
Hgb urine dipstick: NEGATIVE
Ketones, ur: NEGATIVE mg/dL
Nitrite: NEGATIVE
Protein, ur: NEGATIVE mg/dL
Specific Gravity, Urine: 1.013 (ref 1.005–1.030)
pH: 6 (ref 5.0–8.0)

## 2022-12-09 LAB — GLUCOSE, CAPILLARY
Glucose-Capillary: 91 mg/dL (ref 70–99)
Glucose-Capillary: 117 mg/dL — ABNORMAL HIGH (ref 70–99)

## 2022-12-09 LAB — TYPE AND SCREEN
ABO/RH(D): A POS
Antibody Screen: NEGATIVE

## 2022-12-09 MED ORDER — TERBUTALINE SULFATE 1 MG/ML IJ SOLN: 0.2500 mg | Freq: Once | INTRAMUSCULAR | Status: DC | PRN

## 2022-12-09 MED ORDER — SOD CITRATE-CITRIC ACID 500-334 MG/5ML PO SOLN: 30.0000 mL | ORAL | Status: DC | PRN

## 2022-12-09 MED ORDER — INSULIN GLARGINE 100 UNITS/ML SOLOSTAR PEN
15.0000 [IU] | PEN_INJECTOR | Freq: Two times a day (BID) | SUBCUTANEOUS | Status: DC
  Administered 2022-12-09 – 2022-12-11 (×4): 15 [IU] via SUBCUTANEOUS
  Filled 2022-12-09: qty 3
  Filled 2022-12-09: qty 0.15

## 2022-12-09 MED ORDER — LIDOCAINE HCL (PF) 1 % IJ SOLN: 30.0000 mL | INTRAMUSCULAR | Status: DC | PRN

## 2022-12-09 MED ORDER — LACTATED RINGERS IV SOLN: INTRAVENOUS | Status: DC

## 2022-12-09 MED ORDER — MISOPROSTOL 50MCG HALF TABLET
50.0000 ug | ORAL_TABLET | ORAL | Status: DC | PRN
  Administered 2022-12-09 – 2022-12-10 (×4): 50 ug via BUCCAL
  Filled 2022-12-09 (×4): qty 1

## 2022-12-09 MED ORDER — PENICILLIN G POT IN DEXTROSE 60000 UNIT/ML IV SOLN
3.0000 10*6.[IU] | INTRAVENOUS | Status: DC
  Administered 2022-12-09 – 2022-12-11 (×10): 3 10*6.[IU] via INTRAVENOUS
  Filled 2022-12-09 (×13): qty 50

## 2022-12-09 MED ORDER — FENTANYL CITRATE (PF) 100 MCG/2ML IJ SOLN
100.0000 ug | INTRAMUSCULAR | Status: DC | PRN
  Administered 2022-12-09 – 2022-12-10 (×5): 100 ug via INTRAVENOUS
  Filled 2022-12-09 (×5): qty 2

## 2022-12-09 MED ORDER — OXYTOCIN-SODIUM CHLORIDE 30-0.9 UT/500ML-% IV SOLN
2.5000 [IU]/h | INTRAVENOUS | Status: DC
  Filled 2022-12-09 (×2): qty 500

## 2022-12-09 MED ORDER — SODIUM CHLORIDE 0.9 % IV SOLN
5.0000 10*6.[IU] | Freq: Once | INTRAVENOUS | Status: AC
  Administered 2022-12-09: 5 10*6.[IU] via INTRAVENOUS
  Filled 2022-12-09: qty 5

## 2022-12-09 MED ORDER — LACTATED RINGERS IV SOLN: 500.0000 mL | INTRAVENOUS | Status: DC | PRN

## 2022-12-09 MED ORDER — OXYCODONE-ACETAMINOPHEN 5-325 MG PO TABS: 1.0000 | ORAL_TABLET | ORAL | Status: DC | PRN

## 2022-12-09 MED ORDER — OXYTOCIN BOLUS FROM INFUSION
333.0000 mL | Freq: Once | INTRAVENOUS | Status: AC
  Administered 2022-12-11: 333 mL via INTRAVENOUS

## 2022-12-09 MED ORDER — ACETAMINOPHEN 325 MG PO TABS
650.0000 mg | ORAL_TABLET | ORAL | Status: DC | PRN
  Administered 2022-12-10: 650 mg via ORAL
  Filled 2022-12-09: qty 2

## 2022-12-09 MED ORDER — ONDANSETRON HCL 4 MG/2ML IJ SOLN
4.0000 mg | Freq: Four times a day (QID) | INTRAMUSCULAR | Status: DC | PRN
  Administered 2022-12-11: 4 mg via INTRAVENOUS
  Filled 2022-12-09: qty 2

## 2022-12-09 MED ORDER — OXYCODONE-ACETAMINOPHEN 5-325 MG PO TABS: 2.0000 | ORAL_TABLET | ORAL | Status: DC | PRN

## 2022-12-09 NOTE — H&P (Signed)
Leslie Skinner is a 27 y.o. G34P3003 female at [redacted]w[redacted]d by [redacted]w[redacted]d u/s presenting for IOL due to A2GDM, also with hx cHTN (no meds).   Reports active fetal movement, contractions: irreg, mild; vaginal bleeding: none, membranes: intact.  Initiated prenatal care at [facility] at 14.5 wks.   Most recent u/s : [redacted]w[redacted]d, EFW 98% 3568g, AFI 14cm, cephalic, ant placenta, BPP 8/8.   This pregnancy complicated by: # A2GDM (Lantus 15u bid); EFW 7+14 @ 36wk # hx cHTN (no meds) # GBS+  Prenatal History/Complications:  # VD x 3 (2019, 2021, 2023) # hx SD w third delivery (weight 8+5; note doesn't give details; Apgars 7/8) # prev baby w TOF (nl fetal echo this preg)  Past Medical History: Past Medical History:  Diagnosis Date   Acanthosis nigricans, acquired    Anemia in pregnancy, third trimester 10/21/2021   Anxiety state 06/12/2012   Chronic bilateral thoracic back pain 08/05/2018   Current mild episode of major depressive disorder (HCC) 11/14/2017   GERD (gastroesophageal reflux disease)    GERD (gastroesophageal reflux disease)    Gestational diabetes    Goiter    History of postpartum depression 05/03/2019   Hypertension    LGSIL on Pap smear of cervix with +HRHPV on 06/15/21 06/26/2021   LGSIL on Pap smear of cervix with +HRHPV on 06/15/21 06/26/2021   [x]  Colpo - normal without biopsy    Migraine 11/08/2010   Not on meds at this time.   Migraines    Obesity    Obesity, Class III, BMI 40-49.9 (morbid obesity) (HCC) 08/23/2011   Body mass index is 41.73 kg/m.        Osteochondroma of lower leg    Pollen allergies    Pre-diabetes 06/12/2012   Prediabetes    Reports having DM as a child, but not when older? No records.  Normal/prediabetic A1Cs   Pregnancy induced hypertension    Scoliosis    Scoliosis 11/22/2010   Spinal headache    Supervision of other high risk pregnancy, antepartum 06/12/2022              Nursing Staff    Provider      Office Location    [facility]    Dating      12/19/2022, by Last Menstrual Period      [facility] Model    Traditional                Language     English    Anatomy US            Flu Vaccine          Genetic/Carrier Screen     NIPS: low risk female  AFP:     Horizon:       TDaP Vaccine          Hgb A1C or   GTT    Early -   Third trimester -       COVID Vaccine           Past Surgical History: Past Surgical History:  Procedure Laterality Date   LOWER LEG SOFT TISSUE TUMOR EXCISION  2011   OSTEOCHONDROMA EXCISION      Obstetrical History: OB History     Gravida  4   Para  3   Term  3   Preterm      AB  0   Living  3      SAB  0   IAB  Ectopic      Multiple  0   Live Births  3           Social History: Social History   Socioeconomic History   Marital status: Single    Spouse name: Not on file   Number of children: 1   Years of education: 12th   Highest education level: High school graduate  Occupational History   Not on file  Tobacco Use   Smoking status: Never    Passive exposure: Yes   Smokeless tobacco: Never  Vaping Use   Vaping status: Never Used  Substance and Sexual Activity   Alcohol use: No   Drug use: No   Sexual activity: Yes    Birth control/protection: Patch  Other Topics Concern   Not on file  Social History Narrative   Lives with Mom Elayne Snare), sister Cala Bradford Minor 1993), and nephew (Keenan Trefry's son, [name] 2011).   12th grade at [employer] - did not graduate- will finish fall 2014.      Patient is living with her sisters Cala Bradford Minor and Shanda Bumps Minor) as of May 2020. She has her one year old son [name]. Patient reported she has been do depressed/down that she went to live with siblings. She really only talk to her one year son, which he is really not talking. Clovis Pu, RN 12/15/18   Social Determinants of Health   Financial Resource Strain: Low Risk  (10/22/2017)   Overall Financial Resource Strain (CARDIA)    Difficulty of Paying Living Expenses:  Not hard at all  Food Insecurity: No Food Insecurity (12/09/2022)   Hunger Vital Sign    Worried About Running Out of Food in the Last Year: Never true    Ran Out of Food in the Last Year: Never true  Transportation Needs: No Transportation Needs (12/09/2022)   PRAPARE - Administrator, Civil Service (Medical): No    Lack of Transportation (Non-Medical): No  Physical Activity: Inactive (12/15/2018)   Exercise Vital Sign    Days of Exercise per Week: 0 days    Minutes of Exercise per Session: 0 min  Stress: Stress Concern Present (12/15/2018)   [employer] of Occupational Health - Occupational Stress Questionnaire    Feeling of Stress : Very much  Social Connections: Unknown (07/25/2021)   Received from [facility]   Social Network    Social Network: Not on file    Family History: Family History  Problem Relation Age of Onset   Diabetes Mother    Ulcers Mother    Thyroid disease Mother    Cancer Mother    Obesity Mother    Heart disease Mother    Heart attack Mother        caused death    Diabetes Maternal Grandmother    Stroke Maternal Grandmother    Ulcers Maternal Grandmother    Diabetes Maternal Grandfather    GER disease Maternal Grandfather    Heart disease Maternal Grandfather    GER disease Sister    Diabetes Father    GER disease Father    Obesity Sister    Diabetes Paternal Grandmother    Thyroid disease Paternal Grandmother    Diabetes Paternal Grandfather    Heart disease Maternal Aunt    Diabetes Maternal Aunt    Thyroid disease Maternal Aunt    Cancer Maternal Aunt    Obesity Maternal Aunt    GER disease Maternal Aunt  Allergies: Allergies  Allergen Reactions   Morphine And Codeine Other (See Comments)    Hot flashes, can't breathe   Tramadol Other (See Comments)    Hot flashes    Pitocin [Oxytocin] Itching and Rash    Per patient it occurred the last time she was induced.    Medications Prior to Admission   Medication Sig Dispense Refill Last Dose   Accu-Chek Softclix Lancets lancets Please use a different lancet 4 times a day to check blood sugar. 100 each 12 12/08/2022   acetaminophen (TYLENOL) 325 MG tablet Take 2 tablets (650 mg total) by mouth every 6 (six) hours as needed (for pain scale < 4).   Past Week   aspirin 81 MG chewable tablet Chew 1 tablet (81 mg total) by mouth daily. 30 tablet 8 12/08/2022   Blood Glucose Monitoring Suppl (ACCU-CHEK GUIDE) w/Device KIT 1 Device by Does not apply route 4 (four) times daily. 1 kit 0 12/08/2022   cyclobenzaprine (FLEXERIL) 5 MG tablet Take 1 tablet (5 mg total) by mouth 3 (three) times daily as needed for muscle spasms. 30 tablet 1 Past Week   glucose blood (ACCU-CHEK GUIDE) test strip Use to check blood sugars four times a day was instructed. 50 each 12 12/08/2022   insulin glargine (LANTUS) 100 unit/mL SOPN Inject 15 Units into the skin 2 (two) times daily. 9 mL 2 12/08/2022   Insulin Pen Needle (PEN NEEDLES) 32G X 4 MM MISC Use with insulin pen for injections, as described 60 each 3 12/08/2022   Prenatal Vit-Fe Fumarate-FA (MULTIVITAMIN-PRENATAL) 27-0.8 MG TABS tablet Take 1 tablet by mouth daily at 12 noon.   12/09/2022   terbinafine (LAMISIL) 1 % cream Apply 1 Application topically daily. 30 g 0     Review of Systems  Pertinent pos/neg as indicated in HPI  Blood pressure 135/77, pulse (!) 104, temperature 97.7 F (36.5 C), temperature source Oral, resp. rate 20, height 5\' 3"  (1.6 m), weight 128 kg, currently breastfeeding. General appearance: alert, cooperative, and no distress Lungs: clear to auscultation bilaterally Heart: regular rate and rhythm Abdomen: gravid, soft, non-tender, EFW by Leopold's approximately 8lbs Extremities: trace edema  Fetal monitoring: FHR: 145-150 bpm, variability: moderate,  Accelerations: Present,  decelerations:  Absent Uterine activity: irreg, mild   Presentation: cephalic   Prenatal labs: ABO, Rh: CANCELED,  A/CANCELED, Positive/-- (04/15 1205) Antibody: CANCELED, Negative (04/15 1205) Rubella: 4.11, 4.27 (04/15 1205) RPR: Non Reactive (07/10 1037)  HBsAg: Negative, Negative (04/15 1205)  HIV: Non Reactive (07/10 1037)  GBS: Positive/-- (09/19 1425)  2hr GTT: 96, 157, 116  Prenatal Transfer Tool  Maternal Diabetes: Yes:  Diabetes Type:  Insulin/Medication controlled Genetic Screening: Normal Maternal Ultrasounds/Referrals: Normal Fetal Ultrasounds or other Referrals:  Referred to Materal Fetal Medicine  Maternal Substance Abuse:  No Significant Maternal Medications:  Meds include: Other:  Lantus 15u bid Significant Maternal Lab Results: Group B Strep positive  Results for orders placed or performed during the hospital encounter of 12/09/22 (from the past 24 hour(s))  CBC   Collection Time: 12/09/22  6:47 PM  Result Value Ref Range   WBC 11.6 (H) 4.0 - 10.5 K/uL   RBC 4.50 3.87 - 5.11 MIL/uL   Hemoglobin 10.6 (L) 12.0 - 15.0 g/dL   HCT 16.1 (L) 09.6 - 04.5 %   MCV 74.2 (L) 80.0 - 100.0 fL   MCH 23.6 (L) 26.0 - 34.0 pg   MCHC 31.7 30.0 - 36.0 g/dL   RDW 40.9 (H) 81.1 -  15.5 %   Platelets 237 150 - 400 K/uL   nRBC 0.2 0.0 - 0.2 %  Glucose, capillary   Collection Time: 12/09/22  7:10 PM  Result Value Ref Range   Glucose-Capillary 91 70 - 99 mg/dL  Results for orders placed or performed during the hospital encounter of 12/08/22 (from the past 24 hour(s))  Urinalysis, Routine w reflex microscopic -Urine, Clean Catch   Collection Time: 12/08/22 11:53 PM  Result Value Ref Range   Color, Urine YELLOW YELLOW   APPearance HAZY (A) CLEAR   Specific Gravity, Urine 1.013 1.005 - 1.030   pH 6.0 5.0 - 8.0   Glucose, UA NEGATIVE NEGATIVE mg/dL   Hgb urine dipstick NEGATIVE NEGATIVE   Bilirubin Urine NEGATIVE NEGATIVE   Ketones, ur NEGATIVE NEGATIVE mg/dL   Protein, ur NEGATIVE NEGATIVE mg/dL   Nitrite NEGATIVE NEGATIVE   Leukocytes,Ua SMALL (A) NEGATIVE   RBC / HPF 0-5 0 - 5 RBC/hpf    WBC, UA 6-10 0 - 5 WBC/hpf   Bacteria, UA FEW (A) NONE SEEN   Squamous Epithelial / HPF 6-10 0 - 5 /HPF   Mucus PRESENT      Assessment:  [redacted]w[redacted]d SIUP  G4P3003  A2GDM- Lantus 15u bid  Hx cHTN (no meds)  Cx unfavorable  Cat 1 FHR  GBS Positive/-- (09/19 1425)  Plan:  Admit to L&D  IV pain meds/epidural prn active labor  Will start w cytotec for cervical ripening, and then progress to cervical foley/Pit prn (pt prefers buccal v vag)  Anticipate vag delivery   Plans to breast and bottlefeed  Contraception: unsure   Arabella Merles CNM 12/09/2022, 7:34 PM

## 2022-12-09 NOTE — MAU Provider Note (Signed)
Chief Complaint:  Pelvic Pain and Back Pain   Event Date/Time   First Provider Initiated Contact with Patient 12/09/22 0038      HPI: Leslie Skinner is a 27 y.o. [phone] at [redacted]w[redacted]d with hx significant for shoulder dystocia, currently A2DM, obesity, CHTN, who presents to maternity admissions reporting constant pelvic pressure and low back pain x 1 day.  She was scheduled for IOL on 12/08/22 but was not able to come in to L&D when called.  She reports good fetal movement, denies LOF, vaginal bleeding, vaginal itching/burning, urinary symptoms, h/a, dizziness, n/v, or fever/chills.      HPI  Past Medical History: Past Medical History:  Diagnosis Date   Acanthosis nigricans, acquired    Anemia in pregnancy, third trimester 10/21/2021   Anxiety state 06/12/2012   Chronic bilateral thoracic back pain 08/05/2018   Current mild episode of major depressive disorder (HCC) 11/14/2017   GERD (gastroesophageal reflux disease)    GERD (gastroesophageal reflux disease)    Gestational diabetes    Goiter    History of postpartum depression 05/03/2019   Hypertension    LGSIL on Pap smear of cervix with +HRHPV on 06/15/21 06/26/2021   LGSIL on Pap smear of cervix with +HRHPV on 06/15/21 06/26/2021   [x]  Colpo - normal without biopsy    Migraine 11/08/2010   Not on meds at this time.   Migraines    Obesity    Obesity, Class III, BMI 40-49.9 (morbid obesity) (HCC) 08/23/2011   Body mass index is 41.73 kg/m.        Osteochondroma of lower leg    Pollen allergies    Pre-diabetes 06/12/2012   Prediabetes    Reports having DM as a child, but not when older? No records.  Normal/prediabetic A1Cs   Pregnancy induced hypertension    Scoliosis    Scoliosis 11/22/2010   Spinal headache    Supervision of other high risk pregnancy, antepartum 06/12/2022              Nursing Staff    Provider      Office Location    [facility]    Dating     12/19/2022, by Last Menstrual Period      [facility] Model    Traditional                 Language     English    Anatomy US            Flu Vaccine          Genetic/Carrier Screen     NIPS: low risk female  AFP:     Horizon:       TDaP Vaccine          Hgb A1C or   GTT    Early -   Third trimester -       COVID Vaccine           Past obstetric history: OB History  Gravida Para Term Preterm AB Living  4 3 3    0 3  SAB IAB Ectopic Multiple Live Births  0     0 3    # Outcome Date GA Lbr Len/2nd Weight Sex Type Anes PTL Lv  4 Current           3 Term 12/30/21 [redacted]w[redacted]d 12:39 / 01:08 3790 g F Vag-Spont EPI  LIV  2 Term 05/03/19 [redacted]w[redacted]d 04:39 / 02:18 3225 g F Vag-Spont Spinal  LIV  Birth Comments: tetralogy of fallot  1 Term 10/25/17 [redacted]w[redacted]d 08:55 / 02:04 3385 g M Vag-Spont EPI  LIV     Birth Comments: moulding    Past Surgical History: Past Surgical History:  Procedure Laterality Date   LOWER LEG SOFT TISSUE TUMOR EXCISION  2011   OSTEOCHONDROMA EXCISION      Family History: Family History  Problem Relation Age of Onset   Diabetes Mother    Ulcers Mother    Thyroid disease Mother    Cancer Mother    Obesity Mother    Heart disease Mother    Heart attack Mother        caused death    Diabetes Maternal Grandmother    Stroke Maternal Grandmother    Ulcers Maternal Grandmother    Diabetes Maternal Grandfather    GER disease Maternal Grandfather    Heart disease Maternal Grandfather    GER disease Sister    Diabetes Father    GER disease Father    Obesity Sister    Diabetes Paternal Grandmother    Thyroid disease Paternal Grandmother    Diabetes Paternal Grandfather    Heart disease Maternal Aunt    Diabetes Maternal Aunt    Thyroid disease Maternal Aunt    Cancer Maternal Aunt    Obesity Maternal Aunt    GER disease Maternal Aunt     Social History: Social History   Tobacco Use   Smoking status: Never    Passive exposure: Yes   Smokeless tobacco: Never  Vaping Use   Vaping status: Never Used  Substance Use Topics   Alcohol use: No   Drug  use: No    Allergies:  Allergies  Allergen Reactions   Morphine And Codeine Other (See Comments)    Hot flashes, can't breathe   Tramadol Other (See Comments)    Hot flashes    Pitocin [Oxytocin] Itching and Rash    Per patient it occurred the last time she was induced.    Meds:  Medications Prior to Admission  Medication Sig Dispense Refill Last Dose   Accu-Chek Softclix Lancets lancets Please use a different lancet 4 times a day to check blood sugar. 100 each 12 12/08/2022   acetaminophen (TYLENOL) 325 MG tablet Take 2 tablets (650 mg total) by mouth every 6 (six) hours as needed (for pain scale < 4).   12/08/2022   aspirin 81 MG chewable tablet Chew 1 tablet (81 mg total) by mouth daily. 30 tablet 8 12/08/2022   Blood Glucose Monitoring Suppl (ACCU-CHEK GUIDE) w/Device KIT 1 Device by Does not apply route 4 (four) times daily. 1 kit 0 12/08/2022   cyclobenzaprine (FLEXERIL) 5 MG tablet Take 1 tablet (5 mg total) by mouth 3 (three) times daily as needed for muscle spasms. 30 tablet 1 12/08/2022   insulin glargine (LANTUS) 100 unit/mL SOPN Inject 15 Units into the skin 2 (two) times daily. 9 mL 2 12/08/2022   Insulin Pen Needle (PEN NEEDLES) 32G X 4 MM MISC Use with insulin pen for injections, as described 60 each 3 12/08/2022   Prenatal Vit-Fe Fumarate-FA (MULTIVITAMIN-PRENATAL) 27-0.8 MG TABS tablet Take 1 tablet by mouth daily at 12 noon.   12/08/2022   glucose blood (ACCU-CHEK GUIDE) test strip Use to check blood sugars four times a day was instructed. 50 each 12    terbinafine (LAMISIL) 1 % cream Apply 1 Application topically daily. 30 g 0     ROS:  Review of Systems  Constitutional:  Negative for chills, fatigue and fever.  Eyes:  Negative for visual disturbance.  Respiratory:  Negative for shortness of breath.   Cardiovascular:  Negative for chest pain.  Gastrointestinal:  Positive for abdominal pain. Negative for nausea and vomiting.  Genitourinary:  Negative for difficulty  urinating, dysuria, flank pain, pelvic pain, vaginal bleeding, vaginal discharge and vaginal pain.  Musculoskeletal:  Positive for back pain.  Neurological:  Negative for dizziness and headaches.  Psychiatric/Behavioral: Negative.       I have reviewed patient's Past Medical Hx, Surgical Hx, Family Hx, Social Hx, medications and allergies.   Physical Exam  Patient Vitals for the past 24 hrs:  BP Temp Temp src Pulse Resp SpO2 Height Weight  12/08/22 2318 130/88 97.9 F (36.6 C) Oral (!) 106 18 100 % 5\' 3"  (1.6 m) 128.6 kg   Constitutional: Well-developed, well-nourished female in no acute distress.  Cardiovascular: normal rate Respiratory: normal effort GI: Abd soft, non-tender, gravid appropriate for gestational age.  MS: Extremities nontender, no edema, normal ROM Neurologic: Alert and oriented x 4.  GU: Neg CVAT.  PELVIC EXAM: Cervix pink, visually closed, without lesion, scant white creamy discharge, vaginal walls and external genitalia normal Bimanual exam: Cervix 0/long/high, firm, anterior, neg CMT, uterus nontender, nonenlarged, adnexa without tenderness, enlargement, or mass  Dilation: Closed Effacement (%): Thick Station: Ballotable Exam by:: Sharen Counter CNM  FHT:  Baseline 145 , moderate variability, accelerations present, no decelerations Contractions: none on toco or to palpation   Labs: Results for orders placed or performed during the hospital encounter of 12/08/22 (from the past 24 hour(s))  Urinalysis, Routine w reflex microscopic -Urine, Clean Catch     Status: Abnormal   Collection Time: 12/08/22 11:53 PM  Result Value Ref Range   Color, Urine YELLOW YELLOW   APPearance HAZY (A) CLEAR   Specific Gravity, Urine 1.013 1.005 - 1.030   pH 6.0 5.0 - 8.0   Glucose, UA NEGATIVE NEGATIVE mg/dL   Hgb urine dipstick NEGATIVE NEGATIVE   Bilirubin Urine NEGATIVE NEGATIVE   Ketones, ur NEGATIVE NEGATIVE mg/dL   Protein, ur NEGATIVE NEGATIVE mg/dL   Nitrite  NEGATIVE NEGATIVE   Leukocytes,Ua SMALL (A) NEGATIVE   RBC / HPF 0-5 0 - 5 RBC/hpf   WBC, UA 6-10 0 - 5 WBC/hpf   Bacteria, UA FEW (A) NONE SEEN   Squamous Epithelial / HPF 6-10 0 - 5 /HPF   Mucus PRESENT    CANCELED, A/CANCELED, Positive/-- (04/15 1205)  Imaging:   MAU Course/MDM: Orders Placed This Encounter  Procedures   Urinalysis, Routine w reflex microscopic -Urine, Clean Catch   Discharge patient    No orders of the defined types were placed in this encounter.    NST reviewed and reactive No evidence of labor with closed cervix Pt was called for IOL but unable to come in within the 1 hour required Currently, pt is next on the list to come in for  IOL but with full census, this could still be a few hours awa D/C home with labor precautions Pt to be ready to come in when called by L&D  --Anticipatory guidance about next visits/weeks of pregnancy given.   Assessment: 1. Abdominal pain during pregnancy, third trimester   2. Pelvic pressure in pregnancy, third trimester   3. [redacted] weeks gestation of pregnancy     Plan: Discharge home Labor precautions and fetal kick counts  Follow-up Information     [facility] Follow up.  Specialty: Obstetrics and Gynecology Why: The hospital will call you to come in for your induction. Contact information: [address] [place] [address] [phone]        [facility] Follow up.   Specialty: Obstetrics and Gynecology Why: As needed for emergencies Contact information: [address] [place] [address] [phone]               Allergies as of 12/09/2022       Reactions   Morphine And Codeine Other (See Comments)   Hot flashes, can't breathe   Tramadol Other (See Comments)   Hot flashes    Pitocin [oxytocin] Itching, Rash   Per patient it occurred the last time she was induced.        Medication List     TAKE these  medications    Accu-Chek Guide test strip Generic drug: glucose blood Use to check blood sugars four times a day was instructed.   Accu-Chek Guide w/Device Kit 1 Device by Does not apply route 4 (four) times daily.   Accu-Chek Softclix Lancets lancets Please use a different lancet 4 times a day to check blood sugar.   acetaminophen 325 MG tablet Commonly known as: Tylenol Take 2 tablets (650 mg total) by mouth every 6 (six) hours as needed (for pain scale < 4).   aspirin 81 MG chewable tablet Chew 1 tablet (81 mg total) by mouth daily.   cyclobenzaprine 5 MG tablet Commonly known as: FLEXERIL Take 1 tablet (5 mg total) by mouth 3 (three) times daily as needed for muscle spasms.   insulin glargine 100 unit/mL Sopn Commonly known as: LANTUS Inject 15 Units into the skin 2 (two) times daily.   multivitamin-prenatal 27-0.8 MG Tabs tablet Take 1 tablet by mouth daily at 12 noon.   Pen Needles 32G X 4 MM Misc Use with insulin pen for injections, as described   terbinafine 1 % cream Commonly known as: LAMISIL Apply 1 Application topically daily.        Sharen Counter Certified Nurse-Midwife 12/09/2022 2:19 AM

## 2022-12-10 ENCOUNTER — Inpatient Hospital Stay (HOSPITAL_COMMUNITY): Payer: Medicaid Other | Admitting: Anesthesiology

## 2022-12-10 LAB — GLUCOSE, CAPILLARY
Glucose-Capillary: 135 mg/dL — ABNORMAL HIGH (ref 70–99)
Glucose-Capillary: 124 mg/dL — ABNORMAL HIGH (ref 70–99)
Glucose-Capillary: 112 mg/dL — ABNORMAL HIGH (ref 70–99)
Glucose-Capillary: 133 mg/dL — ABNORMAL HIGH (ref 70–99)
Glucose-Capillary: 91 mg/dL (ref 70–99)
Glucose-Capillary: 105 mg/dL — ABNORMAL HIGH (ref 70–99)
Glucose-Capillary: 95 mg/dL (ref 70–99)

## 2022-12-10 LAB — WET PREP, GENITAL
Clue Cells Wet Prep HPF POC: NONE SEEN
Sperm: NONE SEEN
Trich, Wet Prep: NONE SEEN
WBC, Wet Prep HPF POC: <10 (ref <10)

## 2022-12-10 LAB — RPR: RPR Ser Ql: NONREACTIVE

## 2022-12-10 MED ORDER — FLUCONAZOLE 150 MG PO TABS
150.0000 mg | ORAL_TABLET | Freq: Once | ORAL | Status: AC
  Administered 2022-12-10: 150 mg via ORAL
  Filled 2022-12-10: qty 1

## 2022-12-10 MED ORDER — TERBUTALINE SULFATE 1 MG/ML IJ SOLN: 0.2500 mg | Freq: Once | INTRAMUSCULAR | Status: DC | PRN

## 2022-12-10 MED ORDER — EPHEDRINE 5 MG/ML INJ: 10.0000 mg | INTRAVENOUS | Status: DC | PRN

## 2022-12-10 MED ORDER — DIPHENHYDRAMINE HCL 50 MG/ML IJ SOLN
12.5000 mg | INTRAMUSCULAR | Status: DC | PRN
  Administered 2022-12-10 (×2): 12.5 mg via INTRAVENOUS
  Filled 2022-12-10: qty 1

## 2022-12-10 MED ORDER — PHENYLEPHRINE 80 MCG/ML (10ML) SYRINGE FOR IV PUSH (FOR BLOOD PRESSURE SUPPORT): 80.0000 ug | PREFILLED_SYRINGE | INTRAVENOUS | Status: DC | PRN

## 2022-12-10 MED ORDER — FENTANYL-BUPIVACAINE-NACL 0.5-0.125-0.9 MG/250ML-% EP SOLN
12.0000 mL/h | EPIDURAL | Status: DC | PRN
  Administered 2022-12-10 – 2022-12-11 (×2): 12 mL/h via EPIDURAL
  Filled 2022-12-10 (×2): qty 250

## 2022-12-10 MED ORDER — LACTATED RINGERS IV SOLN
500.0000 mL | Freq: Once | INTRAVENOUS | Status: AC
  Administered 2022-12-10: 500 mL via INTRAVENOUS

## 2022-12-10 MED ORDER — INSULIN ASPART 100 UNIT/ML IJ SOLN
0.0000 [IU] | INTRAMUSCULAR | Status: DC
  Administered 2022-12-10 (×3): 1 [IU] via SUBCUTANEOUS
  Administered 2022-12-10: 2 [IU] via SUBCUTANEOUS
  Administered 2022-12-11: 1 [IU] via SUBCUTANEOUS

## 2022-12-10 MED ORDER — OXYTOCIN-SODIUM CHLORIDE 30-0.9 UT/500ML-% IV SOLN: 1.0000 m[IU]/min | INTRAVENOUS | Status: DC

## 2022-12-10 MED ORDER — OXYTOCIN-SODIUM CHLORIDE 30-0.9 UT/500ML-% IV SOLN
1.0000 m[IU]/min | INTRAVENOUS | Status: DC
  Administered 2022-12-10: 2 m[IU]/min via INTRAVENOUS

## 2022-12-10 MED ORDER — LIDOCAINE HCL (PF) 1 % IJ SOLN
INTRAMUSCULAR | Status: DC | PRN
  Administered 2022-12-10 (×2): 5 mL via EPIDURAL

## 2022-12-10 NOTE — Progress Notes (Signed)
Patient comfortable with epidural and reports minimal pain. Patient agrees to AROM  Objective: BP 126/61   Pulse 100   Temp 97.6 F (36.4 C) (Oral)   Resp 16   Ht 5\' 3"  (1.6 m)   Wt 128 kg   LMP  (LMP Unknown) Comment: pt says she had  baby in 12-2021- no cycle since then  SpO2 96%   BMI 49.99 kg/m  No intake/output data recorded. No intake/output data recorded.  FHT:  baseline 140, mod variability, + accels, no decels UC:   regular, every 3 minutes SVE:   Dilation: 3 Effacement (%): 50 Station: -3 Exam by:: Dr. Ladean Raya  Labs: Lab Results  Component Value Date   WBC 11.6 (H) 12/09/2022   HGB 10.6 (L) 12/09/2022   HCT 33.4 (L) 12/09/2022   MCV 74.2 (L) 12/09/2022   PLT 237 12/09/2022    Assessment / Plan: Protracted latent phase  Labor: AROM clear fluid. Will continue with pitocin augmentation Fetal Wellbeing:  Category I Pain Control:  Epidural Anticipated MOD:  NSVD  Catalina Antigua, MD 12/10/2022, 5:45 PM

## 2022-12-10 NOTE — Progress Notes (Signed)
Patient ID: [name], female   DOB: Feb 04, 1996, 27 y.o.   MRN: [id number]  S/p cytotec x 3 doses; feels some cramping; agreeable to try foley placement  FHR 130-140s, +accels, occ mi variables Ctx irreg Cx 1/thick/vtx -3  Overnight CBGs: 124 and 135  IUP@38 .5wks A2GDM Hx cHTN IOL process w unfavorable cx  -Given a dose of Fentanyl for comfort, and foley attempted but it was too uncomfortable for her so we stopped the process -Discussed possibly being able to start with Pitocin at the next exam (~0910) versus an additional dose of cytotec -Will discuss any insulin changes needed at sign out  Leslie Skinner CNM 12/10/2022 7:07 AM

## 2022-12-10 NOTE — Progress Notes (Signed)
LABOR PROGRESS NOTE  Leslie Skinner is a 27 y.o. [phone] at [redacted]w[redacted]d  admitted for IOL for GDMA2 (poorly controlled), cHTN (no meds).  Subjective: Comfortable with epidural  Objective: BP 131/70   Pulse 96   Temp 97.6 F (36.4 C) (Oral)   Resp 16   Ht 5\' 3"  (1.6 m)   Wt 128 kg   LMP  (LMP Unknown) Comment: pt says she had  baby in 12-2021- no cycle since then  SpO2 99%   BMI 49.99 kg/m  or  Vitals:   12/10/22 1632 12/10/22 1633 12/10/22 1634 12/10/22 1639  BP: 117/60 117/60 117/60 131/70  Pulse: 88 88 88 96  Resp: 16 16  16   Temp:      TempSrc:      SpO2: 99%     Weight:      Height:        Physical Exam Constitutional:      General: She is not in acute distress.    Appearance: Normal appearance. She is not ill-appearing.  HENT:     Head: Atraumatic.  Eyes:     General: No scleral icterus.    Conjunctiva/sclera: Conjunctivae normal.  Pulmonary:     Effort: Pulmonary effort is normal.  Skin:    General: Skin is warm and dry.     Coloration: Skin is not jaundiced or pale.  Neurological:     Mental Status: She is alert.     Coordination: Coordination normal.  Psychiatric:        Mood and Affect: Mood normal.        Behavior: Behavior normal.     Dilation: 3 Effacement (%): 30 Cervical Position: Posterior Station: -3 Presentation: Vertex Exam by:: Dr Cherlynn Perches Baseline: 145 bpm Variability: Good {> 6 bpm) Accelerations: Reactive Decelerations: Absent Uterine activity: rare Cat: I  Labs: Lab Results  Component Value Date   WBC 11.6 (H) 12/09/2022   HGB 10.6 (L) 12/09/2022   HCT 33.4 (L) 12/09/2022   MCV 74.2 (L) 12/09/2022   PLT 237 12/09/2022    Patient Active Problem List   Diagnosis Date Noted   Encounter for induction of labor 12/09/2022   Group B Streptococcus carrier, +RV culture, currently pregnant 12/03/2022   Gestational diabetes mellitus (GDM) affecting pregnancy-Insulin 09/25/2022   BMI 45.0-49.9, adult (HCC) 06/25/2022    Hx of shoulder dystocia in prior pregnancy, currently pregnant 06/25/2022   Chronic hypertension affecting pregnancy 12/29/2021   Maternal morbid obesity, antepartum (HCC) 06/15/2021   Previous pregnancy with congenital heart defect, currently pregnant (TOF) 06/15/2021   Supervision of high risk pregnancy, antepartum 05/31/2021    Assessment / Plan: 27 y.o. G4P3003 at [redacted]w[redacted]d here for IOL for GDMA2 (poorly controlled), cHTN (no meds).  Labor: now 3/Thick/-3. Discussed plan at length with patient. Recommended AROM and pitocin. Patient initially very hesitant about AROM, after exploring this she reports she thought that she would have to have a c section if her water was broken for too long. Discussed this is not the case. Also discussed pitocin but do not believe this will progress her labor as quickly. She would like a few minutes to consider and then we will check back with her. Fetal Wellbeing:  cat I Pain Control:  epidural GBS: positive, on penicillin Anticipated MOD:  NSVD  cHTN: mostly normotensive, no meds, ctm  GDMA2: sugars have been <140, cont lantus 15u BID and sliding scale insulin  Venora Maples, MD/MPH Attending Family Medicine Physician, Faculty Practice  [facility] for [employer], [facility]   12/10/2022, 4:50 PM

## 2022-12-10 NOTE — Anesthesia Procedure Notes (Signed)
Epidural Patient location during procedure: OB Start time: 12/10/2022 3:50 PM End time: 12/10/2022 3:55 PM  Staffing Anesthesiologist: Inwood Nation, MD Performed: anesthesiologist   Preanesthetic Checklist Completed: patient identified, IV checked, risks and benefits discussed, monitors and equipment checked, pre-op evaluation and timeout performed  Epidural Patient position: sitting Prep: DuraPrep Patient monitoring: heart rate, cardiac monitor, continuous pulse ox and blood pressure Approach: midline Location: L3-L4 Injection technique: LOR air  Needle:  Needle type: Tuohy  Needle gauge: 17 G Needle length: 9 cm Needle insertion depth: 10 cm Catheter type: closed end flexible Catheter size: 19 Gauge Catheter at skin depth: 15 cm Test dose: negative  Assessment Sensory level: T8  Additional Notes Patient identified. Risks/Benefits/Options discussed with patient including but not limited to bleeding, infection, nerve damage, paralysis, failed block, incomplete pain control, headache, blood pressure changes, nausea, vomiting, reactions to medication both or allergic, itching and postpartum back pain. Confirmed with bedside nurse the patient's most recent platelet count. Confirmed with patient that they are not currently taking any anticoagulation, have any bleeding history or any family history of bleeding disorders. Patient expressed understanding and wished to proceed. All questions were answered. Sterile technique was used throughout the entire procedure. Please see nursing notes for vital signs. Test dose was given through epidural catheter and negative prior to continuing to dose epidural or start infusion. Warning signs of high block given to the patient including shortness of breath, tingling/numbness in hands, complete motor block, or any concerning symptoms with instructions to call for help. Patient was given instructions on fall risk and not to get out of bed. All questions  and concerns addressed with instructions to call with any issues or inadequate analgesia.  Reason for block:procedure for pain

## 2022-12-10 NOTE — Progress Notes (Addendum)
Patient ID: Leslie Skinner, female   DOB: May 03, 1995, 27 y.o.   MRN: [id number]  S/p cytotec x 4 doses; feels some cramping; agreeable to try foley placement  FHR 130-140s, +accels, no decels Ctx irreg Cx 1/thick/vtx -3, FB placed with speculum guidance with some difficulty due to patient's discomfort and habitus.  Inflated with 40 cc of fluid.   CBG (last 3)  Recent Labs    12/10/22 0154 12/10/22 0545 12/10/22 0929  GLUCAP 135* 124* 112*    IUP@38 .5wks A2GDM Hx cHTN  - Foley placed, will continue cytotec as per protocol for now.  - Category 1 FHR tracing. - CBGs less than 140, continue Lantus as ordered for now. - Continue close monitoring.   Jaynie Collins, MD 12/10/2022 11:19 AM

## 2022-12-10 NOTE — Anesthesia Preprocedure Evaluation (Signed)
Anesthesia Evaluation  Patient identified by MRN, date of birth, ID band Patient awake    Reviewed: Allergy & Precautions, H&P , NPO status , Patient's Chart, lab work & pertinent test results  History of Anesthesia Complications (+) POST - OP SPINAL HEADACHE and history of anesthetic complications  Airway Mallampati: III  TM Distance: >3 FB Neck ROM: Full    Dental no notable dental hx.    Pulmonary neg pulmonary ROS   Pulmonary exam normal breath sounds clear to auscultation       Cardiovascular hypertension, Normal cardiovascular exam Rhythm:Regular Rate:Normal     Neuro/Psych  Headaches PSYCHIATRIC DISORDERS Anxiety Depression       GI/Hepatic Neg liver ROS,GERD  ,,  Endo/Other  negative endocrine ROSdiabetes    Renal/GU negative Renal ROS  negative genitourinary   Musculoskeletal negative musculoskeletal ROS (+)    Abdominal   Peds negative pediatric ROS (+)  Hematology  (+) Blood dyscrasia, anemia Plt 237   Anesthesia Other Findings gDM  Reproductive/Obstetrics (+) Pregnancy                              Anesthesia Physical Anesthesia Plan  ASA: 3  Anesthesia Plan: Epidural   Post-op Pain Management:    Induction:   PONV Risk Score and Plan: Treatment may vary due to age or medical condition  Airway Management Planned: Natural Airway  Additional Equipment:   Intra-op Plan:   Post-operative Plan:   Informed Consent: I have reviewed the patients History and Physical, chart, labs and discussed the procedure including the risks, benefits and alternatives for the proposed anesthesia with the patient or authorized representative who has indicated his/her understanding and acceptance.       Plan Discussed with: Anesthesiologist  Anesthesia Plan Comments: (Patient identified. Risks, benefits, options discussed with patient including but not limited to bleeding,  infection, nerve damage, paralysis, failed block, incomplete pain control, headache, blood pressure changes, nausea, vomiting, reactions to medication, itching, and post partum back pain. Confirmed with bedside nurse the patient's most recent platelet count. Confirmed with the patient that they are not taking any anticoagulation, have any bleeding history or any family history of bleeding disorders. Patient expressed understanding and wishes to proceed. All questions were answered. )         Anesthesia Quick Evaluation

## 2022-12-10 NOTE — Progress Notes (Signed)
Patient Vitals for the past 4 hrs:  BP Temp Temp src Pulse Resp  12/10/22 2300 121/60 98 F (36.7 C) -- (!) 101 17  12/10/22 2230 (!) 132/50 -- -- 100 17  12/10/22 2200 (!) 114/53 -- -- (!) 104 --  12/10/22 2130 (!) 109/45 -- -- 99 17  12/10/22 2100 (!) 106/56 -- -- 93 --  12/10/22 2049 -- 98.2 F (36.8 C) Oral -- --  12/10/22 2030 (!) 111/50 -- -- (!) 101 --  12/10/22 2000 (!) 111/55 -- -- (!) 105 17  12/10/22 1930 (!) 106/48 -- -- 90 --   BS <100  Comfortable w/epidural.  Cx 3/70/-3.  IUPC placed.  Pitocin at 10 mu/min.  FHR Cat 1. TItrate pitocin until labor adequate

## 2022-12-11 DIAGNOSIS — O1002 Pre-existing essential hypertension complicating childbirth: Secondary | ICD-10-CM

## 2022-12-11 DIAGNOSIS — O99214 Obesity complicating childbirth: Secondary | ICD-10-CM

## 2022-12-11 DIAGNOSIS — Z3A38 38 weeks gestation of pregnancy: Secondary | ICD-10-CM

## 2022-12-11 DIAGNOSIS — O99820 Streptococcus B carrier state complicating pregnancy: Secondary | ICD-10-CM

## 2022-12-11 DIAGNOSIS — O24424 Gestational diabetes mellitus in childbirth, insulin controlled: Secondary | ICD-10-CM

## 2022-12-11 LAB — GLUCOSE, CAPILLARY
Glucose-Capillary: 80 mg/dL (ref 70–99)
Glucose-Capillary: 87 mg/dL (ref 70–99)
Glucose-Capillary: 94 mg/dL (ref 70–99)
Glucose-Capillary: 104 mg/dL — ABNORMAL HIGH (ref 70–99)

## 2022-12-11 MED ORDER — ONDANSETRON HCL 4 MG PO TABS: 4.0000 mg | ORAL_TABLET | ORAL | Status: DC | PRN

## 2022-12-11 MED ORDER — SIMETHICONE 80 MG PO CHEW: 80.0000 mg | CHEWABLE_TABLET | ORAL | Status: DC | PRN

## 2022-12-11 MED ORDER — ZOLPIDEM TARTRATE 5 MG PO TABS: 5.0000 mg | ORAL_TABLET | Freq: Every evening | ORAL | Status: DC | PRN

## 2022-12-11 MED ORDER — SENNOSIDES-DOCUSATE SODIUM 8.6-50 MG PO TABS
2.0000 | ORAL_TABLET | Freq: Every day | ORAL | Status: DC
  Administered 2022-12-12 – 2022-12-13 (×2): 2 via ORAL
  Filled 2022-12-11 (×2): qty 2

## 2022-12-11 MED ORDER — PRENATAL MULTIVITAMIN CH
1.0000 | ORAL_TABLET | Freq: Every day | ORAL | Status: DC
  Administered 2022-12-12 – 2022-12-13 (×2): 1 via ORAL
  Filled 2022-12-11 (×2): qty 1

## 2022-12-11 MED ORDER — OXYCODONE HCL 5 MG PO TABS
10.0000 mg | ORAL_TABLET | ORAL | Status: DC | PRN
  Administered 2022-12-12: 10 mg via ORAL
  Filled 2022-12-11: qty 2

## 2022-12-11 MED ORDER — FUROSEMIDE 20 MG PO TABS
20.0000 mg | ORAL_TABLET | Freq: Every day | ORAL | Status: DC
  Administered 2022-12-12 – 2022-12-13 (×2): 20 mg via ORAL
  Filled 2022-12-11 (×2): qty 1

## 2022-12-11 MED ORDER — WITCH HAZEL-GLYCERIN EX PADS: 1.0000 | MEDICATED_PAD | CUTANEOUS | Status: DC | PRN

## 2022-12-11 MED ORDER — NIFEDIPINE ER OSMOTIC RELEASE 30 MG PO TB24
30.0000 mg | ORAL_TABLET | Freq: Every day | ORAL | Status: DC
  Administered 2022-12-11 – 2022-12-13 (×3): 30 mg via ORAL
  Filled 2022-12-11 (×4): qty 1

## 2022-12-11 MED ORDER — ACETAMINOPHEN 325 MG PO TABS
650.0000 mg | ORAL_TABLET | ORAL | Status: DC | PRN
  Administered 2022-12-11 – 2022-12-13 (×3): 650 mg via ORAL
  Filled 2022-12-11 (×3): qty 2

## 2022-12-11 MED ORDER — BENZOCAINE-MENTHOL 20-0.5 % EX AERO
1.0000 | INHALATION_SPRAY | CUTANEOUS | Status: DC | PRN
  Administered 2022-12-13: 1 via TOPICAL
  Filled 2022-12-11: qty 56

## 2022-12-11 MED ORDER — ONDANSETRON HCL 4 MG/2ML IJ SOLN: 4.0000 mg | INTRAMUSCULAR | Status: DC | PRN

## 2022-12-11 MED ORDER — COCONUT OIL OIL: 1.0000 | TOPICAL_OIL | Status: DC | PRN

## 2022-12-11 MED ORDER — DIPHENHYDRAMINE HCL 25 MG PO CAPS: 25.0000 mg | ORAL_CAPSULE | Freq: Four times a day (QID) | ORAL | Status: DC | PRN

## 2022-12-11 MED ORDER — IBUPROFEN 600 MG PO TABS
600.0000 mg | ORAL_TABLET | Freq: Four times a day (QID) | ORAL | Status: DC
  Administered 2022-12-11 – 2022-12-13 (×8): 600 mg via ORAL
  Filled 2022-12-11 (×8): qty 1

## 2022-12-11 MED ORDER — DIBUCAINE (PERIANAL) 1 % EX OINT: 1.0000 | TOPICAL_OINTMENT | CUTANEOUS | Status: DC | PRN

## 2022-12-11 MED ORDER — OXYCODONE HCL 5 MG PO TABS
5.0000 mg | ORAL_TABLET | ORAL | Status: DC | PRN
  Administered 2022-12-11: 5 mg via ORAL
  Filled 2022-12-11: qty 1

## 2022-12-11 MED ORDER — TETANUS-DIPHTH-ACELL PERTUSSIS 5-2.5-18.5 LF-MCG/0.5 IM SUSY: 0.5000 mL | PREFILLED_SYRINGE | Freq: Once | INTRAMUSCULAR | Status: DC

## 2022-12-11 NOTE — Progress Notes (Signed)
Patient Vitals for the past 4 hrs:  BP Temp Temp src Pulse Resp  12/11/22 0312 129/67 98 F (36.7 C) Oral 94 --  12/11/22 0232 127/62 -- -- 96 --  12/11/22 0202 (!) 116/58 -- -- 88 17  12/11/22 0132 (!) 116/53 -- -- 90 --  12/11/22 0102 (!) 116/53 -- -- 95 17  12/11/22 0032 119/62 -- -- (!) 105 --  12/11/22 0031 -- 98.1 F (36.7 C) Oral -- --  12/11/22 0000 (!) 115/49 -- -- 97 --  .BS 80  Cx 4/90/-2.  FHR Cat 1 MVUs 170-180.  Pitocin at 39mu/min.  Continue to titrate pitocin up to adequate labor, position changes to facilitate descent.

## 2022-12-11 NOTE — Inpatient Diabetes Management (Signed)
Inpatient Diabetes Program Recommendations  Diabetes Treatment Program Recommendations  ADA Standards of Care Diabetes in Pregnancy Target Glucose Ranges:  Fasting: 70 - 95 mg/dL 1 hr postprandial: Less than 140mg /dL (from first bite of meal) 2 hr postprandial: Less than 120 mg/dL (from first bite of meal)    Lab Results  Component Value Date   GLUCAP 87 12/11/2022   HGBA1C CANCELED 06/25/2022   HGBA1C 5.6 06/25/2022    Review of Glycemic Control  Latest Reference Range & Units 12/10/22 23:08 12/11/22 03:12 12/11/22 07:32  Glucose-Capillary 70 - 99 mg/dL 95 80 87   Diabetes history: GDM Outpatient Diabetes medications: Lantus 15 units BID Current orders for Inpatient glycemic control: Novolog 0-14 units Q4H, Lantus 15 units BID  Inpatient Diabetes Program Recommendations:    Noted patient for IOL. Consider discontinuing Lantus 15 units BID.   Thanks, Lujean Rave, MSN, RNC-OB Diabetes Coordinator [phone] (8a-5p)

## 2022-12-11 NOTE — Discharge Summary (Signed)
Postpartum Discharge Summary  Date of Service updated***     Patient Name: Leslie Skinner DOB: Jul 19, 1995 MRN: [id number]  Date of admission: 12/09/2022 Delivery date:12/11/2022 Delivering provider: Dorathy Kinsman Date of discharge: 12/11/2022  Admitting diagnosis: Encounter for induction of labor [Z34.90] Intrauterine pregnancy: [redacted]w[redacted]d     Secondary diagnosis:  Principal Problem:   SVD (spontaneous vaginal delivery) Active Problems:   Maternal morbid obesity, antepartum (HCC)   Chronic hypertension affecting pregnancy   Hx of shoulder dystocia in prior pregnancy, currently pregnant   Gestational diabetes mellitus (GDM) affecting pregnancy-Insulin   Group B Streptococcus carrier, +RV culture, currently pregnant   Encounter for induction of labor  Additional problems: ***    Discharge diagnosis: Term Pregnancy Delivered, CHTN, and GDM A2                                              Post partum procedures:{Postpartum procedures:23558} Augmentation: AROM, Pitocin, Cytotec, and IP Foley Complications: ROM>24 hours  Hospital course: Induction of Labor With Vaginal Delivery   27 y.o. yo [phone] at [redacted]w[redacted]d was admitted to the hospital 12/09/2022 for induction of labor.  Indication for induction: A2 DM.  Patient had an labor course complicated by nothing.  Membrane Rupture Time/Date: 5:40 PM,12/10/2022  Delivery Method:Vaginal, Spontaneous Operative Delivery:N/A Episiotomy: None Lacerations:  None Details of delivery can be found in separate delivery note.  Patient had a postpartum course complicated by***. Patient is discharged home 12/11/22.  Newborn Data: Birth date:12/11/2022 Birth time:6:10 PM Gender:Female Living status:Living Apgars:9 ,  Weight:3450 g  Magnesium Sulfate received: No BMZ received: No Rhophylac:N/A MMR:N/A T-DaP:{Tdap:23962} Flu: {JYN:82956} RSV Vaccine received: No Transfusion:{Transfusion received:30440034}  Immunizations  received: Immunization History  Administered Date(s) Administered   Influenza Split 04/30/2011   Influenza,inj,Quad PF,6+ Mos 12/13/2014, 04/02/2017   Meningococcal Conjugate 10/17/2010   PPD Test 05/10/2014, 07/11/2015, 12/06/2017   Tdap 08/26/2017    Physical exam  Vitals:   12/11/22 1801 12/11/22 1828 12/11/22 1847 12/11/22 1900  BP: (!) 151/74 (!) 125/55 (!) 153/72 (!) 150/75  Pulse: (!) 115 (!) 103 (!) 114 (!) 115  Resp: 18 18    Temp:  98.1 F (36.7 C)    TempSrc:  Axillary    SpO2:      Weight:      Height:       General: {Exam; general:21111117} Lochia: {Desc; appropriate/inappropriate:30686::"appropriate"} Uterine Fundus: {Desc; firm/soft:30687} Incision: {Exam; incision:21111123} DVT Evaluation: {Exam; dvt:2111122} Labs: Lab Results  Component Value Date   WBC 11.6 (H) 12/09/2022   HGB 10.6 (L) 12/09/2022   HCT 33.4 (L) 12/09/2022   MCV 74.2 (L) 12/09/2022   PLT 237 12/09/2022      Latest Ref Rng & Units 09/19/2022   11:28 AM  CMP  Glucose 70 - 99 mg/dL 213   BUN 6 - 20 mg/dL 8   Creatinine 0.86 - 5.78 mg/dL 4.69   Sodium 629 - 528 mmol/L 136   Potassium 3.5 - 5.2 mmol/L 3.9   Chloride 96 - 106 mmol/L 100   CO2 20 - 29 mmol/L 21   Calcium 8.7 - 10.2 mg/dL 9.3   Total Protein 6.0 - 8.5 g/dL 6.7   Total Bilirubin 0.0 - 1.2 mg/dL 0.3   Alkaline Phos 44 - 121 IU/L 145   AST 0 - 40 IU/L 13   ALT 0 - 32 IU/L  11    Edinburgh Score:    02/19/2022   10:42 AM  Inocente Salles Postnatal Depression Scale Screening Tool  I have been able to laugh and see the funny side of things. 0  I have looked forward with enjoyment to things. 0  I have blamed myself unnecessarily when things went wrong. 0  I have been anxious or worried for no good reason. 0  I have felt scared or panicky for no good reason. 0  Things have been getting on top of me. 0  I have been so unhappy that I have had difficulty sleeping. 0  I have felt sad or miserable. 0  I have been so unhappy  that I have been crying. 0  The thought of harming myself has occurred to me. 0  Edinburgh Postnatal Depression Scale Total 0   No data recorded  After visit meds:  Allergies as of 12/11/2022       Reactions   Morphine And Codeine Shortness Of Breath, Other (See Comments)   Hot flashes Difficulty breathing   Ultram [tramadol] Other (See Comments)   Hot flashes    Pitocin [oxytocin] Itching, Rash     Med Rec must be completed prior to using this SMARTLINK***        Discharge home in stable condition Infant Feeding: Breast Infant Disposition:{CHL IP OB HOME WITH IONGEX:52841} Discharge instruction: per After Visit Summary and Postpartum booklet. Activity: Advance as tolerated. Pelvic rest for 6 weeks.  Diet: {OB LKGM:01027253} Future Appointments:No future appointments. Follow up Visit:   Please schedule this patient for a In person postpartum visit in 6 weeks with the following provider: Any provider. Additional Postpartum F/U:2 hour GTT and BP check 2-3 days  High risk pregnancy complicated by: GDM and HTN Delivery mode:  Vaginal, Spontaneous Anticipated Birth Control:  Unsure   12/11/2022 Dorathy Kinsman, CNM

## 2022-12-11 NOTE — Progress Notes (Signed)
Patient requiring sliding scale insulin based on CBG of 104. Pharmacy contacted approximately 30 minutes ago and still waiting for insulin to be sent from pharmacy. Will administer as soon as insulin received from pharmacy.

## 2022-12-11 NOTE — Lactation Note (Addendum)
This note was copied from a baby's chart. Lactation Consultation Note  Patient Name: Girl Shambhavi Salley [id number] Date: 12/11/2022 Age:29 hours Reason for consult: Initial assessment;Early term 37-38.6wks;Maternal endocrine disorder.  Birth Parent feeding choice is breast and formula feeding infant.  P4, ETI female infant, Per Birth Parent,  infant latched earlier but it  was painful, she had discomfort with other children and stopped BF after 2 days. She unsure about latching infant will try again later. Infant asleep did not want latch infant at this time. Birth Parent open to pumping, she wants to give infant her breast milk and to continue to try latching infant at the breast. [facility] set Birth Parent up with 21 mm Breast flange and Birth Parent was pumping as LC left the room. LC discussed maternal rest, diet and hydration. Infant input and output. Birth Parent was  made aware of O/P services, breastfeeding support groups, community resources, and our phone # for post-discharge questions.    Current feeding plan: 1- Birth Parent will BF infant by cues, on demand, every 2-3 hours, skin to skin. Continue to ask for latch  assitance from RN/ LC. 2- Afterwards Birth Parent will continue to supplement infant with formula this is her feeding choice. 3- Continue to use DEBP every 3 hours for 15 minutes on initial setting and offer infant any EBM that is pumped before formula.   Maternal Data Has patient been taught Hand Expression?: Yes Does the patient have breastfeeding experience prior to this delivery?: Yes How long did the patient breastfeed?: Per Birth Parent, she breifly BF first child 1 day, 3rd child same had alot painful latches.  Feeding Mother's Current Feeding Choice: Breast Milk and Formula Nipple Type: Slow - flow  LATCH Score LC did not observe latch at this time while in room, Birth Parent plans to wait due having use restroom  Infant asleep in basinet at this time.   Lactation  Tools Discussed/Used Tools: Pump Breast pump type: Double-Electric Breast Pump Pump Education: Setup, frequency, and cleaning;Milk Storage Reason for Pumping: Birth Parent want to pump thinking about latching infant, latched infant to breast earlier and was painful. Pumping frequency: Birth Parent will continue to use DEBP every 3 hours for 15 minutes on inital setting.  Interventions Interventions: Breast feeding basics reviewed;Skin to skin;Expressed milk;Education;Guidelines for Milk Supply and Pumping Schedule Handout;LC Services brochure;CDC Guidelines for Breast Pump Cleaning  Discharge Pump: DEBP;Personal  Consult Status Consult Status: Follow-up Date: 12/12/22 Follow-up type: In-patient    Frederico Hamman 12/11/2022, 10:20 PM

## 2022-12-11 NOTE — Progress Notes (Signed)
Leslie Skinner is a 27 y.o. G4P3003 at [redacted]w[redacted]d.  Subjective: Comfortable w/ epidural.   Objective: BP 131/67   Pulse (!) 101   Temp 97.9 F (36.6 C) (Oral)   Resp 18   Ht 5\' 3"  (1.6 m)   Wt 128 kg   LMP  (LMP Unknown) Comment: pt says she had  baby in 12-2021- no cycle since then  SpO2 96%   BMI 49.99 kg/m    FHT:  FHR: 140 bpm, variability: mod,  accelerations:  15x15. Pos SS,  decelerations:  few lates, variables UC:   Q 2-4 minutes, MVU's 140-175 Dilation: 7.5 Effacement (%): 90 Cervical Position: Posterior Station: -2 Presentation: Vertex Exam by:: Daissy Yerian, cnm  Labs: Results for orders placed or performed during the hospital encounter of 12/09/22 (from the past 24 hour(s))  Wet prep, genital     Status: Abnormal   Collection Time: 12/10/22 11:08 AM  Result Value Ref Range   Yeast Wet Prep HPF POC PRESENT (A) NONE SEEN   Trich, Wet Prep NONE SEEN NONE SEEN   Clue Cells Wet Prep HPF POC NONE SEEN NONE SEEN   WBC, Wet Prep HPF POC <10 <10   Sperm NONE SEEN   Glucose, capillary     Status: Abnormal   Collection Time: 12/10/22  2:21 PM  Result Value Ref Range   Glucose-Capillary 133 (H) 70 - 99 mg/dL  Glucose, capillary     Status: None   Collection Time: 12/10/22  6:53 PM  Result Value Ref Range   Glucose-Capillary 91 70 - 99 mg/dL  Glucose, capillary     Status: Abnormal   Collection Time: 12/10/22 10:26 PM  Result Value Ref Range   Glucose-Capillary 105 (H) 70 - 99 mg/dL  Glucose, capillary     Status: None   Collection Time: 12/10/22 11:08 PM  Result Value Ref Range   Glucose-Capillary 95 70 - 99 mg/dL  Glucose, capillary     Status: None   Collection Time: 12/11/22  3:12 AM  Result Value Ref Range   Glucose-Capillary 80 70 - 99 mg/dL  Glucose, capillary     Status: None   Collection Time: 12/11/22  7:32 AM  Result Value Ref Range   Glucose-Capillary 87 70 - 99 mg/dL    Assessment / Plan: [redacted]w[redacted]d week IUP Labor: Active, progressing well on  pitocon Fetal Wellbeing:  Category I-II, occasional lates, variables but not recurrent. Reassuring that there is mod variability and accels.  Pain Control:  Epidural Anticipated MOD:  SVD .A2GDM: Controlled on Lantus 15 BID CHTN: Mildly elevated w/out meds. No evidence of Pre-E.   [name], [place], CNM 12/11/2022 9:44 AM

## 2022-12-12 LAB — GLUCOSE, CAPILLARY
Glucose-Capillary: 152 mg/dL — ABNORMAL HIGH (ref 70–99)
Glucose-Capillary: 141 mg/dL — ABNORMAL HIGH (ref 70–99)
Glucose-Capillary: 80 mg/dL (ref 70–99)
Glucose-Capillary: 157 mg/dL — ABNORMAL HIGH (ref 70–99)

## 2022-12-12 LAB — BIRTH TISSUE RECOVERY COLLECTION (PLACENTA DONATION)

## 2022-12-12 NOTE — Anesthesia Postprocedure Evaluation (Signed)
Anesthesia Post Note  Patient: Leslie Skinner  Procedure(s) Performed: AN AD HOC LABOR EPIDURAL     Patient location during evaluation: Mother Baby Anesthesia Type: Epidural Level of consciousness: awake, oriented and awake and alert Pain management: pain level controlled Vital Signs Assessment: post-procedure vital signs reviewed and stable Respiratory status: spontaneous breathing, respiratory function stable and nonlabored ventilation Cardiovascular status: stable Postop Assessment: no headache, adequate PO intake, able to ambulate, patient able to bend at knees and no apparent nausea or vomiting Anesthetic complications: no   No notable events documented.  Last Vitals:  Vitals:   12/12/22 0820 12/12/22 0825  BP: (!) 140/97 130/73  Pulse:    Resp: 20   Temp: 36.7 C   SpO2:      Last Pain:  Vitals:   12/12/22 0820  TempSrc: Oral  PainSc: 6    Pain Goal: Patients Stated Pain Goal: 0 (12/09/22 2238)              Epidural/Spinal Function Cutaneous sensation: Normal sensation (12/12/22 0820)  Ekaterini Capitano

## 2022-12-12 NOTE — Progress Notes (Signed)
Post Partum Day 1 Subjective: No complaints, up ad lib, voiding, and tolerating PO.  Baby is stable at bedside.   Objective: Blood pressure 130/73, pulse 91, temperature 98 F (36.7 C), temperature source Oral, resp. rate 20, height 5\' 3"  (1.6 m), weight 128 kg, SpO2 98%, currently breastfeeding.  Physical Exam:  General: alert and no distress Lochia: appropriate Uterine Fundus: firm DVT Evaluation: No evidence of DVT seen on physical exam. Negative Homan's sign. No cords or calf tenderness. No significant calf/ankle edema.  Recent Labs    12/09/22 1847  HGB 10.6*  HCT 33.4*    Assessment/Plan: Continue Procardia Xl 30 mg every day for [facility], also ordered for Lasix.  Adjust regimen as needed. Breast and bottle feeding, considering progestin methods for contraception. Routine postpartum care.   LOS: 3 days   Jaynie Collins, MD 12/12/2022, 12:13 PM

## 2022-12-13 ENCOUNTER — Encounter: Payer: Medicaid Other | Admitting: Obstetrics and Gynecology

## 2022-12-13 ENCOUNTER — Other Ambulatory Visit (HOSPITAL_COMMUNITY): Payer: Self-pay

## 2022-12-13 DIAGNOSIS — Z30017 Encounter for initial prescription of implantable subdermal contraceptive: Secondary | ICD-10-CM

## 2022-12-13 LAB — GLUCOSE, CAPILLARY: Glucose-Capillary: 83 mg/dL (ref 70–99)

## 2022-12-13 MED ORDER — ETONOGESTREL 68 MG ~~LOC~~ IMPL
68.0000 mg | DRUG_IMPLANT | Freq: Once | SUBCUTANEOUS | Status: AC
  Administered 2022-12-13: 68 mg via SUBCUTANEOUS
  Filled 2022-12-13: qty 1

## 2022-12-13 MED ORDER — LIDOCAINE HCL 1 % IJ SOLN
0.0000 mL | Freq: Once | INTRAMUSCULAR | Status: DC | PRN
Start: 2022-12-13 — End: 2022-12-13

## 2022-12-13 MED ORDER — ACETAMINOPHEN 325 MG PO TABS
650.0000 mg | ORAL_TABLET | ORAL | 0 refills | Status: DC | PRN
  Filled 2022-12-13: qty 100, 9d supply, fill #0

## 2022-12-13 MED ORDER — ETONOGESTREL 68 MG ~~LOC~~ IMPL
68.0000 mg | DRUG_IMPLANT | Freq: Once | SUBCUTANEOUS | 0 refills | Status: DC
Start: 2022-12-13 — End: 2023-09-01
  Filled 2022-12-13: qty 1, 1d supply, fill #0

## 2022-12-13 MED ORDER — LIDOCAINE HCL 1 % IJ SOLN
0.0000 mL | Freq: Once | INTRAMUSCULAR | Status: DC | PRN
  Filled 2022-12-13: qty 20

## 2022-12-13 MED ORDER — ETONOGESTREL 68 MG ~~LOC~~ IMPL
68.0000 mg | DRUG_IMPLANT | Freq: Once | SUBCUTANEOUS | Status: DC
Start: 2022-12-13 — End: 2022-12-13

## 2022-12-13 MED ORDER — NIFEDIPINE ER 30 MG PO TB24
30.0000 mg | ORAL_TABLET | Freq: Every day | ORAL | 1 refills | Status: DC
  Filled 2022-12-13: qty 30, 30d supply, fill #0

## 2022-12-13 MED ORDER — FUROSEMIDE 20 MG PO TABS
20.0000 mg | ORAL_TABLET | Freq: Every day | ORAL | 0 refills | Status: DC
Start: 2022-12-14 — End: 2023-09-01
  Filled 2022-12-13: qty 7, 7d supply, fill #0

## 2022-12-13 MED ORDER — IBUPROFEN 600 MG PO TABS
600.0000 mg | ORAL_TABLET | Freq: Four times a day (QID) | ORAL | 0 refills | Status: DC
  Filled 2022-12-13: qty 30, 8d supply, fill #0

## 2022-12-13 NOTE — Procedures (Signed)
Leslie Skinner is a 27 y.o. [phone] scheduled for inpatient postpartum nexplanon insertion.     Nexplanon Insertion Procedure Patient identified, informed consent performed, consent signed.   Patient does understand that irregular bleeding is a very common side effect of this medication. Appropriate time out taken.  Patient's left arm was prepped and draped in the usual sterile fashion.. The ruler used to measure and mark insertion area.  Patient was prepped with alcohol swab and then injected with 3 ml of 1% lidocaine.  She was prepped with betadine, Nexplanon removed from packaging,  Device confirmed in needle, then inserted full length of needle and withdrawn per handbook instructions. Nexplanon was able to palpated in the patient's arm; patient palpated the insert herself. There was minimal blood loss.  Patient insertion site covered with guaze and a pressure bandage to reduce any bruising.  The patient tolerated the procedure well and was given post procedure instructions.   Federico Flake, MD [facility] [employer], [facility]

## 2022-12-13 NOTE — Progress Notes (Addendum)
CSW received consult for hx of Anxiety,Depression and Edinburgh score of 13.  CSW met with MOB to offer support and complete assessment. CSW entered the room, introduced herself and acknowledged her family was present. MOB gave CSW verbal permission to speak about anything while they were present. CSW explained her role and the reason for the visit. MOB was polite, easy to engage, receptive to meeting with CSW, and appeared forthcoming.  CSW acknowledged Edinburgh score of 13 and listened to MOB explore her feelings about transitioning into motherhood. Patient requests a referral to Integrated Behavioral Health. Patient verbalizes understanding that the appointment will be virtual.CSW inquired about MOB's mental health history. MOB reported being diagnosed with anxiety and depression; and was participating in therapy with Hulda Marin; however she found the support not helpful, due to the visits being virtual. CSW provided [place] resources [place]. 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 MOB reported being able to get outside was helpful to improve her overall mental health. MOB reported currently feeling "good" and bonded well with the infant. MOB reported her supports as FOB and her family. CSW provided education regarding the baby blues period vs. perinatal mood disorders, discussed treatment and gave resources for mental health follow up if concerns arise.  CSW recommends self-evaluation during the postpartum time period using the New Mom Checklist from Postpartum Progress and encouraged MOB to contact a medical professional if symptoms are noted at any time.  CSW assessed for safety with MOB SI and HI; MOB denied all. CSW did not assess for DV; FOB was present.   CSW asked MOB has she selected a pediatrician for the infant's follow up visits; MOB said [facility]. MOB reported having  all essential items for the infant including a carseat, bassinet and crib for safe sleeping. CSW provided review of Sudden Infant Death Syndrome (SIDS) precautions.     CSW identifies no further need for intervention and no barriers to discharge at this time.   Enos Fling, Theresia Majors Clinical Social Worker [phone]

## 2022-12-17 ENCOUNTER — Ambulatory Visit: Payer: Medicaid Other

## 2022-12-17 ENCOUNTER — Telehealth (HOSPITAL_COMMUNITY): Payer: Self-pay | Admitting: *Deleted

## 2022-12-17 DIAGNOSIS — Z1331 Encounter for screening for depression: Secondary | ICD-10-CM

## 2022-12-17 NOTE — Telephone Encounter (Signed)
Hospital inpatient EPDS=13. Patient answered "never" to question #10. CSW consult completed during stay. Ambulatory IBH referral made now, per protocol, and Dr. Donavan Foil notified via chart. Paulene Floor, RN, 12/17/22, [phone]

## 2023-01-07 ENCOUNTER — Telehealth (HOSPITAL_COMMUNITY): Payer: Self-pay | Admitting: *Deleted

## 2023-01-07 NOTE — Telephone Encounter (Signed)
01/07/2023  Name: Leslie Skinner MRN: [id number] DOB: Apr 04, 1995  Reason for Call:  Transition of Care Hospital Discharge Call  Contact Status: Patient Contact Status: Complete  Language assistant needed: Interpreter Mode: Interpreter Not Needed        Follow-Up Questions: Do You Have Any Concerns About Your Health As You Heal From Delivery?: No Do You Have Any Concerns About Your Infants Health?: No  Edinburgh Postnatal Depression Scale:  In the Past 7 Days:    PHQ2-9 Depression Scale:     Discharge Follow-up: Edinburgh score requires follow up?:  (declines screening, took recently with [facility] nurse-no concerns, endorses she is feeling well emotionally.) Patient was advised of the following resources:: Support Group, Breastfeeding Support Group  Post-discharge interventions: Reviewed Newborn Safe Sleep Practices  Salena Saner, RN 01/07/2023 16:58

## 2023-01-23 ENCOUNTER — Ambulatory Visit: Payer: Medicaid Other | Admitting: Obstetrics and Gynecology

## 2023-01-23 ENCOUNTER — Other Ambulatory Visit: Payer: Medicaid Other

## 2023-09-01 ENCOUNTER — Telehealth: Admitting: Physician Assistant

## 2023-09-01 DIAGNOSIS — B9689 Other specified bacterial agents as the cause of diseases classified elsewhere: Secondary | ICD-10-CM | POA: Diagnosis not present

## 2023-09-01 DIAGNOSIS — N760 Acute vaginitis: Secondary | ICD-10-CM

## 2023-09-01 MED ORDER — METRONIDAZOLE 500 MG PO TABS
500.0000 mg | ORAL_TABLET | Freq: Two times a day (BID) | ORAL | 0 refills | Status: AC
Start: 2023-09-01 — End: ?

## 2023-09-01 NOTE — Progress Notes (Signed)
 Virtual Visit Consent   Leslie Skinner, you are scheduled for a virtual visit with a [facility] provider today. Just as with appointments in the office, your consent must be obtained to participate. Your consent will be active for this visit and any virtual visit you may have with one of our providers in the next 365 days. If you have a MyChart account, a copy of this consent can be sent to you electronically.  As this is a virtual visit, video technology does not allow for your provider to perform a traditional examination. This may limit your provider's ability to fully assess your condition. If your provider identifies any concerns that need to be evaluated in person or the need to arrange testing (such as labs, EKG, etc.), we will make arrangements to do so. Although advances in technology are sophisticated, we cannot ensure that it will always work on either your end or our end. If the connection with a video visit is poor, the visit may have to be switched to a telephone visit. With either a video or telephone visit, we are not always able to ensure that we have a secure connection.  By engaging in this virtual visit, you consent to the provision of healthcare and authorize for your insurance to be billed (if applicable) for the services provided during this visit. Depending on your insurance coverage, you may receive a charge related to this service.  I need to obtain your verbal consent now. Are you willing to proceed with your visit today? Leslie Skinner has provided verbal consent on 09/01/2023 for a virtual visit (video or telephone). Leslie Skinner, [place]  Date: 09/01/2023 10:58 AM   Virtual Visit via Video Note   I, Leslie Skinner, connected with  Leslie Skinner  ([id number], 1995/03/14) on 09/01/23 at 11:15 AM EDT by a video-enabled telemedicine application and verified that I am speaking with the correct person using two identifiers.  Location: Patient: Virtual  Visit Location Patient: Home Provider: Virtual Visit Location Provider: Home Office   I discussed the limitations of evaluation and management by telemedicine and the availability of in person appointments. The patient expressed understanding and agreed to proceed.    History of Present Illness: Leslie Skinner is a 28 y.o. who identifies as a female who was assigned female at birth, and is being seen today for one week of low back pain associated with foul smelling vaginal discharge.  No change to soaps, lotions, hygiene products. Denies sexual activity, concern for STI or pregnancy. Some urinary urgency/frequency without dysuria, hematuria, nausea/vomiting.  HPI: HPI  Problems:  Patient Active Problem List   Diagnosis Date Noted   Encounter for induction of labor 12/09/2022   Group B Streptococcus carrier, +RV culture, currently pregnant 12/03/2022   Gestational diabetes mellitus (GDM) affecting pregnancy-Insulin  09/25/2022   BMI 45.0-49.9, adult (HCC) 06/25/2022   Hx of shoulder dystocia in prior pregnancy, currently pregnant 06/25/2022   Chronic hypertension affecting pregnancy 12/29/2021   Maternal morbid obesity, antepartum (HCC) 06/15/2021   Previous pregnancy with congenital heart defect, currently pregnant (TOF) 06/15/2021   Supervision of high risk pregnancy, antepartum 05/31/2021   SVD (spontaneous vaginal delivery) 05/03/2019    Allergies:  Allergies  Allergen Reactions   Morphine And Codeine Shortness Of Breath and Other (See Comments)    Hot flashes Difficulty breathing   Ultram  [Tramadol ] Other (See Comments)    Hot flashes    Pitocin  [Oxytocin ] Itching and Rash   Medications:  Current Outpatient Medications:    metroNIDAZOLE  (FLAGYL ) 500 MG tablet, Take 1 tablet (500 mg total) by mouth 2 (two) times daily., Disp: 14 tablet, Rfl: 0  Observations/Objective: Patient is well-developed, well-nourished in no acute distress.  Resting comfortably at home.  Head is  normocephalic, atraumatic.  No labored breathing.  Speech is clear and coherent with logical content.  Patient is alert and oriented at baseline.   Assessment and Plan: 1. Bacterial vaginosis (Primary) - metroNIDAZOLE  (FLAGYL ) 500 MG tablet; Take 1 tablet (500 mg total) by mouth 2 (two) times daily.  Dispense: 14 tablet; Refill: 0  Classic symptoms for her. Prior history. No alarm signs or symptoms. Start Flagyl  BID x 7 days. Strict in-person follow-up precautions reviewed with patient. .   Follow Up Instructions: I discussed the assessment and treatment plan with the patient. The patient was provided an opportunity to ask questions and all were answered. The patient agreed with the plan and demonstrated an understanding of the instructions.  A copy of instructions were sent to the patient via MyChart unless otherwise noted below.   The patient was advised to call back or seek an in-person evaluation if the symptoms worsen or if the condition fails to improve as anticipated.    Leslie Velma Lunger, PA-C

## 2023-09-01 NOTE — Patient Instructions (Signed)
 Leslie Skinner, thank you for joining Leslie Velma Lunger, PA-C for today's virtual visit.  While this provider is not your primary care provider (PCP), if your PCP is located in our provider database this encounter information will be shared with them immediately following your visit.   A [facility] MyChart account gives you access to today's visit and all your visits, tests, and labs performed at [facility]  click here if you don't have a [facility] MyChart account or go to [email or web address]  Consent: (Patient) Leslie Skinner provided verbal consent for this virtual visit at the beginning of the encounter.  Current Medications:  Current Outpatient Medications:    acetaminophen  (TYLENOL ) 325 MG tablet, Take 2 tablets (650 mg total) by mouth every 4 (four) hours as needed (for pain scale < 4)., Disp: 100 tablet, Rfl: 0   etonogestrel  (NEXPLANON ) 68 MG IMPL implant, 1 each (68 mg total) by Subdermal route once for 1 dose., Disp: 1 each, Rfl: 0   furosemide  (LASIX ) 20 MG tablet, Take 1 tablet (20 mg total) by mouth daily for 7 days., Disp: 7 tablet, Rfl: 0   ibuprofen  (ADVIL ) 600 MG tablet, Take 1 tablet (600 mg total) by mouth every 6 (six) hours., Disp: 30 tablet, Rfl: 0   NIFEdipine  (ADALAT  CC) 30 MG 24 hr tablet, Take 1 tablet (30 mg total) by mouth daily., Disp: 30 tablet, Rfl: 1   Prenatal Vit-Fe Fumarate-FA (PRENATAL PO), Take 1 tablet by mouth daily., Disp: , Rfl:    Medications ordered in this encounter:  No orders of the defined types were placed in this encounter.    *If you need refills on other medications prior to your next appointment, please contact your pharmacy*  Follow-Up: Call back or seek an in-person evaluation if the symptoms worsen or if the condition fails to improve as anticipated.  [facility] [phone]  Other Instructions Vaginal Infection (Bacterial Vaginosis): What to Know  Bacterial vaginosis is an infection  of the vagina. It happens when the balance of normal germs (bacteria) in the vagina changes. If you don't get treated, it can make it easier for you to get other infections from sex. These are called sexually transmitted infections (STIs). If you're pregnant, you need to get treated right away. This infection can cause a baby to be born early or at a low birth weight. What are the causes? This infection happens when too many harmful germs grow in the vagina. You can't get this infection from toilet seats, bedsheets, swimming pools, or things that touch your vagina. What increases the risk? Having sex with a new person or more than one person. Having sex without protection. Douching. Having an intrauterine device (IUD). Smoking. Using drugs or drinking alcohol. These can lead you to do risky things. Taking certain antibiotics. Being pregnant. What are the signs or symptoms? Some females have no symptoms. Symptoms may include: A gray or white discharge from your vagina. It can be watery or foamy. A fishy smell. This can happen after sex or during your menstrual period. Itching in and around your vagina. Burning or pain when you pee. How is this treated? This infection is treated with antibiotics. These may be given to you as: A pill. A cream for your vagina. A medicine that you put into your vagina (suppository). If the infection comes back, you may need more antibiotics. Follow these instructions at home: Medicines Take your medicines as told. Take or use your antibiotics as  told. Do not stop using them even if you start to feel better. General instructions If the person you have sex with is a female, tell her that you have this infection. She will need to follow up with her doctor. Female partners don't need to be treated. Do not have sex until you finish treatment. Drink more fluids as told. Keep your vagina and butt clean. Wash these areas with warm water each day. Wipe from front  to back after you poop. If you're breastfeeding a baby, talk to your doctor if you should keep doing so during treatment. How is this prevented? Self-care Do not douche. Do not use deodorant sprays on your vagina. Wear cotton underwear. Do not wear tight pants and pantyhose, especially in the summer. Safe sex Use condoms the correct way and every time you have sex. Use dental dams to protect yourself during oral sex. Limit how many people you have sex with. Get tested for STIs. The person you have sex with should also get tested. Drugs and alcohol Do not smoke, vape, or use nicotine or tobacco. Do not use drugs. Limit the amount of alcohol you drink because it can lead you to do risky things. Where to find more information To learn more: Go to TonerPromos.no. Click Health Topics A-Z. Type bacterial vaginosis in the search bar. American Sexual Health Association (ASHA): ashasexualhealth.org U.S. Department of Health and [employer], Office on Women's Health: TravelLesson.ca Contact a doctor if: Your symptoms don't get better, even after treatment. You have more discharge or pain when you pee. You have a fever or chills. You have pain in your belly or in the area between your hips. You have pain during sex. You bleed from your vagina between menstrual periods. This information is not intended to replace advice given to you by your health care provider. Make sure you discuss any questions you have with your health care provider. Document Revised: 08/15/2022 Document Reviewed: 08/15/2022 Elsevier Patient Education  2024 Elsevier Inc.   If you have been instructed to have an in-person evaluation today at a local Urgent Care facility, please use the link below. It will take you to a list of all of our available [place] [facility], including address, phone number and hours of operation. Please do not delay care.  [facility]  If you or a family member do not have a  primary care provider, use the link below to schedule a visit and establish care. When you choose a [facility] primary care physician or advanced practice provider, you gain a long-term partner in health. Find a Primary Care Provider  Learn more about [facility]'s in-office and virtual care options: [place] - Get Care Now

## 2023-10-05 ENCOUNTER — Encounter

## 2023-10-06 ENCOUNTER — Telehealth: Admitting: Physician Assistant

## 2023-10-06 DIAGNOSIS — B001 Herpesviral vesicular dermatitis: Secondary | ICD-10-CM

## 2023-10-06 MED ORDER — VALACYCLOVIR HCL 1 G PO TABS: 2000.0000 mg | ORAL_TABLET | Freq: Two times a day (BID) | ORAL | 0 refills | Status: AC

## 2023-10-06 NOTE — Progress Notes (Signed)
 Virtual Visit Consent   Leslie Skinner, you are scheduled for a virtual visit with a [facility] provider today. Just as with appointments in the office, your consent must be obtained to participate. Your consent will be active for this visit and any virtual visit you may have with one of our providers in the next 365 days. If you have a MyChart account, a copy of this consent can be sent to you electronically.  As this is a virtual visit, video technology does not allow for your provider to perform a traditional examination. This may limit your provider's ability to fully assess your condition. If your provider identifies any concerns that need to be evaluated in person or the need to arrange testing (such as labs, EKG, etc.), we will make arrangements to do so. Although advances in technology are sophisticated, we cannot ensure that it will always work on either your end or our end. If the connection with a video visit is poor, the visit may have to be switched to a telephone visit. With either a video or telephone visit, we are not always able to ensure that we have a secure connection.  By engaging in this virtual visit, you consent to the provision of healthcare and authorize for your insurance to be billed (if applicable) for the services provided during this visit. Depending on your insurance coverage, you may receive a charge related to this service.  I need to obtain your verbal consent now. Are you willing to proceed with your visit today? Leslie Skinner has provided verbal consent on 10/06/2023 for a virtual visit (video or telephone). Leslie Skinner, [place]  Date: 10/06/2023 8:33 AM   Virtual Visit via Video Note   I, Leslie Skinner, connected with  Leslie Skinner  ([id number], [birth year]) on 10/06/23 at  8:30 AM EDT by a video-enabled telemedicine application and verified that I am speaking with the correct person using two identifiers.  Location: Patient: Virtual Visit Location  Patient: Home Provider: Virtual Visit Location Provider: Home Office   I discussed the limitations of evaluation and management by telemedicine and the availability of in person appointments. The patient expressed understanding and agreed to proceed.    History of Present Illness: Leslie Skinner is a 28 y.o. who identifies as a female who was assigned female at birth, and is being seen today for cold sore.  HPI: Mouth Lesions  The current episode started today. The onset was gradual. The problem has been unchanged. Nothing relieves the symptoms. Nothing aggravates the symptoms. Associated symptoms include mouth sores.    Problems:  Patient Active Problem List   Diagnosis Date Noted   Encounter for induction of labor 12/09/2022   Group B Streptococcus carrier, +RV culture, currently pregnant 12/03/2022   Gestational diabetes mellitus (GDM) affecting pregnancy-Insulin  09/25/2022   BMI 45.0-49.9, adult (HCC) 06/25/2022   Hx of shoulder dystocia in prior pregnancy, currently pregnant 06/25/2022   Chronic hypertension affecting pregnancy 12/29/2021   Maternal morbid obesity, antepartum (HCC) 06/15/2021   Previous pregnancy with congenital heart defect, currently pregnant (TOF) 06/15/2021   Supervision of high risk pregnancy, antepartum 05/31/2021   SVD (spontaneous vaginal delivery) 05/03/2019    Allergies:  Allergies  Allergen Reactions   Morphine And Codeine Shortness Of Breath and Other (See Comments)    Hot flashes Difficulty breathing   Ultram  [Tramadol ] Other (See Comments)    Hot flashes    Pitocin  [Oxytocin ] Itching and Rash   Medications:  Current Outpatient Medications:  metroNIDAZOLE  (FLAGYL ) 500 MG tablet, Take 1 tablet (500 mg total) by mouth 2 (two) times daily., Disp: 14 tablet, Rfl: 0  Observations/Objective: Patient is well-developed, well-nourished in no acute distress.  Resting comfortably  at home.  Head is normocephalic, atraumatic.  No labored  breathing.  Speech is clear and coherent with logical content.  Patient is alert and oriented at baseline. Vesicular lesion left upper lip    Assessment and Plan: 1. Cold sore (Primary)  Patient presenting with cold sore. Patient afebrile.  No evidence of infections including cellulitis, herpes zoster, atopic derm, contact dermatitis, measles, rubella. Patient prescribed steroid taper, antihistamine, and nightly hydroxyzine.  Advised to monitor exposures and record.  Supportive therapies discussed.   Follow up with primary provider in 2-3 days if symptoms continue. Report to ED if high fever, spread of rash, pain, or other concerns.  Follow Up Instructions: I discussed the assessment and treatment plan with the patient. The patient was provided an opportunity to ask questions and all were answered. The patient agreed with the plan and demonstrated an understanding of the instructions.  A copy of instructions were sent to the patient via MyChart unless otherwise noted below.    The patient was advised to call back or seek an in-person evaluation if the symptoms worsen or if the condition fails to improve as anticipated.    Leslie Shuck, PA-C

## 2023-10-06 NOTE — Patient Instructions (Signed)
  Leslie Skinner, thank you for joining Leslie Shuck, PA-C for today's virtual visit.  While this provider is not your primary care provider (PCP), if your PCP is located in our provider database this encounter information will be shared with them immediately following your visit.   A [facility] MyChart account gives you access to today's visit and all your visits, tests, and labs performed at [facility]  click here if you don't have a [facility] MyChart account or go to [email or web address]  Consent: (Patient) Leslie Skinner provided verbal consent for this virtual visit at the beginning of the encounter.  Current Medications:  Current Outpatient Medications:    metroNIDAZOLE  (FLAGYL ) 500 MG tablet, Take 1 tablet (500 mg total) by mouth 2 (two) times daily., Disp: 14 tablet, Rfl: 0   Medications ordered in this encounter:  No orders of the defined types were placed in this encounter.    *If you need refills on other medications prior to your next appointment, please contact your pharmacy*  Follow-Up: Call back or seek an in-person evaluation if the symptoms worsen or if the condition fails to improve as anticipated.  [facility] [phone]  Other Instructions Please report to the nearest Emergency room with any worsening symptoms. Follow up with primary care provider (PCP) in 2 -3 days.    If you have been instructed to have an in-person evaluation today at a local Urgent Care facility, please use the link below. It will take you to a list of all of our available [facility], including address, phone number and hours of operation. Please do not delay care.  [facility]  If you or a family member do not have a primary care provider, use the link below to schedule a visit and establish care. When you choose a [facility] primary care physician or advanced practice provider, you gain a long-term partner in  health. Find a Primary Care Provider  Learn more about [facility]'s in-office and virtual care options: [facility] - Get Care Now

## 2023-10-08 IMAGING — US US OB TRANSVAGINAL
1 series · 15 of 27 positions shown · non-contrast
Comparison: Ultrasound dated March 10, 2021

CLINICAL DATA: Vaginal bleeding

EXAM:
OBSTETRIC <14 WK US AND TRANSVAGINAL OB US
TECHNIQUE: Both transabdominal and transvaginal ultrasound examinations were
performed for complete evaluation of the gestation as well as the
maternal uterus, adnexal regions, and pelvic cul-de-sac.
Transvaginal technique was performed to assess early pregnancy.

[Series 1: us ob transvaginal · 15 of 27 slices shown]
[im 1/27]
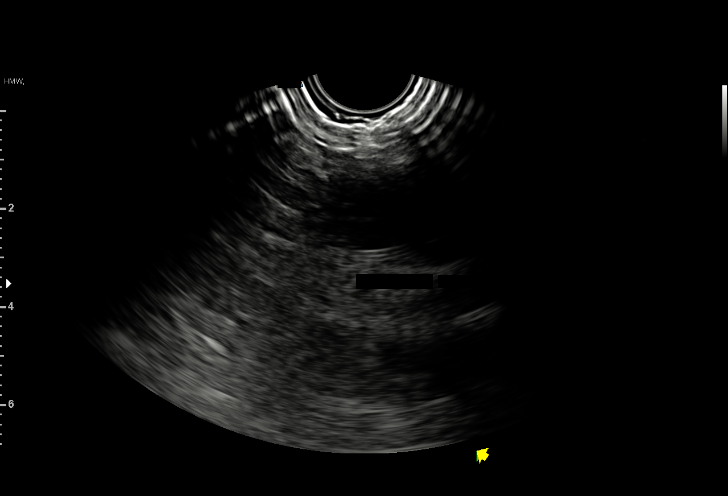
[im 3/27]
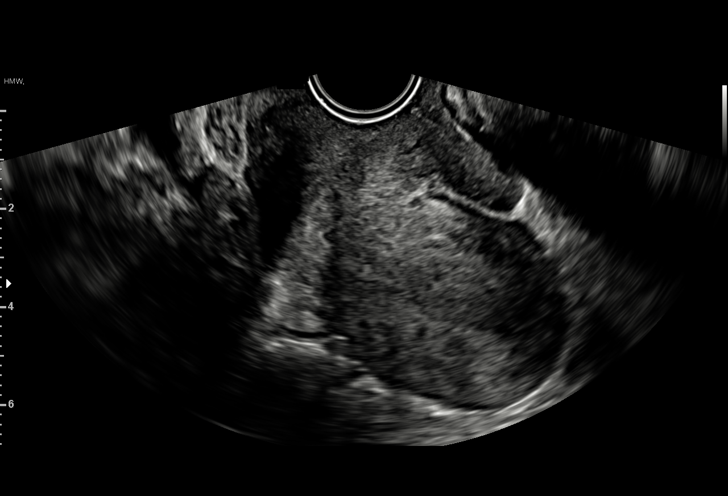
[im 5/27]
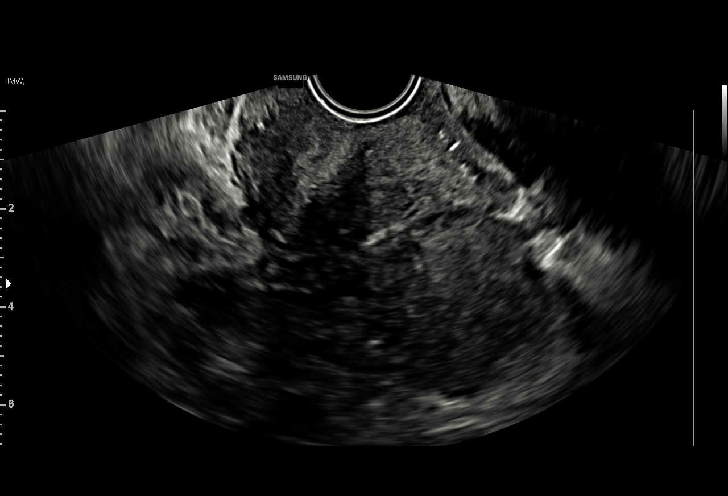
[im 7/27]
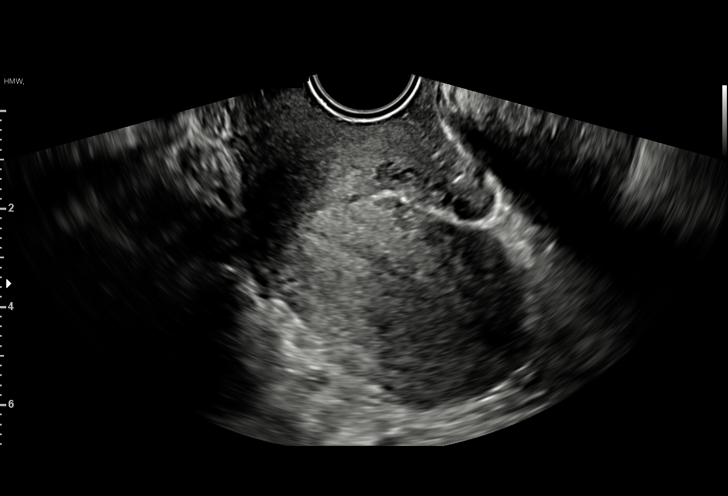
[im 9/27]
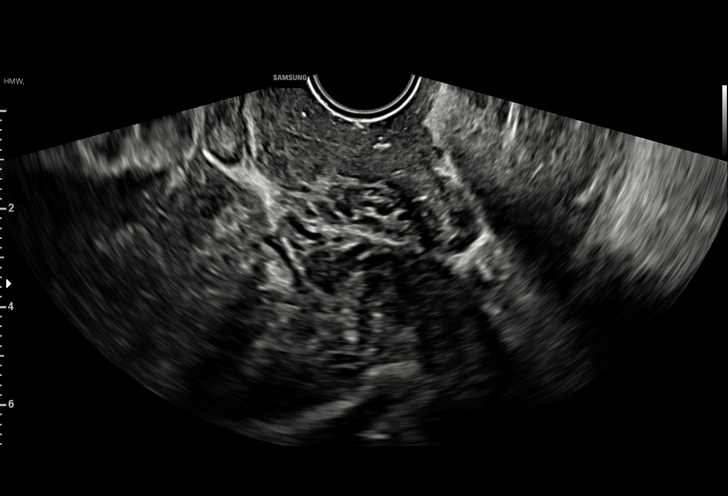
[im 10/27]
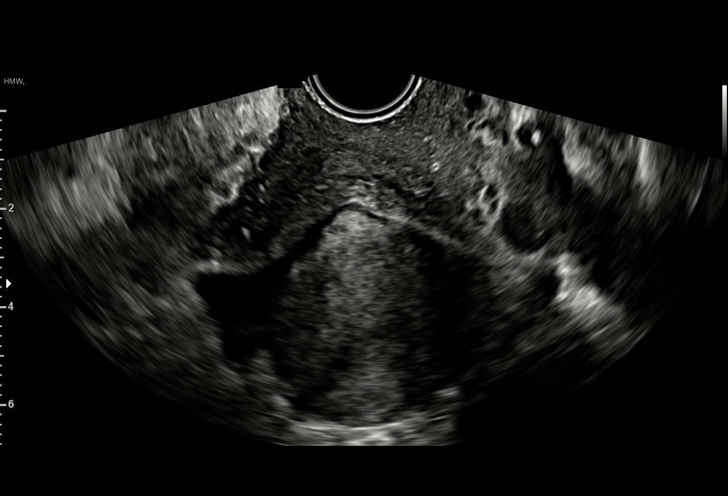
[im 12/27]
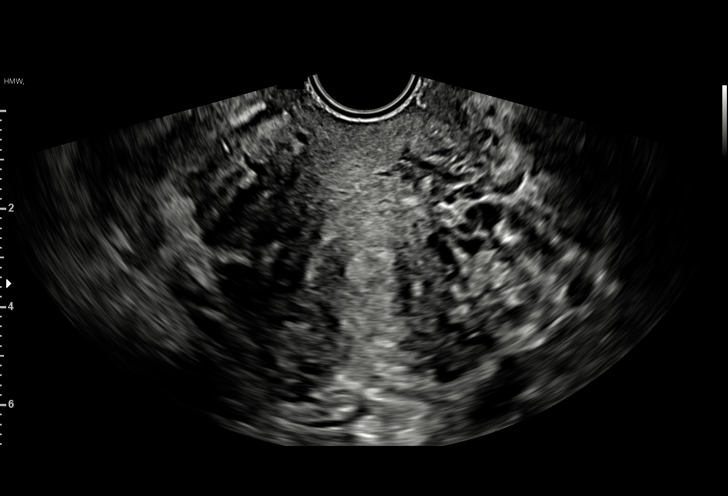
[im 14/27]
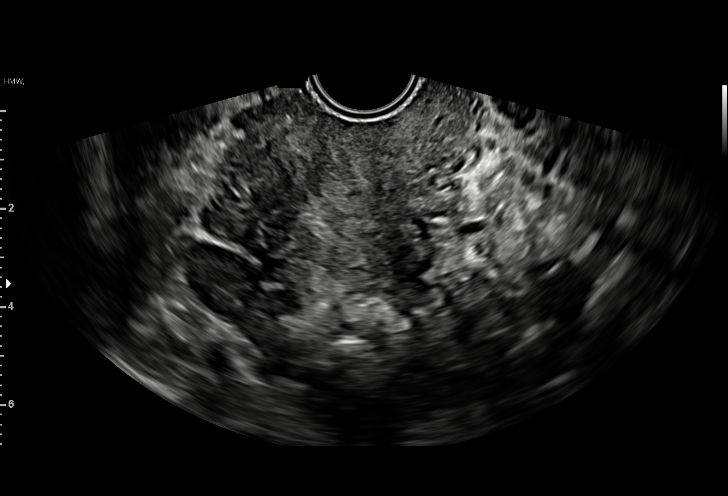
[im 16/27]
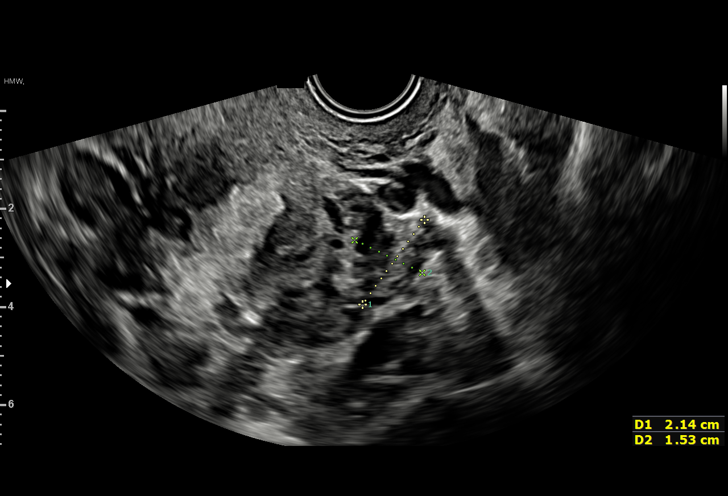
[im 18/27]
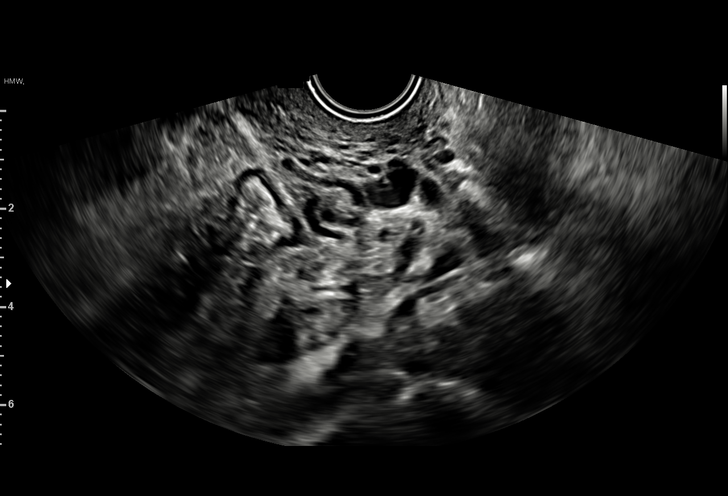
[im 19/27]
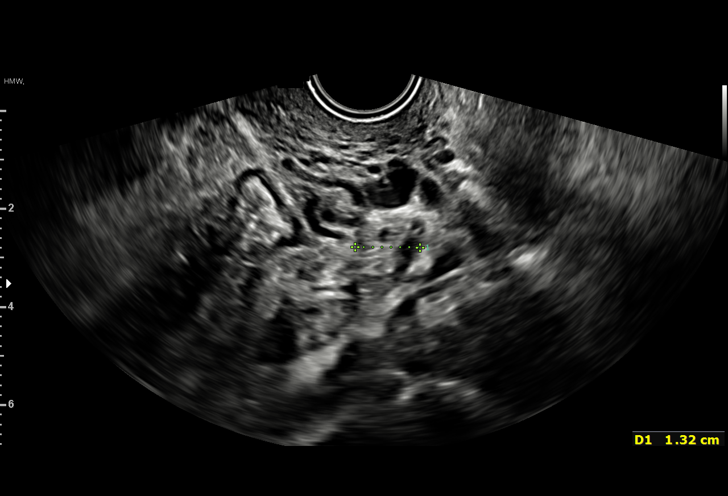
[im 21/27]
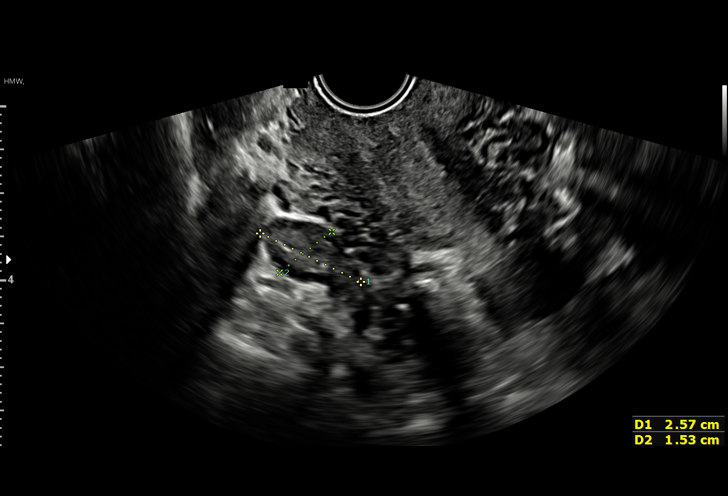
[im 23/27]
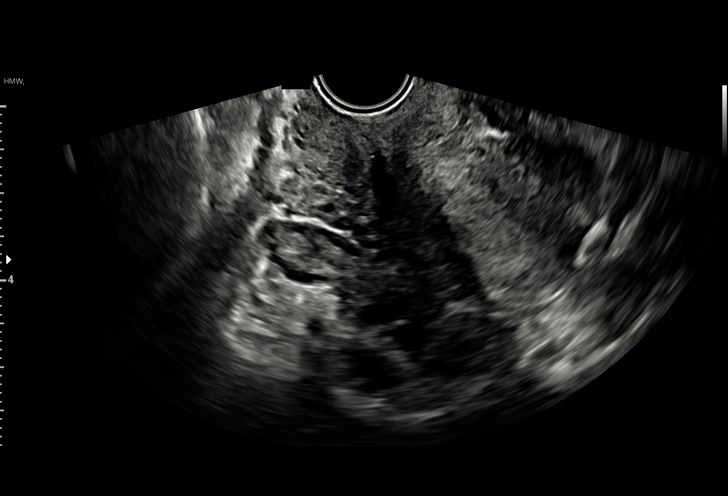
[im 25/27]
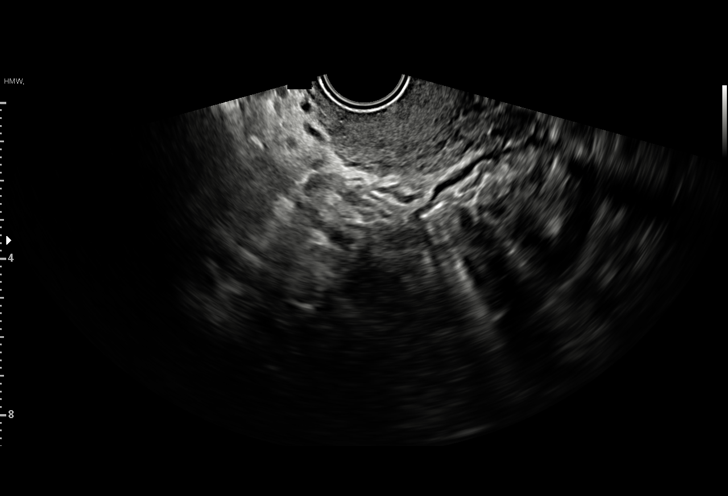
[im 27/27]
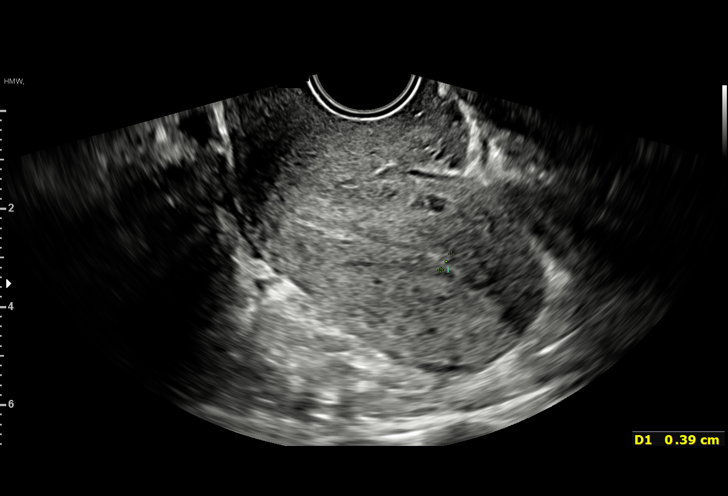

[15 of 27 positions shown; findings below may reference images not displayed]

FINDINGS: Intrauterine gestational sac: None

Yolk sac:  Not Visualized.

Embryo:  Not Visualized.

Cardiac Activity: Not Visualized.

Heart Rate: NA

Subchorionic hemorrhage:  None visualized.

Maternal uterus/adnexae: Normal appearance of the bilateral ovaries.
IMPRESSION: No intrauterine gestational sac, yolk sac, or fetal pole identified.
In the setting of positive pregnancy test and no definite
intrauterine pregnancy, this reflects a pregnancy of unknown
location. Differential considerations include early normal IUP,
abnormal IUP, or nonvisualized ectopic pregnancy. Differentiation is
achieved with serial beta HCG supplemented by repeat sonography as
clinically warranted.

## 2023-10-15 ENCOUNTER — Telehealth: Admitting: Physician Assistant

## 2023-10-15 DIAGNOSIS — B3731 Acute candidiasis of vulva and vagina: Secondary | ICD-10-CM

## 2023-10-15 MED ORDER — FLUCONAZOLE 150 MG PO TABS
150.0000 mg | ORAL_TABLET | ORAL | 0 refills | Status: AC | PRN
Start: 2023-10-15 — End: ?

## 2023-10-15 NOTE — Progress Notes (Signed)
 Virtual Visit Consent   Leslie Skinner, you are scheduled for a virtual visit with a [facility] provider today. Just as with appointments in the office, your consent must be obtained to participate. Your consent will be active for this visit and any virtual visit you may have with one of our providers in the next 365 days. If you have a MyChart account, a copy of this consent can be sent to you electronically.  As this is a virtual visit, video technology does not allow for your provider to perform a traditional examination. This may limit your provider's ability to fully assess your condition. If your provider identifies any concerns that need to be evaluated in person or the need to arrange testing (such as labs, EKG, etc.), we will make arrangements to do so. Although advances in technology are sophisticated, we cannot ensure that it will always work on either your end or our end. If the connection with a video visit is poor, the visit may have to be switched to a telephone visit. With either a video or telephone visit, we are not always able to ensure that we have a secure connection.  By engaging in this virtual visit, you consent to the provision of healthcare and authorize for your insurance to be billed (if applicable) for the services provided during this visit. Depending on your insurance coverage, you may receive a charge related to this service.  I need to obtain your verbal consent now. Are you willing to proceed with your visit today? Leslie Skinner has provided verbal consent on 10/15/2023 for a virtual visit (video or telephone). Delon CHRISTELLA Dickinson, PA-C  Date: 10/15/2023 8:10 AM   Virtual Visit via Video Note   I, Delon CHRISTELLA Dickinson, connected with  Leslie Skinner  ([id number], Jul 07, 1995) on 10/15/23 at  8:00 AM EDT by a video-enabled telemedicine application and verified that I am speaking with the correct person using two identifiers.  Location: Patient: Virtual Visit  Location Patient: Home Provider: Virtual Visit Location Provider: Home Office   I discussed the limitations of evaluation and management by telemedicine and the availability of in person appointments. The patient expressed understanding and agreed to proceed.    History of Present Illness: Leslie Skinner is a 28 y.o. who identifies as a female who was assigned female at birth, and is being seen today for vaginal irritation.  HPI: Vaginal Itching [name] primary symptoms include genital itching and vaginal discharge. [name] pertinent negatives include no genital odor. This is a new problem. The current episode started yesterday. The problem occurs constantly. The problem has been unchanged. The patient is experiencing no pain. Pertinent negatives include no chills, dysuria, fever, flank pain, nausea or painful intercourse. The vaginal discharge was white and thick. There has been no bleeding. She has not been passing clots. She has not been passing tissue. The symptoms are aggravated by tactile pressure. She has tried nothing for the symptoms. The treatment provided no relief. She is not sexually active.     Problems:  Patient Active Problem List   Diagnosis Date Noted   Encounter for induction of labor 12/09/2022   Group B Streptococcus carrier, +RV culture, currently pregnant 12/03/2022   Gestational diabetes mellitus (GDM) affecting pregnancy-Insulin  09/25/2022   BMI 45.0-49.9, adult (HCC) 06/25/2022   Hx of shoulder dystocia in prior pregnancy, currently pregnant 06/25/2022   Chronic hypertension affecting pregnancy 12/29/2021   Maternal morbid obesity, antepartum (HCC) 06/15/2021   Previous pregnancy  with congenital heart defect, currently pregnant (TOF) 06/15/2021   Supervision of high risk pregnancy, antepartum 05/31/2021   SVD (spontaneous vaginal delivery) 05/03/2019    Allergies:  Allergies  Allergen Reactions   Morphine And Codeine Shortness Of Breath and Other  (See Comments)    Hot flashes Difficulty breathing   Ultram  [Tramadol ] Other (See Comments)    Hot flashes    Pitocin  [Oxytocin ] Itching and Rash   Medications:  Current Outpatient Medications:    fluconazole  (DIFLUCAN ) 150 MG tablet, Take 1 tablet (150 mg total) by mouth every 3 (three) days as needed., Disp: 2 tablet, Rfl: 0   metroNIDAZOLE  (FLAGYL ) 500 MG tablet, Take 1 tablet (500 mg total) by mouth 2 (two) times daily., Disp: 14 tablet, Rfl: 0  Observations/Objective: Patient is well-developed, well-nourished in no acute distress.  Resting comfortably at home.  Head is normocephalic, atraumatic.  No labored breathing.  Speech is clear and coherent with logical content.  Patient is alert and oriented at baseline.    Assessment and Plan: 1. Yeast vaginitis (Primary) - fluconazole  (DIFLUCAN ) 150 MG tablet; Take 1 tablet (150 mg total) by mouth every 3 (three) days as needed.  Dispense: 2 tablet; Refill: 0  - Symptoms consistent with yeast vaginitis - Fluconazole  prescribed - Limit bubble baths, scented lotions/soaps/detergents - Limit tight fitting clothing - Seek on person evaluation if not improving or if symptoms worsen   Follow Up Instructions: I discussed the assessment and treatment plan with the patient. The patient was provided an opportunity to ask questions and all were answered. The patient agreed with the plan and demonstrated an understanding of the instructions.  A copy of instructions were sent to the patient via MyChart unless otherwise noted below.    The patient was advised to call back or seek an in-person evaluation if the symptoms worsen or if the condition fails to improve as anticipated.    Delon CHRISTELLA Dickinson, PA-C

## 2023-10-15 NOTE — Patient Instructions (Signed)
 Leslie Skinner, thank you for joining Leslie CHRISTELLA Dickinson, PA-C for today's virtual visit.  While this provider is not your primary care provider (PCP), if your PCP is located in our provider database this encounter information will be shared with them immediately following your visit.   A [facility] MyChart account gives you access to today's visit and all your visits, tests, and labs performed at [facility]  click here if you don't have a [facility] MyChart account or go to [email or web address]  Consent: (Patient) Leslie Skinner provided verbal consent for this virtual visit at the beginning of the encounter.  Current Medications:  Current Outpatient Medications:    fluconazole  (DIFLUCAN ) 150 MG tablet, Take 1 tablet (150 mg total) by mouth every 3 (three) days as needed., Disp: 2 tablet, Rfl: 0   metroNIDAZOLE  (FLAGYL ) 500 MG tablet, Take 1 tablet (500 mg total) by mouth 2 (two) times daily., Disp: 14 tablet, Rfl: 0   Medications ordered in this encounter:  Meds ordered this encounter  Medications   fluconazole  (DIFLUCAN ) 150 MG tablet    Sig: Take 1 tablet (150 mg total) by mouth every 3 (three) days as needed.    Dispense:  2 tablet    Refill:  0    Supervising Provider:   LAMPTEY, PHILIP O [[id number]]     *If you need refills on other medications prior to your next appointment, please contact your pharmacy*  Follow-Up: Call back or seek an in-person evaluation if the symptoms worsen or if the condition fails to improve as anticipated.  [facility] [phone]  Other Instructions Vaginal Probiotics: AZO vaginal probiotic OLLY Happy Hoo-Ha RAW Vaginal Care RenewLife Women's vaginal probiotic RepHresh Pro-B  Vaginal washes: Honey Pot Summer's Eve Vagisil Feminine cleanser  Boric Acid Suppositories  Healthy vaginal hygiene practices    -  Avoid sleeper pajamas. Nightgowns allow air to circulate.  Sleep without underpants  whenever possible.   -  Wear cotton underpants during the day. Double-rinse underwear after washing to avoid residual irritants. Do not use fabric softeners for underwear and swimsuits.   - Avoid tights, leotards, leggings, skinny jeans, and other tight-fitting clothing. Skirts and loose-fitting pants allow air to circulate.   - Avoid pantyliners.  Instead use tampons or cotton pads.   - Use the restroom after intercourse to help prevent UTI's   - Daily warm bathing is helpful:     - Soak in clean water (no soap) for 10 to 15 minutes. Adding vinegar or baking soda to the water has not been specifically studied and may not be better than clean water alone.      - Use soap to wash regions other than the genital area just before getting out of the tub. Limit use of any soap on genital areas. Use fragance-free soaps.     - Rinse the genital area well and gently pat dry.  Don't rub.  Hair dryer to assist with drying can be used only if on cool setting.     - Do not use bubble baths or perfumed soaps.   - Do not use any feminine sprays, douches or powders.  These contain chemicals that will irritate the skin.   - If the genital area is tender or swollen, cool compresses may relieve the discomfort. Unscented wet wipes can be used instead of toilet paper for wiping.    - Emollients, such as Vaseline, may help protect skin and can be applied to the  irritated area.   - Always remember to wipe front-to-back after bowel movements. Pat dry after urination.   - Do not sit in wet swimsuits for long periods of time after swimming    If you have been instructed to have an in-person evaluation today at a local Urgent Care facility, please use the link below. It will take you to a list of all of our available [facility], including address, phone number and hours of operation. Please do not delay care.  [facility]  If you or a family member do not have a primary care  provider, use the link below to schedule a visit and establish care. When you choose a [facility] primary care physician or advanced practice provider, you gain a long-term partner in health. Find a Primary Care Provider  Learn more about [facility]'s in-office and virtual care options:  - Get Care Now

## 2023-12-23 ENCOUNTER — Ambulatory Visit: Admitting: Family Medicine

## 2023-12-23 ENCOUNTER — Encounter: Payer: Self-pay | Admitting: Family Medicine

## 2023-12-23 VITALS — BP 128/82 | HR 96 | Ht 63.0 in | Wt 249.0 lb

## 2023-12-23 DIAGNOSIS — F419 Anxiety disorder, unspecified: Secondary | ICD-10-CM

## 2023-12-23 DIAGNOSIS — N898 Other specified noninflammatory disorders of vagina: Secondary | ICD-10-CM

## 2023-12-23 DIAGNOSIS — G8929 Other chronic pain: Secondary | ICD-10-CM | POA: Diagnosis not present

## 2023-12-23 DIAGNOSIS — M545 Low back pain, unspecified: Secondary | ICD-10-CM

## 2023-12-23 MED ORDER — HYDROXYZINE HCL 10 MG PO TABS
10.0000 mg | ORAL_TABLET | Freq: Three times a day (TID) | ORAL | 0 refills | Status: AC | PRN
Start: 2023-12-23 — End: ?

## 2023-12-23 NOTE — Progress Notes (Signed)
 SUBJECTIVE:   CHIEF COMPLAINT / HPI:   Discussed the use of AI scribe software for clinical note transcription with the patient, who gave verbal consent to proceed.  History of Present Illness Leslie Skinner is a 28 year old female with scoliosis who presents with back pain and anxiety.  Spinal pain - Chronic back pain involving the entire spine - Pain worsened during pregnancy - Unrelieved by acetaminophen  or ibuprofen  - Naproxen  provided relief in the past - Physical therapy was beneficial but discontinued - Pain disrupts sleep, requiring frequent position changes - No leg pain, numbness, or bowel/bladder dysfunction  Anxiety and depressive symptoms - Symptoms of anxiety and depression predating pregnancies - Panic attacks characterized by racing heart and sense of impending doom - Low motivation and energy - Impaired ability to engage with her children - Interest in PRN medication for anxiety  Vaginal and vulvar symptoms - 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 - Recent burning sensation and sore, now improved - Increased vaginal discharge without fishy odor - Frequent yeast infections - No burning with urination, fever, or abnormal urine discoloration or hematuria  Weight management concerns - Interest in weight loss options - History of gestational diabetes - would like to see healthy weight/wellness specialist     PERTINENT  PMH / PSH: gHTN and gDM  OBJECTIVE:   BP 128/82   Pulse 96   Ht 5' 3 (1.6 m)   Wt 249 lb (112.9 kg)   SpO2 96%   BMI 44.11 kg/m    Physical Exam General: NAD, pleasant, able to participate in exam Respiratory: No respiratory distress Skin: warm and dry, no rashes noted Psych: anxious affect and mood  MSK: Mildly tender to palpation lower lumbar spine and paraspinal muscles     12/23/2023    3:52 PM 11/29/2022   10:46 AM 06/25/2022   11:32 AM 08/14/2021   12:00 PM 05/31/2021   11:40 AM  Depression screen PHQ 2/9   Decreased Interest 2 1 0 1 0  Down, Depressed, Hopeless 2 2 0 1 0  PHQ - 2 Score 4 3 0 2 0  Altered sleeping 1 0 0 1 0  Tired, decreased energy 1 1 0 1 0  Change in appetite 1 0 0 1 0  Feeling bad or failure about yourself  1 0 0 0 0  Trouble concentrating 1 1 0 0 0  Moving slowly or fidgety/restless 0 0 0 0 0  Suicidal thoughts 0 0 0 0 0  PHQ-9 Score 9 5 0 5 0  Difficult doing work/chores  Not difficult at all         12/23/2023    3:53 PM 11/29/2022   10:47 AM 06/25/2022   11:32 AM 08/14/2021   12:00 PM  GAD 7 : Generalized Anxiety Score  Nervous, Anxious, on Edge 3 3 0 1  Control/stop worrying 3 3 0 1  Worry too much - different things 3 3 0 1  Trouble relaxing 3 3 0 1  Restless 3 3 0 0  Easily annoyed or irritable 3 3 0 2  Afraid - awful might happen 3 0 0 0  Total GAD 7 Score 21 18 0 6  Anxiety Difficulty  Not difficult at all       ASSESSMENT/PLAN:    Assessment & Plan Chronic low back pain, unspecified back pain laterality, unspecified whether sciatica present No red flags ?related to scoliosis, improved with naproxen . No radicular symptoms or neurological deficits. -  Refer to physical therapy for back pain management. - Advise naproxen  (Aleve ), two tablets twice daily for 3-4 days, then as needed, with food. - Provide lidocaine  patches for localized pain relief. Morbid obesity (HCC) BMI >40 with interest in weight loss interventions. Limited Medicaid coverage for weight loss medications. - Refer to Healthy [facility] Wellness program for structured weight management plan and potential medication options. Anxiety Anxiety with panic attacks and depressive symptoms, exacerbated by postpartum stress and personal loss. Prefers as-needed medication. No suicidal ideation. - Prescribe hydroxyzine 10-20 mg, up to three times daily as needed, starting with nighttime dosing. - Provide information on local therapists accepting Medicaid and recommend therapy. - Discuss  potential transition to daily SSRI vs bupropion if needed - f/u in 4-6 weeks Vaginal itching Recurrent vulvovaginal symptoms Recurrent vulvovaginal symptoms with discharge, possible yeast infection. Recently treated for yeast infection - Advise to return for examination and swab   Payton Coward, MD [facility]

## 2023-12-23 NOTE — Patient Instructions (Addendum)
 VISIT SUMMARY: During your visit, we discussed your chronic back pain, anxiety with panic attacks, recurrent vulvovaginal symptoms, and weight management concerns. We have developed a plan to address each of these issues.  YOUR PLAN: CHRONIC BACK PAIN DUE TO SCOLIOSIS: You have chronic back pain from scoliosis that is not relieved by ibuprofen  but improved with naproxen . -You are referred to physical therapy for back pain management. -Take naproxen  (Aleve ), two tablets twice daily for 3-4 days, then as needed, with food. -Use lidocaine  patches for localized pain relief.  ANXIETY WITH PANIC ATTACKS AND DEPRESSIVE SYMPTOMS: You have anxiety with panic attacks and depressive symptoms, which have been worsened by postpartum stress and personal loss. -Take hydroxyzine 10 mg, up to three times daily as needed, starting with nighttime dosing. -We provided information on local therapists who accept Medicaid and recommend therapy. -If you find yourself using the as-needed medication frequently, we may discuss transitioning to a daily SSRI.  RECURRENT VULVOVAGINAL SYMPTOMS: You have recurrent vulvovaginal symptoms with discharge, possibly a yeast infection. -Hold off on taking the second antifungal pill and return for an examination and swab.  OBESITY: You are interested in weight loss interventions and have a history of gestational diabetes. -You are referred to the [facility] Wellness program for a structured weight management plan and potential medication options.   Psychiatry Resource List (Adults and Children) Most of these providers will take Medicaid. please consult your insurance for a complete and updated list of available providers. When calling to make an appointment have your insurance information available to confirm you are covered.   [facility]:Psychiatry and Counseling [address] [place], [place] [address] [phone]  [facility]  [address] [place], [place] [phone] Crisis [phone]   [facility]:   [facility]: [address]     [phone]   [place]: [address] [place]. [place],        [phone] [place]: [address] [phone],    [phone] [place]: [phone] [address],                   [phone] Children: [facility] [address]         [phone]  [facility] (virtual only) [phone]   [facility]  (Psychiatry only; Adults /children 12 and over, will take Medicaid)  [address] [place] [address], [place], [place] [address]       [phone]   [facility] (Psychiatry & counseling ; adults & children ; will take Medicaid [address]  [place] [address]  Go on-line to complete referral ( [email or web address] [phone]    (Spanish speaking therapists)  [facility]  Psychiatry & counseling; Adults and children;  Call Registration prior to scheduling an appointment [phone] [address]    [place], [place] [address]    [phone]  [facility] (Psychiatry & counseling; adults & children; Medicare no Medicaid)  [address]   [place], [place]  [address]      [phone]    [facility] (up to age 39)  Psychiatry & counseling ,will take Medicaid, must do counseling to receive psychiatry services  [address] [place] [address]        [phone]  [facility] (Psychiatry & counseling; adults & children; will take Medicaid) Will need  a referral from provider [address],  [place], [place]  [phone]   [facility] --- Walk-In Mon-Friday 8am-3pm ( will take Medicaid, Psychiatry, Adults & children,  [address], [place], [place]   [phone]   [facility] [place], Walk-in M-F 8am-12pm and 1pm  -3pm   (Counseling, Psychiatry, will take Medicaid, adults & children)  [address], [place], [place]  [phone]      COUNSELING AGENCIES in [place] (Accepting Medicaid)   Mental Health  (* = Spanish available;  + = Psychiatric services) * [facility]                                [phone] [address] [place] [address], [place], [place] [address] Virtual & Onsite services (Client preference), Accepting New clients  *+ [facility]:                                        [phone] or [phone] Virtual & Onsite, Accepting clients  +[facility]                                [phone]   [facility]:                                                 [phone]   + [facility]:                                           [phone] Onsite & Virtual, Accepting new clients  [facility]                               [phone] Onsite, Accepting new clients  * [facility]:                                                     [phone] Virtual, NOT accepting new clients  [facility]           [phone] Virtual, accepting new clients  [facility]:                                                            [phone] Onsite & Virtual, Accepting new clients  [facility]:                                        [phone] Onsite & Virtual, Waitlist 6-8 months for services  [facility]:                             [phone]   *[facility]                                                [phone] Onsite & Virtual, Accepting new clients  + [facility]:             [phone] or [phone]   [facility]                                                    [phone] Onsite & Virtual, Accepting new clients  *+ [facility] (walk-ins)                                                 [phone] / [address] [place]   [facility]                                              [phone] Onsite & Virtual, Accepting new clients  [facility] (PCIT)                                              [phone] Onsite & Virtual, At Capacity (check in occasionally , subject to change)

## 2024-02-07 ENCOUNTER — Telehealth: Admitting: Family Medicine

## 2024-02-07 DIAGNOSIS — B001 Herpesviral vesicular dermatitis: Secondary | ICD-10-CM

## 2024-02-07 MED ORDER — VALACYCLOVIR HCL 1 G PO TABS: 2000.0000 mg | ORAL_TABLET | Freq: Two times a day (BID) | ORAL | 2 refills | Status: AC

## 2024-02-07 NOTE — Progress Notes (Signed)
 Virtual Visit Consent   Leslie Skinner, you are scheduled for a virtual visit with a [facility] provider today. Just as with appointments in the office, your consent must be obtained to participate. Your consent will be active for this visit and any virtual visit you may have with one of our providers in the next 365 days. If you have a MyChart account, a copy of this consent can be sent to you electronically.  As this is a virtual visit, video technology does not allow for your provider to perform a traditional examination. This may limit your provider's ability to fully assess your condition. If your provider identifies any concerns that need to be evaluated in person or the need to arrange testing (such as labs, EKG, etc.), we will make arrangements to do so. Although advances in technology are sophisticated, we cannot ensure that it will always work on either your end or our end. If the connection with a video visit is poor, the visit may have to be switched to a telephone visit. With either a video or telephone visit, we are not always able to ensure that we have a secure connection.  By engaging in this virtual visit, you consent to the provision of healthcare and authorize for your insurance to be billed (if applicable) for the services provided during this visit. Depending on your insurance coverage, you may receive a charge related to this service.  I need to obtain your verbal consent now. Are you willing to proceed with your visit today? JEANY SEVILLE has provided verbal consent on 02/07/2024 for a virtual visit (video or telephone). Loa Lamp, FNP  Date: 02/07/2024 9:43 AM   Virtual Visit via Video Note   I, Loa Lamp, connected with  Leslie Skinner  ([id number], [birth year]) on 02/07/24 at  9:45 AM EST by a video-enabled telemedicine application and verified that I am speaking with the correct person using two identifiers.  Location: Patient: Virtual Visit Location  Patient: Home Provider: Virtual Visit Location Provider: Home Office   I discussed the limitations of evaluation and management by telemedicine and the availability of in person appointments. The patient expressed understanding and agreed to proceed.    History of Present Illness: Leslie Skinner is a 28 y.o. who identifies as a female who was assigned female at birth, and is being seen today for a fever blister on the left upper lip. Started this am. .  HPI: HPI  Problems:  Patient Active Problem List   Diagnosis Date Noted   Encounter for induction of labor 12/09/2022   Group B Streptococcus carrier, +RV culture, currently pregnant 12/03/2022   Gestational diabetes mellitus (GDM) affecting pregnancy-Insulin  09/25/2022   BMI 45.0-49.9, adult (HCC) 06/25/2022   Hx of shoulder dystocia in prior pregnancy, currently pregnant 06/25/2022   Chronic hypertension affecting pregnancy 12/29/2021   Maternal morbid obesity, antepartum (HCC) 06/15/2021   Previous pregnancy with congenital heart defect, currently pregnant (TOF) 06/15/2021   Supervision of high risk pregnancy, antepartum 05/31/2021   SVD (spontaneous vaginal delivery) 05/03/2019    Allergies:  Allergies  Allergen Reactions   Morphine And Codeine Shortness Of Breath and Other (See Comments)    Hot flashes Difficulty breathing   Ultram  [Tramadol ] Other (See Comments)    Hot flashes    Pitocin  [Oxytocin ] Itching and Rash   Medications:  Current Outpatient Medications:    fluconazole  (DIFLUCAN ) 150 MG tablet, Take 1 tablet (150 mg total) by mouth every 3 (three) days as  needed., Disp: 2 tablet, Rfl: 0   hydrOXYzine  (ATARAX ) 10 MG tablet, Take 1-2 tablets (10-20 mg total) by mouth 3 (three) times daily as needed for anxiety., Disp: 30 tablet, Rfl: 0   metroNIDAZOLE  (FLAGYL ) 500 MG tablet, Take 1 tablet (500 mg total) by mouth 2 (two) times daily., Disp: 14 tablet, Rfl: 0  Observations/Objective: Patient is well-developed,  well-nourished in no acute distress.  Resting comfortably  at home.  Head is normocephalic, atraumatic.  No labored breathing.  Speech is clear and coherent with logical content.  Patient is alert and oriented at baseline.    Assessment and Plan: 1. Herpes labialis (Primary)  Keep moist and apply vaseline   Follow Up Instructions: I discussed the assessment and treatment plan with the patient. The patient was provided an opportunity to ask questions and all were answered. The patient agreed with the plan and demonstrated an understanding of the instructions.  A copy of instructions were sent to the patient via MyChart unless otherwise noted below.     The patient was advised to call back or seek an in-person evaluation if the symptoms worsen or if the condition fails to improve as anticipated.    Maddox Bratcher, FNP

## 2024-02-07 NOTE — Patient Instructions (Signed)
Cold Sore  A cold sore, also called a fever blister, is a small, fluid-filled sore that forms inside the mouth or on the lips, gums, nose, chin, or cheeks. Cold sores can spread to other parts of the body, such as the eyes, fingers, or genitals. Cold sores can spread from person to person (are contagious) until the sores crust over completely. Most cold sores go away within 2 weeks. What are the causes? Cold sores are caused by an infection from a common type of herpes simplex virus (HSV-1). HSV-1 is closely related to the HSV-2virus, which is the virus that causes genital herpes, but these viruses are not the same. Once a person is infected with HSV-1, the virus remains permanently in the body. HSV-1 is spread from person to person through close contact, such as through kissing, touching the affected area, or sharing personal items such as lip balm, razors, a drinking glass, or eating utensils. What increases the risk? You are more likely to develop this condition if you: Are tired, stressed, or sick. Are menstruating. Are pregnant. Take certain medicines. Are exposed to cold weather or too much sun. What are the signs or symptoms? Symptoms of a cold sore outbreak go through different stages. These are the stages of a cold sore: Tingling, itching, or burning is felt 1-2 days before the outbreak. Fluid-filled blisters appear on the lips, inside the mouth, on the nose, or on the cheeks. The blisters start to ooze clear fluid. The blisters dry up, and a yellow crust appears in their place. The crust falls off. In some cases, other symptoms can develop during a cold sore outbreak. These can include: Fever. Sore throat. Headache. Muscle aches. Swollen neck glands. How is this diagnosed? This condition is diagnosed based on your medical history and a physical exam. Your health care provider may do a blood test or may swab some fluid from your sore and then examine the swab in the lab. How is  this treated? There is no cure for cold sores or HSV-1. There is also no vaccine for HSV-1. Most cold sores go away on their own without treatment within 2 weeks. Medicines cannot make the infection go away, but your health care provider may prescribe medicines to: Help relieve some of the pain associated with the sores. Work to stop the virus from multiplying. Shorten healing time. Medicines may be in the form of creams, gels, pills, or a shot. Follow these instructions at home: Medicines Take or apply over-the-counter and prescription medicines only as told by your health care provider. Use a cotton-tip swab to apply creams or gels to your sores. Ask your health care provider if you can take lysine supplements. Research has found that lysine may help heal the cold sore faster and prevent outbreaks. Sore care  Do not touch the sores or pick the scabs. Wash your hands often with soap and water for at least 20 seconds. Do not touch your eyes without washing your hands first. Keep the sores clean and dry. If directed, put ice on the sores. To do this: Put ice in a plastic bag. Place a towel between your skin and the bag. Leave the ice on for 20 minutes, 2-3 times a day. Remove the ice if your skin turns bright red. This is very important. If you cannot feel pain, heat, or cold, you have a greater risk of damage to the area. Eating and drinking Eat a soft, bland diet. Avoid eating hot, cold, or salty foods.  Use a straw if it hurts to drink out of a glass. Eat foods that are rich in lysine, such as meat, fish, and dairy products. Avoid sugary foods, chocolates, nuts, and grains. These foods are rich in a nutrient called arginine, which can cause the virus to multiply. Lifestyle Do not kiss, have oral sex, or share personal items until your sores heal. Stress, poor sleep, and being out in the sun can trigger outbreaks. Make sure you: Do activities that help you relax, such as deep breathing  exercises or meditation. Get enough sleep. Apply sunscreen on your lips before you go out in the sun. Contact a health care provider if: You have symptoms for more than 2 weeks. You have pus coming from the sores. You have redness that is spreading. You have pain or irritation in your eye. You get sores on your genitals. Your sores do not heal within 2 weeks. You have frequent cold sore outbreaks. Get help right away if: You have a fever and your symptoms suddenly get worse. You have a headache and confusion. You have tiredness (fatigue) or loss of appetite. You have a stiff neck or sensitivity to light. Summary A cold sore, also called a fever blister, is a small, fluid-filled sore that forms inside the mouth or on the lips, gums, nose, chin, or cheeks. Most cold sores go away on their own without treatment within 2 weeks. Your health care provider may prescribe medicines to help relieve some of the pain, work to stop the virus from multiplying, and shorten healing time. Wash your hands often with soap and water for at least 20 seconds. Do not touch your eyes without washing your hands first. Do not kiss, have oral sex, or share personal items until your sores heal. Contact a health care provider if your sores do not heal within 2 weeks. This information is not intended to replace advice given to you by your health care provider. Make sure you discuss any questions you have with your health care provider. Document Revised: 12/07/2020 Document Reviewed: 12/07/2020 Elsevier Patient Education  2024 [employer].

## 2024-04-01 ENCOUNTER — Ambulatory Visit: Attending: Family Medicine

## 2024-04-01 ENCOUNTER — Other Ambulatory Visit: Payer: Self-pay

## 2024-04-01 DIAGNOSIS — M6281 Muscle weakness (generalized): Secondary | ICD-10-CM | POA: Insufficient documentation

## 2024-04-01 DIAGNOSIS — M545 Low back pain, unspecified: Secondary | ICD-10-CM | POA: Diagnosis not present

## 2024-04-01 DIAGNOSIS — M5459 Other low back pain: Secondary | ICD-10-CM | POA: Insufficient documentation

## 2024-04-01 DIAGNOSIS — M546 Pain in thoracic spine: Secondary | ICD-10-CM | POA: Insufficient documentation

## 2024-04-01 DIAGNOSIS — R2689 Other abnormalities of gait and mobility: Secondary | ICD-10-CM | POA: Insufficient documentation

## 2024-04-01 DIAGNOSIS — G8929 Other chronic pain: Secondary | ICD-10-CM | POA: Diagnosis not present

## 2024-04-01 DIAGNOSIS — R293 Abnormal posture: Secondary | ICD-10-CM | POA: Diagnosis present

## 2024-04-01 NOTE — Therapy (Signed)
 " OUTPATIENT PHYSICAL THERAPY THORACOLUMBAR EVALUATION   Patient Name: Leslie Skinner MRN: [id number] DOB:Jul 20, 1995, 29 y.o., female Today's Date: 04/02/2024  END OF SESSION:  PT End of Session - 04/02/24 0922     Visit Number 1    Number of Visits 17    Date for Recertification  05/28/24    Authorization Type [place] MEDICAID [employer]    PT Start Time 1618    PT Stop Time 1700    PT Time Calculation (min) 42 min    Activity Tolerance Patient tolerated treatment well    Behavior During Therapy [facility] for tasks assessed/performed          Past Medical History:  Diagnosis Date   Acanthosis nigricans, acquired    Anemia in pregnancy, third trimester 10/21/2021   Anxiety state 06/12/2012   Chronic bilateral thoracic back pain 08/05/2018   Current mild episode of major depressive disorder 11/14/2017   GERD (gastroesophageal reflux disease)    GERD (gastroesophageal reflux disease)    Gestational diabetes    Goiter    History of postpartum depression 05/03/2019   Hypertension    LGSIL on Pap smear of cervix with +HRHPV on 06/15/21 06/26/2021   LGSIL on Pap smear of cervix with +HRHPV on 06/15/21 06/26/2021   [x]  Colpo - normal without biopsy    Migraine 11/08/2010   Not on meds at this time.   Migraines    Obesity    Obesity, Class III, BMI 40-49.9 (morbid obesity) (HCC) 08/23/2011   Body mass index is 41.73 kg/m.        Osteochondroma of lower leg    Pollen allergies    Pre-diabetes 06/12/2012   Prediabetes    Reports having DM as a child, but not when older? No records.  Normal/prediabetic A1Cs   Pregnancy induced hypertension    Scoliosis    Scoliosis 11/22/2010   Spinal headache    Supervision of other high risk pregnancy, antepartum 06/12/2022              Nursing Staff    Provider      Office Location    [facility]    Dating     12/19/2022, by Last Menstrual Period      [facility] Model    Traditional                Language     English    Anatomy US              Flu Vaccine          Genetic/Carrier Screen     NIPS: low risk female  AFP:     [place]:       TDaP Vaccine          Hgb A1C or   GTT    Early -   Third trimester -       COVID Vaccine          Past Surgical History:  Procedure Laterality Date   LOWER LEG SOFT TISSUE TUMOR EXCISION  2011   OSTEOCHONDROMA EXCISION     Patient Active Problem List   Diagnosis Date Noted   Encounter for induction of labor 12/09/2022   Group B Streptococcus carrier, +RV culture, currently pregnant 12/03/2022   Gestational diabetes mellitus (GDM) affecting pregnancy-Insulin  09/25/2022   BMI 45.0-49.9, adult (HCC) 06/25/2022   Hx of shoulder dystocia in prior pregnancy, currently pregnant 06/25/2022   Chronic hypertension affecting pregnancy 12/29/2021   Maternal morbid  obesity, antepartum (HCC) 06/15/2021   Previous pregnancy with congenital heart defect, currently pregnant (TOF) 06/15/2021   Supervision of high risk pregnancy, antepartum 05/31/2021   SVD (spontaneous vaginal delivery) 05/03/2019    PCP: Romelle Booty, MD   REFERRING PROVIDER: Donzetta Rollene BRAVO, MD   REFERRING DIAG: M54.50,G89.29 (ICD-10-CM) - Chronic low back pain, unspecified back pain laterality, unspecified whether sciatica present   Rationale for Evaluation and Treatment: Rehabilitation  THERAPY DIAG:  Other low back pain  Pain in thoracic spine  Muscle weakness (generalized)  Abnormal posture  Other abnormalities of gait and mobility  ONSET DATE: Chronic  SUBJECTIVE:                                                                                                                                                                                           SUBJECTIVE STATEMENT: Pt presents to PT with reports of chronic back pain and discomfort, specifically low and mid. Denies overt trauma or MOI, was diagnosed with scoliosis as a child and feels like her symptoms have gotten worse after her 4 pregnancies. Denies referral of  symptoms into LE or UE, also denies N/T. Has not had any recent imaging for her spine.   PERTINENT HISTORY:  See PMH  PAIN:  Are you having pain?  Yes: NPRS scale: 4/10 Worst: 10/10 Pain location: upper/middle/lower back Pain description: sharp, sore Aggravating factors: standing, fwd bending  Relieving factors: medication  PRECAUTIONS: None  RED FLAGS: None   WEIGHT BEARING RESTRICTIONS: No  FALLS:  Has patient fallen in last 6 months? No  LIVING ENVIRONMENT: Lives with: lives with their family Lives in: House/apartment  OCCUPATION: Home Health Aide  PLOF: Independent  PATIENT GOALS: decrease back pain, improve comfort with lifting her children  OBJECTIVE:  Note: Objective measures were completed at Evaluation unless otherwise noted.  DIAGNOSTIC FINDINGS:  N/A  PATIENT SURVEYS:  Modified Oswestry:  MODIFIED OSWESTRY DISABILITY SCALE  Date: 04/01/2024 Score  Pain intensity 2 =  Pain medication provides me with complete relief from pain.  2. Personal care (washing, dressing, etc.) 1 =  I can take care of myself normally, but it increases my pain.  3. Lifting 1 = I can lift heavy weights, but it causes increased pain.  4. Walking 1 = Pain prevents me from walking more than 1 mile.  5. Sitting 2 =  Pain prevents me from sitting more than 1 hour.  6. Standing 1 =  I can stand as long as I want but, it increases my pain.  7. Sleeping 1 = I can sleep well only by using pain medication.  8. Social  Life 2 = Pain prevents me from participating in more energetic activities (eg. sports, dancing).  9. Traveling 1 =  I can travel anywhere, but it increases my pain.  10. Employment/ Homemaking 2 = I can perform most of my homemaking/job duties, but pain prevents me from performing more physically stressful activities (eg, lifting, vacuuming).  Total 14/50   Interpretation of scores: Score Category Description  0-20% Minimal Disability The patient can cope with most living  activities. Usually no treatment is indicated apart from advice on lifting, sitting and exercise  21-40% Moderate Disability The patient experiences more pain and difficulty with sitting, lifting and standing. Travel and social life are more difficult and they may be disabled from work. Personal care, sexual activity and sleeping are not grossly affected, and the patient can usually be managed by conservative means  41-60% Severe Disability Pain remains the main problem in this group, but activities of daily living are affected. These patients require a detailed investigation  61-80% Crippled Back pain impinges on all aspects of the patients life. Positive intervention is required  81-100% Bed-bound These patients are either bed-bound or exaggerating their symptoms  Bluford FORBES Zoe DELENA Karon DELENA, et al. Surgery versus conservative management of stable thoracolumbar fracture: the PRESTO feasibility RCT. [place]): [employer]; 2021 Nov. [facility] Technology Assessment, No. 25.62.) Appendix 3, Oswestry Disability Index category descriptors. Available from: Findjewelers.cz  Minimally Clinically Important Difference (MCID) = 12.8%  COGNITION: Overall cognitive status: Within functional limits for tasks assessed     SENSATION: [facility]  MUSCLE LENGTH: Thomas test: Right (+); Left (+)  POSTURE: rounded shoulders, forward head, and increased lumbar lordosis - no rib hump noted with fwd bending  PALPATION: TTP to bilateral lumbar paraspinals, lumbar hypomobility to spring tetsing  LUMBAR ROM:   AROM eval  Flexion 50% p!  Extension 75% improved  Right lateral flexion   Left lateral flexion   Right rotation WFL  Left rotation WFL   (Blank rows = not tested)  LOWER EXTREMITY MMT:    MMT Right eval Left eval  Hip flexion 4 4  Hip extension 3+ 3+  Hip abduction 3+ 3+  Hip adduction    Hip internal rotation    Hip external rotation    Knee  flexion    Knee extension    Ankle dorsiflexion    Ankle plantarflexion    Ankle inversion    Ankle eversion     (Blank rows = not tested)  LUMBAR SPECIAL TESTS:  Straight leg raise test: Negative and Slump test: Negative  FUNCTIONAL TESTS:  30 Second Sit to Stand: 6 reps no UE  GAIT: Distance walked: 35ft Assistive device utilized: None Level of assistance: Complete Independence Comments: flexed trunk, slowed gait speed  TREATMENT: [facility] Adult PT Treatment:                                                DATE: 04/02/2024 Therapeutic Exercise: LTR x 5 - 5 hold Modified thomas stretch x 60  Supine PPT x 5 - 5 hold Bridge x 5 - 5 hold  PATIENT EDUCATION:  Education details: eval findings, ODI, HEP, POC Person educated: Patient Education method: Explanation, Demonstration, and Handouts Education comprehension: verbalized understanding and returned demonstration  HOME EXERCISE PROGRAM: Access Code: [email or web address] URL: [email or web address] Date: 04/01/2024 Prepared by: Alm Kingdom  Exercises -  Supine Lower Trunk Rotation  - 1 x daily - 7 x weekly - 1-2 sets - 10 reps - 5 sec hold - Modified Thomas Stretch  - 1 x daily - 7 x weekly - 2-3 reps - 60 sec hold - Supine Posterior Pelvic Tilt  - 1 x daily - 7 x weekly - 2-3 sets - 10 reps - 5 sec hold - Supine Bridge  - 1 x daily - 7 x weekly - 3 sets - 10 reps  ASSESSMENT:  CLINICAL IMPRESSION: Patient is a 29 y.o. F who was seen today for physical therapy evaluation and treatment for chronic back pain. Physical findings are consistent with physician impression as pt demonstrates core/hip and postural weakness and functional mobility deficits. ODI score demonstrates moderate disability in performance of home ADLs and community activities. Pt would benefit from skilled PT services working on improve strength and mobility in order to decrease back pain and improve function.   OBJECTIVE IMPAIRMENTS: decreased activity  tolerance, decreased endurance, decreased mobility, difficulty walking, decreased ROM, decreased strength, and pain  ACTIVITY LIMITATIONS: carrying, lifting, standing, squatting, stairs, transfers, and locomotion level  PARTICIPATION LIMITATIONS: driving, shopping, community activity, occupation, and yard work  PERSONAL FACTORS: Time since onset of injury/illness/exacerbation are also affecting patient's functional outcome.   REHAB POTENTIAL: Good  CLINICAL DECISION MAKING: Stable/uncomplicated  EVALUATION COMPLEXITY: Low   GOALS: Goals reviewed with patient? No  SHORT TERM GOALS: Target date: 04/22/2024   Pt will be compliant and knowledgeable with initial HEP for improved comfort and carryover Baseline: initial HEP given  Goal status: INITIAL  2.  Pt will self report back pain no greater than 8/10 for improved comfort and functional ability Baseline: 10/10 at worst Goal status: INITIAL   LONG TERM GOALS: Target date: 05/27/2024   Pt will be decrease ODI disability score to no greater than 20% as proxy for functional improvement Baseline: 28% disability  Goal status: INITIAL  2.  Pt will self report back pain no greater than 6/10 for improved comfort and functional ability Baseline: 10/10 at worst Goal status: INITIAL   3.  Pt will increase 30 Second Sit to Stand rep count to no less than 10 reps for improved balance, strength, and functional mobility Baseline: 6 reps  Goal status: INITIAL   4.  Pt will be able to squat and lift 25# KB without increase in back pain for improved comfort with lifting her children Baseline: unable Goal status: INITIAL   PLAN:  PT FREQUENCY: 1-2x/week  PT DURATION: 8 weeks  PLANNED INTERVENTIONS: 97164- PT Re-evaluation, 97110-Therapeutic exercises, 97530- Therapeutic activity, [id number]- Neuromuscular re-education, 97535- Self Care, [id number]- Manual therapy, [id number]- Gait training, [id number]- Electrical stimulation (unattended), [id number]- Electrical  stimulation (manual), 20560 (1-2 muscles), 20561 (3+ muscles)- Dry Needling, and Patient/Family education  PLAN FOR NEXT SESSION: assess HEP response, core/hip strengthening, lifting mechanics   For all possible CPT codes, reference the Planned Interventions line above.     Check all conditions that are expected to impact treatment: {Conditions expected to impact treatment:Musculoskeletal disorders   If treatment provided at initial evaluation, no treatment charged due to lack of authorization.       Alm JAYSON Kingdom, PT 04/02/2024, 9:24 AM  "

## 2024-04-22 ENCOUNTER — Ambulatory Visit

## 2024-04-29 ENCOUNTER — Ambulatory Visit

## 2024-05-04 ENCOUNTER — Ambulatory Visit

## 2024-05-06 ENCOUNTER — Ambulatory Visit
